# Patient Record
Sex: Female | Born: 1965 | Race: White | Hispanic: No | Smoking: Former smoker
Health system: Southern US, Community
[De-identification: ages and names within clinical notes are randomized; demographics above are authoritative.]

## PROBLEM LIST (undated history)

## (undated) DIAGNOSIS — C189 Malignant neoplasm of colon, unspecified: Secondary | ICD-10-CM

## (undated) DIAGNOSIS — Z8049 Family history of malignant neoplasm of other genital organs: Secondary | ICD-10-CM

## (undated) DIAGNOSIS — Z800 Family history of malignant neoplasm of digestive organs: Secondary | ICD-10-CM

## (undated) DIAGNOSIS — G893 Neoplasm related pain (acute) (chronic): Secondary | ICD-10-CM

## (undated) HISTORY — DX: Malignant neoplasm of colon, unspecified: C18.9

## (undated) HISTORY — DX: Family history of malignant neoplasm of other genital organs: Z80.49

## (undated) HISTORY — DX: Neoplasm related pain (acute) (chronic): G89.3

## (undated) HISTORY — DX: Family history of malignant neoplasm of digestive organs: Z80.0

---

## 2018-09-04 ENCOUNTER — Encounter: Payer: Self-pay | Admitting: Family Medicine

## 2018-09-04 ENCOUNTER — Other Ambulatory Visit: Payer: Self-pay

## 2018-09-04 ENCOUNTER — Ambulatory Visit: Payer: Self-pay | Admitting: Family Medicine

## 2018-09-04 VITALS — BP 112/78 | HR 80 | Temp 97.9°F | Ht 62.2 in | Wt 149.1 lb

## 2018-09-04 DIAGNOSIS — Z7689 Persons encountering health services in other specified circumstances: Secondary | ICD-10-CM

## 2018-09-04 DIAGNOSIS — K529 Noninfective gastroenteritis and colitis, unspecified: Secondary | ICD-10-CM

## 2018-09-04 NOTE — Progress Notes (Signed)
BP 112/78 (BP Location: Right Arm, Patient Position: Sitting, Cuff Size: Normal)   Pulse 80   Temp 97.9 F (36.6 C)   Ht 5' 2.2" (1.58 m)   Wt 149 lb 2 oz (67.6 kg)   SpO2 99%   BMI 27.10 kg/m    Subjective:    Patient ID: Anita Sullivan, female    DOB: 1966/02/15, 53 y.o.   MRN: [id number]  HPI: Anita Sullivan is a 53 y.o. female  Chief Complaint  Patient presents with  . Diarrhea    Extreme gas, painful, blood with mucus.   . Dizziness    Patient states that she get lightheaded at times, not often, but it does happen   Here today to establish care. States she's not had a physical in about 25 years. No known medical problems, not taking any medicines.   Has had issues with diarrhea, gas, bloating, blood and mucus in stools intermittently. Has tried diet changes, dairy seems to not agree with her but when she's cut that out things didn't get better. Has not tried any medications because she's afraid of the counter reaction. Has never had any testing done. Does not have insurance.   Relevant past medical, surgical, family and social history reviewed and updated as indicated. Interim medical history since our last visit reviewed. Allergies and medications reviewed and updated.  Review of Systems  Per HPI unless specifically indicated above     Objective:    BP 112/78 (BP Location: Right Arm, Patient Position: Sitting, Cuff Size: Normal)   Pulse 80   Temp 97.9 F (36.6 C)   Ht 5' 2.2" (1.58 m)   Wt 149 lb 2 oz (67.6 kg)   SpO2 99%   BMI 27.10 kg/m   Wt Readings from Last 3 Encounters:  09/04/18 149 lb 2 oz (67.6 kg)    Physical Exam Vitals signs and nursing note reviewed.  Constitutional:      Appearance: Normal appearance. She is not ill-appearing.  HENT:     Head: Atraumatic.  Eyes:     Extraocular Movements: Extraocular movements intact.     Conjunctiva/sclera: Conjunctivae normal.  Neck:     Musculoskeletal: Normal range of motion and neck supple.    Cardiovascular:     Rate and Rhythm: Normal rate and regular rhythm.     Heart sounds: Normal heart sounds.  Pulmonary:     Effort: Pulmonary effort is normal.     Breath sounds: Normal breath sounds.  Abdominal:     General: Bowel sounds are normal.     Palpations: Abdomen is soft.     Tenderness: There is no abdominal tenderness. There is no right CVA tenderness, left CVA tenderness or guarding.  Musculoskeletal: Normal range of motion.  Skin:    General: Skin is warm and dry.  Neurological:     Mental Status: She is alert and oriented to person, place, and time.  Psychiatric:        Mood and Affect: Mood normal.        Thought Content: Thought content normal.        Judgment: Judgment normal.    No results found for this or any previous visit.    Assessment & Plan:   Problem List Items Addressed This Visit    None    Visit Diagnoses    Chronic diarrhea    -  Primary   Pt concerned about costs. CBC today, referral placed to GI for as conservative a workup  as possible. Due for colonoscopy as well   Relevant Orders   Ambulatory referral to Gastroenterology   CBC with Differential/Platelet   Encounter to establish care           Follow up plan: Return for CPE.

## 2018-09-05 LAB — CBC WITH DIFFERENTIAL/PLATELET
Basophils Absolute: 0 10*3/uL (ref 0.0–0.2)
Basos: 1 %
EOS (ABSOLUTE): 0.2 10*3/uL (ref 0.0–0.4)
Eos: 2 %
Hematocrit: 37.4 % (ref 34.0–46.6)
Hemoglobin: 13 g/dL (ref 11.1–15.9)
Immature Grans (Abs): 0 10*3/uL (ref 0.0–0.1)
Immature Granulocytes: 0 %
Lymphocytes Absolute: 1.9 10*3/uL (ref 0.7–3.1)
Lymphs: 24 %
MCH: 32 pg (ref 26.6–33.0)
MCHC: 34.8 g/dL (ref 31.5–35.7)
MCV: 92 fL (ref 79–97)
Monocytes Absolute: 0.5 10*3/uL (ref 0.1–0.9)
Monocytes: 6 %
NEUTROS PCT: 67 %
Neutrophils Absolute: 5.2 10*3/uL (ref 1.4–7.0)
Platelets: 331 10*3/uL (ref 150–450)
RBC: 4.06 x10E6/uL (ref 3.77–5.28)
RDW: 11.9 % (ref 11.7–15.4)
WBC: 7.8 10*3/uL (ref 3.4–10.8)

## 2018-09-11 ENCOUNTER — Encounter: Payer: Self-pay | Admitting: Family Medicine

## 2018-09-11 ENCOUNTER — Ambulatory Visit (INDEPENDENT_AMBULATORY_CARE_PROVIDER_SITE_OTHER): Payer: Self-pay | Admitting: Family Medicine

## 2018-09-11 NOTE — Progress Notes (Signed)
Used shared decision making today to forgo a physical or OV given self pay status - she is actively pursuing coverage currently and will call back if having any new issues or once she obtains coverage in order to get caught up on health maintenance items.

## 2018-09-15 ENCOUNTER — Encounter: Payer: Self-pay | Admitting: *Deleted

## 2020-05-22 ENCOUNTER — Emergency Department: Payer: Medicaid Other

## 2020-05-22 ENCOUNTER — Encounter: Payer: Self-pay | Admitting: Intensive Care

## 2020-05-22 ENCOUNTER — Inpatient Hospital Stay
Admission: EM | Admit: 2020-05-22 | Discharge: 2020-06-01 | DRG: 357 | Disposition: A | Discharge status: Home / Self Care | Payer: Medicaid Other | Attending: Internal Medicine | Admitting: Internal Medicine

## 2020-05-22 ENCOUNTER — Other Ambulatory Visit: Payer: Self-pay

## 2020-05-22 DIAGNOSIS — Y838 Other surgical procedures as the cause of abnormal reaction of the patient, or of later complication, without mention of misadventure at the time of the procedure: Secondary | ICD-10-CM | POA: Diagnosis not present

## 2020-05-22 DIAGNOSIS — Y92230 Patient room in hospital as the place of occurrence of the external cause: Secondary | ICD-10-CM | POA: Diagnosis not present

## 2020-05-22 DIAGNOSIS — N179 Acute kidney failure, unspecified: Secondary | ICD-10-CM | POA: Diagnosis not present

## 2020-05-22 DIAGNOSIS — C229 Malignant neoplasm of liver, not specified as primary or secondary: Secondary | ICD-10-CM | POA: Diagnosis not present

## 2020-05-22 DIAGNOSIS — D509 Iron deficiency anemia, unspecified: Secondary | ICD-10-CM | POA: Diagnosis present

## 2020-05-22 DIAGNOSIS — D649 Anemia, unspecified: Secondary | ICD-10-CM

## 2020-05-22 DIAGNOSIS — Z515 Encounter for palliative care: Secondary | ICD-10-CM

## 2020-05-22 DIAGNOSIS — K625 Hemorrhage of anus and rectum: Secondary | ICD-10-CM | POA: Diagnosis not present

## 2020-05-22 DIAGNOSIS — E872 Acidosis, unspecified: Secondary | ICD-10-CM

## 2020-05-22 DIAGNOSIS — K922 Gastrointestinal hemorrhage, unspecified: Secondary | ICD-10-CM | POA: Diagnosis not present

## 2020-05-22 DIAGNOSIS — N19 Unspecified kidney failure: Secondary | ICD-10-CM

## 2020-05-22 DIAGNOSIS — N1330 Unspecified hydronephrosis: Secondary | ICD-10-CM

## 2020-05-22 DIAGNOSIS — E8809 Other disorders of plasma-protein metabolism, not elsewhere classified: Secondary | ICD-10-CM

## 2020-05-22 DIAGNOSIS — N139 Obstructive and reflux uropathy, unspecified: Secondary | ICD-10-CM | POA: Diagnosis not present

## 2020-05-22 DIAGNOSIS — C787 Secondary malignant neoplasm of liver and intrahepatic bile duct: Secondary | ICD-10-CM | POA: Diagnosis present

## 2020-05-22 DIAGNOSIS — K529 Noninfective gastroenteritis and colitis, unspecified: Secondary | ICD-10-CM | POA: Diagnosis present

## 2020-05-22 DIAGNOSIS — Z20822 Contact with and (suspected) exposure to covid-19: Secondary | ICD-10-CM | POA: Diagnosis present

## 2020-05-22 DIAGNOSIS — Z452 Encounter for adjustment and management of vascular access device: Secondary | ICD-10-CM | POA: Diagnosis not present

## 2020-05-22 DIAGNOSIS — R160 Hepatomegaly, not elsewhere classified: Secondary | ICD-10-CM

## 2020-05-22 DIAGNOSIS — C19 Malignant neoplasm of rectosigmoid junction: Principal | ICD-10-CM | POA: Diagnosis present

## 2020-05-22 DIAGNOSIS — C189 Malignant neoplasm of colon, unspecified: Secondary | ICD-10-CM

## 2020-05-22 DIAGNOSIS — J95811 Postprocedural pneumothorax: Secondary | ICD-10-CM | POA: Diagnosis not present

## 2020-05-22 DIAGNOSIS — Z95828 Presence of other vascular implants and grafts: Secondary | ICD-10-CM

## 2020-05-22 DIAGNOSIS — Z419 Encounter for procedure for purposes other than remedying health state, unspecified: Secondary | ICD-10-CM

## 2020-05-22 DIAGNOSIS — J939 Pneumothorax, unspecified: Secondary | ICD-10-CM

## 2020-05-22 DIAGNOSIS — C20 Malignant neoplasm of rectum: Secondary | ICD-10-CM | POA: Diagnosis not present

## 2020-05-22 DIAGNOSIS — D62 Acute posthemorrhagic anemia: Secondary | ICD-10-CM | POA: Diagnosis present

## 2020-05-22 DIAGNOSIS — D631 Anemia in chronic kidney disease: Secondary | ICD-10-CM | POA: Diagnosis not present

## 2020-05-22 DIAGNOSIS — N2581 Secondary hyperparathyroidism of renal origin: Secondary | ICD-10-CM | POA: Diagnosis not present

## 2020-05-22 DIAGNOSIS — K6289 Other specified diseases of anus and rectum: Secondary | ICD-10-CM | POA: Diagnosis not present

## 2020-05-22 LAB — COMPREHENSIVE METABOLIC PANEL
ALT: 9 U/L (ref 0–44)
AST: 14 U/L — ABNORMAL LOW (ref 15–41)
Albumin: 3 g/dL — ABNORMAL LOW (ref 3.5–5.0)
Alkaline Phosphatase: 62 U/L (ref 38–126)
Anion gap: 16 — ABNORMAL HIGH (ref 5–15)
BUN: 83 mg/dL — ABNORMAL HIGH (ref 6–20)
CO2: 16 mmol/L — ABNORMAL LOW (ref 22–32)
Calcium: 8.1 mg/dL — ABNORMAL LOW (ref 8.9–10.3)
Chloride: 103 mmol/L (ref 98–111)
Creatinine, Ser: 9.82 mg/dL — ABNORMAL HIGH (ref 0.44–1.00)
GFR, Estimated: 4 mL/min — ABNORMAL LOW (ref >60)
Glucose, Bld: 108 mg/dL — ABNORMAL HIGH (ref 70–99)
Potassium: 4.1 mmol/L (ref 3.5–5.1)
Sodium: 135 mmol/L (ref 135–145)
Total Bilirubin: 0.5 mg/dL (ref 0.3–1.2)
Total Protein: 7.7 g/dL (ref 6.5–8.1)

## 2020-05-22 LAB — CBC
HCT: 20 % — ABNORMAL LOW (ref 36.0–46.0)
Hemoglobin: 6.4 g/dL — ABNORMAL LOW (ref 12.0–15.0)
MCH: 24.2 pg — ABNORMAL LOW (ref 26.0–34.0)
MCHC: 32 g/dL (ref 30.0–36.0)
MCV: 75.8 fL — ABNORMAL LOW (ref 80.0–100.0)
Platelets: 456 10*3/uL — ABNORMAL HIGH (ref 150–400)
RBC: 2.64 MIL/uL — ABNORMAL LOW (ref 3.87–5.11)
RDW: 15.9 % — ABNORMAL HIGH (ref 11.5–15.5)
WBC: 11.4 10*3/uL — ABNORMAL HIGH (ref 4.0–10.5)
nRBC: 0 % (ref 0.0–0.2)

## 2020-05-22 LAB — PROTIME-INR
INR: 1.2 (ref 0.8–1.2)
Prothrombin Time: 14.3 seconds (ref 11.4–15.2)

## 2020-05-22 LAB — RESPIRATORY PANEL BY RT PCR (FLU A&B, COVID)
Influenza A by PCR: NEGATIVE
Influenza B by PCR: NEGATIVE
SARS Coronavirus 2 by RT PCR: NEGATIVE

## 2020-05-22 LAB — LACTIC ACID, PLASMA: Lactic Acid, Venous: 1 mmol/L (ref 0.5–1.9)

## 2020-05-22 LAB — LIPASE, BLOOD: Lipase: 27 U/L (ref 11–51)

## 2020-05-22 LAB — PREPARE RBC (CROSSMATCH)

## 2020-05-22 LAB — ABO/RH: ABO/RH(D): A NEG

## 2020-05-22 MED ORDER — SODIUM CHLORIDE 0.9 % IV BOLUS: Freq: Once | INTRAVENOUS | Status: AC

## 2020-05-22 MED ORDER — PANTOPRAZOLE SODIUM 40 MG IV SOLR
40.0000 mg | Freq: Once | INTRAVENOUS | Status: AC
  Administered 2020-05-22: 40 mg via INTRAVENOUS
  Filled 2020-05-22: qty 40
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
## 2020-05-22 NOTE — ED Notes (Signed)
Assumed care of pt. With MD at bedside for rectal exam.

## 2020-05-22 NOTE — ED Provider Notes (Signed)
[facility] Emergency Department Provider Note  ____________________________________________   First MD Initiated Contact with Patient 05/22/20 1913     (approximate)  I have reviewed the triage vital signs and the nursing notes.   HISTORY  Chief Complaint Rectal Bleeding   HPI Anita Sullivan is a 54 y.o. female without significant past medical history who presents for assessment of bright red bleeding per rectum that has been intermittent over the past 2 months associate with some suprapubic abdominal pain rating to her bilateral flanks.  Patient states she has intermittently been passing bright red blood in her poop over the last couple weeks but that it happened once earlier this week and then again today where she was "gushing bright red blood into the toilet bowl".  She states she suspected she may have IBS that she sometimes has some epigastric discomfort after meals.  She denies significant NSAID use, EtOH use, tobacco abuse, illegal drug use.  Denies being anticoagulated.  No prior similar episodes.  No clear alleviating or aggravating factors.  She denies any headache, earache, sore throat, headedness, dizziness, chest pain, cough, shortness of breath, upper back pain, burning with urination, blood in her urine, rash, extremity pain, other acute complaints.  She denies any renal or GI history.         History reviewed. No pertinent past medical history.  Patient Active Problem List   Diagnosis Date Noted  . GI bleed 05/22/2020    History reviewed. No pertinent surgical history.  Prior to Admission medications   Medication Sig Start Date End Date Taking? Authorizing Provider  acetaminophen (TYLENOL) 500 MG tablet Take 1,000 mg by mouth every 6 (six) hours as needed.    [provider]    Allergies Patient has no known allergies.  Family History  Problem Relation Age of Onset  . Other Mother        Corticobasal degeneration  .  Cancer Mother        Uterine  . Diabetes Maternal Grandmother   . Stroke Maternal Grandfather   . Stroke Paternal Grandfather   . Heart disease Father     Social History Social History   Tobacco Use  . Smoking status: Never Smoker  . Smokeless tobacco: Never Used  Substance Use Topics  . Alcohol use: Never  . Drug use: Never    Review of Systems  Review of Systems  Constitutional: Negative for chills and fever.  HENT: Negative for sore throat.   Eyes: Negative for pain.  Respiratory: Negative for cough and stridor.   Cardiovascular: Negative for chest pain.  Gastrointestinal: Positive for abdominal pain and blood in stool. Negative for vomiting.  Genitourinary: Positive for flank pain. Negative for dysuria.  Musculoskeletal: Positive for back pain.  Skin: Negative for rash.  Neurological: Negative for seizures, loss of consciousness and headaches.  Psychiatric/Behavioral: Negative for suicidal ideas.  All other systems reviewed and are negative.     ____________________________________________   PHYSICAL EXAM:  VITAL SIGNS: ED Triage Vitals  Enc Vitals Group     BP 05/22/20 1536 132/72     Pulse Rate 05/22/20 1536 93     Resp 05/22/20 1536 20     Temp 05/22/20 1536 98.8 F (37.1 C)     Temp Source 05/22/20 1536 Oral     SpO2 05/22/20 1536 100 %     Weight 05/22/20 1537 130 lb (59 kg)     Height 05/22/20 1537 5' (1.524 m)  Head Circumference --      Peak Flow --      Pain Score 05/22/20 1539 7     Pain Loc --      Pain Edu? --      Excl. in [place]? --    Vitals:   05/22/20 2037 05/22/20 2100  BP: 124/69 124/79  Pulse: 87 86  Resp: 16 16  Temp: 98.4 F (36.9 C) 98.5 F (36.9 C)  SpO2: 100% 100%   Physical Exam Vitals and nursing note reviewed. Exam conducted with a chaperone present.  Constitutional:      General: She is not in acute distress.    Appearance: She is well-developed.  HENT:     Head: Normocephalic and atraumatic.     Right Ear:  External ear normal.     Left Ear: External ear normal.     Nose: Nose normal.  Eyes:     Conjunctiva/sclera: Conjunctivae normal.  Cardiovascular:     Rate and Rhythm: Normal rate and regular rhythm.     Heart sounds: No murmur heard.   Pulmonary:     Effort: Pulmonary effort is normal. No respiratory distress.     Breath sounds: Normal breath sounds.  Abdominal:     Palpations: Abdomen is soft.     Tenderness: There is abdominal tenderness in the suprapubic area. There is no right CVA tenderness or left CVA tenderness.  Genitourinary:    Rectum: No anal fissure, external hemorrhoid or internal hemorrhoid.     Comments: There is some bright red blood in the rectal vault.  No clear source on rectal exam. Musculoskeletal:     Cervical back: Neck supple.  Skin:    General: Skin is warm and dry.     Capillary Refill: Capillary refill takes less than 2 seconds.  Neurological:     Mental Status: She is alert and oriented to person, place, and time.  Psychiatric:        Mood and Affect: Mood normal.      ____________________________________________   LABS (all labs ordered are listed, but only abnormal results are displayed)  Labs Reviewed  COMPREHENSIVE METABOLIC PANEL - Abnormal; Notable for the following components:      Result Value   CO2 16 (*)    Glucose, Bld 108 (*)    BUN 83 (*)    Creatinine, Ser 9.82 (*)    Calcium 8.1 (*)    Albumin 3.0 (*)    AST 14 (*)    GFR, Estimated 4 (*)    Anion gap 16 (*)    All other components within normal limits  CBC - Abnormal; Notable for the following components:   WBC 11.4 (*)    RBC 2.64 (*)    Hemoglobin 6.4 (*)    HCT 20.0 (*)    MCV 75.8 (*)    MCH 24.2 (*)    RDW 15.9 (*)    Platelets 456 (*)    All other components within normal limits  RESPIRATORY PANEL BY RT PCR (FLU A&B, COVID)  GASTROINTESTINAL PANEL BY PCR, STOOL (REPLACES STOOL CULTURE)  C DIFFICILE QUICK SCREEN W PCR REFLEX  LACTIC ACID, PLASMA    LIPASE, BLOOD  LACTIC ACID, PLASMA  URINALYSIS, COMPLETE (UACMP) WITH MICROSCOPIC  PROTIME-INR  POC OCCULT BLOOD, ED  TYPE AND SCREEN  PREPARE RBC (CROSSMATCH)  ABO/RH   ____________________________________________  ____________________________________  RADIOLOGY   Official radiology report(s): CT ABDOMEN PELVIS WO CONTRAST  Result Date: 05/22/2020 CLINICAL DATA:  Intermittent rectal bleeding. EXAM: CT ABDOMEN AND PELVIS WITHOUT CONTRAST TECHNIQUE: Multidetector CT imaging of the abdomen and pelvis was performed following the standard protocol without IV contrast. COMPARISON:  None. FINDINGS: Lower chest: No acute abnormality. Hepatobiliary: A 2.9 cm x 3.1 cm ill-defined area of heterogeneous low attenuation is seen within the lateral aspect of the liver dome (axial CT image 17, CT series number 2). An ill-defined, slightly exophytic 6.9 cm x 5.0 cm area of heterogeneous mildly decreased attenuation is seen within the posterior aspect of the right lobe of the liver. An ill-defined central area of low attenuation is noted within this region. No gallstones, gallbladder wall thickening, or biliary dilatation. Pancreas: Unremarkable. No pancreatic ductal dilatation or surrounding inflammatory changes. Spleen: Normal in size without focal abnormality. Adrenals/Urinary Tract: Adrenal glands are unremarkable. Kidneys are normal in size, without focal lesions. Marked severity bilateral hydronephrosis and hydroureter is seen. The urinary bladder is partially contracted and subsequently limited in evaluation. Stomach/Bowel: Stomach is within normal limits. Appendix appears normal. No evidence of bowel dilatation. There is marked severity thickening of the mid and distal sigmoid colon associated pericolonic inflammatory fat stranding is noted. Vascular/Lymphatic: No significant vascular findings are present. There is moderate severity para-aortic and aortocaval lymphadenopathy. The largest lymph node  measures approximately 1.7 cm x 1.7 cm along the para-aortic region at the level of the kidney. It should be noted that this area is limited in evaluation in the absence of intravenous contrast. A 2.8 cm x 2.4 cm heterogeneous soft tissue mass is seen along the posterior aspect of the distal left iliac vessels (axial CT images 68 through 74, CT series number 2). Numerous subcentimeter soft tissue nodules are seen within the mesentery of the pelvis, along the midline (axial CT images 58 through 66, CT series number 2). Numerous additional small soft tissue nodules are seen surrounding the distal sigmoid colon and rectosigmoid junction. Reproductive: The uterus is mildly enlarged. The uterine fundus is positioned to the left of midline within the anterior aspect of the pelvis. Other: No abdominal wall hernia or abnormality. A small amount of abdominal and pelvic fluid is noted. Musculoskeletal: No acute or significant osseous findings. IMPRESSION: 1. Marked severity sigmoid colitis. Due to the numerous areas of soft tissue nodularity within the mesentery in this region, an underlying neoplastic process cannot be excluded. Correlation with colonoscopy is recommended. 2. Heterogeneous liver masses, as described above, suspicious for an underlying neoplasm. MRI correlation is recommended. 3. Moderate severity para-aortic and aortocaval lymphadenopathy with an additional enlarged, irregular appearing lymph node seen along the posterior aspect of the distal left iliac vessels. This is also worrisome for sequelae associated with an underlying neoplasm. 4. Marked severity bilateral hydronephrosis and hydroureter which likely represents partial renal obstruction secondary to the inflammatory and suspected neoplastic process seen within the pelvis. Electronically Signed   By: Virgina Norfolk M.D.   On: 05/22/2020 20:26    ____________________________________________   PROCEDURES  Procedure(s) performed (including  Critical Care):  .Critical Care Performed by: Lucrezia Starch, MD Authorized by: Lucrezia Starch, MD   Critical care provider statement:    Critical care time (minutes):  45   Critical care time was exclusive of:  Separately billable procedures and treating other patients   Critical care was necessary to treat or prevent imminent or life-threatening deterioration of the following conditions:  Metabolic crisis (Gi bleed requiring blood transfusion )   Critical care was time spent personally by me on the following activities:  Discussions with consultants, evaluation of patient's response to treatment, examination of patient, ordering and performing treatments and interventions, ordering and review of laboratory studies, ordering and review of radiographic studies, pulse oximetry, re-evaluation of patient's condition, obtaining history from patient or surrogate and review of old charts     ____________________________________________   [place] / [place] / ED COURSE        Patient presents with past history exam for evaluation of bright red blood per rectum as well as some suprapubic discomfort rating to her back.  Patient is afebrile hemodynamically stable on arrival.  Exam as above remarkable for some suprapubic tenderness as well as bright red blood in the rectum without active bleeding.  Differential includes but is not limited to bleeding hemorrhoid, fissure, AVM or formation, bleeding diverticuli, polyp, malignancy.  Patient's back pain could also be related to pyelonephritis, kidney stone, and renal failure.  CBC obtained in triage remarkable for evidence of acute renal failure with a creatinine of 9.82 without any recent to compare to.  Patient also is noted to have an anion gap metabolic acidosis with bicarb of 16 and anion gap of 16.  This is likely related to her uremia with BUN of 83.  Patient has noted hypoalbumin anemic but otherwise has no significant  electrolyte or metabolic derangements.  CBC remarkable for hemoglobin of 6.4 compared to 13 obtained 1 year ago.  Patient has also noted to have an elevated white blood cell count 11.4 with unremarkable platelets.  Lactic acid is 1.  Lipase is 27.  CT findings concerning for likely malignant process causing severe colitis likely etiology of patient's lower GI bleeding.  In addition she is noted to have severe bilateral hydronephrosis likely causing significant postobstructive uropathy which is likely the etiology for her acute renal failure.  Patient potassium is within normal limits I suspect all of the above noted findings and CT have been progressing over several months.  Do not believe she requires emergent dialysis at this time.  Also have lower suspicion for acute infectious process at this time although we will also order urine and stool studies.  Given patient's hemoglobin of less than 7 she was transfused 1 unit of packed red blood cells while in the ED.  I will plan to admit to medicine service for further evaluation management.  ____________________________________________   FINAL CLINICAL IMPRESSION(S) / ED DIAGNOSES  Final diagnoses:  Gastrointestinal hemorrhage, unspecified gastrointestinal hemorrhage type  Malignant neoplasm of colon, unspecified part of colon ([place])  Acute renal failure, unspecified acute renal failure type ([place])  Obstructive uropathy  Uremia  Low hemoglobin  Metabolic acidosis  Hypoalbuminemia    Medications  sodium chloride 0.9 % bolus ( Intravenous New Bag/Given 05/22/20 2043)  pantoprazole (PROTONIX) injection 40 mg (40 mg Intravenous Given 05/22/20 1948)     ED Discharge Orders    None       Note:  This document was prepared using Dragon voice recognition software and may include unintentional dictation errors.   Lucrezia Starch, MD 05/22/20 2112

## 2020-05-22 NOTE — ED Triage Notes (Signed)
Patient reports intermittent rectal bleeding X2 months. Also c/o abdominal pain and radiates around to back

## 2020-05-23 ENCOUNTER — Ambulatory Visit
Admit: 2020-05-23 | Discharge: 2020-05-23 | Disposition: A | Discharge status: Home / Self Care | Payer: Medicaid Other | Attending: Internal Medicine | Admitting: Internal Medicine

## 2020-05-23 ENCOUNTER — Inpatient Hospital Stay: Payer: Medicaid Other

## 2020-05-23 DIAGNOSIS — E872 Acidosis, unspecified: Secondary | ICD-10-CM | POA: Insufficient documentation

## 2020-05-23 DIAGNOSIS — K922 Gastrointestinal hemorrhage, unspecified: Secondary | ICD-10-CM

## 2020-05-23 DIAGNOSIS — N1330 Unspecified hydronephrosis: Secondary | ICD-10-CM

## 2020-05-23 HISTORY — PX: IR NEPHROSTOMY PLACEMENT LEFT: IMG6063

## 2020-05-23 HISTORY — PX: IR NEPHROSTOMY PLACEMENT RIGHT: IMG6064

## 2020-05-23 LAB — BASIC METABOLIC PANEL
Anion gap: 15 (ref 5–15)
BUN: 87 mg/dL — ABNORMAL HIGH (ref 6–20)
CO2: 16 mmol/L — ABNORMAL LOW (ref 22–32)
Calcium: 8.3 mg/dL — ABNORMAL LOW (ref 8.9–10.3)
Chloride: 107 mmol/L (ref 98–111)
Creatinine, Ser: 10.74 mg/dL — ABNORMAL HIGH (ref 0.44–1.00)
GFR, Estimated: 4 mL/min — ABNORMAL LOW (ref >60)
Glucose, Bld: 100 mg/dL — ABNORMAL HIGH (ref 70–99)
Potassium: 4 mmol/L (ref 3.5–5.1)
Sodium: 138 mmol/L (ref 135–145)

## 2020-05-23 LAB — IRON AND TIBC
Iron: 45 ug/dL (ref 28–170)
Saturation Ratios: 20 % (ref 10.4–31.8)
TIBC: 223 ug/dL — ABNORMAL LOW (ref 250–450)
UIBC: 178 ug/dL

## 2020-05-23 LAB — URINALYSIS, COMPLETE (UACMP) WITH MICROSCOPIC
Bilirubin Urine: NEGATIVE
Glucose, UA: NEGATIVE mg/dL
Ketones, ur: NEGATIVE mg/dL
Nitrite: NEGATIVE
Protein, ur: 30 mg/dL — AB
Specific Gravity, Urine: 1.008 (ref 1.005–1.030)
pH: 5 (ref 5.0–8.0)

## 2020-05-23 LAB — CBC
HCT: 21.4 % — ABNORMAL LOW (ref 36.0–46.0)
Hemoglobin: 6.9 g/dL — ABNORMAL LOW (ref 12.0–15.0)
MCH: 24.8 pg — ABNORMAL LOW (ref 26.0–34.0)
MCHC: 32.2 g/dL (ref 30.0–36.0)
MCV: 77 fL — ABNORMAL LOW (ref 80.0–100.0)
Platelets: 425 10*3/uL — ABNORMAL HIGH (ref 150–400)
RBC: 2.78 MIL/uL — ABNORMAL LOW (ref 3.87–5.11)
RDW: 16.5 % — ABNORMAL HIGH (ref 11.5–15.5)
WBC: 9.8 10*3/uL (ref 4.0–10.5)
nRBC: 0 % (ref 0.0–0.2)

## 2020-05-23 LAB — RETICULOCYTES
Immature Retic Fract: 21.6 % — ABNORMAL HIGH (ref 2.3–15.9)
RBC.: 2.77 MIL/uL — ABNORMAL LOW (ref 3.87–5.11)
Retic Count, Absolute: 23.5 10*3/uL (ref 19.0–186.0)
Retic Ct Pct: 0.9 % (ref 0.4–3.1)

## 2020-05-23 LAB — LACTIC ACID, PLASMA: Lactic Acid, Venous: 0.6 mmol/L (ref 0.5–1.9)

## 2020-05-23 LAB — FERRITIN: Ferritin: 58 ng/mL (ref 11–307)

## 2020-05-23 LAB — FOLATE: Folate: 7.9 ng/mL (ref >5.9)

## 2020-05-23 LAB — VITAMIN B12: Vitamin B-12: 529 pg/mL (ref 180–914)

## 2020-05-23 LAB — HIV ANTIBODY (ROUTINE TESTING W REFLEX): HIV Screen 4th Generation wRfx: NONREACTIVE

## 2020-05-23 MED ORDER — PANTOPRAZOLE SODIUM 40 MG IV SOLR
40.0000 mg | Freq: Two times a day (BID) | INTRAVENOUS | Status: DC
  Administered 2020-05-23 – 2020-05-31 (×16): 40 mg via INTRAVENOUS
  Filled 2020-05-23 (×17): qty 40

## 2020-05-23 MED ORDER — CEFAZOLIN SODIUM-DEXTROSE 2-4 GM/100ML-% IV SOLN
2.0000 g | INTRAVENOUS | Status: AC
  Filled 2020-05-23: qty 100

## 2020-05-23 MED ORDER — SODIUM CHLORIDE 0.9% FLUSH
5.0000 mL | Freq: Three times a day (TID) | INTRAVENOUS | Status: DC
  Administered 2020-05-23: 5 mL

## 2020-05-23 MED ORDER — FENTANYL CITRATE (PF) 100 MCG/2ML IJ SOLN
INTRAMUSCULAR | Status: AC
  Filled 2020-05-23: qty 2

## 2020-05-23 MED ORDER — MIDAZOLAM HCL 5 MG/5ML IJ SOLN
INTRAMUSCULAR | Status: AC
  Filled 2020-05-23: qty 5

## 2020-05-23 MED ORDER — ONDANSETRON HCL 4 MG PO TABS: 4.0000 mg | ORAL_TABLET | Freq: Four times a day (QID) | ORAL | Status: DC | PRN

## 2020-05-23 MED ORDER — ONDANSETRON HCL 4 MG/2ML IJ SOLN
4.0000 mg | Freq: Four times a day (QID) | INTRAMUSCULAR | Status: DC | PRN
  Administered 2020-05-24 – 2020-05-29 (×3): 4 mg via INTRAVENOUS
  Filled 2020-05-23 (×3): qty 2

## 2020-05-23 MED ORDER — LACTATED RINGERS IV SOLN
INTRAVENOUS | Status: DC
  Administered 2020-05-26: 1000 mL via INTRAVENOUS

## 2020-05-23 MED ORDER — MIDAZOLAM HCL 2 MG/2ML IJ SOLN
INTRAMUSCULAR | Status: AC | PRN
  Administered 2020-05-23 (×4): 1 mg via INTRAVENOUS

## 2020-05-23 MED ORDER — FENTANYL CITRATE (PF) 100 MCG/2ML IJ SOLN
INTRAMUSCULAR | Status: AC | PRN
  Administered 2020-05-23 (×2): 50 ug via INTRAVENOUS

## 2020-05-23 MED ORDER — CEFAZOLIN SODIUM-DEXTROSE 2-4 GM/100ML-% IV SOLN
INTRAVENOUS | Status: AC
  Filled 2020-05-23: qty 100

## 2020-05-23 MED ORDER — CEFAZOLIN SODIUM-DEXTROSE 1-4 GM/50ML-% IV SOLN
INTRAVENOUS | Status: AC | PRN
  Administered 2020-05-23: 2 g via INTRAVENOUS

## 2020-05-23 MED ORDER — IODIXANOL 320 MG/ML IV SOLN
50.0000 mL | Freq: Once | INTRAVENOUS | Status: AC | PRN
  Administered 2020-05-23: 12:00:00 20 mL

## 2020-05-23 NOTE — ED Notes (Signed)
Pt resting comfortably at this time. Pt ate dinner with no difficulty. Pt denies any further needs at this time. No distress noted. Pt states pain is tolerable and describes it as sore. Daughter at bedside.

## 2020-05-23 NOTE — Progress Notes (Signed)
Patient clinically stable post Bilateral Nephrostomy tube placement per DR Pascal Lux, tolerated well. Vitals stable pre and post procedure.awake/alert and oriented post procedure. 10 FR bilateral tubes placed. Denies complaints at this time. Received Versed 4 mg along with Fentanyl 100 mcg IV for procedure. Report given to [facility] post procedure in specials. To return to ER to await bed placement for admission post recovery.

## 2020-05-23 NOTE — ED Notes (Signed)
Report to [facility]

## 2020-05-23 NOTE — ED Notes (Signed)
Pt resting comfortably at this time. NAD noted at this time. Call bell in reach.

## 2020-05-23 NOTE — ED Notes (Signed)
Pt taken to vascular for placement of bilateral neprh tubes.

## 2020-05-23 NOTE — ED Notes (Signed)
[place] at bedside for renal US. Daughter also at bedside.
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
## 2020-05-23 NOTE — ED Notes (Signed)
Pt changed pad and there was some clots noted. Pt denies BD pain or the feeling that she is having a rush from the rectal area.

## 2020-05-23 NOTE — ED Notes (Signed)
Pt assisted to restroom with minimal assist at this time. 824ml of blood tinged urine emptied from neph tubes.

## 2020-05-23 NOTE — ED Notes (Signed)
Pt is back at baseline. Pt states she would like to eat and/or drink something, MD states she can now eat. VSS. Daughter at bedside.

## 2020-05-23 NOTE — Consult Note (Signed)
Jonathon Bellows , MD [address] [place], [place], [place], [address] [address], [place], [place], [place], [address] Phone: [phone]  Fax: [phone]  Consultation  Referring Provider:   Dr Reesa Chew Primary Care Physician:  Volney American, [place] Primary Gastroenterologist:  None          Reason for Consultation:     GI bleed  Date of Admission:  05/22/2020 Date of Consultation:  05/23/2020         HPI:   Anita Sullivan is a 54 y.o. female admitted last night with rectal bleeding.  Ongoing for 3 months on and off.The emergency room her hemoglobin was noted to be 6.4 g with an MCV of 75.8 and a CMP demonstrated normal liver function tests but a creatinine of 9.82 and a CO2 of 16.Large amount of blood and leukocytes in the urine.This morning hemoglobin is yet to be checked  She Underwent a CT scan of the abdomen and pelvis that demonstrated marked severe sigmoid colitis numerous, numerous areas of soft tissue nodularity within the mesentery suggestive of an underlying neoplastic process that cannot be excluded heterogeneous liver masses noted suspicious for a neoplasm.  Moderately severe Faera aortic and aortocaval lymphadenopathy noted marked severe bilateral hydronephrosis and hydroureter representing partial renal obstruction.  She says that she has had rectal bleeding and bright red blood in her urine for a few months but never sought medical attention, has lost a lot of weight and has no family  history of cancer.  History reviewed. No pertinent past medical history.  History reviewed. No pertinent surgical history.  Prior to Admission medications   Medication Sig Start Date End Date Taking? Authorizing Provider  acetaminophen (TYLENOL) 500 MG tablet Take 1,000 mg by mouth every 6 (six) hours as needed. Patient not taking: Reported on 05/22/2020    [provider]    Family History  Problem Relation Age of Onset   Other Mother        Corticobasal  degeneration   Cancer Mother        Uterine   Diabetes Maternal Grandmother    Stroke Maternal Grandfather    Stroke Paternal Grandfather    Heart disease Father      Social History   Tobacco Use   Smoking status: Never Smoker   Smokeless tobacco: Never Used  Substance Use Topics   Alcohol use: Never   Drug use: Never    Allergies as of 05/22/2020   (No Known Allergies)    Review of Systems:    All systems reviewed and negative except where noted in HPI.   Physical Exam:  Vital signs in last 24 hours: Temp:  [98.4 F (36.9 C)-98.9 F (37.2 C)] 98.9 F (37.2 C) (10/21 2330) Pulse Rate:  [74-93] 78 (10/22 0800) Resp:  [16-20] 16 (10/21 2330) BP: (95-140)/(41-79) 95/58 (10/22 0800) SpO2:  [98 %-100 %] 98 % (10/22 0800) Weight:  [59 kg] 59 kg (10/21 1537)   General:   Pleasant, cooperative in NAD Head:  Normocephalic and atraumatic. Eyes:   No icterus.   Conjunctiva pink. PERRLA. Ears:  Normal auditory acuity. Neck:  Supple; no masses or thyroidomegaly Lungs: Respirations even and unlabored. Lungs clear to auscultation bilaterally.   No wheezes, crackles, or rhonchi.  Heart:  Regular rate and rhythm;  Without murmur, clicks, rubs or gallops Abdomen:  Soft, nondistended, mild generarlized tenderness, Normal bowel sounds. No appreciable masses or hepatomegaly.  No rebound or guarding.  Neurologic:  Alert and oriented x3;  grossly normal neurologically. Skin:  Intact without significant lesions or rashes. Cervical Nodes:  No significant cervical adenopathy. Psych:  Alert and cooperative. Normal affect.  LAB RESULTS: Recent Labs    05/22/20 1542  WBC 11.4*  HGB 6.4*  HCT 20.0*  PLT 456*   BMET Recent Labs    05/22/20 1542  NA 135  K 4.1  CL 103  CO2 16*  GLUCOSE 108*  BUN 83*  CREATININE 9.82*  CALCIUM 8.1*   LFT Recent Labs    05/22/20 1542  PROT 7.7  ALBUMIN 3.0*  AST 14*  ALT 9  ALKPHOS 62  BILITOT 0.5   PT/INR Recent Labs     05/22/20 2313  LABPROT 14.3  INR 1.2    STUDIES: CT ABDOMEN PELVIS WO CONTRAST  Result Date: 05/22/2020 CLINICAL DATA:  Intermittent rectal bleeding. EXAM: CT ABDOMEN AND PELVIS WITHOUT CONTRAST TECHNIQUE: Multidetector CT imaging of the abdomen and pelvis was performed following the standard protocol without IV contrast. COMPARISON:  None. FINDINGS: Lower chest: No acute abnormality. Hepatobiliary: A 2.9 cm x 3.1 cm ill-defined area of heterogeneous low attenuation is seen within the lateral aspect of the liver dome (axial CT image 17, CT series number 2). An ill-defined, slightly exophytic 6.9 cm x 5.0 cm area of heterogeneous mildly decreased attenuation is seen within the posterior aspect of the right lobe of the liver. An ill-defined central area of low attenuation is noted within this region. No gallstones, gallbladder wall thickening, or biliary dilatation. Pancreas: Unremarkable. No pancreatic ductal dilatation or surrounding inflammatory changes. Spleen: Normal in size without focal abnormality. Adrenals/Urinary Tract: Adrenal glands are unremarkable. Kidneys are normal in size, without focal lesions. Marked severity bilateral hydronephrosis and hydroureter is seen. The urinary bladder is partially contracted and subsequently limited in evaluation. Stomach/Bowel: Stomach is within normal limits. Appendix appears normal. No evidence of bowel dilatation. There is marked severity thickening of the mid and distal sigmoid colon associated pericolonic inflammatory fat stranding is noted. Vascular/Lymphatic: No significant vascular findings are present. There is moderate severity para-aortic and aortocaval lymphadenopathy. The largest lymph node measures approximately 1.7 cm x 1.7 cm along the para-aortic region at the level of the kidney. It should be noted that this area is limited in evaluation in the absence of intravenous contrast. A 2.8 cm x 2.4 cm heterogeneous soft tissue mass is seen along the  posterior aspect of the distal left iliac vessels (axial CT images 68 through 74, CT series number 2). Numerous subcentimeter soft tissue nodules are seen within the mesentery of the pelvis, along the midline (axial CT images 58 through 66, CT series number 2). Numerous additional small soft tissue nodules are seen surrounding the distal sigmoid colon and rectosigmoid junction. Reproductive: The uterus is mildly enlarged. The uterine fundus is positioned to the left of midline within the anterior aspect of the pelvis. Other: No abdominal wall hernia or abnormality. A small amount of abdominal and pelvic fluid is noted. Musculoskeletal: No acute or significant osseous findings. IMPRESSION: 1. Marked severity sigmoid colitis. Due to the numerous areas of soft tissue nodularity within the mesentery in this region, an underlying neoplastic process cannot be excluded. Correlation with colonoscopy is recommended. 2. Heterogeneous liver masses, as described above, suspicious for an underlying neoplasm. MRI correlation is recommended. 3. Moderate severity para-aortic and aortocaval lymphadenopathy with an additional enlarged, irregular appearing lymph node seen along the posterior aspect of the distal left iliac vessels. This is also worrisome  for sequelae associated with an underlying neoplasm. 4. Marked severity bilateral hydronephrosis and hydroureter which likely represents partial renal obstruction secondary to the inflammatory and suspected neoplastic process seen within the pelvis. Electronically Signed   By: Virgina Norfolk M.D.   On: 05/22/2020 20:26      Impression / Plan:   Anita Sullivan is a 54 y.o. y/o female I have asked to be seen for rectal bleeding.  Ongoing for over a few weeks.  Hemoglobin noted to be low with severe microcytosis indicating probably iron deficiency that suggest that this issue has been going on for a while probably weeks if not any longer.  Large amount of blood seen in the  urine as well.  CT scan suggestive of some form of metastatic neoplasm,   Unknown primary.  Also has bilateral hydronephrosis likely secondary to obstruction from metastasis, she had a metabolic acidosis and acute kidney injury.  Plan 1.  Check iron studies B12 folate and replace if low I suspect she will have severe iron deficiency replace iron  2.  Suggest to consult oncology.  We can consider a sigmoidoscopy once she is more stable with improvement in her AKI and metabolic acidosis.  If she is having acute severe bleeding in the interim would suggest intervention by vascular surgery if severe as it is most likely due to underlying possible malignant disease that is causing the bleed. 3.  Monitor CBC and transfuse as needed.  Thank you for involving me in the care of this patient.      LOS: 1 day   Jonathon Bellows, MD  05/23/2020, 9:46 AM

## 2020-05-23 NOTE — Procedures (Signed)
Pre Procedure Dx: Hydronephrosis Post Procedural Dx: Same  Successful [place] and fluoroscopic guided placement of a bilateral 10 Fr PCNs with end coiled and locked within the renal pelvis. Both PCNs connected to gravity bags.  EBL: Trace Complications: None immediate.  Ronny Bacon, MD Pager #: [phone]

## 2020-05-23 NOTE — ED Notes (Signed)
Patient assessed. Noted to be resting in bed and declined desire for pain medication at this time though pt is reporting soreness. Pt is lying on L side. Cardiac monitor is in place. Pt breathing easy and unlabored with symmetric chest rise and fall and pt is speaking in full sentences. Patient is alert and oriented x4 and follows commands appropriately. IVF are infusing. Patient's bed is low and locked with side rails raised x1 and call bell in reach. Patient is deemed a low fall risk and verbalizes she will ask for assistance when needed for the restroom. Patient's meal tray was at bedside upon RN arrival in room and tray was placed at R side of bed at level that patient can reach. RN ensured patients belongings are in reach as well. Pt noted to have bilateral nephrostomy tubes in place both of which are draining with pink tinged output. No clots noted at this time. Patient denies further needs at this time and states she will alert RN when pain medication is desired.

## 2020-05-23 NOTE — Progress Notes (Signed)
Pt returned to holding w bilat nephrostomy tubes. Blood tinged urine draining through patent  collection bags R and L.

## 2020-05-23 NOTE — ED Notes (Signed)
Report received and care assumed. Per report overridden medications- Versed, Fentanyl, and Ancef were administered while patient off the floor for nephrostomy tube placement. Medications remain undocumented in [facility].

## 2020-05-23 NOTE — Progress Notes (Addendum)
PROGRESS NOTE    Anita Sullivan  HYW:[id number] DOB: 1966-04-21 DOA: 05/22/2020 PCP: Volney American, PA-C   Brief Narrative:  Anita Sullivan is a 54 y.o. female admitted last night with rectal bleeding.  Ongoing for 3 months on and off.The emergency room her hemoglobin was noted to be 6.4 g with an MCV of 75.8. Creatinine of 9.82 and CO2 of 16. Large amount of blood and leukocytes in the urine. CT scan of the abdomen and pelvis that demonstrated marked severe sigmoid colitis numerous, numerous areas of soft tissue nodularity within the mesentery suggestive of an underlying neoplastic process that cannot be excluded heterogeneous liver masses noted suspicious for a neoplasm.  Moderately severe [place] aortic and aortocaval lymphadenopathy noted marked severe bilateral hydronephrosis and hydroureter representing partial renal obstruction. High concern for extensive malignancy. Patient has quite a bit of unintentional weight loss recently. No family history of malignancy. Multiple specialities were consulted this morning which include GI, urology, nephrology and oncology. She underwent emergent bilateral nephrostomy tube placement by IR and draining well now.   Subjective: Patient continued to feel mild diffuse abdominal pain and bloatedness. Also having some heaviness around CVA bilaterally. She was very concerned about the cost of this visit as she does not have insurance, recently enrolled with some insurance but it will not start till the next year.  Assessment & Plan:   Principal Problem:   GI bleed Active Problems:   AKI (acute kidney injury) ([place])   Obstructive uropathy   Colitis, acute  Lower GI bleed. Symptoms and imaging is concerning for extensive malignancy. Patient had unintentional weight loss. She was transfused with 2 units of PRBCs. No posttransfusion CBC yet. -Consult was placed for GI and oncology. -Monitor CBC. -Check anemia panel. -Transfuse if below  8.  Acute kidney injury. Most likely secondary to obstructive uropathy as evident on CT abdomen. Urology was also consulted and according to their advice IR was consulted for bilateral emergent nephrostomy tube placement which was done today. Patient draining well from nephrostomy tubes. Urology will see her later today. Nephrology consult was also placed. Monitor renal function. Avoid nephrotoxins.  Anion gap metabolic acidosis. Lactic acid within normal limit. Most likely secondary to severe acute AKI and GI bleed. Mentation okay. -Continue to monitor.  Objective: Vitals:   05/23/20 0330 05/23/20 0400 05/23/20 0800 05/23/20 1006  BP: 133/67 130/65 (!) 95/58 122/68  Pulse: 74 76 78 88  Resp:      Temp:      TempSrc:      SpO2: 99% 99% 98% 100%  Weight:      Height:        Intake/Output Summary (Last 24 hours) at 05/23/2020 1511 Last data filed at 05/22/2020 2040 Gross per 24 hour  Intake 290 ml  Output --  Net 290 ml   Filed Weights   05/22/20 1537  Weight: 59 kg    Examination:  General exam: Appears calm and comfortable  Respiratory system: Clear to auscultation. Respiratory effort normal. Cardiovascular system: S1 & S2 heard, RRR.  Gastrointestinal system: Soft, mild diffuse tenderness, nondistended, bowel sounds positive. Central nervous system: Alert and oriented. No focal neurological deficits.Symmetric 5 x 5 power. Extremities: No edema, no cyanosis, pulses intact and symmetrical. Psychiatry: Judgement and insight appear normal. Mood & affect appropriate.    DVT prophylaxis: SCDs Code Status: Full Family Communication: Discussed with patient. Who agrees to talk with daughter. Call daughter with no response. Disposition Plan:  Status is: Inpatient  Remains inpatient appropriate because:Inpatient level of care appropriate due to severity of illness   Dispo: The patient is from: Home              Anticipated d/c is to: Home              Anticipated d/c  date is: 3 days              Patient currently is not medically stable to d/c.   Consultants:   GI  IR  Oncology  Urology  Nephrology  Procedures:  Antimicrobials:   Data Reviewed: I have personally reviewed following labs and imaging studies  CBC: Recent Labs  Lab 05/22/20 1542  WBC 11.4*  HGB 6.4*  HCT 20.0*  MCV 75.8*  PLT 725*   Basic Metabolic Panel: Recent Labs  Lab 05/22/20 1542  NA 135  K 4.1  CL 103  CO2 16*  GLUCOSE 108*  BUN 83*  CREATININE 9.82*  CALCIUM 8.1*   GFR: Estimated Creatinine Clearance: 5.4 mL/min (A) (by C-G formula based on SCr of 9.82 mg/dL (H)). Liver Function Tests: Recent Labs  Lab 05/22/20 1542  AST 14*  ALT 9  ALKPHOS 62  BILITOT 0.5  PROT 7.7  ALBUMIN 3.0*   Recent Labs  Lab 05/22/20 1934  LIPASE 27   No results for input(s): AMMONIA in the last 168 hours. Coagulation Profile: Recent Labs  Lab 05/22/20 2313  INR 1.2   Cardiac Enzymes: No results for input(s): CKTOTAL, CKMB, CKMBINDEX, TROPONINI in the last 168 hours. BNP (last 3 results) No results for input(s): PROBNP in the last 8760 hours. HbA1C: No results for input(s): HGBA1C in the last 72 hours. CBG: No results for input(s): GLUCAP in the last 168 hours. Lipid Profile: No results for input(s): CHOL, HDL, LDLCALC, TRIG, CHOLHDL, LDLDIRECT in the last 72 hours. Thyroid Function Tests: No results for input(s): TSH, T4TOTAL, FREET4, T3FREE, THYROIDAB in the last 72 hours. Anemia Panel: No results for input(s): VITAMINB12, FOLATE, FERRITIN, TIBC, IRON, RETICCTPCT in the last 72 hours. Sepsis Labs: Recent Labs  Lab 05/22/20 1934 05/22/20 2312  LATICACIDVEN 1.0 0.6    Recent Results (from the past 240 hour(s))  Respiratory Panel by RT PCR (Flu A&B, Covid) - Nasopharyngeal Swab     Status: None   Collection Time: 05/22/20  7:34 PM   Specimen: Nasopharyngeal Swab  Result Value Ref Range Status   SARS Coronavirus 2 by RT PCR NEGATIVE NEGATIVE  Final    Comment: (NOTE) SARS-CoV-2 target nucleic acids are NOT DETECTED.  The SARS-CoV-2 RNA is generally detectable in upper respiratoy specimens during the acute phase of infection. The lowest concentration of SARS-CoV-2 viral copies this assay can detect is 131 copies/mL. A negative result does not preclude SARS-Cov-2 infection and should not be used as the sole basis for treatment or other patient management decisions. A negative result may occur with  improper specimen collection/handling, submission of specimen other than nasopharyngeal swab, presence of viral mutation(s) within the areas targeted by this assay, and inadequate number of viral copies (<131 copies/mL). A negative result must be combined with clinical observations, patient history, and epidemiological information. The expected result is Negative.  Fact Sheet for Patients:  PinkCheek.be  Fact Sheet for Healthcare Providers:  GravelBags.it  This test is no t yet approved or cleared by the [place] FDA and  has been authorized for detection and/or diagnosis of SARS-CoV-2 by FDA under an Emergency Use Authorization (EUA). This EUA will  remain  in effect (meaning this test can be used) for the duration of the COVID-19 declaration under Section 564(b)(1) of the Act, 21 U.S.C. section 360bbb-3(b)(1), unless the authorization is terminated or revoked sooner.     Influenza A by PCR NEGATIVE NEGATIVE Final   Influenza B by PCR NEGATIVE NEGATIVE Final    Comment: (NOTE) The Xpert Xpress SARS-CoV-2/FLU/RSV assay is intended as an aid in  the diagnosis of influenza from Nasopharyngeal swab specimens and  should not be used as a sole basis for treatment. Nasal washings and  aspirates are unacceptable for Xpert Xpress SARS-CoV-2/FLU/RSV  testing.  Fact Sheet for Patients: PinkCheek.be  Fact Sheet for Healthcare  Providers: GravelBags.it  This test is not yet approved or cleared by the [place] FDA and  has been authorized for detection and/or diagnosis of SARS-CoV-2 by  FDA under an Emergency Use Authorization (EUA). This EUA will remain  in effect (meaning this test can be used) for the duration of the  Covid-19 declaration under Section 564(b)(1) of the Act, 21  U.S.C. section 360bbb-3(b)(1), unless the authorization is  terminated or revoked. Performed at [facility], [address], [place], [place] [address]      Radiology Studies: CT ABDOMEN PELVIS WO CONTRAST  Result Date: 05/22/2020 CLINICAL DATA:  Intermittent rectal bleeding. EXAM: CT ABDOMEN AND PELVIS WITHOUT CONTRAST TECHNIQUE: Multidetector CT imaging of the abdomen and pelvis was performed following the standard protocol without IV contrast. COMPARISON:  None. FINDINGS: Lower chest: No acute abnormality. Hepatobiliary: A 2.9 cm x 3.1 cm ill-defined area of heterogeneous low attenuation is seen within the lateral aspect of the liver dome (axial CT image 17, CT series number 2). An ill-defined, slightly exophytic 6.9 cm x 5.0 cm area of heterogeneous mildly decreased attenuation is seen within the posterior aspect of the right lobe of the liver. An ill-defined central area of low attenuation is noted within this region. No gallstones, gallbladder wall thickening, or biliary dilatation. Pancreas: Unremarkable. No pancreatic ductal dilatation or surrounding inflammatory changes. Spleen: Normal in size without focal abnormality. Adrenals/Urinary Tract: Adrenal glands are unremarkable. Kidneys are normal in size, without focal lesions. Marked severity bilateral hydronephrosis and hydroureter is seen. The urinary bladder is partially contracted and subsequently limited in evaluation. Stomach/Bowel: Stomach is within normal limits. Appendix appears normal. No evidence of bowel dilatation. There is marked  severity thickening of the mid and distal sigmoid colon associated pericolonic inflammatory fat stranding is noted. Vascular/Lymphatic: No significant vascular findings are present. There is moderate severity para-aortic and aortocaval lymphadenopathy. The largest lymph node measures approximately 1.7 cm x 1.7 cm along the para-aortic region at the level of the kidney. It should be noted that this area is limited in evaluation in the absence of intravenous contrast. A 2.8 cm x 2.4 cm heterogeneous soft tissue mass is seen along the posterior aspect of the distal left iliac vessels (axial CT images 68 through 74, CT series number 2). Numerous subcentimeter soft tissue nodules are seen within the mesentery of the pelvis, along the midline (axial CT images 58 through 66, CT series number 2). Numerous additional small soft tissue nodules are seen surrounding the distal sigmoid colon and rectosigmoid junction. Reproductive: The uterus is mildly enlarged. The uterine fundus is positioned to the left of midline within the anterior aspect of the pelvis. Other: No abdominal wall hernia or abnormality. A small amount of abdominal and pelvic fluid is noted. Musculoskeletal: No acute or significant osseous findings. IMPRESSION: 1.  Marked severity sigmoid colitis. Due to the numerous areas of soft tissue nodularity within the mesentery in this region, an underlying neoplastic process cannot be excluded. Correlation with colonoscopy is recommended. 2. Heterogeneous liver masses, as described above, suspicious for an underlying neoplasm. MRI correlation is recommended. 3. Moderate severity para-aortic and aortocaval lymphadenopathy with an additional enlarged, irregular appearing lymph node seen along the posterior aspect of the distal left iliac vessels. This is also worrisome for sequelae associated with an underlying neoplasm. 4. Marked severity bilateral hydronephrosis and hydroureter which likely represents partial renal  obstruction secondary to the inflammatory and suspected neoplastic process seen within the pelvis. Electronically Signed   By: Virgina Norfolk M.D.   On: 05/22/2020 20:26   [place] Intraoperative  Result Date: 05/23/2020 CLINICAL DATA:  Ultrasound was provided for use by the ordering physician.  No provider Interpretation or professional fees incurred.    US RENAL  Result Date: 05/23/2020 CLINICAL DATA:  Acute kidney injury EXAM: RENAL / URINARY TRACT ULTRASOUND COMPLETE COMPARISON:  CT 05/22/2020 FINDINGS: Right Kidney: Renal measurements: 11.5 x 5.2 x 5.5 cm = volume: 170 mL. Mild renal cortical thinning with increased cortical echogenicity. Severe right hydronephrosis. The visualized right ureter is moderately dilated. No discrete shadowing stone or mass lesion identified. Left Kidney: Renal measurements: 12.5 x 5.2 x 4.7 cm = volume: 162 mL. Cortical thickness within normal limits. Increased renal cortical echogenicity. Moderate to severe hydronephrosis. Visualized left ureter is mildly dilated. No discrete shadowing stone or mass lesion identified. Bladder: Partially distended.  No definite abnormality. Other: Inferior right lower lobe solid intrahepatic mass measuring up to 6.0 cm. Irregular and heterogeneous appearance of the uterus is partially visualized on images within the pelvis, incompletely characterized. IMPRESSION: 1. Severe right hydroureteronephrosis. 2. Moderate to severe left hydronephrosis. 3. Increased renal cortical echogenicity bilaterally suggesting medical renal disease. 4. Very irregular appearance of visualized portion of the uterus noted on transabdominal imaging through the pelvis. Recommend dedicated pelvic ultrasound for further evaluation. 5. Solid 6.0 cm inferior right hepatic lobe mass most suggestive of metastatic disease based on the previous CT. Electronically Signed   By: Davina Poke D.O.   On: 05/23/2020 10:04   IR NEPHROSTOMY PLACEMENT LEFT  Result Date:  05/23/2020 INDICATION: Patient admitted with new diagnosis of malignancy of unknown etiology now with bilateral obstructive hydronephrosis. Request made for placement of bilateral percutaneous nephrostomy catheters for urinary diversion purposes. EXAM: 1. ULTRASOUND GUIDANCE FOR PUNCTURE OF THE BILATERAL RENAL COLLECTING SYSTEMS 2. BILATERAL PERCUTANEOUS NEPHROSTOMY TUBE PLACEMENT. COMPARISON:  CT abdomen and pelvis-05/22/2020 MEDICATIONS: Ancef 2 gm IV; The antibiotic was administered in an appropriate time frame prior to skin puncture. ANESTHESIA/SEDATION: Moderate (conscious) sedation was employed during this procedure. A total of Versed 4 mg and Fentanyl 100 mcg was administered intravenously. Moderate Sedation Time: 22 minutes. The patient's level of consciousness and vital signs were monitored continuously by radiology nursing throughout the procedure under my direct supervision. CONTRAST:  A total of 20 cc Visipaque 320 was administered into both renal collecting systems. FLUOROSCOPY TIME:  1 minute, 42 seconds (32.4 mGy) COMPLICATIONS: None immediate. PROCEDURE: The procedure, risks, benefits, and alternatives were explained to the patient. Questions regarding the procedure were encouraged and answered. The patient understands and consents to the procedure. A timeout was performed prior to the initiation of the procedure. The bilateral flanks were prepped and draped in the usual sterile fashion and a sterile drape was applied covering the operative field. A sterile gown and sterile gloves  were used for the procedure. Local anesthesia was provided with 1% Lidocaine with epinephrine. Attention was first paid towards placement of the left-sided percutaneous nephrostomy catheter. Ultrasound was used to localize the left kidney. Under direct ultrasound guidance, a 20 gauge needle was advanced into the renal collecting system. An ultrasound image documentation was performed. Access within the collecting system  was confirmed with the efflux of urine followed by limited contrast injection. Over a Nitrex wire, the tract was dilated with an Accustick stent. Next, under intermittent fluoroscopic guidance and over a short Amplatz wire, the track was dilated ultimately allowing placement of a 10-French percutaneous nephrostomy catheter which was advanced to the level of the renal pelvis where the coil was formed and locked. Contrast was injected and several spot fluoroscopic images were obtained in various obliquities. The catheter was secured at the skin with a Prolene retention suture and stat lock device and connected to a gravity bag was placed. The identical procedure was repeated for the contralateral right kidney allowing placement of a 10 French percutaneous nephrostomy catheter with end ultimately coiled and locked within the right renal pelvis. Dressings were applied. The patient tolerated procedure well without immediate postprocedural complication. 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 Under a combination of ultrasound and fluoroscopic guidance, a posterior inferior calix was targeted bilaterally allowing placement of bilateral 10 French percutaneous nephrostomy catheters with ends coiled and locked within the bilateral renal pelvises. Contrast injection confirmed appropriate positioning. IMPRESSION: Successful ultrasound and fluoroscopic guided placement of bilateral 10 French PCNs. Electronically Signed   By: Sandi Mariscal M.D.   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 Request made for placement of bilateral percutaneous nephrostomy catheters for urinary diversion purposes. EXAM: 1. ULTRASOUND  GUIDANCE FOR PUNCTURE OF THE BILATERAL RENAL COLLECTING SYSTEMS 2. BILATERAL PERCUTANEOUS NEPHROSTOMY TUBE PLACEMENT. COMPARISON:  CT abdomen and pelvis-05/22/2020 MEDICATIONS: Ancef 2 gm IV; The antibiotic was administered in an appropriate time frame prior to skin puncture. ANESTHESIA/SEDATION: Moderate (conscious) sedation was employed during this procedure. A total of Versed 4 mg and Fentanyl 100 mcg was administered intravenously. Moderate Sedation Time: 22 minutes. The patient's level of consciousness and vital signs were monitored continuously by radiology nursing throughout the procedure under my direct supervision. CONTRAST:  A total of 20 cc Visipaque 320 was administered into both renal collecting systems. FLUOROSCOPY TIME:  1 minute, 42 seconds (98.9 mGy) COMPLICATIONS: None immediate. PROCEDURE: The procedure, risks, benefits, and alternatives were explained to the patient. Questions regarding the procedure were encouraged and answered. The patient understands and consents to the procedure. A timeout was performed prior to the initiation of the procedure. The bilateral flanks were prepped and draped in the usual sterile fashion and a sterile drape was applied covering the operative field. A sterile gown and sterile gloves were used for the procedure. Local anesthesia was provided with 1% Lidocaine with epinephrine. Attention was first paid towards placement of the left-sided percutaneous nephrostomy catheter. Ultrasound was used to localize the left kidney. Under direct ultrasound guidance, a 20 gauge needle was advanced into the renal collecting system. An ultrasound image documentation was performed. Access within the collecting system was confirmed with the efflux of urine followed by limited contrast injection. Over a Nitrex wire, the tract was dilated  with an Accustick stent. Next, under intermittent fluoroscopic guidance and over a short Amplatz wire, the track was dilated ultimately allowing  placement of a 10-French percutaneous nephrostomy catheter which was advanced to the level of the renal pelvis where the coil was formed and locked. Contrast was injected and several spot fluoroscopic images were obtained in various obliquities. The catheter was secured at the skin with a Prolene retention suture and stat lock device and connected to a gravity bag was placed. The identical procedure was repeated for the contralateral right kidney allowing placement of a 10 French percutaneous nephrostomy catheter with end ultimately coiled and locked within the right renal pelvis. Dressings were applied. The patient tolerated procedure well without immediate postprocedural complication. 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 Under a combination of ultrasound and fluoroscopic guidance, a posterior inferior calix was targeted bilaterally allowing placement of bilateral 10 French percutaneous nephrostomy catheters with ends coiled and locked within the bilateral renal pelvises. Contrast injection confirmed appropriate positioning. IMPRESSION: Successful ultrasound and fluoroscopic guided placement of bilateral 10 French PCNs. Electronically Signed   By: Sandi Mariscal M.D.   On: 05/23/2020 12:55    Scheduled Meds:  fentaNYL       midazolam       pantoprazole (PROTONIX) IV  40 mg Intravenous Q12H   Continuous Infusions:  ceFAZolin      ceFAZolin (ANCEF) IV     lactated ringers 100 mL/hr at 05/23/20 0905     LOS: 1 day   Time spent: 50 minutes. More than 50% of the time was spent with direct patient care and coordinating with other specialities.  Lorella Nimrod, MD [facility]  If 7PM-7AM, please contact night-coverage [email or web address]  05/23/2020, 3:11 PM   This record has been created using Systems analyst. Errors have been  sought and corrected,but may not always be located. Such creation errors do not reflect on the standard of care.

## 2020-05-24 DIAGNOSIS — K625 Hemorrhage of anus and rectum: Secondary | ICD-10-CM

## 2020-05-24 DIAGNOSIS — N139 Obstructive and reflux uropathy, unspecified: Secondary | ICD-10-CM

## 2020-05-24 LAB — GASTROINTESTINAL PANEL BY PCR, STOOL (REPLACES STOOL CULTURE)

## 2020-05-24 LAB — C DIFFICILE QUICK SCREEN W PCR REFLEX
C Diff antigen: NEGATIVE
C Diff interpretation: NOT DETECTED
C Diff toxin: NEGATIVE

## 2020-05-24 LAB — CANCER ANTIGEN 27.29: CA 27.29: 10.1 U/mL (ref 0.0–38.6)

## 2020-05-24 LAB — RENAL FUNCTION PANEL
Albumin: 2.5 g/dL — ABNORMAL LOW (ref 3.5–5.0)
Anion gap: 11 (ref 5–15)
BUN: 84 mg/dL — ABNORMAL HIGH (ref 6–20)
CO2: 18 mmol/L — ABNORMAL LOW (ref 22–32)
Calcium: 7.8 mg/dL — ABNORMAL LOW (ref 8.9–10.3)
Chloride: 108 mmol/L (ref 98–111)
Creatinine, Ser: 9.89 mg/dL — ABNORMAL HIGH (ref 0.44–1.00)
GFR, Estimated: 4 mL/min — ABNORMAL LOW (ref >60)
Glucose, Bld: 115 mg/dL — ABNORMAL HIGH (ref 70–99)
Phosphorus: 6.8 mg/dL — ABNORMAL HIGH (ref 2.5–4.6)
Potassium: 4.1 mmol/L (ref 3.5–5.1)
Sodium: 137 mmol/L (ref 135–145)

## 2020-05-24 LAB — CBC
HCT: 19.4 % — ABNORMAL LOW (ref 36.0–46.0)
Hemoglobin: 6.4 g/dL — ABNORMAL LOW (ref 12.0–15.0)
MCH: 25.2 pg — ABNORMAL LOW (ref 26.0–34.0)
MCHC: 33 g/dL (ref 30.0–36.0)
MCV: 76.4 fL — ABNORMAL LOW (ref 80.0–100.0)
Platelets: 392 10*3/uL (ref 150–400)
RBC: 2.54 MIL/uL — ABNORMAL LOW (ref 3.87–5.11)
RDW: 16.8 % — ABNORMAL HIGH (ref 11.5–15.5)
WBC: 9.3 10*3/uL (ref 4.0–10.5)
nRBC: 0 % (ref 0.0–0.2)

## 2020-05-24 LAB — CANCER ANTIGEN 19-9: CA 19-9: 11 U/mL (ref 0–35)

## 2020-05-24 LAB — PREPARE RBC (CROSSMATCH)

## 2020-05-24 LAB — CEA: CEA: 9.9 ng/mL — ABNORMAL HIGH (ref 0.0–4.7)

## 2020-05-24 LAB — CA 125: Cancer Antigen (CA) 125: 25.1 U/mL (ref 0.0–38.1)

## 2020-05-24 MED ORDER — CALCIUM ACETATE (PHOS BINDER) 667 MG PO CAPS
667.0000 mg | ORAL_CAPSULE | Freq: Three times a day (TID) | ORAL | Status: DC
  Administered 2020-05-25 – 2020-06-01 (×14): 667 mg via ORAL
  Filled 2020-05-24 (×17): qty 1

## 2020-05-24 MED ORDER — ACETAMINOPHEN 325 MG PO TABS
650.0000 mg | ORAL_TABLET | Freq: Once | ORAL | Status: AC
  Administered 2020-05-24: 650 mg via ORAL
  Filled 2020-05-24: qty 2

## 2020-05-24 MED ORDER — SODIUM CHLORIDE 0.9 % IV SOLN
300.0000 mg | Freq: Once | INTRAVENOUS | Status: AC
  Administered 2020-05-24: 300 mg via INTRAVENOUS
  Filled 2020-05-24: qty 15

## 2020-05-24 MED ORDER — SODIUM BICARBONATE 650 MG PO TABS
650.0000 mg | ORAL_TABLET | Freq: Three times a day (TID) | ORAL | Status: DC
  Administered 2020-05-24 – 2020-06-01 (×18): 650 mg via ORAL
  Filled 2020-05-24 (×22): qty 1

## 2020-05-24 MED ORDER — DIPHENHYDRAMINE HCL 50 MG/ML IJ SOLN
25.0000 mg | Freq: Once | INTRAMUSCULAR | Status: AC
  Administered 2020-05-24: 25 mg via INTRAVENOUS
  Filled 2020-05-24: qty 1

## 2020-05-24 MED ORDER — SODIUM CHLORIDE 0.9 % IV SOLN: Freq: Once | INTRAVENOUS | Status: AC

## 2020-05-24 NOTE — ED Notes (Signed)
Patient up to bathroom independently

## 2020-05-24 NOTE — ED Notes (Signed)
Daughter at bedside.

## 2020-05-24 NOTE — ED Notes (Signed)
Pt ambulated to bathroom. States small amt urine, no BM, "only gas".

## 2020-05-24 NOTE — Progress Notes (Signed)
Cephas Darby, MD [address]  [place]  [place], [place] [address]  Main: [phone]  Fax: [phone] Pager: [phone]   Subjective: Patient had nephrostomy tubes placed by IR yesterday.  She reports abdominal pain is significantly improved.  She continues to notice rectal bleeding.  Her hemoglobin did not respond appropriately to blood transfusion.  She is also on maintenance IV fluids   Objective: Vital signs in last 24 hours: Vitals:   05/24/20 1230 05/24/20 1245 05/24/20 1350 05/24/20 1419  BP: 111/71 115/71 125/72 115/76  Pulse: 86 88 88 84  Resp: 20  18 16   Temp:   98 F (36.7 C) 98.3 F (36.8 C)  TempSrc:   Oral Oral  SpO2: 100% 100% 98% 100%  Weight:      Height:       Weight change:   Intake/Output Summary (Last 24 hours) at 05/24/2020 1429 Last data filed at 05/24/2020 1353 Gross per 24 hour  Intake 970 ml  Output 2800 ml  Net -1830 ml     Exam: Heart:: Regular rate and rhythm, S1S2 present or without murmur or extra heart sounds Lungs: normal and clear to auscultation Abdomen: soft, nontender, normal bowel sounds   Lab Results: CBC Latest Ref Rng & Units 05/24/2020 05/23/2020 05/22/2020  WBC 4.0 - 10.5 K/uL 9.3 9.8 11.4(H)  Hemoglobin 12.0 - 15.0 g/dL 6.4(L) 6.9(L) 6.4(L)  Hematocrit 36 - 46 % 19.4(L) 21.4(L) 20.0(L)  Platelets 150 - 400 K/uL 392 425(H) 456(H)   CMP Latest Ref Rng & Units 05/24/2020 05/23/2020 05/22/2020  Glucose 70 - 99 mg/dL 115(H) 100(H) 108(H)  BUN 6 - 20 mg/dL 84(H) 87(H) 83(H)  Creatinine 0.44 - 1.00 mg/dL 9.89(H) 10.74(H) 9.82(H)  Sodium 135 - 145 mmol/L 137 138 135  Potassium 3.5 - 5.1 mmol/L 4.1 4.0 4.1  Chloride 98 - 111 mmol/L 108 107 103  CO2 22 - 32 mmol/L 18(L) 16(L) 16(L)  Calcium 8.9 - 10.3 mg/dL 7.8(L) 8.3(L) 8.1(L)  Total Protein 6.5 - 8.1 g/dL - - 7.7  Total Bilirubin 0.3 - 1.2 mg/dL - - 0.5  Alkaline Phos 38 - 126 U/L - - 62  AST 15 - 41 U/L - - 14(L)  ALT 0 - 44 U/L - - 9    Micro  Results: Recent Results (from the past 240 hour(s))  Respiratory Panel by RT PCR (Flu A&B, Covid) - Nasopharyngeal Swab     Status: None   Collection Time: 05/22/20  7:34 PM   Specimen: Nasopharyngeal Swab  Result Value Ref Range Status   SARS Coronavirus 2 by RT PCR NEGATIVE NEGATIVE Final    Comment: (NOTE) SARS-CoV-2 target nucleic acids are NOT DETECTED.  The SARS-CoV-2 RNA is generally detectable in upper respiratoy specimens during the acute phase of infection. The lowest concentration of SARS-CoV-2 viral copies this assay can detect is 131 copies/mL. A negative result does not preclude SARS-Cov-2 infection and should not be used as the sole basis for treatment or other patient management decisions. A negative result may occur with  improper specimen collection/handling, submission of specimen other than nasopharyngeal swab, presence of viral mutation(s) within the areas targeted by this assay, and inadequate number of viral copies (<131 copies/mL). A negative result must be combined with clinical observations, patient history, and epidemiological information. The expected result is Negative.  Fact Sheet for Patients:  PinkCheek.be  Fact Sheet for Healthcare Providers:  GravelBags.it  This test is no t yet approved or cleared by the  [place] [employer]ural [employer] and  has been authorized for detection and/or diagnosis of SARS-CoV-2 by FDA under an Print production planner (EUA). This EUA will remain  in effect (meaning this test can be used) for the duration of the COVID-19 declaration under Section 564(b)(1) of the Act, 21 U.S.C. section 360bbb-3(b)(1), unless the authorization is terminated or revoked sooner.     Influenza A by PCR NEGATIVE NEGATIVE Final   Influenza B by PCR NEGATIVE NEGATIVE Final    Comment: (NOTE) The Xpert Xpress SARS-CoV-2/FLU/RSV assay is intended as an aid in  the diagnosis of influenza from  Nasopharyngeal swab specimens and  should not be used as a sole basis for treatment. Nasal washings and  aspirates are unacceptable for Xpert Xpress SARS-CoV-2/FLU/RSV  testing.  Fact Sheet for Patients: PinkCheek.be  Fact Sheet for Healthcare Providers: GravelBags.it  This test is not yet approved or cleared by the [place] FDA and  has been authorized for detection and/or diagnosis of SARS-CoV-2 by  FDA under an Emergency Use Authorization (EUA). This EUA will remain  in effect (meaning this test can be used) for the duration of the  Covid-19 declaration under Section 564(b)(1) of the Act, 21  U.S.C. section 360bbb-3(b)(1), unless the authorization is  terminated or revoked. Performed at [facility] [address] [place], [place] [address]   Gastrointestinal Panel by PCR , Stool     Status: None   Collection Time: 05/24/20  1:00 AM   Specimen: Stool  Result Value Ref Range Status   Campylobacter species NOT DETECTED NOT DETECTED Final   Plesimonas shigelloides NOT DETECTED NOT DETECTED Final   Salmonella species NOT DETECTED NOT DETECTED Final   Yersinia enterocolitica NOT DETECTED NOT DETECTED Final   Vibrio species NOT DETECTED NOT DETECTED Final   Vibrio cholerae NOT DETECTED NOT DETECTED Final   Enteroaggregative E coli (EAEC) NOT DETECTED NOT DETECTED Final   Enteropathogenic E coli (EPEC) NOT DETECTED NOT DETECTED Final   Enterotoxigenic E coli (ETEC) NOT DETECTED NOT DETECTED Final   Shiga like toxin producing E coli (STEC) NOT DETECTED NOT DETECTED Final   Shigella/Enteroinvasive E coli (EIEC) NOT DETECTED NOT DETECTED Final   Cryptosporidium NOT DETECTED NOT DETECTED Final   Cyclospora cayetanensis NOT DETECTED NOT DETECTED Final   Entamoeba histolytica NOT DETECTED NOT DETECTED Final   Giardia lamblia NOT DETECTED NOT DETECTED Final   Adenovirus F40/41 NOT DETECTED NOT DETECTED Final    Astrovirus NOT DETECTED NOT DETECTED Final   Norovirus GI/GII NOT DETECTED NOT DETECTED Final   Rotavirus A NOT DETECTED NOT DETECTED Final   Sapovirus (I, II, IV, and V) NOT DETECTED NOT DETECTED Final    Comment: Performed at [facility], [address] [place], [place] [address]  C Difficile Quick Screen w PCR reflex     Status: None   Collection Time: 05/24/20  1:00 AM   Specimen: Stool  Result Value Ref Range Status   C Diff antigen NEGATIVE NEGATIVE Final   C Diff toxin NEGATIVE NEGATIVE Final   C Diff interpretation No C. difficile detected.  Final    Comment: Performed at [facility], [address] [place], [place] [address]   Studies/Results: CT ABDOMEN PELVIS WO CONTRAST  Result Date: 05/22/2020 CLINICAL DATA:  Intermittent rectal bleeding. EXAM: CT ABDOMEN AND PELVIS WITHOUT CONTRAST TECHNIQUE: Multidetector CT imaging of the abdomen and pelvis was performed following the standard protocol without IV contrast. COMPARISON:  None. FINDINGS: Lower chest: No acute  abnormality. Hepatobiliary: A 2.9 cm x 3.1 cm ill-defined area of heterogeneous low attenuation is seen within the lateral aspect of the liver dome (axial CT image 17, CT series number 2). An ill-defined, slightly exophytic 6.9 cm x 5.0 cm area of heterogeneous mildly decreased attenuation is seen within the posterior aspect of the right lobe of the liver. An ill-defined central area of low attenuation is noted within this region. No gallstones, gallbladder wall thickening, or biliary dilatation. Pancreas: Unremarkable. No pancreatic ductal dilatation or surrounding inflammatory changes. Spleen: Normal in size without focal abnormality. Adrenals/Urinary Tract: Adrenal glands are unremarkable. Kidneys are normal in size, without focal lesions. Marked severity bilateral hydronephrosis and hydroureter is seen. The urinary bladder is partially contracted and subsequently limited in evaluation. Stomach/Bowel:  Stomach is within normal limits. Appendix appears normal. No evidence of bowel dilatation. There is marked severity thickening of the mid and distal sigmoid colon associated pericolonic inflammatory fat stranding is noted. Vascular/Lymphatic: No significant vascular findings are present. There is moderate severity para-aortic and aortocaval lymphadenopathy. The largest lymph node measures approximately 1.7 cm x 1.7 cm along the para-aortic region at the level of the kidney. It should be noted that this area is limited in evaluation in the absence of intravenous contrast. A 2.8 cm x 2.4 cm heterogeneous soft tissue mass is seen along the posterior aspect of the distal left iliac vessels (axial CT images 68 through 74, CT series number 2). Numerous subcentimeter soft tissue nodules are seen within the mesentery of the pelvis, along the midline (axial CT images 58 through 66, CT series number 2). Numerous additional small soft tissue nodules are seen surrounding the distal sigmoid colon and rectosigmoid junction. Reproductive: The uterus is mildly enlarged. The uterine fundus is positioned to the left of midline within the anterior aspect of the pelvis. Other: No abdominal wall hernia or abnormality. A small amount of abdominal and pelvic fluid is noted. Musculoskeletal: No acute or significant osseous findings. IMPRESSION: 1. Marked severity sigmoid colitis. Due to the numerous areas of soft tissue nodularity within the mesentery in this region, an underlying neoplastic process cannot be excluded. Correlation with colonoscopy is recommended. 2. Heterogeneous liver masses, as described above, suspicious for an underlying neoplasm. MRI correlation is recommended. 3. Moderate severity para-aortic and aortocaval lymphadenopathy with an additional enlarged, irregular appearing lymph node seen along the posterior aspect of the distal left iliac vessels. This is also worrisome for sequelae associated with an underlying  neoplasm. 4. Marked severity bilateral hydronephrosis and hydroureter which likely represents partial renal obstruction secondary to the inflammatory and suspected neoplastic process seen within the pelvis. Electronically Signed   By: Virgina Norfolk M.D.   On: 05/22/2020 20:26   [place] Intraoperative  Result Date: 05/23/2020 INDICATION: Patient admitted with new diagnosis of malignancy of unknown etiology now with bilateral obstructive hydronephrosis. Request made for placement of bilateral percutaneous nephrostomy catheters for urinary diversion purposes. EXAM: 1. ULTRASOUND GUIDANCE FOR PUNCTURE OF THE BILATERAL RENAL COLLECTING SYSTEMS 2. BILATERAL PERCUTANEOUS NEPHROSTOMY TUBE PLACEMENT. COMPARISON:  CT abdomen and pelvis-05/22/2020 MEDICATIONS: Ancef 2 gm IV; The antibiotic was administered in an appropriate time frame prior to skin puncture. ANESTHESIA/SEDATION: Moderate (conscious) sedation was employed during this procedure. A total of Versed 4 mg and Fentanyl 100 mcg was administered intravenously. Moderate Sedation Time: 22 minutes. The patient's level of consciousness and vital signs were monitored continuously by radiology nursing throughout the procedure under my direct supervision. CONTRAST:  A total of 20 cc Visipaque 320  was administered into both renal collecting systems. FLUOROSCOPY TIME:  1 minute, 42 seconds (17.5 mGy) COMPLICATIONS: None immediate. PROCEDURE: The procedure, risks, benefits, and alternatives were explained to the patient. Questions regarding the procedure were encouraged and answered. The patient understands and consents to the procedure. A timeout was performed prior to the initiation of the procedure. The bilateral flanks were prepped and draped in the usual sterile fashion and a sterile drape was applied covering the operative field. A sterile gown and sterile gloves were used for the procedure. Local anesthesia was provided with 1% Lidocaine with epinephrine. Attention  was first paid towards placement of the left-sided percutaneous nephrostomy catheter. Ultrasound was used to localize the left kidney. Under direct ultrasound guidance, a 20 gauge needle was advanced into the renal collecting system. An ultrasound image documentation was performed. Access within the collecting system was confirmed with the efflux of urine followed by limited contrast injection. Over a Nitrex wire, the tract was dilated with an Accustick stent. Next, under intermittent fluoroscopic guidance and over a short Amplatz wire, the track was dilated ultimately allowing placement of a 10-French percutaneous nephrostomy catheter which was advanced to the level of the renal pelvis where the coil was formed and locked. Contrast was injected and several spot fluoroscopic images were obtained in various obliquities. The catheter was secured at the skin with a Prolene retention suture and stat lock device and connected to a gravity bag was placed. The identical procedure was repeated for the contralateral right kidney allowing placement of a 10 French percutaneous nephrostomy catheter with end ultimately coiled and locked within the right renal pelvis. Dressings were applied. The patient tolerated procedure well without immediate postprocedural complication. 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 Under a combination of ultrasound and fluoroscopic guidance, a posterior inferior calix was targeted bilaterally allowing placement of bilateral 10 French percutaneous nephrostomy catheters with ends coiled and locked within the bilateral renal pelvises. Contrast injection confirmed appropriate positioning. IMPRESSION: Successful ultrasound and fluoroscopic guided placement of bilateral 10 French PCNs. Electronically Signed   By: Sandi Mariscal M.D.   On: 05/23/2020 12:55   US  RENAL  Result Date: 05/23/2020 CLINICAL DATA:  Acute kidney injury EXAM: RENAL / URINARY TRACT ULTRASOUND COMPLETE COMPARISON:  CT 05/22/2020 FINDINGS: Right Kidney: Renal measurements: 11.5 x 5.2 x 5.5 cm = volume: 170 mL. Mild renal cortical thinning with increased cortical echogenicity. Severe right hydronephrosis. The visualized right ureter is moderately dilated. No discrete shadowing stone or mass lesion identified. Left Kidney: Renal measurements: 12.5 x 5.2 x 4.7 cm = volume: 162 mL. Cortical thickness within normal limits. Increased renal cortical echogenicity. Moderate to severe hydronephrosis. Visualized left ureter is mildly dilated. No discrete shadowing stone or mass lesion identified. Bladder: Partially distended.  No definite abnormality. Other: Inferior right lower lobe solid intrahepatic mass measuring up to 6.0 cm. Irregular and heterogeneous appearance of the uterus is partially visualized on images within the pelvis, incompletely characterized. IMPRESSION: 1. Severe right hydroureteronephrosis. 2. Moderate to severe left hydronephrosis. 3. Increased renal cortical echogenicity bilaterally suggesting medical renal disease. 4. Very irregular appearance of visualized portion of the uterus noted on transabdominal imaging through the pelvis. Recommend dedicated pelvic ultrasound for further evaluation. 5. Solid 6.0 cm inferior right hepatic lobe mass most suggestive of metastatic disease based on the previous CT. Electronically Signed   By: Davina Poke D.O.  On: 05/23/2020 10:04   IR NEPHROSTOMY PLACEMENT LEFT  Result Date: 05/23/2020 INDICATION: Patient admitted with new diagnosis of malignancy of unknown etiology now with bilateral obstructive hydronephrosis. Request made for placement of bilateral percutaneous nephrostomy catheters for urinary diversion purposes. EXAM: 1. ULTRASOUND GUIDANCE FOR PUNCTURE OF THE BILATERAL RENAL COLLECTING SYSTEMS 2. BILATERAL PERCUTANEOUS NEPHROSTOMY  TUBE PLACEMENT. COMPARISON:  CT abdomen and pelvis-05/22/2020 MEDICATIONS: Ancef 2 gm IV; The antibiotic was administered in an appropriate time frame prior to skin puncture. ANESTHESIA/SEDATION: Moderate (conscious) sedation was employed during this procedure. A total of Versed 4 mg and Fentanyl 100 mcg was administered intravenously. Moderate Sedation Time: 22 minutes. The patient's level of consciousness and vital signs were monitored continuously by radiology nursing throughout the procedure under my direct supervision. CONTRAST:  A total of 20 cc Visipaque 320 was administered into both renal collecting systems. FLUOROSCOPY TIME:  1 minute, 42 seconds (81.1 mGy) COMPLICATIONS: None immediate. PROCEDURE: The procedure, risks, benefits, and alternatives were explained to the patient. Questions regarding the procedure were encouraged and answered. The patient understands and consents to the procedure. A timeout was performed prior to the initiation of the procedure. The bilateral flanks were prepped and draped in the usual sterile fashion and a sterile drape was applied covering the operative field. A sterile gown and sterile gloves were used for the procedure. Local anesthesia was provided with 1% Lidocaine with epinephrine. Attention was first paid towards placement of the left-sided percutaneous nephrostomy catheter. Ultrasound was used to localize the left kidney. Under direct ultrasound guidance, a 20 gauge needle was advanced into the renal collecting system. An ultrasound image documentation was performed. Access within the collecting system was confirmed with the efflux of urine followed by limited contrast injection. Over a Nitrex wire, the tract was dilated with an Accustick stent. Next, under intermittent fluoroscopic guidance and over a short Amplatz wire, the track was dilated ultimately allowing placement of a 10-French percutaneous nephrostomy catheter which was advanced to the level of the renal  pelvis where the coil was formed and locked. Contrast was injected and several spot fluoroscopic images were obtained in various obliquities. The catheter was secured at the skin with a Prolene retention suture and stat lock device and connected to a gravity bag was placed. The identical procedure was repeated for the contralateral right kidney allowing placement of a 10 French percutaneous nephrostomy catheter with end ultimately coiled and locked within the right renal pelvis. Dressings were applied. The patient tolerated procedure well without immediate postprocedural complication. 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 Under a combination of ultrasound and fluoroscopic guidance, a posterior inferior calix was targeted bilaterally allowing placement of bilateral 10 French percutaneous nephrostomy catheters with ends coiled and locked within the bilateral renal pelvises. Contrast injection confirmed appropriate positioning. IMPRESSION: Successful ultrasound and fluoroscopic guided placement of bilateral 10 French PCNs. Electronically Signed   By: Sandi Mariscal M.D.   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 Request made for placement of bilateral percutaneous nephrostomy catheters for urinary diversion purposes. EXAM: 1. ULTRASOUND GUIDANCE FOR PUNCTURE OF THE BILATERAL RENAL COLLECTING SYSTEMS 2. BILATERAL PERCUTANEOUS NEPHROSTOMY TUBE PLACEMENT. COMPARISON:  CT abdomen and pelvis-05/22/2020 MEDICATIONS: Ancef 2 gm IV; The antibiotic was administered in an appropriate time frame  prior to skin puncture. ANESTHESIA/SEDATION: Moderate (conscious) sedation was employed during this procedure. A total of  Versed 4 mg and Fentanyl 100 mcg was administered intravenously. Moderate Sedation Time: 22 minutes. The patient's level of consciousness and vital signs were monitored continuously by radiology nursing throughout the procedure under my direct supervision. CONTRAST:  A total of 20 cc Visipaque 320 was administered into both renal collecting systems. FLUOROSCOPY TIME:  1 minute, 42 seconds (47.8 mGy) COMPLICATIONS: None immediate. PROCEDURE: The procedure, risks, benefits, and alternatives were explained to the patient. Questions regarding the procedure were encouraged and answered. The patient understands and consents to the procedure. A timeout was performed prior to the initiation of the procedure. The bilateral flanks were prepped and draped in the usual sterile fashion and a sterile drape was applied covering the operative field. A sterile gown and sterile gloves were used for the procedure. Local anesthesia was provided with 1% Lidocaine with epinephrine. Attention was first paid towards placement of the left-sided percutaneous nephrostomy catheter. Ultrasound was used to localize the left kidney. Under direct ultrasound guidance, a 20 gauge needle was advanced into the renal collecting system. An ultrasound image documentation was performed. Access within the collecting system was confirmed with the efflux of urine followed by limited contrast injection. Over a Nitrex wire, the tract was dilated with an Accustick stent. Next, under intermittent fluoroscopic guidance and over a short Amplatz wire, the track was dilated ultimately allowing placement of a 10-French percutaneous nephrostomy catheter which was advanced to the level of the renal pelvis where the coil was formed and locked. Contrast was injected and several spot fluoroscopic images were obtained in various obliquities. The catheter was secured at the skin with a Prolene retention suture and stat lock device and connected to a gravity bag was placed.  The identical procedure was repeated for the contralateral right kidney allowing placement of a 10 French percutaneous nephrostomy catheter with end ultimately coiled and locked within the right renal pelvis. Dressings were applied. The patient tolerated procedure well without immediate postprocedural complication. 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 Under a combination of ultrasound and fluoroscopic guidance, a posterior inferior calix was targeted bilaterally allowing placement of bilateral 10 French percutaneous nephrostomy catheters with ends coiled and locked within the bilateral renal pelvises. Contrast injection confirmed appropriate positioning. IMPRESSION: Successful ultrasound and fluoroscopic guided placement of bilateral 10 French PCNs. Electronically Signed   By: Sandi Mariscal M.D.   On: 05/23/2020 12:55   Medications:  I have reviewed the patient's current medications. Prior to Admission: (Not in a hospital admission)  Scheduled:  pantoprazole (PROTONIX) IV  40 mg Intravenous Q12H   Continuous:  iron sucrose     lactated ringers Stopped (05/24/20 1353)   [id number] **OR** ondansetron (ZOFRAN) IV Anti-infectives (From admission, onward)   Start     Dose/Rate Route Frequency Ordered Stop   05/23/20 1100  ceFAZolin (ANCEF) IVPB 2g/100 mL premix        2 g 200 mL/hr over 30 Minutes Intravenous To Radiology 05/23/20 1046 05/24/20 1100   05/23/20 1049  ceFAZolin (ANCEF) 2-4 GM/100ML-% IVPB       Note to Pharmacy: Genelle Bal   : cabinet override      05/23/20 1049 05/23/20 2259     Scheduled Meds:  pantoprazole (PROTONIX) IV  40 mg Intravenous Q12H   Continuous Infusions:  iron sucrose  lactated ringers Stopped (05/24/20 1353)   PRN Meds:.ondansetron **OR** ondansetron (ZOFRAN) IV   Assessment: Principal  Problem:   GI bleed Active Problems:   Acute renal failure (HCC)   Obstructive uropathy   Colitis, acute   Bleeding per rectum  54 year old female with no significant past medical history is admitted with abdominal pain, acute renal failure secondary to obstructive hydronephrosis, rectal bleeding, liver mets s/p nephrostomy tubes placement  Plan: AKI: Secondary to obstructive hydronephrosis S/p bilateral nephrostomy tubes placement Creatinine is quite high but in the right direction Continue IV maintenance fluids, electrolytes are stable Nephrology is on board  Rectal bleeding, colitis Transfuse PRBCs as needed to maintain hemoglobin greater than 7 Patient is currently hemodynamically stable Would like to see her bit more stable/improving in terms of her renal function as well as hemoglobin before proceeding with colonoscopy.  Tentative plan for early next week  GI will follow along with you   LOS: 2 days   Desire Fulp 05/24/2020, 2:29 PM

## 2020-05-24 NOTE — Consult Note (Signed)
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 PCN tube placement recommended, and I spoke w/ Dr Pascal Lux who placed them yesterday. At this point no further GU intervention necessary. Please call GU service if further assistance necessary. PCN tubes should be changed q 4-6 wks per IR.

## 2020-05-24 NOTE — ED Notes (Addendum)
Pt ambulatory to toilet. C/o of feeling nauseous.

## 2020-05-24 NOTE — ED Notes (Signed)
Pt offered hospital bed but stated ER stretcher was comfortable and denied wanting to change beds at this time.

## 2020-05-24 NOTE — ED Notes (Signed)
453mL L urostomy output, clear yellow. 129mL R urostomy output, red tinge

## 2020-05-24 NOTE — ED Notes (Signed)
Provided 2 cups water to pt per pt request. Pt states nephrologist wants her to drink water to "flush out kidneys".

## 2020-05-24 NOTE — ED Notes (Signed)
IV site changed. Gown changed. Daughter still at bedside.

## 2020-05-24 NOTE — ED Notes (Signed)
Breakfast tray provided to pt.

## 2020-05-24 NOTE — ED Notes (Signed)
Urine is from L nephrostomy tube.

## 2020-05-24 NOTE — ED Notes (Signed)
Urine output 552mL yellow and clear L urostomy, 76mL yellow w red tinge R urostomy

## 2020-05-24 NOTE — Progress Notes (Signed)
PROGRESS NOTE    Anita Sullivan  WVP:[id number] DOB: 1965/11/21 DOA: 05/22/2020 PCP: Volney American, PA-C   Brief Narrative:  Anita Sullivan is a 54 y.o. female admitted last night with rectal bleeding.  Ongoing for 3 months on and off.The emergency room her hemoglobin was noted to be 6.4 g with an MCV of 75.8. Creatinine of 9.82 and CO2 of 16. Large amount of blood and leukocytes in the urine. CT scan of the abdomen and pelvis that demonstrated marked severe sigmoid colitis numerous, numerous areas of soft tissue nodularity within the mesentery suggestive of an underlying neoplastic process that cannot be excluded heterogeneous liver masses noted suspicious for a neoplasm.  Moderately severe Faera aortic and aortocaval lymphadenopathy noted marked severe bilateral hydronephrosis and hydroureter representing partial renal obstruction. High concern for extensive malignancy. Patient has quite a bit of unintentional weight loss recently. No family history of malignancy. Multiple specialities were consulted this morning which include GI, urology, nephrology and oncology. She underwent emergent bilateral nephrostomy tube placement by IR and draining well now.   Subjective: Patient appears comfortable and did not had any new complaint when seen today.  She continued to have some bleeding per rectum.  Did not had appropriate elevation of hemoglobin with transfusion yesterday. Had her bilateral nephrostomy tubes placed successfully yesterday by IR, draining well.  Assessment & Plan:   Principal Problem:   GI bleed Active Problems:   Acute renal failure (HCC)   Obstructive uropathy   Colitis, acute   Bleeding per rectum  Lower GI bleed. Symptoms and imaging is concerning for extensive malignancy. Patient had unintentional weight loss.  Patient did not had appropriate response of hemoglobin with transfusion yesterday.  Might be a concentrated blood sample with dehydration. 2 more  units were ordered by oncology this morning.  Hemoglobin remained at 6.4 this morning.  Continue to have bleeding per rectum. Oncology is following-appreciate their recommendations.  Mildly elevated CEA with normal other tumor markers.  Anemia panel with anemia of chronic disease, no significant iron deficiency. Going for colonoscopy on Monday. Going for liver biopsy on Monday. -Monitor CBC. -Check anemia panel. -Transfuse if below 8.  Acute kidney injury. Most likely secondary to obstructive uropathy as evident on CT abdomen. Urology was also consulted and according to their advice IR was consulted for bilateral emergent nephrostomy tube placement which was done yesterday. Patient draining well from nephrostomy tubes, 1 with clear urine and second with hematuria. Urology signed off today-will need replacement of nephrostomy tubes in 4 to 6 weeks by IR. Nephrology consult was also placed-will appreciate their recommendations. Mild decrease in creatinine to 9.89 today.  Bicarb at 18.  Phosphorus at 6.8. -Continue with gentle IV hydration. -Monitor renal function. -Avoid nephrotoxins.  Anion gap metabolic acidosis.  Anion gap resolved.  Lactic acid within normal limit. Most likely secondary to severe acute AKI and GI bleed.  Mild improvement in bicarb.  Patient getting gentle IV fluid.  Also encouraging p.o. hydration. -Continue to monitor.  Objective: Vitals:   05/24/20 0930 05/24/20 0945 05/24/20 1000 05/24/20 1100  BP: 105/67 113/66 106/71 104/64  Pulse: 76 84 86 83  Resp:      Temp:      TempSrc:      SpO2: 99% 100% 99% 100%  Weight:      Height:        Intake/Output Summary (Last 24 hours) at 05/24/2020 1144 Last data filed at 05/24/2020 0656 Gross per 24 hour  Intake  50 ml  Output 2400 ml  Net -2350 ml   Filed Weights   05/22/20 1537  Weight: 59 kg    Examination:  General.  Well developed pleasant lady, in no acute distress. Pulmonary.  Lungs clear bilaterally,  normal respiratory effort. CV.  Regular rate and rhythm, no JVD, rub or murmur. Abdomen.  Soft, nontender, nondistended, BS positive, bilateral nephrostomy tubes in place. CNS.  Alert and oriented x3.  No focal neurologic deficit. Extremities.  No edema, no cyanosis, pulses intact and symmetrical. Psychiatry.  Judgment and insight appears normal.    DVT prophylaxis: SCDs Code Status: Full Family Communication: Discussed with patient.  She is communicating with her daughter herself, I called the daughter with no response again today. Disposition Plan:  Status is: Inpatient  Remains inpatient appropriate because:Inpatient level of care appropriate due to severity of illness   Dispo: The patient is from: Home              Anticipated d/c is to: Home              Anticipated d/c date is: 3 days              Patient currently is not medically stable to d/c.   Consultants:   GI  IR  Oncology  Nephrology  Procedures:  Antimicrobials:   Data Reviewed: I have personally reviewed following labs and imaging studies  CBC: Recent Labs  Lab 05/22/20 1542 05/23/20 1542 05/24/20 0233  WBC 11.4* 9.8 9.3  HGB 6.4* 6.9* 6.4*  HCT 20.0* 21.4* 19.4*  MCV 75.8* 77.0* 76.4*  PLT 456* 425* 161   Basic Metabolic Panel: Recent Labs  Lab 05/22/20 1542 05/23/20 1542 05/24/20 0343  NA 135 138 137  K 4.1 4.0 4.1  CL 103 107 108  CO2 16* 16* 18*  GLUCOSE 108* 100* 115*  BUN 83* 87* 84*  CREATININE 9.82* 10.74* 9.89*  CALCIUM 8.1* 8.3* 7.8*  PHOS  --   --  6.8*   GFR: Estimated Creatinine Clearance: 5.3 mL/min (A) (by C-G formula based on SCr of 9.89 mg/dL (H)). Liver Function Tests: Recent Labs  Lab 05/22/20 1542 05/24/20 0343  AST 14*  --   ALT 9  --   ALKPHOS 62  --   BILITOT 0.5  --   PROT 7.7  --   ALBUMIN 3.0* 2.5*   Recent Labs  Lab 05/22/20 1934  LIPASE 27   No results for input(s): AMMONIA in the last 168 hours. Coagulation Profile: Recent Labs  Lab  05/22/20 2313  INR 1.2   Cardiac Enzymes: No results for input(s): CKTOTAL, CKMB, CKMBINDEX, TROPONINI in the last 168 hours. BNP (last 3 results) No results for input(s): PROBNP in the last 8760 hours. HbA1C: No results for input(s): HGBA1C in the last 72 hours. CBG: No results for input(s): GLUCAP in the last 168 hours. Lipid Profile: No results for input(s): CHOL, HDL, LDLCALC, TRIG, CHOLHDL, LDLDIRECT in the last 72 hours. Thyroid Function Tests: No results for input(s): TSH, T4TOTAL, FREET4, T3FREE, THYROIDAB in the last 72 hours. Anemia Panel: Recent Labs    05/23/20 1542  VITAMINB12 529  FOLATE 7.9  FERRITIN 58  TIBC 223*  IRON 45  RETICCTPCT 0.9   Sepsis Labs: Recent Labs  Lab 05/22/20 1934 05/22/20 2312  LATICACIDVEN 1.0 0.6    Recent Results (from the past 240 hour(s))  Respiratory Panel by RT PCR (Flu A&B, Covid) - Nasopharyngeal Swab     Status:  None   Collection Time: 05/22/20  7:34 PM   Specimen: Nasopharyngeal Swab  Result Value Ref Range Status   SARS Coronavirus 2 by RT PCR NEGATIVE NEGATIVE Final    Comment: (NOTE) SARS-CoV-2 target nucleic acids are NOT DETECTED.  The SARS-CoV-2 RNA is generally detectable in upper respiratoy specimens during the acute phase of infection. The lowest concentration of SARS-CoV-2 viral copies this assay can detect is 131 copies/mL. A negative result does not preclude SARS-Cov-2 infection and should not be used as the sole basis for treatment or other patient management decisions. A negative result may occur with  improper specimen collection/handling, submission of specimen other than nasopharyngeal swab, presence of viral mutation(s) within the areas targeted by this assay, and inadequate number of viral copies (<131 copies/mL). A negative result must be combined with clinical observations, patient history, and epidemiological information. The expected result is Negative.  Fact Sheet for Patients:    PinkCheek.be  Fact Sheet for Healthcare Providers:  GravelBags.it  This test is no t yet approved or cleared by the [place] FDA and  has been authorized for detection and/or diagnosis of SARS-CoV-2 by FDA under an Emergency Use Authorization (EUA). This EUA will remain  in effect (meaning this test can be used) for the duration of the COVID-19 declaration under Section 564(b)(1) of the Act, 21 U.S.C. section 360bbb-3(b)(1), unless the authorization is terminated or revoked sooner.     Influenza A by PCR NEGATIVE NEGATIVE Final   Influenza B by PCR NEGATIVE NEGATIVE Final    Comment: (NOTE) The Xpert Xpress SARS-CoV-2/FLU/RSV assay is intended as an aid in  the diagnosis of influenza from Nasopharyngeal swab specimens and  should not be used as a sole basis for treatment. Nasal washings and  aspirates are unacceptable for Xpert Xpress SARS-CoV-2/FLU/RSV  testing.  Fact Sheet for Patients: PinkCheek.be  Fact Sheet for Healthcare Providers: GravelBags.it  This test is not yet approved or cleared by the [place] FDA and  has been authorized for detection and/or diagnosis of SARS-CoV-2 by  FDA under an Emergency Use Authorization (EUA). This EUA will remain  in effect (meaning this test can be used) for the duration of the  Covid-19 declaration under Section 564(b)(1) of the Act, 21  U.S.C. section 360bbb-3(b)(1), unless the authorization is  terminated or revoked. Performed at [facility], [address] [place], [place] [address]   Gastrointestinal Panel by PCR , Stool     Status: None   Collection Time: 05/24/20  1:00 AM   Specimen: Stool  Result Value Ref Range Status   Campylobacter species NOT DETECTED NOT DETECTED Final   Plesimonas shigelloides NOT DETECTED NOT DETECTED Final   Salmonella species NOT DETECTED NOT DETECTED Final    Yersinia enterocolitica NOT DETECTED NOT DETECTED Final   Vibrio species NOT DETECTED NOT DETECTED Final   Vibrio cholerae NOT DETECTED NOT DETECTED Final   Enteroaggregative E coli (EAEC) NOT DETECTED NOT DETECTED Final   Enteropathogenic E coli (EPEC) NOT DETECTED NOT DETECTED Final   Enterotoxigenic E coli (ETEC) NOT DETECTED NOT DETECTED Final   Shiga like toxin producing E coli (STEC) NOT DETECTED NOT DETECTED Final   Shigella/Enteroinvasive E coli (EIEC) NOT DETECTED NOT DETECTED Final   Cryptosporidium NOT DETECTED NOT DETECTED Final   Cyclospora cayetanensis NOT DETECTED NOT DETECTED Final   Entamoeba histolytica NOT DETECTED NOT DETECTED Final   Giardia lamblia NOT DETECTED NOT DETECTED Final   Adenovirus F40/41 NOT DETECTED NOT DETECTED Final  Astrovirus NOT DETECTED NOT DETECTED Final   Norovirus GI/GII NOT DETECTED NOT DETECTED Final   Rotavirus A NOT DETECTED NOT DETECTED Final   Sapovirus (I, II, IV, and V) NOT DETECTED NOT DETECTED Final    Comment: Performed at [facility], [place], [place] [address]  C Difficile Quick Screen w PCR reflex     Status: None   Collection Time: 05/24/20  1:00 AM   Specimen: Stool  Result Value Ref Range Status   C Diff antigen NEGATIVE NEGATIVE Final   C Diff toxin NEGATIVE NEGATIVE Final   C Diff interpretation No C. difficile detected.  Final    Comment: Performed at [facility], [address] [place], [place] [address]     Radiology Studies: CT ABDOMEN PELVIS WO CONTRAST  Result Date: 05/22/2020 CLINICAL DATA:  Intermittent rectal bleeding. EXAM: CT ABDOMEN AND PELVIS WITHOUT CONTRAST TECHNIQUE: Multidetector CT imaging of the abdomen and pelvis was performed following the standard protocol without IV contrast. COMPARISON:  None. FINDINGS: Lower chest: No acute abnormality. Hepatobiliary: A 2.9 cm x 3.1 cm ill-defined area of heterogeneous low attenuation is seen within the lateral aspect of the  liver dome (axial CT image 17, CT series number 2). An ill-defined, slightly exophytic 6.9 cm x 5.0 cm area of heterogeneous mildly decreased attenuation is seen within the posterior aspect of the right lobe of the liver. An ill-defined central area of low attenuation is noted within this region. No gallstones, gallbladder wall thickening, or biliary dilatation. Pancreas: Unremarkable. No pancreatic ductal dilatation or surrounding inflammatory changes. Spleen: Normal in size without focal abnormality. Adrenals/Urinary Tract: Adrenal glands are unremarkable. Kidneys are normal in size, without focal lesions. Marked severity bilateral hydronephrosis and hydroureter is seen. The urinary bladder is partially contracted and subsequently limited in evaluation. Stomach/Bowel: Stomach is within normal limits. Appendix appears normal. No evidence of bowel dilatation. There is marked severity thickening of the mid and distal sigmoid colon associated pericolonic inflammatory fat stranding is noted. Vascular/Lymphatic: No significant vascular findings are present. There is moderate severity para-aortic and aortocaval lymphadenopathy. The largest lymph node measures approximately 1.7 cm x 1.7 cm along the para-aortic region at the level of the kidney. It should be noted that this area is limited in evaluation in the absence of intravenous contrast. A 2.8 cm x 2.4 cm heterogeneous soft tissue mass is seen along the posterior aspect of the distal left iliac vessels (axial CT images 68 through 74, CT series number 2). Numerous subcentimeter soft tissue nodules are seen within the mesentery of the pelvis, along the midline (axial CT images 58 through 66, CT series number 2). Numerous additional small soft tissue nodules are seen surrounding the distal sigmoid colon and rectosigmoid junction. Reproductive: The uterus is mildly enlarged. The uterine fundus is positioned to the left of midline within the anterior aspect of the pelvis.  Other: No abdominal wall hernia or abnormality. A small amount of abdominal and pelvic fluid is noted. Musculoskeletal: No acute or significant osseous findings. IMPRESSION: 1. Marked severity sigmoid colitis. Due to the numerous areas of soft tissue nodularity within the mesentery in this region, an underlying neoplastic process cannot be excluded. Correlation with colonoscopy is recommended. 2. Heterogeneous liver masses, as described above, suspicious for an underlying neoplasm. MRI correlation is recommended. 3. Moderate severity para-aortic and aortocaval lymphadenopathy with an additional enlarged, irregular appearing lymph node seen along the posterior aspect of the distal left iliac vessels. This is also worrisome  for sequelae associated with an underlying neoplasm. 4. Marked severity bilateral hydronephrosis and hydroureter which likely represents partial renal obstruction secondary to the inflammatory and suspected neoplastic process seen within the pelvis. Electronically Signed   By: Virgina Norfolk M.D.   On: 05/22/2020 20:26   [place] Intraoperative  Result Date: 05/23/2020 INDICATION: Patient admitted with new diagnosis of malignancy of unknown etiology now with bilateral obstructive hydronephrosis. Request made for placement of bilateral percutaneous nephrostomy catheters for urinary diversion purposes. EXAM: 1. ULTRASOUND GUIDANCE FOR PUNCTURE OF THE BILATERAL RENAL COLLECTING SYSTEMS 2. BILATERAL PERCUTANEOUS NEPHROSTOMY TUBE PLACEMENT. COMPARISON:  CT abdomen and pelvis-05/22/2020 MEDICATIONS: Ancef 2 gm IV; The antibiotic was administered in an appropriate time frame prior to skin puncture. ANESTHESIA/SEDATION: Moderate (conscious) sedation was employed during this procedure. A total of Versed 4 mg and Fentanyl 100 mcg was administered intravenously. Moderate Sedation Time: 22 minutes. The patient's level of consciousness and vital signs were monitored continuously by radiology nursing  throughout the procedure under my direct supervision. CONTRAST:  A total of 20 cc Visipaque 320 was administered into both renal collecting systems. FLUOROSCOPY TIME:  1 minute, 42 seconds (81.0 mGy) COMPLICATIONS: None immediate. PROCEDURE: The procedure, risks, benefits, and alternatives were explained to the patient. Questions regarding the procedure were encouraged and answered. The patient understands and consents to the procedure. A timeout was performed prior to the initiation of the procedure. The bilateral flanks were prepped and draped in the usual sterile fashion and a sterile drape was applied covering the operative field. A sterile gown and sterile gloves were used for the procedure. Local anesthesia was provided with 1% Lidocaine with epinephrine. Attention was first paid towards placement of the left-sided percutaneous nephrostomy catheter. Ultrasound was used to localize the left kidney. Under direct ultrasound guidance, a 20 gauge needle was advanced into the renal collecting system. An ultrasound image documentation was performed. Access within the collecting system was confirmed with the efflux of urine followed by limited contrast injection. Over a Nitrex wire, the tract was dilated with an Accustick stent. Next, under intermittent fluoroscopic guidance and over a short Amplatz wire, the track was dilated ultimately allowing placement of a 10-French percutaneous nephrostomy catheter which was advanced to the level of the renal pelvis where the coil was formed and locked. Contrast was injected and several spot fluoroscopic images were obtained in various obliquities. The catheter was secured at the skin with a Prolene retention suture and stat lock device and connected to a gravity bag was placed. The identical procedure was repeated for the contralateral right kidney allowing placement of a 10 French percutaneous nephrostomy catheter with end ultimately coiled and locked within the right renal  pelvis. Dressings were applied. The patient tolerated procedure well without immediate postprocedural complication. 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 Under a combination of ultrasound and fluoroscopic guidance, a posterior inferior calix was targeted bilaterally allowing placement of bilateral 10 French percutaneous nephrostomy catheters with ends coiled and locked within the bilateral renal pelvises. Contrast injection confirmed appropriate positioning. IMPRESSION: Successful ultrasound and fluoroscopic guided placement of bilateral 10 French PCNs. Electronically Signed   By: Sandi Mariscal M.D.   On: 05/23/2020 12:55   US RENAL  Result Date: 05/23/2020 CLINICAL DATA:  Acute kidney injury EXAM: RENAL / URINARY TRACT ULTRASOUND COMPLETE COMPARISON:  CT 05/22/2020 FINDINGS: Right Kidney: Renal measurements: 11.5 x 5.2 x 5.5 cm =  volume: 170 mL. Mild renal cortical thinning with increased cortical echogenicity. Severe right hydronephrosis. The visualized right ureter is moderately dilated. No discrete shadowing stone or mass lesion identified. Left Kidney: Renal measurements: 12.5 x 5.2 x 4.7 cm = volume: 162 mL. Cortical thickness within normal limits. Increased renal cortical echogenicity. Moderate to severe hydronephrosis. Visualized left ureter is mildly dilated. No discrete shadowing stone or mass lesion identified. Bladder: Partially distended.  No definite abnormality. Other: Inferior right lower lobe solid intrahepatic mass measuring up to 6.0 cm. Irregular and heterogeneous appearance of the uterus is partially visualized on images within the pelvis, incompletely characterized. IMPRESSION: 1. Severe right hydroureteronephrosis. 2. Moderate to severe left hydronephrosis. 3. Increased renal cortical echogenicity bilaterally suggesting medical renal disease.  4. Very irregular appearance of visualized portion of the uterus noted on transabdominal imaging through the pelvis. Recommend dedicated pelvic ultrasound for further evaluation. 5. Solid 6.0 cm inferior right hepatic lobe mass most suggestive of metastatic disease based on the previous CT. Electronically Signed   By: Davina Poke D.O.   On: 05/23/2020 10:04   IR NEPHROSTOMY PLACEMENT LEFT  Result Date: 05/23/2020 INDICATION: Patient admitted with new diagnosis of malignancy of unknown etiology now with bilateral obstructive hydronephrosis. Request made for placement of bilateral percutaneous nephrostomy catheters for urinary diversion purposes. EXAM: 1. ULTRASOUND GUIDANCE FOR PUNCTURE OF THE BILATERAL RENAL COLLECTING SYSTEMS 2. BILATERAL PERCUTANEOUS NEPHROSTOMY TUBE PLACEMENT. COMPARISON:  CT abdomen and pelvis-05/22/2020 MEDICATIONS: Ancef 2 gm IV; The antibiotic was administered in an appropriate time frame prior to skin puncture. ANESTHESIA/SEDATION: Moderate (conscious) sedation was employed during this procedure. A total of Versed 4 mg and Fentanyl 100 mcg was administered intravenously. Moderate Sedation Time: 22 minutes. The patient's level of consciousness and vital signs were monitored continuously by radiology nursing throughout the procedure under my direct supervision. CONTRAST:  A total of 20 cc Visipaque 320 was administered into both renal collecting systems. FLUOROSCOPY TIME:  1 minute, 42 seconds (50.3 mGy) COMPLICATIONS: None immediate. PROCEDURE: The procedure, risks, benefits, and alternatives were explained to the patient. Questions regarding the procedure were encouraged and answered. The patient understands and consents to the procedure. A timeout was performed prior to the initiation of the procedure. The bilateral flanks were prepped and draped in the usual sterile fashion and a sterile drape was applied covering the operative field. A sterile gown and sterile gloves were used  for the procedure. Local anesthesia was provided with 1% Lidocaine with epinephrine. Attention was first paid towards placement of the left-sided percutaneous nephrostomy catheter. Ultrasound was used to localize the left kidney. Under direct ultrasound guidance, a 20 gauge needle was advanced into the renal collecting system. An ultrasound image documentation was performed. Access within the collecting system was confirmed with the efflux of urine followed by limited contrast injection. Over a Nitrex wire, the tract was dilated with an Accustick stent. Next, under intermittent fluoroscopic guidance and over a short Amplatz wire, the track was dilated ultimately allowing placement of a 10-French percutaneous nephrostomy catheter which was advanced to the level of the renal pelvis where the coil was formed and locked. Contrast was injected and several spot fluoroscopic images were obtained in various obliquities. The catheter was secured at the skin with a Prolene retention suture and stat lock device and connected to a gravity bag was placed. The identical procedure was repeated for the contralateral right kidney allowing placement of a 10 French percutaneous nephrostomy catheter with end ultimately coiled and locked within the  right renal pelvis. Dressings were applied. The patient tolerated procedure well without immediate postprocedural complication. 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 Under a combination of ultrasound and fluoroscopic guidance, a posterior inferior calix was targeted bilaterally allowing placement of bilateral 10 French percutaneous nephrostomy catheters with ends coiled and locked within the bilateral renal pelvises. Contrast injection confirmed appropriate positioning. IMPRESSION: Successful ultrasound and fluoroscopic guided placement of  bilateral 10 French PCNs. Electronically Signed   By: Sandi Mariscal M.D.   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 Request made for placement of bilateral percutaneous nephrostomy catheters for urinary diversion purposes. EXAM: 1. ULTRASOUND GUIDANCE FOR PUNCTURE OF THE BILATERAL RENAL COLLECTING SYSTEMS 2. BILATERAL PERCUTANEOUS NEPHROSTOMY TUBE PLACEMENT. COMPARISON:  CT abdomen and pelvis-05/22/2020 MEDICATIONS: Ancef 2 gm IV; The antibiotic was administered in an appropriate time frame prior to skin puncture. ANESTHESIA/SEDATION: Moderate (conscious) sedation was employed during this procedure. A total of Versed 4 mg and Fentanyl 100 mcg was administered intravenously. Moderate Sedation Time: 22 minutes. The patient's level of consciousness and vital signs were monitored continuously by radiology nursing throughout the procedure under my direct supervision. CONTRAST:  A total of 20 cc Visipaque 320 was administered into both renal collecting systems. FLUOROSCOPY TIME:  1 minute, 42 seconds (36.1 mGy) COMPLICATIONS: None immediate. PROCEDURE: The procedure, risks, benefits, and alternatives were explained to the patient. Questions regarding the procedure were encouraged and answered. The patient understands and consents to the procedure. A timeout was performed prior to the initiation of the procedure. The bilateral flanks were prepped and draped in the usual sterile fashion and a sterile drape was applied covering the operative field. A sterile gown and sterile gloves were used for the procedure. Local anesthesia was provided with 1% Lidocaine with epinephrine. Attention was first paid towards placement of the left-sided percutaneous nephrostomy catheter. Ultrasound was used to localize the left kidney. Under direct ultrasound guidance, a 20  gauge needle was advanced into the renal collecting system. An ultrasound image documentation was performed. Access within the collecting system was confirmed with the efflux of urine followed by limited contrast injection. Over a Nitrex wire, the tract was dilated with an Accustick stent. Next, under intermittent fluoroscopic guidance and over a short Amplatz wire, the track was dilated ultimately allowing placement of a 10-French percutaneous nephrostomy catheter which was advanced to the level of the renal pelvis where the coil was formed and locked. Contrast was injected and several spot fluoroscopic images were obtained in various obliquities. The catheter was secured at the skin with a Prolene retention suture and stat lock device and connected to a gravity bag was placed. The identical procedure was repeated for the contralateral right kidney allowing placement of a 10 French percutaneous nephrostomy catheter with end ultimately coiled and locked within the right renal pelvis. Dressings were applied. The patient tolerated procedure well without immediate postprocedural complication. 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 Under a combination of ultrasound and fluoroscopic guidance, a posterior inferior calix was targeted bilaterally allowing placement of bilateral 10 French percutaneous nephrostomy catheters with ends coiled and locked within the bilateral renal pelvises. Contrast injection confirmed appropriate positioning. IMPRESSION:  Successful ultrasound and fluoroscopic guided placement of bilateral 10 French PCNs. Electronically Signed   By: Sandi Mariscal M.D.   On: 05/23/2020 12:55    Scheduled Meds: . pantoprazole (PROTONIX) IV  40 mg Intravenous Q12H   Continuous Infusions: . lactated ringers 100 mL/hr at 05/24/20 0342     LOS: 2 days   Time  spent: 30 minutes. More than 50% of the time was spent with direct patient care and coordinating with other specialities.  Lorella Nimrod, MD [facility]  If 7PM-7AM, please contact night-coverage [email or web address]  05/24/2020, 11:44 AM   This record has been created using Systems analyst. Errors have been sought and corrected,but may not always be located. Such creation errors do not reflect on the standard of care.

## 2020-05-24 NOTE — Consult Note (Signed)
[place]  [phone]) [phone] [phone]) [phone]  ID: [name] OB: 10-May-1966  MR#: [id number]  JJK#:[id number]  Patient Care Team: Nyra Capes as PCP - General (Family Medicine)  CHIEF COMPLAINT: Rectal bleed, suspicion for malignancy acute renal failure.  INTERVAL HISTORY: Patient is a 54 year old female who presented to the emergency room complaining of 3 months of rectal bleeding that has become worse in the past several days.  She was noted to have a hemoglobin of 6.4 which has not appreciably improved with transfusion.  She was also noted to be in acute renal failure secondary to obstruction.  CT scan revealed severe sigmoid colitis, but could not rule out malignancy.  Patient was also noted to have a large liver lesion highly suspicious for metastatic disease.  She currently feels well.  She has no neurologic complaints.  She denies any recent fevers.  She has a good appetite and denies weight loss.  She has no chest pain, shortness of breath, cough, or hemoptysis.  She denies any nausea, vomiting, constipation, or diarrhea.  She has no urinary complaints.  Patient offers no further specific complaints today.  REVIEW OF SYSTEMS:   Review of Systems  Constitutional: Negative.  Negative for chills, fever and malaise/fatigue.  Respiratory: Negative.  Negative for cough and shortness of breath.   Cardiovascular: Negative.  Negative for chest pain and leg swelling.  Gastrointestinal: Positive for blood in stool. Negative for abdominal pain.  Genitourinary: Positive for hematuria.  Musculoskeletal: Negative.  Negative for back pain.  Skin: Negative.  Negative for rash.  Neurological: Negative.  Negative for dizziness, focal weakness, weakness and headaches.  Psychiatric/Behavioral: Negative.  The patient is not nervous/anxious.     As per HPI. Otherwise, a complete review of systems is negative.  PAST MEDICAL HISTORY: History  reviewed. No pertinent past medical history.  PAST SURGICAL HISTORY: Past Surgical History:  Procedure Laterality Date   IR NEPHROSTOMY PLACEMENT LEFT  05/23/2020   IR NEPHROSTOMY PLACEMENT RIGHT  05/23/2020    FAMILY HISTORY: Family History  Problem Relation Age of Onset   Other Mother        Corticobasal degeneration   Cancer Mother        Uterine   Diabetes Maternal Grandmother    Stroke Maternal Grandfather    Stroke Paternal Grandfather    Heart disease Father     ADVANCED DIRECTIVES (Y/N):  @ADVDIR @  HEALTH MAINTENANCE: Social History   Tobacco Use   Smoking status: Never Smoker   Smokeless tobacco: Never Used  Substance Use Topics   Alcohol use: Never   Drug use: Never     Colonoscopy:  PAP:  Bone density:  Lipid panel:  No Known Allergies  Current Facility-Administered Medications  Medication Dose Route Frequency Provider Last Rate Last Admin   0.9 %  sodium chloride infusion (Manually program via Guardrails IV Fluids)   Intravenous Once [name], MD       acetaminophen (TYLENOL) tablet 650 mg  650 mg Oral Once [name], MD       ceFAZolin (ANCEF) IVPB 2g/100 mL premix  2 g Intravenous to XRAY [place], Roselyn Reef R, NP       diphenhydrAMINE (BENADRYL) injection 25 mg  25 mg Intravenous Once [name], MD       lactated ringers infusion   Intravenous Continuous Elwyn Reach, MD 100 mL/hr at 05/24/20 0342 New Bag at 05/24/20 0342   ondansetron (ZOFRAN) tablet 4 mg  4 mg Oral Q6H PRN Elwyn Reach, MD       Or   ondansetron (ZOFRAN) injection 4 mg  4 mg Intravenous Q6H PRN Elwyn Reach, MD       pantoprazole (PROTONIX) injection 40 mg  40 mg Intravenous Q12H Elwyn Reach, MD   40 mg at 05/24/20 0018   Current Outpatient Medications  Medication Sig Dispense Refill   acetaminophen (TYLENOL) 500 MG tablet Take 1,000 mg by mouth every 6 (six) hours as needed. (Patient not taking: Reported on  05/22/2020)      OBJECTIVE: Vitals:   05/24/20 0300 05/24/20 0545  BP: 106/71 110/74  Pulse: 83 79  Resp:  17  Temp:    SpO2: 98% 99%     Body mass index is 25.39 kg/m.    ECOG FS:1 - Symptomatic but completely ambulatory  General: Well-developed, well-nourished, no acute distress. Eyes: Pink conjunctiva, anicteric sclera. HEENT: Normocephalic, moist mucous membranes. Lungs: No audible wheezing or coughing. Heart: Regular rate and rhythm. Abdomen: Soft, nontender, no obvious distention. Musculoskeletal: No edema, cyanosis, or clubbing. Neuro: Alert, answering all questions appropriately. Cranial nerves grossly intact. Skin: No rashes or petechiae noted. Psych: Normal affect. Lymphatics: No cervical, calvicular, axillary or inguinal LAD.   LAB RESULTS:  Lab Results  Component Value Date   NA 137 05/24/2020   K 4.1 05/24/2020   CL 108 05/24/2020   CO2 18 (L) 05/24/2020   GLUCOSE 115 (H) 05/24/2020   BUN 84 (H) 05/24/2020   CREATININE 9.89 (H) 05/24/2020   CALCIUM 7.8 (L) 05/24/2020   PROT 7.7 05/22/2020   ALBUMIN 2.5 (L) 05/24/2020   AST 14 (L) 05/22/2020   ALT 9 05/22/2020   ALKPHOS 62 05/22/2020   BILITOT 0.5 05/22/2020   GFRNONAA 4 (L) 05/24/2020    Lab Results  Component Value Date   WBC 9.3 05/24/2020   NEUTROABS 5.2 09/04/2018   HGB 6.4 (L) 05/24/2020   HCT 19.4 (L) 05/24/2020   MCV 76.4 (L) 05/24/2020   PLT 392 05/24/2020     STUDIES: CT ABDOMEN PELVIS WO CONTRAST  Result Date: 05/22/2020 CLINICAL DATA:  Intermittent rectal bleeding. EXAM: CT ABDOMEN AND PELVIS WITHOUT CONTRAST TECHNIQUE: Multidetector CT imaging of the abdomen and pelvis was performed following the standard protocol without IV contrast. COMPARISON:  None. FINDINGS: Lower chest: No acute abnormality. Hepatobiliary: A 2.9 cm x 3.1 cm ill-defined area of heterogeneous low attenuation is seen within the lateral aspect of the liver dome (axial CT image 17, CT series number 2). An  ill-defined, slightly exophytic 6.9 cm x 5.0 cm area of heterogeneous mildly decreased attenuation is seen within the posterior aspect of the right lobe of the liver. An ill-defined central area of low attenuation is noted within this region. No gallstones, gallbladder wall thickening, or biliary dilatation. Pancreas: Unremarkable. No pancreatic ductal dilatation or surrounding inflammatory changes. Spleen: Normal in size without focal abnormality. Adrenals/Urinary Tract: Adrenal glands are unremarkable. Kidneys are normal in size, without focal lesions. Marked severity bilateral hydronephrosis and hydroureter is seen. The urinary bladder is partially contracted and subsequently limited in evaluation. Stomach/Bowel: Stomach is within normal limits. Appendix appears normal. No evidence of bowel dilatation. There is marked severity thickening of the mid and distal sigmoid colon associated pericolonic inflammatory fat stranding is noted. Vascular/Lymphatic: No significant vascular findings are present. There is moderate severity para-aortic and aortocaval lymphadenopathy. The largest lymph node measures approximately 1.7 cm x 1.7 cm along the para-aortic region at the  level of the kidney. It should be noted that this area is limited in evaluation in the absence of intravenous contrast. A 2.8 cm x 2.4 cm heterogeneous soft tissue mass is seen along the posterior aspect of the distal left iliac vessels (axial CT images 68 through 74, CT series number 2). Numerous subcentimeter soft tissue nodules are seen within the mesentery of the pelvis, along the midline (axial CT images 58 through 66, CT series number 2). Numerous additional small soft tissue nodules are seen surrounding the distal sigmoid colon and rectosigmoid junction. Reproductive: The uterus is mildly enlarged. The uterine fundus is positioned to the left of midline within the anterior aspect of the pelvis. Other: No abdominal wall hernia or abnormality. A  small amount of abdominal and pelvic fluid is noted. Musculoskeletal: No acute or significant osseous findings. IMPRESSION: 1. Marked severity sigmoid colitis. Due to the numerous areas of soft tissue nodularity within the mesentery in this region, an underlying neoplastic process cannot be excluded. Correlation with colonoscopy is recommended. 2. Heterogeneous liver masses, as described above, suspicious for an underlying neoplasm. MRI correlation is recommended. 3. Moderate severity para-aortic and aortocaval lymphadenopathy with an additional enlarged, irregular appearing lymph node seen along the posterior aspect of the distal left iliac vessels. This is also worrisome for sequelae associated with an underlying neoplasm. 4. Marked severity bilateral hydronephrosis and hydroureter which likely represents partial renal obstruction secondary to the inflammatory and suspected neoplastic process seen within the pelvis. Electronically Signed   By: Virgina Norfolk M.D.   On: 05/22/2020 20:26   [place] Intraoperative  Result Date: 05/23/2020 INDICATION: Patient admitted with new diagnosis of malignancy of unknown etiology now with bilateral obstructive hydronephrosis. Request made for placement of bilateral percutaneous nephrostomy catheters for urinary diversion purposes. EXAM: 1. ULTRASOUND GUIDANCE FOR PUNCTURE OF THE BILATERAL RENAL COLLECTING SYSTEMS 2. BILATERAL PERCUTANEOUS NEPHROSTOMY TUBE PLACEMENT. COMPARISON:  CT abdomen and pelvis-05/22/2020 MEDICATIONS: Ancef 2 gm IV; The antibiotic was administered in an appropriate time frame prior to skin puncture. ANESTHESIA/SEDATION: Moderate (conscious) sedation was employed during this procedure. A total of Versed 4 mg and Fentanyl 100 mcg was administered intravenously. Moderate Sedation Time: 22 minutes. The patient's level of consciousness and vital signs were monitored continuously by radiology nursing throughout the procedure under my direct supervision.  CONTRAST:  A total of 20 cc Visipaque 320 was administered into both renal collecting systems. FLUOROSCOPY TIME:  1 minute, 42 seconds (02.5 mGy) COMPLICATIONS: None immediate. PROCEDURE: The procedure, risks, benefits, and alternatives were explained to the patient. Questions regarding the procedure were encouraged and answered. The patient understands and consents to the procedure. A timeout was performed prior to the initiation of the procedure. The bilateral flanks were prepped and draped in the usual sterile fashion and a sterile drape was applied covering the operative field. A sterile gown and sterile gloves were used for the procedure. Local anesthesia was provided with 1% Lidocaine with epinephrine. Attention was first paid towards placement of the left-sided percutaneous nephrostomy catheter. Ultrasound was used to localize the left kidney. Under direct ultrasound guidance, a 20 gauge needle was advanced into the renal collecting system. An ultrasound image documentation was performed. Access within the collecting system was confirmed with the efflux of urine followed by limited contrast injection. Over a Nitrex wire, the tract was dilated with an Accustick stent. Next, under intermittent fluoroscopic guidance and over a short Amplatz wire, the track was dilated ultimately allowing placement of a 10-French percutaneous nephrostomy catheter which  was advanced to the level of the renal pelvis where the coil was formed and locked. Contrast was injected and several spot fluoroscopic images were obtained in various obliquities. The catheter was secured at the skin with a Prolene retention suture and stat lock device and connected to a gravity bag was placed. The identical procedure was repeated for the contralateral right kidney allowing placement of a 10 French percutaneous nephrostomy catheter with end ultimately coiled and locked within the right renal pelvis. Dressings were applied. The patient tolerated  procedure well without immediate postprocedural complication. 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 Under a combination of ultrasound and fluoroscopic guidance, a posterior inferior calix was targeted bilaterally allowing placement of bilateral 10 French percutaneous nephrostomy catheters with ends coiled and locked within the bilateral renal pelvises. Contrast injection confirmed appropriate positioning. IMPRESSION: Successful ultrasound and fluoroscopic guided placement of bilateral 10 French PCNs. Electronically Signed   By: Sandi Mariscal M.D.   On: 05/23/2020 12:55   US RENAL  Result Date: 05/23/2020 CLINICAL DATA:  Acute kidney injury EXAM: RENAL / URINARY TRACT ULTRASOUND COMPLETE COMPARISON:  CT 05/22/2020 FINDINGS: Right Kidney: Renal measurements: 11.5 x 5.2 x 5.5 cm = volume: 170 mL. Mild renal cortical thinning with increased cortical echogenicity. Severe right hydronephrosis. The visualized right ureter is moderately dilated. No discrete shadowing stone or mass lesion identified. Left Kidney: Renal measurements: 12.5 x 5.2 x 4.7 cm = volume: 162 mL. Cortical thickness within normal limits. Increased renal cortical echogenicity. Moderate to severe hydronephrosis. Visualized left ureter is mildly dilated. No discrete shadowing stone or mass lesion identified. Bladder: Partially distended.  No definite abnormality. Other: Inferior right lower lobe solid intrahepatic mass measuring up to 6.0 cm. Irregular and heterogeneous appearance of the uterus is partially visualized on images within the pelvis, incompletely characterized. IMPRESSION: 1. Severe right hydroureteronephrosis. 2. Moderate to severe left hydronephrosis. 3. Increased renal cortical echogenicity bilaterally suggesting medical renal disease. 4. Very irregular appearance of visualized portion of  the uterus noted on transabdominal imaging through the pelvis. Recommend dedicated pelvic ultrasound for further evaluation. 5. Solid 6.0 cm inferior right hepatic lobe mass most suggestive of metastatic disease based on the previous CT. Electronically Signed   By: Davina Poke D.O.   On: 05/23/2020 10:04   IR NEPHROSTOMY PLACEMENT LEFT  Result Date: 05/23/2020 INDICATION: Patient admitted with new diagnosis of malignancy of unknown etiology now with bilateral obstructive hydronephrosis. Request made for placement of bilateral percutaneous nephrostomy catheters for urinary diversion purposes. EXAM: 1. ULTRASOUND GUIDANCE FOR PUNCTURE OF THE BILATERAL RENAL COLLECTING SYSTEMS 2. BILATERAL PERCUTANEOUS NEPHROSTOMY TUBE PLACEMENT. COMPARISON:  CT abdomen and pelvis-05/22/2020 MEDICATIONS: Ancef 2 gm IV; The antibiotic was administered in an appropriate time frame prior to skin puncture. ANESTHESIA/SEDATION: Moderate (conscious) sedation was employed during this procedure. A total of Versed 4 mg and Fentanyl 100 mcg was administered intravenously. Moderate Sedation Time: 22 minutes. The patient's level of consciousness and vital signs were monitored continuously by radiology nursing throughout the procedure under my direct supervision. CONTRAST:  A total of 20 cc Visipaque 320 was administered into both renal collecting systems. FLUOROSCOPY TIME:  1 minute, 42 seconds (16.1 mGy) COMPLICATIONS: None immediate. PROCEDURE: The procedure, risks, benefits, and alternatives were explained to the patient. Questions regarding the procedure were encouraged and answered. The patient understands and consents to the procedure. A timeout was performed prior to  the initiation of the procedure. The bilateral flanks were prepped and draped in the usual sterile fashion and a sterile drape was applied covering the operative field. A sterile gown and sterile gloves were used for the procedure. Local anesthesia was provided with  1% Lidocaine with epinephrine. Attention was first paid towards placement of the left-sided percutaneous nephrostomy catheter. Ultrasound was used to localize the left kidney. Under direct ultrasound guidance, a 20 gauge needle was advanced into the renal collecting system. An ultrasound image documentation was performed. Access within the collecting system was confirmed with the efflux of urine followed by limited contrast injection. Over a Nitrex wire, the tract was dilated with an Accustick stent. Next, under intermittent fluoroscopic guidance and over a short Amplatz wire, the track was dilated ultimately allowing placement of a 10-French percutaneous nephrostomy catheter which was advanced to the level of the renal pelvis where the coil was formed and locked. Contrast was injected and several spot fluoroscopic images were obtained in various obliquities. The catheter was secured at the skin with a Prolene retention suture and stat lock device and connected to a gravity bag was placed. The identical procedure was repeated for the contralateral right kidney allowing placement of a 10 French percutaneous nephrostomy catheter with end ultimately coiled and locked within the right renal pelvis. Dressings were applied. The patient tolerated procedure well without immediate postprocedural complication. 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 Under a combination of ultrasound and fluoroscopic guidance, a posterior inferior calix was targeted bilaterally allowing placement of bilateral 10 French percutaneous nephrostomy catheters with ends coiled and locked within the bilateral renal pelvises. Contrast injection confirmed appropriate positioning. IMPRESSION: Successful ultrasound and fluoroscopic guided placement of bilateral 10 French PCNs. Electronically Signed   By: Sandi Mariscal M.D.   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 Request made for placement of bilateral percutaneous nephrostomy catheters for urinary diversion purposes. EXAM: 1. ULTRASOUND GUIDANCE FOR PUNCTURE OF THE BILATERAL RENAL COLLECTING SYSTEMS 2. BILATERAL PERCUTANEOUS NEPHROSTOMY TUBE PLACEMENT. COMPARISON:  CT abdomen and pelvis-05/22/2020 MEDICATIONS: Ancef 2 gm IV; The antibiotic was administered in an appropriate time frame prior to skin puncture. ANESTHESIA/SEDATION: Moderate (conscious) sedation was employed during this procedure. A total of Versed 4 mg and Fentanyl 100 mcg was administered intravenously. Moderate Sedation Time: 22 minutes. The patient's level of consciousness and vital signs were monitored continuously by radiology nursing throughout the procedure under my direct supervision. CONTRAST:  A total of 20 cc Visipaque 320 was administered into both renal collecting systems. FLUOROSCOPY TIME:  1 minute, 42 seconds (51.0 mGy) COMPLICATIONS: None immediate. PROCEDURE: The procedure, risks, benefits, and alternatives were explained to the patient. Questions regarding the procedure were encouraged and answered. The patient understands and consents to the procedure. A timeout was performed prior to the initiation of the procedure. The bilateral flanks were prepped and draped in the usual sterile fashion and a sterile drape was applied covering the operative field. A sterile gown and sterile gloves were used for the procedure. Local anesthesia was provided with 1% Lidocaine with epinephrine. Attention was first paid towards placement of the left-sided percutaneous nephrostomy catheter. Ultrasound was used to localize the left kidney. Under direct ultrasound guidance, a 20 gauge needle was advanced into the renal  collecting system. An  ultrasound image documentation was performed. Access within the collecting system was confirmed with the efflux of urine followed by limited contrast injection. Over a Nitrex wire, the tract was dilated with an Accustick stent. Next, under intermittent fluoroscopic guidance and over a short Amplatz wire, the track was dilated ultimately allowing placement of a 10-French percutaneous nephrostomy catheter which was advanced to the level of the renal pelvis where the coil was formed and locked. Contrast was injected and several spot fluoroscopic images were obtained in various obliquities. The catheter was secured at the skin with a Prolene retention suture and stat lock device and connected to a gravity bag was placed. The identical procedure was repeated for the contralateral right kidney allowing placement of a 10 French percutaneous nephrostomy catheter with end ultimately coiled and locked within the right renal pelvis. Dressings were applied. The patient tolerated procedure well without immediate postprocedural complication. 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 Under a combination of ultrasound and fluoroscopic guidance, a posterior inferior calix was targeted bilaterally allowing placement of bilateral 10 French percutaneous nephrostomy catheters with ends coiled and locked within the bilateral renal pelvises. Contrast injection confirmed appropriate positioning. IMPRESSION: Successful ultrasound and fluoroscopic guided placement of bilateral 10 French PCNs. Electronically Signed   By: Sandi Mariscal M.D.   On: 05/23/2020 12:55    ASSESSMENT: Rectal bleed, suspicion for malignancy acute renal failure.  PLAN:    1.  Rectal bleed/possible malignancy: CT scan results reviewed independently and report as above highly suspicious for underlying malignancy with  possible metastatic lesion in liver.  Patient also has significant bilateral renal obstruction requiring nephrostomy tubes.  Appreciate GI input and patient will require colonoscopy in the near future.  Ultrasound-guided liver biopsy is scheduled for Monday for possible diagnosis.  Patient only has a mildly elevated CEA of 9.9.  The remainder of her tumor markers are within normal limits. 2.  Anemia: Despite transfusion, patient's hemoglobin has remained at 6.4 possibly indicating she was hemoconcentrated upon arrival.  She has mild hematuria in her nephrostomy bags.  Nursing does not report any significant rectal bleeding.  Have ordered 2 additional units for transfusion today.  Maintain hemoglobin greater than 7.0. 3.  Acute renal failure: Secondary to obstruction.  Patient has bilateral nephrostomy tubes, but continues to make urine.  Nephrology consult is pending.  Appreciate consult, will follow.   [name], MD   05/24/2020 7:06 AM

## 2020-05-24 NOTE — Consult Note (Signed)
Anita Sullivan MRN: [id number] DOB/AGE: [birth year] 54 y.o. Primary Care Physician:Lane, Lilia Argue, PA-C Admit date: 05/22/2020 Chief Complaint:  Chief Complaint  Patient presents with  . Rectal Bleeding   HPI:   Patient is a 54 year old Caucasian female with no significant past medical history who presented to the ER with chief complaint of rectal bleeding.  History of present illness date back to past 3 months when patient started having rectal bleeding.  It was intermittent in nature but yesterday patient said she had a large amount of bleeding that is the reason she came to the ER. Upon evaluation in the ER her hemoglobin was noted to be 6.4 g with an MCV of 75.8 and a CMP demonstrated normal liver function tests but a creatinine of 9.82 and a CO2 of 16.Large amount of blood and leukocytes in the urine.patient also underwent a CT scan of the abdomen and pelvis that demonstrated marked severe sigmoid colitis numerous, numerous areas of soft tissue nodularity within the mesentery suggestive of an underlying neoplastic process that cannot be excluded heterogeneous liver masses noted suspicious for a neoplasm.  Moderately severe Faera aortic and aortocaval lymphadenopathy noted marked severe bilateral hydronephrosis and hydroureter representing partial renal obstruction.  Patient was seen today in the ER. Patient offers no specific complaint. No complaint of fever cough or chills No complaint of chest pain/shortness of breath No prior nausea or vomiting Patient main complaint in today visit was " look I had these tubes placed in my back."    History reviewed. No pertinent past medical history.    Family History  Problem Relation Age of Onset  . Other Mother        Corticobasal degeneration  . Cancer Mother        Uterine  . Diabetes Maternal Grandmother   . Stroke Maternal Grandfather   . Stroke Paternal Grandfather   . Heart disease Father   No family history of  ESRD   Social History:  reports that she has never smoked. She has never used smokeless tobacco. She reports that she does not drink alcohol and does not use drugs.   Allergies: No Known Allergies  (Not in a hospital admission)      KDX:IPJAS from the symptoms mentioned above,there are no other symptoms referable to all systems reviewed.  . pantoprazole (PROTONIX) IV  40 mg Intravenous Q12H         NKN:LZJQB from the symptoms mentioned above,there are no other symptoms referable to all systems reviewed.  Physical Exam: Vital signs in last 24 hours: Temp:  [97.8 F (36.6 C)-98.7 F (37.1 C)] 98.4 F (36.9 C) (10/23 1750) Pulse Rate:  [76-95] 88 (10/23 1750) Resp:  [16-20] 16 (10/23 1750) BP: (95-130)/(55-82) 111/55 (10/23 1750) SpO2:  [97 %-100 %] 100 % (10/23 1750) Weight change:     Intake/Output from previous day: 10/22 0701 - 10/23 0700 In: 50 [IV Piggyback:50] Out: 2400 [Urine:2400] No intake/output data recorded.   Physical Exam: General- pt is awake,alert, oriented to time place and person Resp- No acute REsp distress, CTA B/L NO Rhonchi Nephrostomy tubes in situ bilaterally with clear urine CVS- S1S2 regular ij rate and rhythm GIT- BS+, soft, NT, ND EXT- NO LE Edema, Cyanosis CNS- CN 2-12 grossly intact. Moving all 4 extremities Psych- normal mod and affect    Lab Results: CBC Recent Labs    05/23/20 1542 05/24/20 0233  WBC 9.8 9.3  HGB 6.9* 6.4*  HCT 21.4* 19.4*  PLT 425* 392  BMET Recent Labs    05/23/20 1542 05/24/20 0343  NA 138 137  K 4.0 4.1  CL 107 108  CO2 16* 18*  GLUCOSE 100* 115*  BUN 87* 84*  CREATININE 10.74* 9.89*  CALCIUM 8.3* 7.8*    Creat trend 2021 10.7=>9.8 2018 0.6   MICRO Recent Results (from the past 240 hour(s))  Respiratory Panel by RT PCR (Flu A&B, Covid) - Nasopharyngeal Swab     Status: None   Collection Time: 05/22/20  7:34 PM   Specimen: Nasopharyngeal Swab  Result Value Ref Range  Status   SARS Coronavirus 2 by RT PCR NEGATIVE NEGATIVE Final    Comment: (NOTE) SARS-CoV-2 target nucleic acids are NOT DETECTED.  The SARS-CoV-2 RNA is generally detectable in upper respiratoy specimens during the acute phase of infection. The lowest concentration of SARS-CoV-2 viral copies this assay can detect is 131 copies/mL. A negative result does not preclude SARS-Cov-2 infection and should not be used as the sole basis for treatment or other patient management decisions. A negative result may occur with  improper specimen collection/handling, submission of specimen other than nasopharyngeal swab, presence of viral mutation(s) within the areas targeted by this assay, and inadequate number of viral copies (<131 copies/mL). A negative result must be combined with clinical observations, patient history, and epidemiological information. The expected result is Negative.  Fact Sheet for Patients:  PinkCheek.be  Fact Sheet for Healthcare Providers:  GravelBags.it  This test is no t yet approved or cleared by the [place] FDA and  has been authorized for detection and/or diagnosis of SARS-CoV-2 by FDA under an Emergency Use Authorization (EUA). This EUA will remain  in effect (meaning this test can be used) for the duration of the COVID-19 declaration under Section 564(b)(1) of the Act, 21 U.S.C. section 360bbb-3(b)(1), unless the authorization is terminated or revoked sooner.     Influenza A by PCR NEGATIVE NEGATIVE Final   Influenza B by PCR NEGATIVE NEGATIVE Final    Comment: (NOTE) The Xpert Xpress SARS-CoV-2/FLU/RSV assay is intended as an aid in  the diagnosis of influenza from Nasopharyngeal swab specimens and  should not be used as a sole basis for treatment. Nasal washings and  aspirates are unacceptable for Xpert Xpress SARS-CoV-2/FLU/RSV  testing.  Fact Sheet for  Patients: PinkCheek.be  Fact Sheet for Healthcare Providers: GravelBags.it  This test is not yet approved or cleared by the [place] FDA and  has been authorized for detection and/or diagnosis of SARS-CoV-2 by  FDA under an Emergency Use Authorization (EUA). This EUA will remain  in effect (meaning this test can be used) for the duration of the  Covid-19 declaration under Section 564(b)(1) of the Act, 21  U.S.C. section 360bbb-3(b)(1), unless the authorization is  terminated or revoked. Performed at [facility], [address] [place], [place] [address]   Gastrointestinal Panel by PCR , Stool     Status: None   Collection Time: 05/24/20  1:00 AM   Specimen: Stool  Result Value Ref Range Status   Campylobacter species NOT DETECTED NOT DETECTED Final   Plesimonas shigelloides NOT DETECTED NOT DETECTED Final   Salmonella species NOT DETECTED NOT DETECTED Final   Yersinia enterocolitica NOT DETECTED NOT DETECTED Final   Vibrio species NOT DETECTED NOT DETECTED Final   Vibrio cholerae NOT DETECTED NOT DETECTED Final   Enteroaggregative E coli (EAEC) NOT DETECTED NOT DETECTED Final   Enteropathogenic E coli (EPEC) NOT DETECTED NOT DETECTED Final   Enterotoxigenic  E coli (ETEC) NOT DETECTED NOT DETECTED Final   Shiga like toxin producing E coli (STEC) NOT DETECTED NOT DETECTED Final   Shigella/Enteroinvasive E coli (EIEC) NOT DETECTED NOT DETECTED Final   Cryptosporidium NOT DETECTED NOT DETECTED Final   Cyclospora cayetanensis NOT DETECTED NOT DETECTED Final   Entamoeba histolytica NOT DETECTED NOT DETECTED Final   Giardia lamblia NOT DETECTED NOT DETECTED Final   Adenovirus F40/41 NOT DETECTED NOT DETECTED Final   Astrovirus NOT DETECTED NOT DETECTED Final   Norovirus GI/GII NOT DETECTED NOT DETECTED Final   Rotavirus A NOT DETECTED NOT DETECTED Final   Sapovirus (I, II, IV, and V) NOT DETECTED NOT DETECTED  Final    Comment: Performed at [facility], [address] [place], [place] [address]  C Difficile Quick Screen w PCR reflex     Status: None   Collection Time: 05/24/20  1:00 AM   Specimen: Stool  Result Value Ref Range Status   C Diff antigen NEGATIVE NEGATIVE Final   C Diff toxin NEGATIVE NEGATIVE Final   C Diff interpretation No C. difficile detected.  Final    Comment: Performed at [facility], [address] [place], [place] [address]      Lab Results  Component Value Date   CALCIUM 7.8 (L) 05/24/2020   PHOS 6.8 (H) 05/24/2020   CT abdomen showed Kidneys are normal in size, without focal lesions. Marked severity bilateral hydronephrosis and hydroureter is seen. The urinary bladder is partially contracted and subsequently limited in evaluation.  Impression:   1)Renal  AKI secondary to obstruction Pt is now s/p Bilateral nephrostomy tube placement  . Patient creatinine is trending down No uremic signs or symptoms at this time No need for renal replacement therapy today  2)Anemia secondary to chronic disease Possible role of GI bleed Possibly for iron deficiency anemia as patient iron saturation is low at 20%  HGb is not at goal (9--11) Patient is receiving PRBC  3) secondary hyprtparathyroidism CKD Mineral-Bone Disorder.  Secondary hyperparathyroidism-present Patient phosphorus on the higher side We will start patient on calcium-based binders that should help with low calcium and high phosphorus  4) lower GI bleed GI team and primary team are following  5) electrolytes Normokalemic NOrmonatremic    6)Acid base Co2 is not at goal  Patient has non anion gap acidosis We will start patient on p.o. bicarb   Plan:  No need for renal replacement therapy today We will start patient on p.o. bicarb We will start patient on calcium-based binders that will help with low calcium and high phosphorus I did educate patient about her kidney  related issues Patient voiced understanding     Nasrin Lanzo s The[facility] Hawthorne 05/24/2020, 7:10 PM

## 2020-05-24 NOTE — ED Notes (Signed)
Pt ambulatory with steady gait noted to bathroom to change gowns and underwear. Chuck in bed changed.

## 2020-05-24 NOTE — ED Notes (Signed)
Nephrologist at bedside

## 2020-05-25 DIAGNOSIS — K625 Hemorrhage of anus and rectum: Secondary | ICD-10-CM | POA: Diagnosis present

## 2020-05-25 LAB — TYPE AND SCREEN
ABO/RH(D): A NEG
Antibody Screen: NEGATIVE
Unit division: 0
Unit division: 0
Unit division: 0

## 2020-05-25 LAB — BPAM RBC
Blood Product Expiration Date: 202110262359
Blood Product Expiration Date: 202111012359
Blood Product Expiration Date: 202111012359
ISSUE DATE / TIME: 202110212033
ISSUE DATE / TIME: 202110230827
ISSUE DATE / TIME: 202110231358
Unit Type and Rh: 600
Unit Type and Rh: 600
Unit Type and Rh: 600

## 2020-05-25 LAB — RENAL FUNCTION PANEL
Albumin: 2.4 g/dL — ABNORMAL LOW (ref 3.5–5.0)
Anion gap: 12 (ref 5–15)
BUN: 68 mg/dL — ABNORMAL HIGH (ref 6–20)
CO2: 18 mmol/L — ABNORMAL LOW (ref 22–32)
Calcium: 7.8 mg/dL — ABNORMAL LOW (ref 8.9–10.3)
Chloride: 109 mmol/L (ref 98–111)
Creatinine, Ser: 7.27 mg/dL — ABNORMAL HIGH (ref 0.44–1.00)
GFR, Estimated: 6 mL/min — ABNORMAL LOW (ref >60)
Glucose, Bld: 93 mg/dL (ref 70–99)
Phosphorus: 5.5 mg/dL — ABNORMAL HIGH (ref 2.5–4.6)
Potassium: 3.6 mmol/L (ref 3.5–5.1)
Sodium: 139 mmol/L (ref 135–145)

## 2020-05-25 LAB — CBC
HCT: 27.5 % — ABNORMAL LOW (ref 36.0–46.0)
Hemoglobin: 9.1 g/dL — ABNORMAL LOW (ref 12.0–15.0)
MCH: 26.5 pg (ref 26.0–34.0)
MCHC: 33.1 g/dL (ref 30.0–36.0)
MCV: 79.9 fL — ABNORMAL LOW (ref 80.0–100.0)
Platelets: 354 10*3/uL (ref 150–400)
RBC: 3.44 MIL/uL — ABNORMAL LOW (ref 3.87–5.11)
RDW: 16.5 % — ABNORMAL HIGH (ref 11.5–15.5)
WBC: 9.7 10*3/uL (ref 4.0–10.5)
nRBC: 0 % (ref 0.0–0.2)

## 2020-05-25 NOTE — Progress Notes (Signed)
Anita Sullivan  MRN: [id number]  DOB/AGE: [birth year] 54 y.o.  Primary Care Physician:Lane, Lilia Argue, PA-C  Admit date: 05/22/2020  Chief Complaint:  Chief Complaint  Patient presents with  . Rectal Bleeding    S-Pt presented on  05/22/2020 with  Chief Complaint  Patient presents with  . Rectal Bleeding  . Patient offers no new complaint.   Medications . calcium acetate  667 mg Oral TID WC  . pantoprazole (PROTONIX) IV  40 mg Intravenous Q12H  . sodium bicarbonate  650 mg Oral TID         CZY:SAYTK from the symptoms mentioned above,there are no other symptoms referable to all systems reviewed.  Physical Exam: Vital signs in last 24 hours: Temp:  [97.8 F (36.6 C)-98.7 F (37.1 C)] 98.4 F (36.9 C) (10/24 0844) Pulse Rate:  [73-88] 81 (10/24 0828) Resp:  [16-23] 21 (10/24 0828) BP: (95-125)/(54-76) 106/54 (10/24 0828) SpO2:  [95 %-100 %] 96 % (10/24 0828) Weight change:     Intake/Output from previous day: 10/23 0701 - 10/24 0700 In: 1520 [I.V.:550; Blood:720; IV Piggyback:250] Out: 2300 [Urine:2300] No intake/output data recorded.   Physical Exam:  General- pt is awake,alert, oriented to time place and person  Resp- No acute REsp distress, CTA B/L NO Rhonchi Nephrostomy tube in situ  CVS- S1S2 regular in rate and rhythm  GIT- BS+, soft, Non tender , Non distended  EXT- NO LE Edema, Cyanosis   Lab Results:  CBC  Recent Labs    05/24/20 0233 05/25/20 0422  WBC 9.3 9.7  HGB 6.4* 9.1*  HCT 19.4* 27.5*  PLT 392 354    BMET  Recent Labs    05/24/20 0343 05/25/20 0500  NA 137 139  K 4.1 3.6  CL 108 109  CO2 18* 18*  GLUCOSE 115* 93  BUN 84* 68*  CREATININE 9.89* 7.27*  CALCIUM 7.8* 7.8*        MICRO   Recent Results (from the past 240 hour(s))  Respiratory Panel by RT PCR (Flu A&B, Covid) - Nasopharyngeal Swab     Status: None   Collection Time: 05/22/20  7:34 PM   Specimen: Nasopharyngeal Swab  Result  Value Ref Range Status   SARS Coronavirus 2 by RT PCR NEGATIVE NEGATIVE Final    Comment: (NOTE) SARS-CoV-2 target nucleic acids are NOT DETECTED.  The SARS-CoV-2 RNA is generally detectable in upper respiratoy specimens during the acute phase of infection. The lowest concentration of SARS-CoV-2 viral copies this assay can detect is 131 copies/mL. A negative result does not preclude SARS-Cov-2 infection and should not be used as the sole basis for treatment or other patient management decisions. A negative result may occur with  improper specimen collection/handling, submission of specimen other than nasopharyngeal swab, presence of viral mutation(s) within the areas targeted by this assay, and inadequate number of viral copies (<131 copies/mL). A negative result must be combined with clinical observations, patient history, and epidemiological information. The expected result is Negative.  Fact Sheet for Patients:  PinkCheek.be  Fact Sheet for Healthcare Providers:  GravelBags.it  This test is no t yet approved or cleared by the [place] FDA and  has been authorized for detection and/or diagnosis of SARS-CoV-2 by FDA under an Emergency Use Authorization (EUA). This EUA will remain  in effect (meaning this test can be used) for the duration of the COVID-19 declaration under Section 564(b)(1) of the Act, 21 U.S.C. section 360bbb-3(b)(1), unless the authorization is terminated or revoked  sooner.     Influenza A by PCR NEGATIVE NEGATIVE Final   Influenza B by PCR NEGATIVE NEGATIVE Final    Comment: (NOTE) The Xpert Xpress SARS-CoV-2/FLU/RSV assay is intended as an aid in  the diagnosis of influenza from Nasopharyngeal swab specimens and  should not be used as a sole basis for treatment. Nasal washings and  aspirates are unacceptable for Xpert Xpress SARS-CoV-2/FLU/RSV  testing.  Fact Sheet for  Patients: PinkCheek.be  Fact Sheet for Healthcare Providers: GravelBags.it  This test is not yet approved or cleared by the [place] FDA and  has been authorized for detection and/or diagnosis of SARS-CoV-2 by  FDA under an Emergency Use Authorization (EUA). This EUA will remain  in effect (meaning this test can be used) for the duration of the  Covid-19 declaration under Section 564(b)(1) of the Act, 21  U.S.C. section 360bbb-3(b)(1), unless the authorization is  terminated or revoked. Performed at [facility], [address] [place], [place] [address]   Gastrointestinal Panel by PCR , Stool     Status: None   Collection Time: 05/24/20  1:00 AM   Specimen: Stool  Result Value Ref Range Status   Campylobacter species NOT DETECTED NOT DETECTED Final   Plesimonas shigelloides NOT DETECTED NOT DETECTED Final   Salmonella species NOT DETECTED NOT DETECTED Final   Yersinia enterocolitica NOT DETECTED NOT DETECTED Final   Vibrio species NOT DETECTED NOT DETECTED Final   Vibrio cholerae NOT DETECTED NOT DETECTED Final   Enteroaggregative E coli (EAEC) NOT DETECTED NOT DETECTED Final   Enteropathogenic E coli (EPEC) NOT DETECTED NOT DETECTED Final   Enterotoxigenic E coli (ETEC) NOT DETECTED NOT DETECTED Final   Shiga like toxin producing E coli (STEC) NOT DETECTED NOT DETECTED Final   Shigella/Enteroinvasive E coli (EIEC) NOT DETECTED NOT DETECTED Final   Cryptosporidium NOT DETECTED NOT DETECTED Final   Cyclospora cayetanensis NOT DETECTED NOT DETECTED Final   Entamoeba histolytica NOT DETECTED NOT DETECTED Final   Giardia lamblia NOT DETECTED NOT DETECTED Final   Adenovirus F40/41 NOT DETECTED NOT DETECTED Final   Astrovirus NOT DETECTED NOT DETECTED Final   Norovirus GI/GII NOT DETECTED NOT DETECTED Final   Rotavirus A NOT DETECTED NOT DETECTED Final   Sapovirus (I, II, IV, and V) NOT DETECTED NOT DETECTED  Final    Comment: Performed at [facility], [address] [place], [place] [address]  C Difficile Quick Screen w PCR reflex     Status: None   Collection Time: 05/24/20  1:00 AM   Specimen: Stool  Result Value Ref Range Status   C Diff antigen NEGATIVE NEGATIVE Final   C Diff toxin NEGATIVE NEGATIVE Final   C Diff interpretation No C. difficile detected.  Final    Comment: Performed at [facility], [address] [place], [place] [address]         Impression:   1)Renal     AKI secondary to obstruction Pt is now s/p Bilateral nephrostomy tube placement  . Patient creatinine is trending down  Most recent Creatinine trend  Lab Results  Component Value Date   CREATININE 7.27 (H) 05/25/2020   CREATININE 9.89 (H) 05/24/2020   CREATININE 10.74 (H) 05/23/2020    No uremic signs or symptoms at this time No need for renal replacement therapy today   2) rectal bleeding GI following  3)Anemia of chronic disease  CBC Latest Ref Rng & Units 05/25/2020 05/24/2020 05/23/2020  WBC 4.0 - 10.5  K/uL 9.7 9.3 9.8  Hemoglobin 12.0 - 15.0 g/dL 9.1(L) 6.4(L) 6.9(L)  Hematocrit 36 - 46 % 27.5(L) 19.4(L) 21.4(L)  Platelets 150 - 400 K/uL 354 392 425(H)     Patient did receive PRBC yesterday  HGb is now at goal (9--11)   4) Secondary hyperparathyroidism -CKD Mineral-Bone Disorder    Lab Results  Component Value Date   CALCIUM 7.8 (L) 05/25/2020   PHOS 5.5 (H) 05/25/2020    Secondary Hyperparathyroidism present Phosphorus is not at goal. On binders-phosphorus    5) Electrolytes   BMP Latest Ref Rng & Units 05/25/2020 05/24/2020 05/23/2020  Glucose 70 - 99 mg/dL 93 115(H) 100(H)  BUN 6 - 20 mg/dL 68(H) 84(H) 87(H)  Creatinine 0.44 - 1.00 mg/dL 7.27(H) 9.89(H) 10.74(H)  Sodium 135 - 145 mmol/L 139 137 138  Potassium 3.5 - 5.1 mmol/L 3.6 4.1 4.0  Chloride 98 - 111 mmol/L 109 108 107  CO2 22 - 32 mmol/L 18(L) 18(L) 16(L)  Calcium 8.9 - 10.3 mg/dL  7.8(L) 7.8(L) 8.3(L)     Sodium Normonatremic   Potassium Normokalemic    6)Acid base Non-aniongap acidosis Co2 is not at goal  Patient is on bicarb   Plan:   We will continue current treatment plan     Autumn Pruitt s [facility] 05/25/2020, 10:24 AM

## 2020-05-25 NOTE — ED Notes (Signed)
Urine output: L urostomy 81mL yellow clear, R urostomy 69mL yellow with red tinge

## 2020-05-25 NOTE — ED Notes (Signed)
572ml of urine drained from left urostomy

## 2020-05-25 NOTE — Progress Notes (Signed)
PROGRESS NOTE    Anita Sullivan  SWN:[id number] DOB: April 13, 1966 DOA: 05/22/2020 PCP: Volney American, PA-C   Brief Narrative:  Anita Sullivan is a 54 y.o. female admitted last night with rectal bleeding.  Ongoing for 3 months on and off.The emergency room her hemoglobin was noted to be 6.4 g with an MCV of 75.8. Creatinine of 9.82 and CO2 of 16. Large amount of blood and leukocytes in the urine. CT scan of the abdomen and pelvis that demonstrated marked severe sigmoid colitis numerous, numerous areas of soft tissue nodularity within the mesentery suggestive of an underlying neoplastic process that cannot be excluded heterogeneous liver masses noted suspicious for a neoplasm.  Moderately severe Faera aortic and aortocaval lymphadenopathy noted marked severe bilateral hydronephrosis and hydroureter representing partial renal obstruction. High concern for extensive malignancy. Patient has quite a bit of unintentional weight loss recently. No family history of malignancy. Multiple specialities were consulted this morning which include GI, urology, nephrology and oncology. She underwent emergent bilateral nephrostomy tube placement by IR and draining well now.   Subjective: Patient was feeling better when seen today.  Has no new complaint.  Eating her breakfast.  Waiting for liver biopsy and colonoscopy.  Did not had any bowel movement since this morning.  Assessment & Plan:   Principal Problem:   GI bleed Active Problems:   Acute renal failure (HCC)   Obstructive uropathy   Colitis, acute   Bleeding per rectum   Rectal bleed  Lower GI bleed. Symptoms and imaging is concerning for extensive malignancy. Patient had unintentional weight loss.  Patient received total of 3 units of PRBC.  Also received IV iron.  Hemoglobin improved to 9.1 today. Oncology is following-appreciate their recommendations.  Mildly elevated CEA with normal other tumor markers.  Anemia panel with anemia  of chronic disease, no significant iron deficiency. Going for colonoscopy on Tuesday. Going for liver biopsy on Monday. -Monitor CBC. -Check anemia panel. -Transfuse if below 8.  Acute kidney injury. Most likely secondary to obstructive uropathy as evident on CT abdomen. Urology was also consulted and according to their advice IR was consulted for bilateral emergent nephrostomy tube placement which was done yesterday. Patient draining well from nephrostomy tubes, both bags with hematuria today. Urology signed off today-will need replacement of nephrostomy tubes in 4 to 6 weeks by IR. Nephrology consult was also placed-will appreciate their recommendations. Creatinine started trending down, it was 7.27 with BUN of 68 today.  Bicarb at 18.  Phosphorus at 5.5. -Encourage p.o. hydration. -Start her on oral bicarb. -She was started on calcium binders by nephrology yesterday. -Monitor renal function. -Avoid nephrotoxins.  Anion gap metabolic acidosis.  Anion gap resolved.  Lactic acid within normal limit. Most likely secondary to severe acute AKI and GI bleed.  Bicarb remained stable at 18. -Adding p.o. bicarb.  -Continue to monitor.  Objective: Vitals:   05/25/20 0628 05/25/20 0643 05/25/20 0828 05/25/20 0844  BP: (!) 100/55  (!) 106/54   Pulse: 74 75 81   Resp: 18 (!) 22 (!) 21   Temp:    98.4 F (36.9 C)  TempSrc:    Oral  SpO2: 97% 97% 96%   Weight:      Height:        Intake/Output Summary (Last 24 hours) at 05/25/2020 1247 Last data filed at 05/25/2020 0525 Gross per 24 hour  Intake 1150 ml  Output 2300 ml  Net -1150 ml   Filed Weights   05/22/20  1537  Weight: 59 kg    Examination:  General.  Well-developed lady, in no acute distress.  Bilateral nephrostomy tube in place. Pulmonary.  Lungs clear bilaterally, normal respiratory effort. CV.  Regular rate and rhythm, no JVD, rub or murmur. Abdomen.  Soft, nontender, nondistended, BS positive. CNS.  Alert and oriented  x3.  No focal neurologic deficit. Extremities.  No edema, no cyanosis, pulses intact and symmetrical. Psychiatry.  Judgment and insight appears normal.  DVT prophylaxis: SCDs Code Status: Full Family Communication: Daughter was updated on phone. Disposition Plan:  Status is: Inpatient  Remains inpatient appropriate because:Inpatient level of care appropriate due to severity of illness   Dispo: The patient is from: Home              Anticipated d/c is to: Home              Anticipated d/c date is: 3 days              Patient currently is not medically stable to d/c.   Consultants:   GI  IR  Oncology  Nephrology  Procedures:  Antimicrobials:   Data Reviewed: I have personally reviewed following labs and imaging studies  CBC: Recent Labs  Lab 05/22/20 1542 05/23/20 1542 05/24/20 0233 05/25/20 0422  WBC 11.4* 9.8 9.3 9.7  HGB 6.4* 6.9* 6.4* 9.1*  HCT 20.0* 21.4* 19.4* 27.5*  MCV 75.8* 77.0* 76.4* 79.9*  PLT 456* 425* 392 270   Basic Metabolic Panel: Recent Labs  Lab 05/22/20 1542 05/23/20 1542 05/24/20 0343 05/25/20 0500  NA 135 138 137 139  K 4.1 4.0 4.1 3.6  CL 103 107 108 109  CO2 16* 16* 18* 18*  GLUCOSE 108* 100* 115* 93  BUN 83* 87* 84* 68*  CREATININE 9.82* 10.74* 9.89* 7.27*  CALCIUM 8.1* 8.3* 7.8* 7.8*  PHOS  --   --  6.8* 5.5*   GFR: Estimated Creatinine Clearance: 7.3 mL/min (A) (by C-G formula based on SCr of 7.27 mg/dL (H)). Liver Function Tests: Recent Labs  Lab 05/22/20 1542 05/24/20 0343 05/25/20 0500  AST 14*  --   --   ALT 9  --   --   ALKPHOS 62  --   --   BILITOT 0.5  --   --   PROT 7.7  --   --   ALBUMIN 3.0* 2.5* 2.4*   Recent Labs  Lab 05/22/20 1934  LIPASE 27   No results for input(s): AMMONIA in the last 168 hours. Coagulation Profile: Recent Labs  Lab 05/22/20 2313  INR 1.2   Cardiac Enzymes: No results for input(s): CKTOTAL, CKMB, CKMBINDEX, TROPONINI in the last 168 hours. BNP (last 3 results) No  results for input(s): PROBNP in the last 8760 hours. HbA1C: No results for input(s): HGBA1C in the last 72 hours. CBG: No results for input(s): GLUCAP in the last 168 hours. Lipid Profile: No results for input(s): CHOL, HDL, LDLCALC, TRIG, CHOLHDL, LDLDIRECT in the last 72 hours. Thyroid Function Tests: No results for input(s): TSH, T4TOTAL, FREET4, T3FREE, THYROIDAB in the last 72 hours. Anemia Panel: Recent Labs    05/23/20 1542  VITAMINB12 529  FOLATE 7.9  FERRITIN 58  TIBC 223*  IRON 45  RETICCTPCT 0.9   Sepsis Labs: Recent Labs  Lab 05/22/20 1934 05/22/20 2312  LATICACIDVEN 1.0 0.6    Recent Results (from the past 240 hour(s))  Respiratory Panel by RT PCR (Flu A&B, Covid) - Nasopharyngeal Swab  Status: None   Collection Time: 05/22/20  7:34 PM   Specimen: Nasopharyngeal Swab  Result Value Ref Range Status   SARS Coronavirus 2 by RT PCR NEGATIVE NEGATIVE Final    Comment: (NOTE) SARS-CoV-2 target nucleic acids are NOT DETECTED.  The SARS-CoV-2 RNA is generally detectable in upper respiratoy specimens during the acute phase of infection. The lowest concentration of SARS-CoV-2 viral copies this assay can detect is 131 copies/mL. A negative result does not preclude SARS-Cov-2 infection and should not be used as the sole basis for treatment or other patient management decisions. A negative result may occur with  improper specimen collection/handling, submission of specimen other than nasopharyngeal swab, presence of viral mutation(s) within the areas targeted by this assay, and inadequate number of viral copies (<131 copies/mL). A negative result must be combined with clinical observations, patient history, and epidemiological information. The expected result is Negative.  Fact Sheet for Patients:  PinkCheek.be  Fact Sheet for Healthcare Providers:  GravelBags.it  This test is no t yet approved or  cleared by the [place] FDA and  has been authorized for detection and/or diagnosis of SARS-CoV-2 by FDA under an Emergency Use Authorization (EUA). This EUA will remain  in effect (meaning this test can be used) for the duration of the COVID-19 declaration under Section 564(b)(1) of the Act, 21 U.S.C. section 360bbb-3(b)(1), unless the authorization is terminated or revoked sooner.     Influenza A by PCR NEGATIVE NEGATIVE Final   Influenza B by PCR NEGATIVE NEGATIVE Final    Comment: (NOTE) The Xpert Xpress SARS-CoV-2/FLU/RSV assay is intended as an aid in  the diagnosis of influenza from Nasopharyngeal swab specimens and  should not be used as a sole basis for treatment. Nasal washings and  aspirates are unacceptable for Xpert Xpress SARS-CoV-2/FLU/RSV  testing.  Fact Sheet for Patients: PinkCheek.be  Fact Sheet for Healthcare Providers: GravelBags.it  This test is not yet approved or cleared by the [place] FDA and  has been authorized for detection and/or diagnosis of SARS-CoV-2 by  FDA under an Emergency Use Authorization (EUA). This EUA will remain  in effect (meaning this test can be used) for the duration of the  Covid-19 declaration under Section 564(b)(1) of the Act, 21  U.S.C. section 360bbb-3(b)(1), unless the authorization is  terminated or revoked. Performed at [facility], [address] [place], [place] [address]   Gastrointestinal Panel by PCR , Stool     Status: None   Collection Time: 05/24/20  1:00 AM   Specimen: Stool  Result Value Ref Range Status   Campylobacter species NOT DETECTED NOT DETECTED Final   Plesimonas shigelloides NOT DETECTED NOT DETECTED Final   Salmonella species NOT DETECTED NOT DETECTED Final   Yersinia enterocolitica NOT DETECTED NOT DETECTED Final   Vibrio species NOT DETECTED NOT DETECTED Final   Vibrio cholerae NOT DETECTED NOT DETECTED Final    Enteroaggregative E coli (EAEC) NOT DETECTED NOT DETECTED Final   Enteropathogenic E coli (EPEC) NOT DETECTED NOT DETECTED Final   Enterotoxigenic E coli (ETEC) NOT DETECTED NOT DETECTED Final   Shiga like toxin producing E coli (STEC) NOT DETECTED NOT DETECTED Final   Shigella/Enteroinvasive E coli (EIEC) NOT DETECTED NOT DETECTED Final   Cryptosporidium NOT DETECTED NOT DETECTED Final   Cyclospora cayetanensis NOT DETECTED NOT DETECTED Final   Entamoeba histolytica NOT DETECTED NOT DETECTED Final   Giardia lamblia NOT DETECTED NOT DETECTED Final   Adenovirus F40/41 NOT DETECTED NOT DETECTED Final  Astrovirus NOT DETECTED NOT DETECTED Final   Norovirus GI/GII NOT DETECTED NOT DETECTED Final   Rotavirus A NOT DETECTED NOT DETECTED Final   Sapovirus (I, II, IV, and V) NOT DETECTED NOT DETECTED Final    Comment: Performed at [facility], [place], [place] [address]  C Difficile Quick Screen w PCR reflex     Status: None   Collection Time: 05/24/20  1:00 AM   Specimen: Stool  Result Value Ref Range Status   C Diff antigen NEGATIVE NEGATIVE Final   C Diff toxin NEGATIVE NEGATIVE Final   C Diff interpretation No C. difficile detected.  Final    Comment: Performed at [facility], [address], [place], [place] [address]     Radiology Studies: No results found.  Scheduled Meds: . calcium acetate  667 mg Oral TID WC  . pantoprazole (PROTONIX) IV  40 mg Intravenous Q12H  . sodium bicarbonate  650 mg Oral TID   Continuous Infusions: . lactated ringers Stopped (05/25/20 0952)     LOS: 3 days   Time spent: 30 minutes. More than 50% of the time was spent with direct patient care and coordinating with other specialities.  Lorella Nimrod, MD [facility]  If 7PM-7AM, please contact night-coverage [email or web address]  05/25/2020, 12:47 PM   This record has been created using Systems analyst. Errors have been sought and  corrected,but may not always be located. Such creation errors do not reflect on the standard of care.

## 2020-05-25 NOTE — ED Notes (Signed)
Pt noted to be sleeping. Pt does not appear to be in any apparent distress or discomfort. Pt is on cardiac, O2, and BP monitors.

## 2020-05-25 NOTE — ED Notes (Signed)
Provider made aware of last documented hemaglobin and hematocrite. Provider changed orders. Please see for more information.

## 2020-05-25 NOTE — ED Notes (Signed)
Pt states coming in for rectal bleeding for a while, but increased over the last several days.

## 2020-05-25 NOTE — ED Notes (Signed)
Called report to [facility]. Pt will move up shortly.

## 2020-05-26 ENCOUNTER — Inpatient Hospital Stay: Payer: Medicaid Other

## 2020-05-26 LAB — PROTIME-INR
INR: 1.1 (ref 0.8–1.2)
Prothrombin Time: 13.5 seconds (ref 11.4–15.2)

## 2020-05-26 LAB — MAGNESIUM: Magnesium: 1.9 mg/dL (ref 1.7–2.4)

## 2020-05-26 LAB — RENAL FUNCTION PANEL
Albumin: 2.5 g/dL — ABNORMAL LOW (ref 3.5–5.0)
Anion gap: 11 (ref 5–15)
BUN: 46 mg/dL — ABNORMAL HIGH (ref 6–20)
CO2: 25 mmol/L (ref 22–32)
Calcium: 8.5 mg/dL — ABNORMAL LOW (ref 8.9–10.3)
Chloride: 108 mmol/L (ref 98–111)
Creatinine, Ser: 4.83 mg/dL — ABNORMAL HIGH (ref 0.44–1.00)
GFR, Estimated: 10 mL/min — ABNORMAL LOW (ref >60)
Glucose, Bld: 100 mg/dL — ABNORMAL HIGH (ref 70–99)
Phosphorus: 4.1 mg/dL (ref 2.5–4.6)
Potassium: 3.3 mmol/L — ABNORMAL LOW (ref 3.5–5.1)
Sodium: 144 mmol/L (ref 135–145)

## 2020-05-26 LAB — CBC
HCT: 27.9 % — ABNORMAL LOW (ref 36.0–46.0)
Hemoglobin: 9.2 g/dL — ABNORMAL LOW (ref 12.0–15.0)
MCH: 26.1 pg (ref 26.0–34.0)
MCHC: 33 g/dL (ref 30.0–36.0)
MCV: 79 fL — ABNORMAL LOW (ref 80.0–100.0)
Platelets: 393 10*3/uL (ref 150–400)
RBC: 3.53 MIL/uL — ABNORMAL LOW (ref 3.87–5.11)
RDW: 16.9 % — ABNORMAL HIGH (ref 11.5–15.5)
WBC: 9.3 10*3/uL (ref 4.0–10.5)
nRBC: 0 % (ref 0.0–0.2)

## 2020-05-26 LAB — URINE CULTURE: Culture: NO GROWTH

## 2020-05-26 MED ORDER — MAGNESIUM CITRATE PO SOLN
1.0000 | Freq: Once | ORAL | Status: AC
  Administered 2020-05-26: 1 via ORAL
  Filled 2020-05-26: qty 296

## 2020-05-26 MED ORDER — FENTANYL CITRATE (PF) 100 MCG/2ML IJ SOLN
INTRAMUSCULAR | Status: AC | PRN
Start: 2020-05-26 — End: 2020-05-26
  Administered 2020-05-26: 50 ug via INTRAVENOUS

## 2020-05-26 MED ORDER — MIDAZOLAM HCL 5 MG/5ML IJ SOLN
INTRAMUSCULAR | Status: AC | PRN
  Administered 2020-05-26: 1 mg via INTRAVENOUS

## 2020-05-26 MED ORDER — POTASSIUM CHLORIDE CRYS ER 20 MEQ PO TBCR: 20.0000 meq | EXTENDED_RELEASE_TABLET | Freq: Once | ORAL | Status: DC

## 2020-05-26 MED ORDER — SODIUM CHLORIDE 0.9 % IV SOLN: INTRAVENOUS | Status: DC

## 2020-05-26 MED ORDER — MIDAZOLAM HCL 5 MG/5ML IJ SOLN
INTRAMUSCULAR | Status: AC
  Filled 2020-05-26: qty 5

## 2020-05-26 MED ORDER — FENTANYL CITRATE (PF) 100 MCG/2ML IJ SOLN
INTRAMUSCULAR | Status: AC
  Filled 2020-05-26: qty 2

## 2020-05-26 MED ORDER — POLYETHYLENE GLYCOL 3350 17 GM/SCOOP PO POWD
1.0000 | Freq: Once | ORAL | Status: AC
  Administered 2020-05-26: 255 g via ORAL
  Filled 2020-05-26: qty 255

## 2020-05-26 NOTE — Consult Note (Signed)
Chief Complaint: Patient was seen in consultation today for a liver lesion biopsy.  Referring Physician(s): Lorella Nimrod  Supervising Physician: Aletta Edouard  Patient Status: [place] - In-pt  History of Present Illness: Anita Sullivan is a 54 y.o. female with no significant past medical history prior to admission who presented to the ED on 05/22/20 with complaints of intermittent rectal bleeding x 3 months. On presentation she reported several large bloody bowel movements that day which prompted her to come in. Initial evaluation showed hgb 6.0 with markedly elevated BUN/creatinine as well as imaging showing possible colonic mass, liver lesions, lymphadenopathy and bilateral hydronephrosis. She was admitted and underwent bilateral PCN placement on 10/22 in IR. We have been asked to perform a liver lesion biopsy today to further direct care. Of note, patient is also planned for a colonoscopy tomorrow.  Anita Sullivan denies any complaints except that she's hungry and thirsty, she's looking forward to having something to drink after the procedure today. No concerns with her PCNs. She understands the procedure today and is agreeable to proceed as planned.  History reviewed. No pertinent past medical history.  Past Surgical History:  Procedure Laterality Date  . IR NEPHROSTOMY PLACEMENT LEFT  05/23/2020  . IR NEPHROSTOMY PLACEMENT RIGHT  05/23/2020    Allergies: Patient has no known allergies.  Medications: Prior to Admission medications   Medication Sig Start Date End Date Taking? Authorizing Provider  acetaminophen (TYLENOL) 500 MG tablet Take 1,000 mg by mouth every 6 (six) hours as needed. Patient not taking: Reported on 05/22/2020    [provider]     Family History  Problem Relation Age of Onset  . Other Mother        Corticobasal degeneration  . Cancer Mother        Uterine  . Diabetes Maternal Grandmother   . Stroke Maternal Grandfather   . Stroke  Paternal Grandfather   . Heart disease Father     Social History   Socioeconomic History  . Marital status: Divorced    Spouse name: Not on file  . Number of children: Not on file  . Years of education: Not on file  . Highest education level: Not on file  Occupational History  . Not on file  Tobacco Use  . Smoking status: Never Smoker  . Smokeless tobacco: Never Used  Substance and Sexual Activity  . Alcohol use: Never  . Drug use: Never  . Sexual activity: Not Currently  Other Topics Concern  . Not on file  Social History Narrative  . Not on file   Social Determinants of Health   Financial Resource Strain:   . Difficulty of Paying Living Expenses: Not on file  Food Insecurity:   . Worried About Charity fundraiser in the Last Year: Not on file  . Ran Out of Food in the Last Year: Not on file  Transportation Needs:   . Lack of Transportation (Medical): Not on file  . Lack of Transportation (Non-Medical): Not on file  Physical Activity:   . Days of Exercise per Week: Not on file  . Minutes of Exercise per Session: Not on file  Stress:   . Feeling of Stress : Not on file  Social Connections:   . Frequency of Communication with Friends and Family: Not on file  . Frequency of Social Gatherings with Friends and Family: Not on file  . Attends Religious Services: Not on file  . Active Member of Clubs or Organizations:  Not on file  . Attends Archivist Meetings: Not on file  . Marital Status: Not on file     Review of Systems: A 12 point ROS discussed and pertinent positives are indicated in the HPI above.  All other systems are negative.  Review of Systems  Constitutional: Negative for chills and fever.  Respiratory: Negative for cough and shortness of breath.   Cardiovascular: Negative for chest pain.  Gastrointestinal: Negative for abdominal pain, nausea and vomiting.  Musculoskeletal: Negative for back pain.  Neurological: Negative for dizziness and  headaches.    Vital Signs: BP 120/76 (BP Location: Right Arm)   Pulse 89   Temp 97.7 F (36.5 C) (Oral)   Resp 19   Ht 5' (1.524 m)   Wt 130 lb (59 kg)   SpO2 99%   BMI 25.39 kg/m   Physical Exam Vitals and nursing note reviewed.  Constitutional:      General: She is not in acute distress. HENT:     Head: Normocephalic.     Mouth/Throat:     Mouth: Mucous membranes are moist.     Pharynx: Oropharynx is clear. No oropharyngeal exudate or posterior oropharyngeal erythema.  Cardiovascular:     Rate and Rhythm: Normal rate and regular rhythm.  Pulmonary:     Effort: Pulmonary effort is normal.     Breath sounds: Normal breath sounds.  Abdominal:     General: There is no distension.     Palpations: Abdomen is soft.     Tenderness: There is no abdominal tenderness.  Genitourinary:    Comments: (+) Bilateral PCNs draining hazy yellow urine. Skin:    General: Skin is warm and dry.  Neurological:     Mental Status: She is alert and oriented to person, place, and time.  Psychiatric:        Mood and Affect: Mood normal.        Behavior: Behavior normal.        Thought Content: Thought content normal.        Judgment: Judgment normal.      MD Evaluation Airway: WNL Heart: WNL Abdomen: WNL Chest/ Lungs: WNL ASA  Classification: 3 Mallampati/Airway Score: Two   Imaging: CT ABDOMEN PELVIS WO CONTRAST  Result Date: 05/22/2020 CLINICAL DATA:  Intermittent rectal bleeding. EXAM: CT ABDOMEN AND PELVIS WITHOUT CONTRAST TECHNIQUE: Multidetector CT imaging of the abdomen and pelvis was performed following the standard protocol without IV contrast. COMPARISON:  None. FINDINGS: Lower chest: No acute abnormality. Hepatobiliary: A 2.9 cm x 3.1 cm ill-defined area of heterogeneous low attenuation is seen within the lateral aspect of the liver dome (axial CT image 17, CT series number 2). An ill-defined, slightly exophytic 6.9 cm x 5.0 cm area of heterogeneous mildly decreased  attenuation is seen within the posterior aspect of the right lobe of the liver. An ill-defined central area of low attenuation is noted within this region. No gallstones, gallbladder wall thickening, or biliary dilatation. Pancreas: Unremarkable. No pancreatic ductal dilatation or surrounding inflammatory changes. Spleen: Normal in size without focal abnormality. Adrenals/Urinary Tract: Adrenal glands are unremarkable. Kidneys are normal in size, without focal lesions. Marked severity bilateral hydronephrosis and hydroureter is seen. The urinary bladder is partially contracted and subsequently limited in evaluation. Stomach/Bowel: Stomach is within normal limits. Appendix appears normal. No evidence of bowel dilatation. There is marked severity thickening of the mid and distal sigmoid colon associated pericolonic inflammatory fat stranding is noted. Vascular/Lymphatic: No significant vascular findings are present.  There is moderate severity para-aortic and aortocaval lymphadenopathy. The largest lymph node measures approximately 1.7 cm x 1.7 cm along the para-aortic region at the level of the kidney. It should be noted that this area is limited in evaluation in the absence of intravenous contrast. A 2.8 cm x 2.4 cm heterogeneous soft tissue mass is seen along the posterior aspect of the distal left iliac vessels (axial CT images 68 through 74, CT series number 2). Numerous subcentimeter soft tissue nodules are seen within the mesentery of the pelvis, along the midline (axial CT images 58 through 66, CT series number 2). Numerous additional small soft tissue nodules are seen surrounding the distal sigmoid colon and rectosigmoid junction. Reproductive: The uterus is mildly enlarged. The uterine fundus is positioned to the left of midline within the anterior aspect of the pelvis. Other: No abdominal wall hernia or abnormality. A small amount of abdominal and pelvic fluid is noted. Musculoskeletal: No acute or  significant osseous findings. IMPRESSION: 1. Marked severity sigmoid colitis. Due to the numerous areas of soft tissue nodularity within the mesentery in this region, an underlying neoplastic process cannot be excluded. Correlation with colonoscopy is recommended. 2. Heterogeneous liver masses, as described above, suspicious for an underlying neoplasm. MRI correlation is recommended. 3. Moderate severity para-aortic and aortocaval lymphadenopathy with an additional enlarged, irregular appearing lymph node seen along the posterior aspect of the distal left iliac vessels. This is also worrisome for sequelae associated with an underlying neoplasm. 4. Marked severity bilateral hydronephrosis and hydroureter which likely represents partial renal obstruction secondary to the inflammatory and suspected neoplastic process seen within the pelvis. Electronically Signed   By: Virgina Norfolk M.D.   On: 05/22/2020 20:26   [place] Intraoperative  Result Date: 05/23/2020 INDICATION: Patient admitted with new diagnosis of malignancy of unknown etiology now with bilateral obstructive hydronephrosis. Request made for placement of bilateral percutaneous nephrostomy catheters for urinary diversion purposes. EXAM: 1. ULTRASOUND GUIDANCE FOR PUNCTURE OF THE BILATERAL RENAL COLLECTING SYSTEMS 2. BILATERAL PERCUTANEOUS NEPHROSTOMY TUBE PLACEMENT. COMPARISON:  CT abdomen and pelvis-05/22/2020 MEDICATIONS: Ancef 2 gm IV; The antibiotic was administered in an appropriate time frame prior to skin puncture. ANESTHESIA/SEDATION: Moderate (conscious) sedation was employed during this procedure. A total of Versed 4 mg and Fentanyl 100 mcg was administered intravenously. Moderate Sedation Time: 22 minutes. The patient's level of consciousness and vital signs were monitored continuously by radiology nursing throughout the procedure under my direct supervision. CONTRAST:  A total of 20 cc Visipaque 320 was administered into both renal collecting  systems. FLUOROSCOPY TIME:  1 minute, 42 seconds (19.1 mGy) COMPLICATIONS: None immediate. PROCEDURE: The procedure, risks, benefits, and alternatives were explained to the patient. Questions regarding the procedure were encouraged and answered. The patient understands and consents to the procedure. A timeout was performed prior to the initiation of the procedure. The bilateral flanks were prepped and draped in the usual sterile fashion and a sterile drape was applied covering the operative field. A sterile gown and sterile gloves were used for the procedure. Local anesthesia was provided with 1% Lidocaine with epinephrine. Attention was first paid towards placement of the left-sided percutaneous nephrostomy catheter. Ultrasound was used to localize the left kidney. Under direct ultrasound guidance, a 20 gauge needle was advanced into the renal collecting system. An ultrasound image documentation was performed. Access within the collecting system was confirmed with the efflux of urine followed by limited contrast injection. Over a Nitrex wire, the tract was dilated with an Accustick stent.  Next, under intermittent fluoroscopic guidance and over a short Amplatz wire, the track was dilated ultimately allowing placement of a 10-French percutaneous nephrostomy catheter which was advanced to the level of the renal pelvis where the coil was formed and locked. Contrast was injected and several spot fluoroscopic images were obtained in various obliquities. The catheter was secured at the skin with a Prolene retention suture and stat lock device and connected to a gravity bag was placed. The identical procedure was repeated for the contralateral right kidney allowing placement of a 10 French percutaneous nephrostomy catheter with end ultimately coiled and locked within the right renal pelvis. Dressings were applied. The patient tolerated procedure well without immediate postprocedural complication. FINDINGS: Ultrasound  scanning demonstrates a moderate to severely dilatation of the bilateral renal collecting systems, right greater than left, as demonstrated on preceding abdominal CT. Under a combination of ultrasound and fluoroscopic guidance, a posterior inferior calix was targeted bilaterally allowing placement of bilateral 10 French percutaneous nephrostomy catheters with ends coiled and locked within the bilateral renal pelvises. Contrast injection confirmed appropriate positioning. IMPRESSION: Successful ultrasound and fluoroscopic guided placement of bilateral 10 French PCNs. Electronically Signed   By: Sandi Mariscal M.D.   On: 05/23/2020 12:55   US RENAL  Result Date: 05/23/2020 CLINICAL DATA:  Acute kidney injury EXAM: RENAL / URINARY TRACT ULTRASOUND COMPLETE COMPARISON:  CT 05/22/2020 FINDINGS: Right Kidney: Renal measurements: 11.5 x 5.2 x 5.5 cm = volume: 170 mL. Mild renal cortical thinning with increased cortical echogenicity. Severe right hydronephrosis. The visualized right ureter is moderately dilated. No discrete shadowing stone or mass lesion identified. Left Kidney: Renal measurements: 12.5 x 5.2 x 4.7 cm = volume: 162 mL. Cortical thickness within normal limits. Increased renal cortical echogenicity. Moderate to severe hydronephrosis. Visualized left ureter is mildly dilated. No discrete shadowing stone or mass lesion identified. Bladder: Partially distended.  No definite abnormality. Other: Inferior right lower lobe solid intrahepatic mass measuring up to 6.0 cm. Irregular and heterogeneous appearance of the uterus is partially visualized on images within the pelvis, incompletely characterized. IMPRESSION: 1. Severe right hydroureteronephrosis. 2. Moderate to severe left hydronephrosis. 3. Increased renal cortical echogenicity bilaterally suggesting medical renal disease. 4. Very irregular appearance of visualized portion of the uterus noted on transabdominal imaging through the pelvis. Recommend dedicated  pelvic ultrasound for further evaluation. 5. Solid 6.0 cm inferior right hepatic lobe mass most suggestive of metastatic disease based on the previous CT. Electronically Signed   By: Davina Poke D.O.   On: 05/23/2020 10:04   IR NEPHROSTOMY PLACEMENT LEFT  Result Date: 05/23/2020 INDICATION: Patient admitted with new diagnosis of malignancy of unknown etiology now with bilateral obstructive hydronephrosis. Request made for placement of bilateral percutaneous nephrostomy catheters for urinary diversion purposes. EXAM: 1. ULTRASOUND GUIDANCE FOR PUNCTURE OF THE BILATERAL RENAL COLLECTING SYSTEMS 2. BILATERAL PERCUTANEOUS NEPHROSTOMY TUBE PLACEMENT. COMPARISON:  CT abdomen and pelvis-05/22/2020 MEDICATIONS: Ancef 2 gm IV; The antibiotic was administered in an appropriate time frame prior to skin puncture. ANESTHESIA/SEDATION: Moderate (conscious) sedation was employed during this procedure. A total of Versed 4 mg and Fentanyl 100 mcg was administered intravenously. Moderate Sedation Time: 22 minutes. The patient's level of consciousness and vital signs were monitored continuously by radiology nursing throughout the procedure under my direct supervision. CONTRAST:  A total of 20 cc Visipaque 320 was administered into both renal collecting systems. FLUOROSCOPY TIME:  1 minute, 42 seconds (35.4 mGy) COMPLICATIONS: None immediate. PROCEDURE: The procedure, risks, benefits, and alternatives were explained  to the patient. Questions regarding the procedure were encouraged and answered. The patient understands and consents to the procedure. A timeout was performed prior to the initiation of the procedure. The bilateral flanks were prepped and draped in the usual sterile fashion and a sterile drape was applied covering the operative field. A sterile gown and sterile gloves were used for the procedure. Local anesthesia was provided with 1% Lidocaine with epinephrine. Attention was first paid towards placement of the  left-sided percutaneous nephrostomy catheter. Ultrasound was used to localize the left kidney. Under direct ultrasound guidance, a 20 gauge needle was advanced into the renal collecting system. An ultrasound image documentation was performed. Access within the collecting system was confirmed with the efflux of urine followed by limited contrast injection. Over a Nitrex wire, the tract was dilated with an Accustick stent. Next, under intermittent fluoroscopic guidance and over a short Amplatz wire, the track was dilated ultimately allowing placement of a 10-French percutaneous nephrostomy catheter which was advanced to the level of the renal pelvis where the coil was formed and locked. Contrast was injected and several spot fluoroscopic images were obtained in various obliquities. The catheter was secured at the skin with a Prolene retention suture and stat lock device and connected to a gravity bag was placed. The identical procedure was repeated for the contralateral right kidney allowing placement of a 10 French percutaneous nephrostomy catheter with end ultimately coiled and locked within the right renal pelvis. Dressings were applied. The patient tolerated procedure well without immediate postprocedural complication. 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 Under a combination of ultrasound and fluoroscopic guidance, a posterior inferior calix was targeted bilaterally allowing placement of bilateral 10 French percutaneous nephrostomy catheters with ends coiled and locked within the bilateral renal pelvises. Contrast injection confirmed appropriate positioning. IMPRESSION: Successful ultrasound and fluoroscopic guided placement of bilateral 10 French PCNs. Electronically Signed   By: Sandi Mariscal M.D.   On: 05/23/2020 12:55   IR NEPHROSTOMY PLACEMENT RIGHT  Result  Date: 05/23/2020 INDICATION: Patient admitted with new diagnosis of malignancy of unknown etiology now with bilateral obstructive hydronephrosis. Request made for placement of bilateral percutaneous nephrostomy catheters for urinary diversion purposes. EXAM: 1. ULTRASOUND GUIDANCE FOR PUNCTURE OF THE BILATERAL RENAL COLLECTING SYSTEMS 2. BILATERAL PERCUTANEOUS NEPHROSTOMY TUBE PLACEMENT. COMPARISON:  CT abdomen and pelvis-05/22/2020 MEDICATIONS: Ancef 2 gm IV; The antibiotic was administered in an appropriate time frame prior to skin puncture. ANESTHESIA/SEDATION: Moderate (conscious) sedation was employed during this procedure. A total of Versed 4 mg and Fentanyl 100 mcg was administered intravenously. Moderate Sedation Time: 22 minutes. The patient's level of consciousness and vital signs were monitored continuously by radiology nursing throughout the procedure under my direct supervision. CONTRAST:  A total of 20 cc Visipaque 320 was administered into both renal collecting systems. FLUOROSCOPY TIME:  1 minute, 42 seconds (16.1 mGy) COMPLICATIONS: None immediate. PROCEDURE: The procedure, risks, benefits, and alternatives were explained to the patient. Questions regarding the procedure were encouraged and answered. The patient understands and consents to the procedure. A timeout was performed prior to the initiation of the procedure. The bilateral flanks were prepped and draped in the usual sterile fashion and a sterile drape was applied covering the operative field. A sterile gown and sterile gloves were used for the procedure. Local anesthesia was provided with 1% Lidocaine with epinephrine. Attention was first paid towards placement of the  left-sided percutaneous nephrostomy catheter. Ultrasound was used to localize the left kidney. Under direct ultrasound guidance, a 20 gauge needle was advanced into the renal collecting system. An ultrasound image documentation was performed. Access within the collecting  system was confirmed with the efflux of urine followed by limited contrast injection. Over a Nitrex wire, the tract was dilated with an Accustick stent. Next, under intermittent fluoroscopic guidance and over a short Amplatz wire, the track was dilated ultimately allowing placement of a 10-French percutaneous nephrostomy catheter which was advanced to the level of the renal pelvis where the coil was formed and locked. Contrast was injected and several spot fluoroscopic images were obtained in various obliquities. The catheter was secured at the skin with a Prolene retention suture and stat lock device and connected to a gravity bag was placed. The identical procedure was repeated for the contralateral right kidney allowing placement of a 10 French percutaneous nephrostomy catheter with end ultimately coiled and locked within the right renal pelvis. Dressings were applied. The patient tolerated procedure well without immediate postprocedural complication. 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 Under a combination of ultrasound and fluoroscopic guidance, a posterior inferior calix was targeted bilaterally allowing placement of bilateral 10 French percutaneous nephrostomy catheters with ends coiled and locked within the bilateral renal pelvises. Contrast injection confirmed appropriate positioning. IMPRESSION: Successful ultrasound and fluoroscopic guided placement of bilateral 10 French PCNs. Electronically Signed   By: Sandi Mariscal M.D.   On: 05/23/2020 12:55    Labs:  CBC: Recent Labs    05/23/20 1542 05/24/20 0233 05/25/20 0422 05/26/20 0450  WBC 9.8 9.3 9.7 9.3  HGB 6.9* 6.4* 9.1* 9.2*  HCT 21.4* 19.4* 27.5* 27.9*  PLT 425* 392 354 393    COAGS: Recent Labs    05/22/20 2313  INR 1.2    BMP: Recent Labs    05/23/20 1542 05/24/20 0343  05/25/20 0500 05/26/20 0450  NA 138 137 139 144  K 4.0 4.1 3.6 3.3*  CL 107 108 109 108  CO2 16* 18* 18* 25  GLUCOSE 100* 115* 93 100*  BUN 87* 84* 68* 46*  CALCIUM 8.3* 7.8* 7.8* 8.5*  CREATININE 10.74* 9.89* 7.27* 4.83*  GFRNONAA 4* 4* 6* 10*    LIVER FUNCTION TESTS: Recent Labs    05/22/20 1542 05/24/20 0343 05/25/20 0500 05/26/20 0450  BILITOT 0.5  --   --   --   AST 14*  --   --   --   ALT 9  --   --   --   ALKPHOS 62  --   --   --   PROT 7.7  --   --   --   ALBUMIN 3.0* 2.5* 2.4* 2.5*    TUMOR MARKERS: No results for input(s): AFPTM, CEA, CA199, CHROMGRNA in the last 8760 hours.  Assessment and Plan:  54 y/o F who presented to the ED on 10/21 with rectal bleeding, further workup concerning for metastatic malignancy of likely GI primary. Patient planned for colonoscopy with GI tomorrow and IR has been asked to perform a liver lesion biopsy today to further direct care.  Patient has been NPO since midnight, no current anticoagulation/antiplatelet medications. Afebrile, WBC 9.3, hgb 9.2, plt 393, INR 1.1.  Risks and benefits of liver lesion biopsy was discussed with the patient and/or patient's family including, but not limited to bleeding, infection, damage  to adjacent structures or low yield requiring additional tests.  All of the questions were answered and there is agreement to proceed.  Consent signed and in chart.  Thank you for this interesting consult.  I greatly enjoyed meeting Linsy Ehresman and look forward to participating in their care.  A copy of this report was sent to the requesting provider on this date.  Electronically Signed: Joaquim Nam, PA-C 05/26/2020, 9:17 AM   I spent a total of 20 Minutes  in face to face in clinical consultation, greater than 50% of which was counseling/coordinating care for liver lesion biopsy.

## 2020-05-26 NOTE — Procedures (Signed)
Interventional Radiology Procedure Note ? ?Procedure: US Guided Biopsy of liver mass ? ?Complications: None ? ?Estimated Blood Loss: < 10 mL ? ?Findings: ?18 G core biopsy of right lobe liver mass performed under US guidance.  Three core samples obtained and sent to Pathology. ? ?Baeleigh Devincent T. Amoni Scallan, M.D ?Pager:  [phone] ? ?  ?

## 2020-05-26 NOTE — Progress Notes (Signed)
[facility]  ROUNDING NOTE   Subjective:   Patient resting in bed, in no acute distress. She reports bright red rectal bleeding and mild abdominal discomfort,waiting for liver biopsy. She is NPO for the procedure.  Objective:  Vital signs in last 24 hours:  Temp:  [97.7 F (36.5 C)-99 F (37.2 C)] 97.7 F (36.5 C) (10/25 0834) Pulse Rate:  [81-90] 89 (10/25 0834) Resp:  [13-19] 19 (10/25 0834) BP: (96-120)/(53-76) 120/76 (10/25 0834) SpO2:  [96 %-100 %] 99 % (10/25 0834)  Weight change:  Filed Weights   05/22/20 1537  Weight: 59 kg    Intake/Output: I/O last 3 completed shifts: In: 2343.1 [I.V.:2093.1; IV Piggyback:250] Out: 2850 [Urine:2850]   Intake/Output this shift:  No intake/output data recorded.  Physical Exam: General: No acute distress  Head: Normocephalic, atraumatic. Moist oral mucosal membranes  Eyes: Anicteric  Lungs:  Clear to auscultation bilaterally  Heart: Regular rate and rhythm  Abdomen:  Soft,mild tenderness + on epigastric area  Extremities:  No peripheral edema.  Neurologic: Oriented ,moving all four extremities  Skin: No lesions or rashes  Bil.Nephrostomy tubes Blood tinged, amber colored output    Basic Metabolic Panel: Recent Labs  Lab 05/22/20 1542 05/22/20 1542 05/23/20 1542 05/23/20 1542 05/24/20 0343 05/25/20 0500 05/26/20 0450  NA 135  --  138  --  137 139 144  K 4.1  --  4.0  --  4.1 3.6 3.3*  CL 103  --  107  --  108 109 108  CO2 16*  --  16*  --  18* 18* 25  GLUCOSE 108*  --  100*  --  115* 93 100*  BUN 83*  --  87*  --  84* 68* 46*  CREATININE 9.82*  --  10.74*  --  9.89* 7.27* 4.83*  CALCIUM 8.1*   < > 8.3*   < > 7.8* 7.8* 8.5*  MG  --   --   --   --   --   --  1.9  PHOS  --   --   --   --  6.8* 5.5* 4.1   < > = values in this interval not displayed.    Liver Function Tests: Recent Labs  Lab 05/22/20 1542 05/24/20 0343 05/25/20 0500 05/26/20 0450  AST 14*  --   --   --   ALT 9  --   --   --    ALKPHOS 62  --   --   --   BILITOT 0.5  --   --   --   PROT 7.7  --   --   --   ALBUMIN 3.0* 2.5* 2.4* 2.5*   Recent Labs  Lab 05/22/20 1934  LIPASE 27   No results for input(s): AMMONIA in the last 168 hours.  CBC: Recent Labs  Lab 05/22/20 1542 05/23/20 1542 05/24/20 0233 05/25/20 0422 05/26/20 0450  WBC 11.4* 9.8 9.3 9.7 9.3  HGB 6.4* 6.9* 6.4* 9.1* 9.2*  HCT 20.0* 21.4* 19.4* 27.5* 27.9*  MCV 75.8* 77.0* 76.4* 79.9* 79.0*  PLT 456* 425* 392 354 393    Cardiac Enzymes: No results for input(s): CKTOTAL, CKMB, CKMBINDEX, TROPONINI in the last 168 hours.  BNP: Invalid input(s): POCBNP  CBG: No results for input(s): GLUCAP in the last 168 hours.  Microbiology: Results for orders placed or performed during the hospital encounter of 05/22/20  Respiratory Panel by RT PCR (Flu A&B, Covid) - Nasopharyngeal Swab  Status: None   Collection Time: 05/22/20  7:34 PM   Specimen: Nasopharyngeal Swab  Result Value Ref Range Status   SARS Coronavirus 2 by RT PCR NEGATIVE NEGATIVE Final    Comment: (NOTE) SARS-CoV-2 target nucleic acids are NOT DETECTED.  The SARS-CoV-2 RNA is generally detectable in upper respiratoy specimens during the acute phase of infection. The lowest concentration of SARS-CoV-2 viral copies this assay can detect is 131 copies/mL. A negative result does not preclude SARS-Cov-2 infection and should not be used as the sole basis for treatment or other patient management decisions. A negative result may occur with  improper specimen collection/handling, submission of specimen other than nasopharyngeal swab, presence of viral mutation(s) within the areas targeted by this assay, and inadequate number of viral copies (<131 copies/mL). A negative result must be combined with clinical observations, patient history, and epidemiological information. The expected result is Negative.  Fact Sheet for Patients:   PinkCheek.be  Fact Sheet for Healthcare Providers:  GravelBags.it  This test is no t yet approved or cleared by the [place] FDA and  has been authorized for detection and/or diagnosis of SARS-CoV-2 by FDA under an Emergency Use Authorization (EUA). This EUA will remain  in effect (meaning this test can be used) for the duration of the COVID-19 declaration under Section 564(b)(1) of the Act, 21 U.S.C. section 360bbb-3(b)(1), unless the authorization is terminated or revoked sooner.     Influenza A by PCR NEGATIVE NEGATIVE Final   Influenza B by PCR NEGATIVE NEGATIVE Final    Comment: (NOTE) The Xpert Xpress SARS-CoV-2/FLU/RSV assay is intended as an aid in  the diagnosis of influenza from Nasopharyngeal swab specimens and  should not be used as a sole basis for treatment. Nasal washings and  aspirates are unacceptable for Xpert Xpress SARS-CoV-2/FLU/RSV  testing.  Fact Sheet for Patients: PinkCheek.be  Fact Sheet for Healthcare Providers: GravelBags.it  This test is not yet approved or cleared by the [place] FDA and  has been authorized for detection and/or diagnosis of SARS-CoV-2 by  FDA under an Emergency Use Authorization (EUA). This EUA will remain  in effect (meaning this test can be used) for the duration of the  Covid-19 declaration under Section 564(b)(1) of the Act, 21  U.S.C. section 360bbb-3(b)(1), unless the authorization is  terminated or revoked. Performed at [facility], [address] [place], [place] [address]   Gastrointestinal Panel by PCR , Stool     Status: None   Collection Time: 05/24/20  1:00 AM   Specimen: Stool  Result Value Ref Range Status   Campylobacter species NOT DETECTED NOT DETECTED Final   Plesimonas shigelloides NOT DETECTED NOT DETECTED Final   Salmonella species NOT DETECTED NOT DETECTED Final    Yersinia enterocolitica NOT DETECTED NOT DETECTED Final   Vibrio species NOT DETECTED NOT DETECTED Final   Vibrio cholerae NOT DETECTED NOT DETECTED Final   Enteroaggregative E coli (EAEC) NOT DETECTED NOT DETECTED Final   Enteropathogenic E coli (EPEC) NOT DETECTED NOT DETECTED Final   Enterotoxigenic E coli (ETEC) NOT DETECTED NOT DETECTED Final   Shiga like toxin producing E coli (STEC) NOT DETECTED NOT DETECTED Final   Shigella/Enteroinvasive E coli (EIEC) NOT DETECTED NOT DETECTED Final   Cryptosporidium NOT DETECTED NOT DETECTED Final   Cyclospora cayetanensis NOT DETECTED NOT DETECTED Final   Entamoeba histolytica NOT DETECTED NOT DETECTED Final   Giardia lamblia NOT DETECTED NOT DETECTED Final   Adenovirus F40/41 NOT DETECTED NOT DETECTED Final  Astrovirus NOT DETECTED NOT DETECTED Final   Norovirus GI/GII NOT DETECTED NOT DETECTED Final   Rotavirus A NOT DETECTED NOT DETECTED Final   Sapovirus (I, II, IV, and V) NOT DETECTED NOT DETECTED Final    Comment: Performed at [facility], [place], [place] [address]  C Difficile Quick Screen w PCR reflex     Status: None   Collection Time: 05/24/20  1:00 AM   Specimen: Stool  Result Value Ref Range Status   C Diff antigen NEGATIVE NEGATIVE Final   C Diff toxin NEGATIVE NEGATIVE Final   C Diff interpretation No C. difficile detected.  Final    Comment: Performed at [facility], [address] [place], [place] [address]  Urine Culture     Status: None   Collection Time: 05/25/20  4:31 AM   Specimen: Urine, Catheterized  Result Value Ref Range Status   Specimen Description   Final    URINE, CATHETERIZED Performed at [facility], [address] [place], [place] [address]    Special Requests   Final    NONE Performed at [facility], [address], [place], [place] [address]    Culture   Final    NO GROWTH Performed at [facility], [place] [address]  [place], [place] [address]    Report Status 05/26/2020 FINAL  Final    Coagulation Studies: No results for input(s): LABPROT, INR in the last 72 hours.  Urinalysis: No results for input(s): COLORURINE, LABSPEC, PHURINE, GLUCOSEU, HGBUR, BILIRUBINUR, KETONESUR, PROTEINUR, UROBILINOGEN, NITRITE, LEUKOCYTESUR in the last 72 hours.  Invalid input(s): APPERANCEUR    Imaging: No results found.   Medications:   . lactated ringers 100 mL/hr at 05/26/20 0429   . calcium acetate  667 mg Oral TID WC  . pantoprazole (PROTONIX) IV  40 mg Intravenous Q12H  . potassium chloride  20 mEq Oral Once  . sodium bicarbonate  650 mg Oral TID   ondansetron **OR** ondansetron (ZOFRAN) IV  Assessment/ Plan:  [name] is a 54 y.o.  female presented to the emergency room with the chief complaint of rectal bleeding for last 3 months.CT scan of the abdomen and pelvis that demonstrated marked severe sigmoid colitis numerous, numerous areas of soft tissue nodularity within the mesentery suggestive of an underlying neoplastic process that cannot be excluded heterogeneous liver masses noted suspicious for a neoplasm. Moderately severe Faera aortic and aortocaval lymphadenopathy noted marked severe bilateral hydronephrosis and hydroureter representing partial renal obstruction.  Patient was consulted for nephrology for acute kidney injury.  She has bilateral nephrostomy tubes, placed during this admission.  # Acute kidney injury secondary to obstruction Patient's creatinine 4.83 today,improving BUN 46  Total urine output for the preceding 24 hours is 1,350 ml We will keep monitoring No acute indication for dialysis Continue IV Fluids  #Anemia secondary to chronic disease Patient is with  GI bleed Received PRBC transfusion during this admission Hemoglobin today is 9.2  #Metabolic acidosis On sodium bicarbonate p.o. CO2 25 today   LOS: 4 Anita Sullivan 10/25/20218:48 AM

## 2020-05-26 NOTE — Progress Notes (Signed)
PROGRESS NOTE    Anita Sullivan  ZOX:[id number] DOB: 01/26/1966 DOA: 05/22/2020 PCP: Volney American, PA-C   Brief Narrative:  Anita Sullivan is a 54 y.o. female admitted last night with rectal bleeding.  Ongoing for 3 months on and off.The emergency room her hemoglobin was noted to be 6.4 g with an MCV of 75.8. Creatinine of 9.82 and CO2 of 16. Large amount of blood and leukocytes in the urine. CT scan of the abdomen and pelvis that demonstrated marked severe sigmoid colitis numerous, numerous areas of soft tissue nodularity within the mesentery suggestive of an underlying neoplastic process that cannot be excluded heterogeneous liver masses noted suspicious for a neoplasm.  Moderately severe Faera aortic and aortocaval lymphadenopathy noted marked severe bilateral hydronephrosis and hydroureter representing partial renal obstruction. High concern for extensive malignancy. Patient has quite a bit of unintentional weight loss recently. No family history of malignancy. Multiple specialities were consulted this morning which include GI, urology, nephrology and oncology. She underwent emergent bilateral nephrostomy tube placement by IR and draining well now.   Subjective: Patient has no new complaint today.  Waiting for liver biopsy.  Had some questions regarding whether we are 100% sure that she has a cancer, explained to her that apparently it looks like the case but I cannot be 100% sure until I have some pathology results.  Assessment & Plan:   Principal Problem:   GI bleed Active Problems:   Acute renal failure (HCC)   Obstructive uropathy   Colitis, acute   Bleeding per rectum   Rectal bleed  Lower GI bleed. Symptoms and imaging is concerning for extensive malignancy. Patient had unintentional weight loss and intermittent rectal bleeding going on for about a year now. Patient received total of 3 units of PRBC.  Also received IV iron.  Hemoglobin improved to 9.2 today,  seems stable. Oncology is following-appreciate their recommendations.  Mildly elevated CEA with normal other tumor markers.  Anemia panel with anemia of chronic disease, no significant iron deficiency. Going for colonoscopy on Tuesday. Going for liver biopsy today. -Monitor CBC. -Transfuse if below 8.  Acute kidney injury. Most likely secondary to obstructive uropathy as evident on CT abdomen. Urology was also consulted and according to their advice IR was consulted for bilateral emergent nephrostomy tube placement which was done yesterday. Patient draining well from nephrostomy tubes, both bags with some blood tinted urine one more than the other. Urology signed off -will need replacement of nephrostomy tubes in 4 to 6 weeks by IR. Nephrology consult was also placed-will appreciate their recommendations. Creatinine started trending down, it was 4.83 with BUN of 46 today.  Bicarb at 25.   -Encourage p.o. hydration. -Continue with oral bicarb. -She was started on calcium binders by nephrology yesterday. -Monitor renal function. -Avoid nephrotoxins.  Anion gap metabolic acidosis.  Anion gap resolved.  Lactic acid within normal limit. Most likely secondary to severe acute AKI and GI bleed.  Bicarb started improving and normalized today. -Continue with p.o. bicarb.  -Continue to monitor.  Objective: Vitals:   05/25/20 2314 05/26/20 0419 05/26/20 0834 05/26/20 1216  BP: 107/75 109/75 120/76 115/72  Pulse: 81 81 89 82  Resp:  16 19 20   Temp: 98.1 F (36.7 C) 98.6 F (37 C) 97.7 F (36.5 C) 98.4 F (36.9 C)  TempSrc: Oral Oral Oral   SpO2: 98% 98% 99% 100%  Weight:      Height:        Intake/Output Summary (Last  24 hours) at 05/26/2020 1323 Last data filed at 05/26/2020 0429 Gross per 24 hour  Intake 2093.14 ml  Output 1350 ml  Net 743.14 ml   Filed Weights   05/22/20 1537  Weight: 59 kg    Examination:  General.  Pleasant lady, in no acute distress. Pulmonary.  Lungs  clear bilaterally, normal respiratory effort. CV.  Regular rate and rhythm, no JVD, rub or murmur. Abdomen.  Soft, nontender, nondistended, BS positive.  Bilateral nephrostomy tubes in place and draining CNS.  Alert and oriented x3.  No focal neurologic deficit. Extremities.  No edema, no cyanosis, pulses intact and symmetrical. Psychiatry.  Judgment and insight appears normal.  DVT prophylaxis: SCDs Code Status: Full Family Communication: Discussed with patient. Disposition Plan:  Status is: Inpatient  Remains inpatient appropriate because:Inpatient level of care appropriate due to severity of illness   Dispo: The patient is from: Home              Anticipated d/c is to: Home              Anticipated d/c date is: 3 days              Patient currently is not medically stable to d/c.   Consultants:   GI  IR  Oncology  Nephrology  Procedures:  Antimicrobials:   Data Reviewed: I have personally reviewed following labs and imaging studies  CBC: Recent Labs  Lab 05/22/20 1542 05/23/20 1542 05/24/20 0233 05/25/20 0422 05/26/20 0450  WBC 11.4* 9.8 9.3 9.7 9.3  HGB 6.4* 6.9* 6.4* 9.1* 9.2*  HCT 20.0* 21.4* 19.4* 27.5* 27.9*  MCV 75.8* 77.0* 76.4* 79.9* 79.0*  PLT 456* 425* 392 354 025   Basic Metabolic Panel: Recent Labs  Lab 05/22/20 1542 05/23/20 1542 05/24/20 0343 05/25/20 0500 05/26/20 0450  NA 135 138 137 139 144  K 4.1 4.0 4.1 3.6 3.3*  CL 103 107 108 109 108  CO2 16* 16* 18* 18* 25  GLUCOSE 108* 100* 115* 93 100*  BUN 83* 87* 84* 68* 46*  CREATININE 9.82* 10.74* 9.89* 7.27* 4.83*  CALCIUM 8.1* 8.3* 7.8* 7.8* 8.5*  MG  --   --   --   --  1.9  PHOS  --   --  6.8* 5.5* 4.1   GFR: Estimated Creatinine Clearance: 10.9 mL/min (A) (by C-G formula based on SCr of 4.83 mg/dL (H)). Liver Function Tests: Recent Labs  Lab 05/22/20 1542 05/24/20 0343 05/25/20 0500 05/26/20 0450  AST 14*  --   --   --   ALT 9  --   --   --   ALKPHOS 62  --   --   --     BILITOT 0.5  --   --   --   PROT 7.7  --   --   --   ALBUMIN 3.0* 2.5* 2.4* 2.5*   Recent Labs  Lab 05/22/20 1934  LIPASE 27   No results for input(s): AMMONIA in the last 168 hours. Coagulation Profile: Recent Labs  Lab 05/22/20 2313 05/26/20 0946  INR 1.2 1.1   Cardiac Enzymes: No results for input(s): CKTOTAL, CKMB, CKMBINDEX, TROPONINI in the last 168 hours. BNP (last 3 results) No results for input(s): PROBNP in the last 8760 hours. HbA1C: No results for input(s): HGBA1C in the last 72 hours. CBG: No results for input(s): GLUCAP in the last 168 hours. Lipid Profile: No results for input(s): CHOL, HDL, LDLCALC, TRIG, CHOLHDL, LDLDIRECT in the  last 72 hours. Thyroid Function Tests: No results for input(s): TSH, T4TOTAL, FREET4, T3FREE, THYROIDAB in the last 72 hours. Anemia Panel: Recent Labs    05/23/20 1542  VITAMINB12 529  FOLATE 7.9  FERRITIN 58  TIBC 223*  IRON 45  RETICCTPCT 0.9   Sepsis Labs: Recent Labs  Lab 05/22/20 1934 05/22/20 2312  LATICACIDVEN 1.0 0.6    Recent Results (from the past 240 hour(s))  Respiratory Panel by RT PCR (Flu A&B, Covid) - Nasopharyngeal Swab     Status: None   Collection Time: 05/22/20  7:34 PM   Specimen: Nasopharyngeal Swab  Result Value Ref Range Status   SARS Coronavirus 2 by RT PCR NEGATIVE NEGATIVE Final    Comment: (NOTE) SARS-CoV-2 target nucleic acids are NOT DETECTED.  The SARS-CoV-2 RNA is generally detectable in upper respiratoy specimens during the acute phase of infection. The lowest concentration of SARS-CoV-2 viral copies this assay can detect is 131 copies/mL. A negative result does not preclude SARS-Cov-2 infection and should not be used as the sole basis for treatment or other patient management decisions. A negative result may occur with  improper specimen collection/handling, submission of specimen other than nasopharyngeal swab, presence of viral mutation(s) within the areas targeted by  this assay, and inadequate number of viral copies (<131 copies/mL). A negative result must be combined with clinical observations, patient history, and epidemiological information. The expected result is Negative.  Fact Sheet for Patients:  PinkCheek.be  Fact Sheet for Healthcare Providers:  GravelBags.it  This test is no t yet approved or cleared by the [place] FDA and  has been authorized for detection and/or diagnosis of SARS-CoV-2 by FDA under an Emergency Use Authorization (EUA). This EUA will remain  in effect (meaning this test can be used) for the duration of the COVID-19 declaration under Section 564(b)(1) of the Act, 21 U.S.C. section 360bbb-3(b)(1), unless the authorization is terminated or revoked sooner.     Influenza A by PCR NEGATIVE NEGATIVE Final   Influenza B by PCR NEGATIVE NEGATIVE Final    Comment: (NOTE) The Xpert Xpress SARS-CoV-2/FLU/RSV assay is intended as an aid in  the diagnosis of influenza from Nasopharyngeal swab specimens and  should not be used as a sole basis for treatment. Nasal washings and  aspirates are unacceptable for Xpert Xpress SARS-CoV-2/FLU/RSV  testing.  Fact Sheet for Patients: PinkCheek.be  Fact Sheet for Healthcare Providers: GravelBags.it  This test is not yet approved or cleared by the [place] FDA and  has been authorized for detection and/or diagnosis of SARS-CoV-2 by  FDA under an Emergency Use Authorization (EUA). This EUA will remain  in effect (meaning this test can be used) for the duration of the  Covid-19 declaration under Section 564(b)(1) of the Act, 21  U.S.C. section 360bbb-3(b)(1), unless the authorization is  terminated or revoked. Performed at [facility], [address] [place], [place] [address]   Gastrointestinal Panel by PCR , Stool     Status: None   Collection  Time: 05/24/20  1:00 AM   Specimen: Stool  Result Value Ref Range Status   Campylobacter species NOT DETECTED NOT DETECTED Final   Plesimonas shigelloides NOT DETECTED NOT DETECTED Final   Salmonella species NOT DETECTED NOT DETECTED Final   Yersinia enterocolitica NOT DETECTED NOT DETECTED Final   Vibrio species NOT DETECTED NOT DETECTED Final   Vibrio cholerae NOT DETECTED NOT DETECTED Final   Enteroaggregative E coli (EAEC) NOT DETECTED NOT DETECTED Final  Enteropathogenic E coli (EPEC) NOT DETECTED NOT DETECTED Final   Enterotoxigenic E coli (ETEC) NOT DETECTED NOT DETECTED Final   Shiga like toxin producing E coli (STEC) NOT DETECTED NOT DETECTED Final   Shigella/Enteroinvasive E coli (EIEC) NOT DETECTED NOT DETECTED Final   Cryptosporidium NOT DETECTED NOT DETECTED Final   Cyclospora cayetanensis NOT DETECTED NOT DETECTED Final   Entamoeba histolytica NOT DETECTED NOT DETECTED Final   Giardia lamblia NOT DETECTED NOT DETECTED Final   Adenovirus F40/41 NOT DETECTED NOT DETECTED Final   Astrovirus NOT DETECTED NOT DETECTED Final   Norovirus GI/GII NOT DETECTED NOT DETECTED Final   Rotavirus A NOT DETECTED NOT DETECTED Final   Sapovirus (I, II, IV, and V) NOT DETECTED NOT DETECTED Final    Comment: Performed at [facility], [address] [place], [place] [address]  C Difficile Quick Screen w PCR reflex     Status: None   Collection Time: 05/24/20  1:00 AM   Specimen: Stool  Result Value Ref Range Status   C Diff antigen NEGATIVE NEGATIVE Final   C Diff toxin NEGATIVE NEGATIVE Final   C Diff interpretation No C. difficile detected.  Final    Comment: Performed at [facility], [address] [place], [place] [address]  Urine Culture     Status: None   Collection Time: 05/25/20  4:31 AM   Specimen: Urine, Catheterized  Result Value Ref Range Status   Specimen Description   Final    URINE, CATHETERIZED Performed at [facility], [address] [place], [place] [address]    Special Requests   Final    NONE Performed at [facility], [address], [place], [place] [address]    Culture   Final    NO GROWTH Performed at [facility], [place] [place], [place] [address]    Report Status 05/26/2020 FINAL  Final     Radiology Studies: No results found.  Scheduled Meds: . fentaNYL      . midazolam      . calcium acetate  667 mg Oral TID WC  . pantoprazole (PROTONIX) IV  40 mg Intravenous Q12H  . potassium chloride  20 mEq Oral Once  . sodium bicarbonate  650 mg Oral TID   Continuous Infusions: . lactated ringers 100 mL/hr at 05/26/20 0429     LOS: 4 days   Time spent: 30 minutes. More than 50% of the time was spent with direct patient care and coordinating with other specialities.  Lorella Nimrod, MD [facility]  If 7PM-7AM, please contact night-coverage [email or web address]  05/26/2020, 1:23 PM   This record has been created using Systems analyst. Errors have been sought and corrected,but may not always be located. Such creation errors do not reflect on the standard of care.

## 2020-05-26 NOTE — Progress Notes (Signed)
Mobility Specialist - Progress Note   05/26/20 1600  Mobility  Activity Ambulated in room  Range of Motion/Exercises Right leg;Left leg (heel slides, quad sets, slr, hip add/abd)  Level of Assistance Independent  Assistive Device None (Pt pushed IV pole)  Distance Ambulated (ft) 10 ft  Mobility Response Tolerated well  Mobility performed by Mobility specialist  $Mobility charge 1 Mobility    Pre-mobility: 83 HR, 99% SpO2 Post-mobility: 87 HR, 99% SpO2   Pt was ambulating to/from bathroom upon arrival with daughter present in room. Pt agreed to session. Pt denied any pain, nausea, or fatigue. Noted pt is independent in ambulation, pushing IV pole. Pt is also independent in bed mobility and transfers EOB-supine with no assistance. Pt was able to perform supine exercises: heel slides, quad sets, straight leg raises, and hip add/abd independently. Overall, pt tolerated session well. Pt remains in bed with all needs in reach.   Kathee Delton Mobility Specialist 05/26/20, 4:46 PM

## 2020-05-26 NOTE — Progress Notes (Signed)
Cephas Darby, MD [address]  [place]  [place], [place] [address]  Main: [phone]  Fax: [phone] Pager: [phone]   Subjective: No acute events overnight.  Patient denies abdominal pain.  She is waiting for liver biopsy that is scheduled for today.   Objective: Vital signs in last 24 hours: Vitals:   05/26/20 1420 05/26/20 1430 05/26/20 1445 05/26/20 1500  BP: 114/76 110/64 113/64 103/88  Pulse: 76 77 75 81  Resp: 19 19 (!) 22 (!) 22  Temp:      TempSrc:      SpO2: 100% 97% 98% 100%  Weight:      Height:       Weight change:   Intake/Output Summary (Last 24 hours) at 05/26/2020 1624 Last data filed at 05/26/2020 0429 Gross per 24 hour  Intake 2093.14 ml  Output 1100 ml  Net 993.14 ml     Exam: Heart:: Regular rate and rhythm, S1S2 present or without murmur or extra heart sounds Lungs: normal and clear to auscultation Abdomen: soft, nontender, normal bowel sounds   Lab Results: CBC Latest Ref Rng & Units 05/26/2020 05/25/2020 05/24/2020  WBC 4.0 - 10.5 K/uL 9.3 9.7 9.3  Hemoglobin 12.0 - 15.0 g/dL 9.2(L) 9.1(L) 6.4(L)  Hematocrit 36 - 46 % 27.9(L) 27.5(L) 19.4(L)  Platelets 150 - 400 K/uL 393 354 392   CMP Latest Ref Rng & Units 05/26/2020 05/25/2020 05/24/2020  Glucose 70 - 99 mg/dL 100(H) 93 115(H)  BUN 6 - 20 mg/dL 46(H) 68(H) 84(H)  Creatinine 0.44 - 1.00 mg/dL 4.83(H) 7.27(H) 9.89(H)  Sodium 135 - 145 mmol/L 144 139 137  Potassium 3.5 - 5.1 mmol/L 3.3(L) 3.6 4.1  Chloride 98 - 111 mmol/L 108 109 108  CO2 22 - 32 mmol/L 25 18(L) 18(L)  Calcium 8.9 - 10.3 mg/dL 8.5(L) 7.8(L) 7.8(L)  Total Protein 6.5 - 8.1 g/dL - - -  Total Bilirubin 0.3 - 1.2 mg/dL - - -  Alkaline Phos 38 - 126 U/L - - -  AST 15 - 41 U/L - - -  ALT 0 - 44 U/L - - -    Micro Results: Recent Results (from the past 240 hour(s))  Respiratory Panel by RT PCR (Flu A&B, Covid) - Nasopharyngeal Swab     Status: None   Collection Time: 05/22/20  7:34 PM    Specimen: Nasopharyngeal Swab  Result Value Ref Range Status   SARS Coronavirus 2 by RT PCR NEGATIVE NEGATIVE Final    Comment: (NOTE) SARS-CoV-2 target nucleic acids are NOT DETECTED.  The SARS-CoV-2 RNA is generally detectable in upper respiratoy specimens during the acute phase of infection. The lowest concentration of SARS-CoV-2 viral copies this assay can detect is 131 copies/mL. A negative result does not preclude SARS-Cov-2 infection and should not be used as the sole basis for treatment or other patient management decisions. A negative result may occur with  improper specimen collection/handling, submission of specimen other than nasopharyngeal swab, presence of viral mutation(s) within the areas targeted by this assay, and inadequate number of viral copies (<131 copies/mL). A negative result must be combined with clinical observations, patient history, and epidemiological information. The expected result is Negative.  Fact Sheet for Patients:  PinkCheek.be  Fact Sheet for Healthcare Providers:  GravelBags.it  This test is no t yet approved or cleared by the [place] FDA and  has been authorized for detection and/or diagnosis of SARS-CoV-2 by FDA under an Emergency Use Authorization (EUA). This EUA  will remain  in effect (meaning this test can be used) for the duration of the COVID-19 declaration under Section 564(b)(1) of the Act, 21 U.S.C. section 360bbb-3(b)(1), unless the authorization is terminated or revoked sooner.     Influenza A by PCR NEGATIVE NEGATIVE Final   Influenza B by PCR NEGATIVE NEGATIVE Final    Comment: (NOTE) The Xpert Xpress SARS-CoV-2/FLU/RSV assay is intended as an aid in  the diagnosis of influenza from Nasopharyngeal swab specimens and  should not be used as a sole basis for treatment. Nasal washings and  aspirates are unacceptable for Xpert Xpress SARS-CoV-2/FLU/RSV   testing.  Fact Sheet for Patients: PinkCheek.be  Fact Sheet for Healthcare Providers: GravelBags.it  This test is not yet approved or cleared by the [place] FDA and  has been authorized for detection and/or diagnosis of SARS-CoV-2 by  FDA under an Emergency Use Authorization (EUA). This EUA will remain  in effect (meaning this test can be used) for the duration of the  Covid-19 declaration under Section 564(b)(1) of the Act, 21  U.S.C. section 360bbb-3(b)(1), unless the authorization is  terminated or revoked. Performed at [facility], [address] [place], [place] [address]   Gastrointestinal Panel by PCR , Stool     Status: None   Collection Time: 05/24/20  1:00 AM   Specimen: Stool  Result Value Ref Range Status   Campylobacter species NOT DETECTED NOT DETECTED Final   Plesimonas shigelloides NOT DETECTED NOT DETECTED Final   Salmonella species NOT DETECTED NOT DETECTED Final   Yersinia enterocolitica NOT DETECTED NOT DETECTED Final   Vibrio species NOT DETECTED NOT DETECTED Final   Vibrio cholerae NOT DETECTED NOT DETECTED Final   Enteroaggregative E coli (EAEC) NOT DETECTED NOT DETECTED Final   Enteropathogenic E coli (EPEC) NOT DETECTED NOT DETECTED Final   Enterotoxigenic E coli (ETEC) NOT DETECTED NOT DETECTED Final   Shiga like toxin producing E coli (STEC) NOT DETECTED NOT DETECTED Final   Shigella/Enteroinvasive E coli (EIEC) NOT DETECTED NOT DETECTED Final   Cryptosporidium NOT DETECTED NOT DETECTED Final   Cyclospora cayetanensis NOT DETECTED NOT DETECTED Final   Entamoeba histolytica NOT DETECTED NOT DETECTED Final   Giardia lamblia NOT DETECTED NOT DETECTED Final   Adenovirus F40/41 NOT DETECTED NOT DETECTED Final   Astrovirus NOT DETECTED NOT DETECTED Final   Norovirus GI/GII NOT DETECTED NOT DETECTED Final   Rotavirus A NOT DETECTED NOT DETECTED Final   Sapovirus (I, II, IV, and V)  NOT DETECTED NOT DETECTED Final    Comment: Performed at [facility], [address] [place], [place] [address]  C Difficile Quick Screen w PCR reflex     Status: None   Collection Time: 05/24/20  1:00 AM   Specimen: Stool  Result Value Ref Range Status   C Diff antigen NEGATIVE NEGATIVE Final   C Diff toxin NEGATIVE NEGATIVE Final   C Diff interpretation No C. difficile detected.  Final    Comment: Performed at [facility], [address] [place], [place] [address]  Urine Culture     Status: None   Collection Time: 05/25/20  4:31 AM   Specimen: Urine, Catheterized  Result Value Ref Range Status   Specimen Description   Final    URINE, CATHETERIZED Performed at [facility], [address] [place],  [address]    Special Requests   Final    NONE Performed at [facility], [place], [place]  [address]    Culture   Final    NO GROWTH Performed at [facility], [place] [address] [place], [place] [address]    Report Status 05/26/2020 FINAL  Final   Studies/Results: US BIOPSY (LIVER)  Result Date: 05/26/2020 INDICATION: Rectal mass with associated liver masses. EXAM: ULTRASOUND GUIDED CORE BIOPSY OF LIVER MEDICATIONS: None. ANESTHESIA/SEDATION: Fentanyl 1.0 mcg IV; Versed 50 mg IV Moderate Sedation Time:  15 minutes. The patient was continuously monitored during the procedure by the interventional radiology nurse under my direct supervision. PROCEDURE: The procedure, risks, benefits, and alternatives were explained to the patient. Questions regarding the procedure were encouraged and answered. The patient understands and consents to the procedure. A time-out was performed prior to initiating the procedure. Ultrasound was performed to localize liver lesions. The abdominal wall was prepped with chlorhexidine in a sterile fashion, and a sterile drape was applied covering the operative field. A sterile gown and sterile gloves  were used for the procedure. Local anesthesia was provided with 1% Lidocaine. Under ultrasound guidance, a 49 gauge trocar needle was advanced into the liver at the level of a lesion within the right lobe. Three separate coaxial 18 gauge core biopsy samples were obtained with an 18 gauge device. Material was submitted in formalin. Gel-Foam pledgets were advanced through the outer needle as it was retracted and removed. Additional ultrasound was performed. COMPLICATIONS: None immediate. FINDINGS: The largest mass in the liver as noted by CT is within the inferior aspect of the right lobe. By ultrasound this lesion measures approximately 6.5 x 5.1 x 6.1 cm. Solid tissue was obtained. IMPRESSION: Ultrasound-guided core biopsy performed at the level of a 6.5 cm mass within the inferior aspect of the right lobe of the liver. Electronically Signed   By: Aletta Edouard M.D.   On: 05/26/2020 15:27   Medications:  I have reviewed the patient's current medications. Prior to Admission:  Medications Prior to Admission  Medication Sig Dispense Refill Last Dose  . acetaminophen (TYLENOL) 500 MG tablet Take 1,000 mg by mouth every 6 (six) hours as needed. (Patient not taking: Reported on 05/22/2020)   Not Taking at Unknown time   Scheduled: . calcium acetate  667 mg Oral TID WC  . fentaNYL      . magnesium citrate  1 Bottle Oral Once  . midazolam      . pantoprazole (PROTONIX) IV  40 mg Intravenous Q12H  . polyethylene glycol powder  1 Container Oral Once  . potassium chloride  20 mEq Oral Once  . sodium bicarbonate  650 mg Oral TID   Continuous: . sodium chloride    . lactated ringers 1,000 mL (05/26/20 1528)   DGU:[id number] **OR** ondansetron (ZOFRAN) IV Anti-infectives (From admission, onward)   Start     Dose/Rate Route Frequency Ordered Stop   05/23/20 1100  ceFAZolin (ANCEF) IVPB 2g/100 mL premix        2 g 200 mL/hr over 30 Minutes Intravenous To Radiology 05/23/20 1046 05/24/20 1100    05/23/20 1049  ceFAZolin (ANCEF) 2-4 GM/100ML-% IVPB       Note to Pharmacy: Genelle Bal   : cabinet override      05/23/20 1049 05/23/20 2259     Scheduled Meds: . calcium acetate  667 mg Oral TID WC  . fentaNYL      . magnesium citrate  1 Bottle Oral Once  . midazolam      . pantoprazole (PROTONIX) IV  40 mg Intravenous Q12H  .  polyethylene glycol powder  1 Container Oral Once  . potassium chloride  20 mEq Oral Once  . sodium bicarbonate  650 mg Oral TID   Continuous Infusions: . sodium chloride    . lactated ringers 1,000 mL (05/26/20 1528)   PRN Meds:.ondansetron **OR** ondansetron (ZOFRAN) IV   Assessment: Principal Problem:   GI bleed Active Problems:   Acute renal failure (HCC)   Obstructive uropathy   Colitis, acute   Bleeding per rectum   Rectal bleed  Left-sided colitis, stool studies negative for infection AKI, significantly improving  Plan: Discussed about colonoscopy tomorrow and patient is agreeable Clear liquid diet today, n.p.o. effective a.m. tomorrow MiraLAX bowel prep ordered   LOS: 4 days   Areen Trautner 05/26/2020, 4:24 PM

## 2020-05-27 ENCOUNTER — Encounter
Admission: EM | Disposition: A | Discharge status: Home / Self Care | Payer: Self-pay | Source: Home / Self Care | Attending: Internal Medicine

## 2020-05-27 ENCOUNTER — Inpatient Hospital Stay: Payer: Medicaid Other | Admitting: Certified Registered"

## 2020-05-27 ENCOUNTER — Other Ambulatory Visit: Payer: Self-pay

## 2020-05-27 ENCOUNTER — Inpatient Hospital Stay: Payer: Medicaid Other

## 2020-05-27 ENCOUNTER — Encounter: Payer: Self-pay | Admitting: Internal Medicine

## 2020-05-27 DIAGNOSIS — K6289 Other specified diseases of anus and rectum: Secondary | ICD-10-CM

## 2020-05-27 DIAGNOSIS — C19 Malignant neoplasm of rectosigmoid junction: Secondary | ICD-10-CM

## 2020-05-27 HISTORY — PX: COLONOSCOPY WITH PROPOFOL: SHX5780

## 2020-05-27 LAB — CBC
HCT: 28.8 % — ABNORMAL LOW (ref 36.0–46.0)
Hemoglobin: 9.3 g/dL — ABNORMAL LOW (ref 12.0–15.0)
MCH: 26.3 pg (ref 26.0–34.0)
MCHC: 32.3 g/dL (ref 30.0–36.0)
MCV: 81.6 fL (ref 80.0–100.0)
Platelets: 423 10*3/uL — ABNORMAL HIGH (ref 150–400)
RBC: 3.53 MIL/uL — ABNORMAL LOW (ref 3.87–5.11)
RDW: 17 % — ABNORMAL HIGH (ref 11.5–15.5)
WBC: 9.8 10*3/uL (ref 4.0–10.5)
nRBC: 0 % (ref 0.0–0.2)

## 2020-05-27 LAB — RENAL FUNCTION PANEL
Albumin: 2.7 g/dL — ABNORMAL LOW (ref 3.5–5.0)
Anion gap: 10 (ref 5–15)
BUN: 26 mg/dL — ABNORMAL HIGH (ref 6–20)
CO2: 25 mmol/L (ref 22–32)
Calcium: 8.4 mg/dL — ABNORMAL LOW (ref 8.9–10.3)
Chloride: 103 mmol/L (ref 98–111)
Creatinine, Ser: 2.99 mg/dL — ABNORMAL HIGH (ref 0.44–1.00)
GFR, Estimated: 18 mL/min — ABNORMAL LOW (ref >60)
Glucose, Bld: 116 mg/dL — ABNORMAL HIGH (ref 70–99)
Phosphorus: 2.9 mg/dL (ref 2.5–4.6)
Potassium: 3.6 mmol/L (ref 3.5–5.1)
Sodium: 138 mmol/L (ref 135–145)

## 2020-05-27 SURGERY — COLONOSCOPY WITH PROPOFOL
Anesthesia: General

## 2020-05-27 MED ORDER — EPHEDRINE SULFATE 50 MG/ML IJ SOLN
INTRAMUSCULAR | Status: DC | PRN
  Administered 2020-05-27: 5 mg via INTRAVENOUS

## 2020-05-27 MED ORDER — SODIUM CHLORIDE 0.9 % IV SOLN: INTRAVENOUS | Status: DC

## 2020-05-27 MED ORDER — LIDOCAINE HCL (CARDIAC) PF 100 MG/5ML IV SOSY
PREFILLED_SYRINGE | INTRAVENOUS | Status: DC | PRN
  Administered 2020-05-27: 100 mg via INTRAVENOUS

## 2020-05-27 MED ORDER — GLYCOPYRROLATE 0.2 MG/ML IJ SOLN
INTRAMUSCULAR | Status: DC | PRN
  Administered 2020-05-27: .2 mg via INTRAVENOUS

## 2020-05-27 MED ORDER — PHENYLEPHRINE HCL (PRESSORS) 10 MG/ML IV SOLN
INTRAVENOUS | Status: DC | PRN
  Administered 2020-05-27: 100 ug via INTRAVENOUS
  Administered 2020-05-27: 200 ug via INTRAVENOUS

## 2020-05-27 MED ORDER — PROPOFOL 10 MG/ML IV BOLUS
INTRAVENOUS | Status: DC | PRN
  Administered 2020-05-27: 50 mg via INTRAVENOUS

## 2020-05-27 MED ORDER — PROPOFOL 500 MG/50ML IV EMUL
INTRAVENOUS | Status: DC | PRN
  Administered 2020-05-27: 165 ug/kg/min via INTRAVENOUS

## 2020-05-27 NOTE — Progress Notes (Addendum)
Patient had 2 episodes of moderate amount of BRBPR. The first had several clots in it. Dr Marius Ditch is aware of the potential for bleeding and may do hemospray in endoscopy tomorrow if needed. This happened shortly after the patient had a shower. Shower approved by Dr Reesa Chew

## 2020-05-27 NOTE — Consult Note (Signed)
[facility] SURGICAL CONSULTATION NOTE (initial) - cpt: [id number]   HISTORY OF PRESENT ILLNESS (HPI):  54 y.o. female who presented to [facility] ED initially on 10/21 for evaluation of bright red blood per rectum. At the time of presentation, the patient reported that she had been noticing intermittent rectal bleeding with stools for around 2 months. This was mild at first; however, in the week before presentation, she reported two episodes of "gushing bright red blood in the toilet bowl." She thought maybe this was attributed to undiagnosed IBS. She had associated mild suprapubic abdominal discomfort which radiated to her flanks over this time as well. She denied any fever, chills, nausea, emesis, urinary changes, diaphoresis. She is not on any anticoagulation. No colonoscopies prior. Work up in the ED was concerning for anemia with Hgb of 6.4, severe AKI with sCr - 9.82, and CT Abdomen/Pelvis was concerning for possible sigmoid colitis however there was nodularity of the mesentery with associated liver masses and lymphadenopathy resulting in severe bilateral hydronephrosis, which in combination was concerning for metastatic neoplasm of unknown origin. She was admitted to the hospitalist service and transfused 2 units. IR was consulted and she had bilateral percutaneous nephrostomy tubes placed on 10/22 with Dr Pascal Lux. She was also see by GI at that time as well. CEA was obtained and elevated at 9.9. all other tumor markers were normal. Dr Grayland Ormond (oncology) was consulted on 10/23. Patient underwent liver biopsy on 10/25 with Dr Kathlene Cote (IR) and the pathology is still pending. Her renal function ultimately improved and Hgb stabilized (after total 3 units pRBCs) without further bleeding. She underwent colonoscopy today (10/26) with Dr Marius Ditch which was concerning for ulcerated mass approximately 2 cm from the anal verge which was circumferential but only partially obstructing. Biopsies were taken of this  and pending.   Surgery is consulted by hospitalist physician Dr. Lorella Nimrod, MD in this context for evaluation and management of partial obstructing rectal mass, likely malignant, in setting of GI bleeding.   PAST MEDICAL HISTORY (PMH):  History reviewed. No pertinent past medical history.   PAST SURGICAL HISTORY ([place]):  Past Surgical History:  Procedure Laterality Date  . IR NEPHROSTOMY PLACEMENT LEFT  05/23/2020  . IR NEPHROSTOMY PLACEMENT RIGHT  05/23/2020     MEDICATIONS:  Prior to Admission medications   Medication Sig Start Date End Date Taking? Authorizing Provider  acetaminophen (TYLENOL) 500 MG tablet Take 1,000 mg by mouth every 6 (six) hours as needed. Patient not taking: Reported on 05/22/2020    [provider]     ALLERGIES:  No Known Allergies   SOCIAL HISTORY:  Social History   Socioeconomic History  . Marital status: Divorced    Spouse name: Not on file  . Number of children: Not on file  . Years of education: Not on file  . Highest education level: Not on file  Occupational History  . Not on file  Tobacco Use  . Smoking status: Never Smoker  . Smokeless tobacco: Never Used  Vaping Use  . Vaping Use: Never used  Substance and Sexual Activity  . Alcohol use: Never  . Drug use: Never  . Sexual activity: Not Currently  Other Topics Concern  . Not on file  Social History Narrative  . Not on file   Social Determinants of Health   Financial Resource Strain:   . Difficulty of Paying Living Expenses: Not on file  Food Insecurity:   . Worried About Charity fundraiser in the  Last Year: Not on file  . Ran Out of Food in the Last Year: Not on file  Transportation Needs:   . Lack of Transportation (Medical): Not on file  . Lack of Transportation (Non-Medical): Not on file  Physical Activity:   . Days of Exercise per Week: Not on file  . Minutes of Exercise per Session: Not on file  Stress:   . Feeling of Stress : Not on file  Social  Connections:   . Frequency of Communication with Friends and Family: Not on file  . Frequency of Social Gatherings with Friends and Family: Not on file  . Attends Religious Services: Not on file  . Active Member of Clubs or Organizations: Not on file  . Attends Archivist Meetings: Not on file  . Marital Status: Not on file  Intimate Partner Violence:   . Fear of Current or Ex-Partner: Not on file  . Emotionally Abused: Not on file  . Physically Abused: Not on file  . Sexually Abused: Not on file     FAMILY HISTORY:  Family History  Problem Relation Age of Onset  . Other Mother        Corticobasal degeneration  . Cancer Mother        Uterine  . Diabetes Maternal Grandmother   . Stroke Maternal Grandfather   . Stroke Paternal Grandfather   . Heart disease Father       REVIEW OF SYSTEMS:  Review of Systems  Constitutional: Positive for weight loss. Negative for chills and fever.  HENT: Negative for congestion and sore throat.   Respiratory: Negative for cough and shortness of breath.   Cardiovascular: Negative for chest pain and palpitations.  Gastrointestinal: Positive for abdominal pain and blood in stool. Negative for constipation, nausea and vomiting.  Genitourinary: Positive for flank pain. Negative for dysuria, hematuria and urgency.  All other systems reviewed and are negative.   VITAL SIGNS:  Temp:  [97.7 F (36.5 C)-98.1 F (36.7 C)] 98.1 F (36.7 C) (10/26 1108) Pulse Rate:  [70-94] 70 (10/26 1239) Resp:  [8-22] 19 (10/26 1239) BP: (102-132)/(54-95) 132/65 (10/26 1239) SpO2:  [97 %-100 %] 99 % (10/26 1239) Weight:  [59 kg] 59 kg (10/26 1108)     Height: 5' (152.4 cm) Weight: 59 kg BMI (Calculated): 25.4   INTAKE/OUTPUT:  10/25 0701 - 10/26 0700 In: 2362.1 [I.V.:2362.1] Out: 324 [Urine:475]  PHYSICAL EXAM:  Physical Exam Constitutional:      General: She is not in acute distress.    Appearance: Normal appearance. She is normal weight. She is  not ill-appearing.  HENT:     Head: Normocephalic and atraumatic.  Eyes:     General: No scleral icterus.    Conjunctiva/sclera: Conjunctivae normal.  Cardiovascular:     Rate and Rhythm: Normal rate.     Pulses: Normal pulses.     Heart sounds: No murmur heard.   Pulmonary:     Effort: Pulmonary effort is normal. No respiratory distress.     Breath sounds: Normal breath sounds.  Abdominal:     General: Abdomen is flat.     Tenderness: There is no abdominal tenderness. There is no guarding.     Hernia: No hernia is present.    Genitourinary:    Comments: Deferred Musculoskeletal:     Right lower leg: No edema.     Left lower leg: No edema.  Skin:    General: Skin is warm and dry.     Coloration:  Skin is not pale.     Findings: No erythema.  Neurological:     General: No focal deficit present.     Mental Status: She is alert and oriented to person, place, and time.  Psychiatric:        Mood and Affect: Mood normal.        Behavior: Behavior normal.      Labs:  CBC Latest Ref Rng & Units 05/27/2020 05/26/2020 05/25/2020  WBC 4.0 - 10.5 K/uL 9.8 9.3 9.7  Hemoglobin 12.0 - 15.0 g/dL 9.3(L) 9.2(L) 9.1(L)  Hematocrit 36 - 46 % 28.8(L) 27.9(L) 27.5(L)  Platelets 150 - 400 K/uL 423(H) 393 354   CMP Latest Ref Rng & Units 05/27/2020 05/26/2020 05/25/2020  Glucose 70 - 99 mg/dL 116(H) 100(H) 93  BUN 6 - 20 mg/dL 26(H) 46(H) 68(H)  Creatinine 0.44 - 1.00 mg/dL 2.99(H) 4.83(H) 7.27(H)  Sodium 135 - 145 mmol/L 138 144 139  Potassium 3.5 - 5.1 mmol/L 3.6 3.3(L) 3.6  Chloride 98 - 111 mmol/L 103 108 109  CO2 22 - 32 mmol/L 25 25 18(L)  Calcium 8.9 - 10.3 mg/dL 8.4(L) 8.5(L) 7.8(L)  Total Protein 6.5 - 8.1 g/dL - - -  Total Bilirubin 0.3 - 1.2 mg/dL - - -  Alkaline Phos 38 - 126 U/L - - -  AST 15 - 41 U/L - - -  ALT 0 - 44 U/L - - -     Imaging studies:   CT Abdomen/Pelvis (05/22/2020) personally reviewed with inflammation fo the rectosigmoid colon with surrounding  nodularity, liver masses present, lymphadenopathy in mid-abdomen with associated severe hydronephrosis, and radiologist report reviewed below:  IMPRESSION: 1. Marked severity sigmoid colitis. Due to the numerous areas of soft tissue nodularity within the mesentery in this region, an underlying neoplastic process cannot be excluded. Correlation with colonoscopy is recommended. 2. Heterogeneous liver masses, as described above, suspicious for an underlying neoplasm. MRI correlation is recommended. 3. Moderate severity para-aortic and aortocaval lymphadenopathy with an additional enlarged, irregular appearing lymph node seen along the posterior aspect of the distal left iliac vessels. This is also worrisome for sequelae associated with an underlying neoplasm. 4. Marked severity bilateral hydronephrosis and hydroureter which likely represents partial renal obstruction secondary to the inflammatory and suspected neoplastic process seen within the pelvis.   Assessment/Plan: (ICD-10's: K73.89) 54 y.o. female presenting with bright red blood per rectum, found to have patrtially obstructing ulcerated rectal mass approximately 2 cm from the anal verge, concerning for metastatic neoplasm given concomitant liver masses (biopsied, pathology pending), ara-aortic and aortocaval lymphadenopathy, and hydronephrosis s/p bilateral percutaneous nephrostomy tube placement on 10/22.    - As this is rectal mass is not completely obstructing and GI bleeding appears controlled at this time, I do not think she warrants any emergent surgical interventions nor diversions. I do believe her case should be referred to colorectal surgeon given the proximity to the anal verge.   - Should she require neoadjuvant chemotherapy, we would be happy to place port-a-catheter this admission, defer decision and timing to oncology & Dr Kirke Corin  - Awaiting final pathology results; will follow  - Monitor rectal bleeding & H&H; transfuse  as needed  - Continue full liquids for now; follow GI recommendations   - Monitor abdominal examination; on-going bowel function   - Nephrology following; no need for renal replacement at this time; continue PCNs; monitor and record output  - Mobilization encouraged  - Further manage per primary service; we will follow  All of the above findings and recommendations were discussed with the patient, and all of patient's questions were answered to her expressed satisfaction.  Thank you for the opportunity to participate in this patient's care.   -- Edison Simon, PA-C  [facility] 05/27/2020, 12:56 PM [phone] M-F: 7am - 4pm

## 2020-05-27 NOTE — Op Note (Signed)
[facility] Gastroenterology Patient Name: Anita Sullivan Procedure Date: 05/27/2020 12:15 PM MRN: [id number] Account #: [id number] Date of Birth: [birth year] Admit Type: Outpatient Age: 54 Room: [facility] ENDO ROOM 4 Gender: Female Note Status: Finalized Procedure:             Colonoscopy Indications:           Rectal bleeding Providers:             [name] MD, MD Medicines:             General Anesthesia Complications:         No immediate complications. Estimated blood loss:                         Minimal. Procedure:             Pre-Anesthesia Assessment:                        - Prior to the procedure, a History and Physical was                         performed, and patient medications and allergies were                         reviewed. The patient is competent. The risks and                         benefits of the procedure and the sedation options and                         risks were discussed with the patient. All questions                         were answered and informed consent was obtained.                         Patient identification and proposed procedure were                         verified by the physician, the nurse, the                         anesthesiologist, the anesthetist and the technician                         in the pre-procedure area in the procedure room in the                         endoscopy suite. Mental Status Examination: alert and                         oriented. Airway Examination: normal oropharyngeal                         airway and neck mobility. Respiratory Examination:                         clear to auscultation. CV Examination: normal.  Prophylactic Antibiotics: The patient does not require                         prophylactic antibiotics. Prior Anticoagulants: The                         patient has taken no previous anticoagulant or                         antiplatelet agents.  ASA Grade Assessment: III - A                         patient with severe systemic disease. After reviewing                         the risks and benefits, the patient was deemed in                         satisfactory condition to undergo the procedure. The                         anesthesia plan was to use general anesthesia.                         Immediately prior to administration of medications,                         the patient was re-assessed for adequacy to receive                         sedatives. The heart rate, respiratory rate, oxygen                         saturations, blood pressure, adequacy of pulmonary                         ventilation, and response to care were monitored                         throughout the procedure. The physical status of the                         patient was re-assessed after the procedure.                        After obtaining informed consent, the colonoscope was                         passed under direct vision. Throughout the procedure,                         the patient's blood pressure, pulse, and oxygen                         saturations were monitored continuously. The                         Colonoscope was introduced through the anus and  advanced to the the rectum to examine a mass. This was                         the intended extent. The colonoscopy was extremely                         difficult due to a partially obstructing mass. The                         patient tolerated the procedure well. The quality of                         the bowel preparation was adequate. Findings:      The digital rectal exam revealed a firm, hard and fixed rectal mass       palpated 2.0 cm from the anal verge. The mass was circumferential.      An infiltrative, submucosal and ulcerated partially obstructing large       mass was found in the rectum. The mass was circumferential. The mass       measured five cm in  length. Oozing was present. Biopsies were taken with       a cold forceps for histology. Cells for cytology were obtained from       aspirate. Estimated blood loss was minimal. Impression:            - Rectal mass 2.0 cm from the anal verge.                        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                         Biopsied. Cells for cytology obtained. Recommendation:        - Return patient to hospital ward for ongoing care.                        - Clear liquid diet today.                        - Continue present medications.                        - Await pathology results. Procedure Code(s):     --- Professional ---                        [phone], 15, Colonoscopy, flexible; with biopsy, single                         or multiple Diagnosis Code(s):     --- Professional ---                        K62.89, Other specified diseases of anus and rectum                        C20, Malignant neoplasm of rectum                        K56.690, Other partial intestinal obstruction  K62.5, Hemorrhage of anus and rectum CPT copyright 2019 American Medical Association. All rights reserved. The codes documented in this report are preliminary and upon coder review may  be revised to meet current compliance requirements. Dr. Ulyess Mort [name] MD, MD 05/27/2020 12:33:00 PM This report has been signed electronically. Number of Addenda: 0 Note Initiated On: 05/27/2020 12:15 PM Total Procedure Duration: 0 hours 6 minutes 29 seconds  Estimated Blood Loss:  Estimated blood loss was minimal.      [facility]

## 2020-05-27 NOTE — Progress Notes (Signed)
Mobility Specialist - Progress Note   05/27/20 1154  Mobility  Activity Contraindicated/medical hold  Mobility performed by Mobility specialist    Pt currently OOF for colonoscopy. Will attempt session at another time as pt becomes available.    Kathee Delton Mobility Specialist 05/27/20, 11:55 AM

## 2020-05-27 NOTE — Anesthesia Procedure Notes (Signed)
Procedure Name: General with mask airway Performed by: [place], Essa Malachi, CRNA Pre-anesthesia Checklist: Patient identified, Emergency Drugs available, Suction available and Patient being monitored Patient Re-evaluated:Patient Re-evaluated prior to induction Oxygen Delivery Method: Simple face mask Induction Type: IV induction Placement Confirmation: positive ETCO2 and CO2 detector Dental Injury: Teeth and Oropharynx as per pre-operative assessment        

## 2020-05-27 NOTE — TOC Initial Note (Signed)
Transition of Care [facility]) - Initial/Assessment Note    Patient Details  Name: Anita Sullivan MRN: [id number] Date of Birth: 1965/12/20  [facility] CM/SW Contact:    Beverly Sessions, RN Phone Number: 05/27/2020, 9:58 AM  Clinical Narrative:                    Patient listed as self pay.  TOC reached out to financial counseling by email to determine if patient has been assigned to a case worker.  Awaiting response      Patient Goals and CMS Choice        Expected Discharge Plan and Services                                                Prior Living Arrangements/Services                       Activities of Daily Living Home Assistive Devices/Equipment: None ADL Screening (condition at time of admission) Patient's cognitive ability adequate to safely complete daily activities?: Yes Is the patient deaf or have difficulty hearing?: No Does the patient have difficulty seeing, even when wearing glasses/contacts?: No Does the patient have difficulty concentrating, remembering, or making decisions?: No Patient able to express need for assistance with ADLs?: Yes Does the patient have difficulty dressing or bathing?: No Independently performs ADLs?: Yes (appropriate for developmental age) Does the patient have difficulty walking or climbing stairs?: No Weakness of Legs: None Weakness of Arms/Hands: None  Permission Sought/Granted                  Emotional Assessment              Admission diagnosis:  Hydronephrosis [N13.30] Hypoalbuminemia [Y58.59] Metabolic acidosis [Y92.4] Uremia [N19] GI bleed [K92.2] Bleeding per rectum [K62.5] Obstructive uropathy [N13.9] Liver mass [R16.0] Rectal bleed [K62.5] AKI (acute kidney injury) ([place]) [N17.9] Low hemoglobin [D64.9] Gastrointestinal hemorrhage associated with anorectal source [K62.5] Acute renal failure, unspecified acute renal failure type ([place])  [N17.9] Gastrointestinal hemorrhage, unspecified gastrointestinal hemorrhage type [K92.2] Malignant neoplasm of colon, unspecified part of colon ([place]) [C18.9] Patient Active Problem List   Diagnosis Date Noted  . Rectal bleed 05/25/2020  . Bleeding per rectum 05/24/2020  . Hydronephrosis   . Metabolic acidosis   . GI bleed 05/22/2020  . Acute renal failure ([place]) 05/22/2020  . Obstructive uropathy 05/22/2020  . Colitis, acute 05/22/2020   PCP:  Volney American, PA-C Pharmacy:   [facility] [place]) [place] [address] Phone: [phone] Fax: [phone]     Social Determinants of Health (SDOH) Interventions    Readmission Risk Interventions No flowsheet data found.

## 2020-05-27 NOTE — Transfer of Care (Signed)
Immediate Anesthesia Transfer of Care Note  Patient: Anita Sullivan  Procedure(s) Performed: COLONOSCOPY WITH PROPOFOL (N/A )  Patient Location: Endoscopy Unit  Anesthesia Type:General  Level of Consciousness: awake, alert , oriented and patient cooperative  Airway & Oxygen Therapy: Patient Spontanous Breathing and Patient connected to face mask oxygen  Post-op Assessment: Report given to RN and Post -op Vital signs reviewed and stable  Post vital signs: Reviewed and stable  Last Vitals:  Vitals Value Taken Time  BP    Temp    Pulse    Resp    SpO2      Last Pain:  Vitals:   05/27/20 1108  TempSrc: Temporal  PainSc: 0-No pain         Complications: No complications documented.

## 2020-05-27 NOTE — Progress Notes (Signed)
[facility]  ROUNDING NOTE   Subjective:   Renal function continues to improve. Creatinine down to 2.9. Liver biopsy performed.  Objective:  Vital signs in last 24 hours:  Temp:  [97.7 F (36.5 C)-98.8 F (37.1 C)] 98.8 F (37.1 C) (10/26 1355) Pulse Rate:  [70-94] 93 (10/26 1355) Resp:  [14-33] 16 (10/26 1355) BP: (102-132)/(54-96) 112/67 (10/26 1355) SpO2:  [97 %-100 %] 99 % (10/26 1355) Weight:  [59 kg] 59 kg (10/26 1108)  Weight change:  Filed Weights   05/22/20 1537 05/27/20 1108  Weight: 59 kg 59 kg    Intake/Output: I/O last 3 completed shifts: In: 4455.2 [I.V.:4455.2] Out: 1575 [Urine:1575]   Intake/Output this shift:  Total I/O In: 400 [I.V.:400] Out: 175 [Urine:175]  Physical Exam: General: No acute distress  Head: Normocephalic, atraumatic. Moist oral mucosal membranes  Eyes: Anicteric  Lungs:  Clear to auscultation bilaterally  Heart: Regular rate and rhythm  Abdomen:  Soft,mild tenderness + in epigastric area  Extremities: No peripheral edema.  Neurologic: Oriented ,moving all four extremities  Skin: No lesions or rashes  Bil.Nephrostomy tubes Blood tinged, amber colored output    Basic Metabolic Panel: Recent Labs  Lab 05/23/20 1542 05/23/20 1542 05/24/20 0343 05/24/20 0343 05/25/20 0500 05/26/20 0450 05/27/20 0547  NA 138  --  137  --  139 144 138  K 4.0  --  4.1  --  3.6 3.3* 3.6  CL 107  --  108  --  109 108 103  CO2 16*  --  18*  --  18* 25 25  GLUCOSE 100*  --  115*  --  93 100* 116*  BUN 87*  --  84*  --  68* 46* 26*  CREATININE 10.74*  --  9.89*  --  7.27* 4.83* 2.99*  CALCIUM 8.3*   < > 7.8*   < > 7.8* 8.5* 8.4*  MG  --   --   --   --   --  1.9  --   PHOS  --   --  6.8*  --  5.5* 4.1 2.9   < > = values in this interval not displayed.    Liver Function Tests: Recent Labs  Lab 05/22/20 1542 05/24/20 0343 05/25/20 0500 05/26/20 0450 05/27/20 0547  AST 14*  --   --   --   --   ALT 9  --   --   --   --    ALKPHOS 62  --   --   --   --   BILITOT 0.5  --   --   --   --   PROT 7.7  --   --   --   --   ALBUMIN 3.0* 2.5* 2.4* 2.5* 2.7*   Recent Labs  Lab 05/22/20 1934  LIPASE 27   No results for input(s): AMMONIA in the last 168 hours.  CBC: Recent Labs  Lab 05/23/20 1542 05/24/20 0233 05/25/20 0422 05/26/20 0450 05/27/20 0547  WBC 9.8 9.3 9.7 9.3 9.8  HGB 6.9* 6.4* 9.1* 9.2* 9.3*  HCT 21.4* 19.4* 27.5* 27.9* 28.8*  MCV 77.0* 76.4* 79.9* 79.0* 81.6  PLT 425* 392 354 393 423*    Cardiac Enzymes: No results for input(s): CKTOTAL, CKMB, CKMBINDEX, TROPONINI in the last 168 hours.  BNP: Invalid input(s): POCBNP  CBG: No results for input(s): GLUCAP in the last 168 hours.  Microbiology: Results for orders placed or performed during the hospital encounter of 05/22/20  Respiratory Panel by RT PCR (Flu A&B, Covid) - Nasopharyngeal Swab     Status: None   Collection Time: 05/22/20  7:34 PM   Specimen: Nasopharyngeal Swab  Result Value Ref Range Status   SARS Coronavirus 2 by RT PCR NEGATIVE NEGATIVE Final    Comment: (NOTE) SARS-CoV-2 target nucleic acids are NOT DETECTED.  The SARS-CoV-2 RNA is generally detectable in upper respiratoy specimens during the acute phase of infection. The lowest concentration of SARS-CoV-2 viral copies this assay can detect is 131 copies/mL. A negative result does not preclude SARS-Cov-2 infection and should not be used as the sole basis for treatment or other patient management decisions. A negative result may occur with  improper specimen collection/handling, submission of specimen other than nasopharyngeal swab, presence of viral mutation(s) within the areas targeted by this assay, and inadequate number of viral copies (<131 copies/mL). A negative result must be combined with clinical observations, patient history, and epidemiological information. The expected result is Negative.  Fact Sheet for Patients:   PinkCheek.be  Fact Sheet for Healthcare Providers:  GravelBags.it  This test is no t yet approved or cleared by the [place] FDA and  has been authorized for detection and/or diagnosis of SARS-CoV-2 by FDA under an Emergency Use Authorization (EUA). This EUA will remain  in effect (meaning this test can be used) for the duration of the COVID-19 declaration under Section 564(b)(1) of the Act, 21 U.S.C. section 360bbb-3(b)(1), unless the authorization is terminated or revoked sooner.     Influenza A by PCR NEGATIVE NEGATIVE Final   Influenza B by PCR NEGATIVE NEGATIVE Final    Comment: (NOTE) The Xpert Xpress SARS-CoV-2/FLU/RSV assay is intended as an aid in  the diagnosis of influenza from Nasopharyngeal swab specimens and  should not be used as a sole basis for treatment. Nasal washings and  aspirates are unacceptable for Xpert Xpress SARS-CoV-2/FLU/RSV  testing.  Fact Sheet for Patients: PinkCheek.be  Fact Sheet for Healthcare Providers: GravelBags.it  This test is not yet approved or cleared by the [place] FDA and  has been authorized for detection and/or diagnosis of SARS-CoV-2 by  FDA under an Emergency Use Authorization (EUA). This EUA will remain  in effect (meaning this test can be used) for the duration of the  Covid-19 declaration under Section 564(b)(1) of the Act, 21  U.S.C. section 360bbb-3(b)(1), unless the authorization is  terminated or revoked. Performed at [facility], [address] [place],  [address]   Gastrointestinal Panel by PCR , Stool     Status: None   Collection Time: 05/24/20  1:00 AM   Specimen: Stool  Result Value Ref Range Status   Campylobacter species NOT DETECTED NOT DETECTED Final   Plesimonas shigelloides NOT DETECTED NOT DETECTED Final   Salmonella species NOT DETECTED NOT DETECTED Final    Yersinia enterocolitica NOT DETECTED NOT DETECTED Final   Vibrio species NOT DETECTED NOT DETECTED Final   Vibrio cholerae NOT DETECTED NOT DETECTED Final   Enteroaggregative E coli (EAEC) NOT DETECTED NOT DETECTED Final   Enteropathogenic E coli (EPEC) NOT DETECTED NOT DETECTED Final   Enterotoxigenic E coli (ETEC) NOT DETECTED NOT DETECTED Final   Shiga like toxin producing E coli (STEC) NOT DETECTED NOT DETECTED Final   Shigella/Enteroinvasive E coli (EIEC) NOT DETECTED NOT DETECTED Final   Cryptosporidium NOT DETECTED NOT DETECTED Final   Cyclospora cayetanensis NOT DETECTED NOT DETECTED Final   Entamoeba histolytica NOT DETECTED NOT DETECTED Final   Giardia  lamblia NOT DETECTED NOT DETECTED Final   Adenovirus F40/41 NOT DETECTED NOT DETECTED Final   Astrovirus NOT DETECTED NOT DETECTED Final   Norovirus GI/GII NOT DETECTED NOT DETECTED Final   Rotavirus A NOT DETECTED NOT DETECTED Final   Sapovirus (I, II, IV, and V) NOT DETECTED NOT DETECTED Final    Comment: Performed at [facility], [address], [place], [place] [address]  C Difficile Quick Screen w PCR reflex     Status: None   Collection Time: 05/24/20  1:00 AM   Specimen: Stool  Result Value Ref Range Status   C Diff antigen NEGATIVE NEGATIVE Final   C Diff toxin NEGATIVE NEGATIVE Final   C Diff interpretation No C. difficile detected.  Final    Comment: Performed at [facility], [address] [place], [place] [address]  Urine Culture     Status: None   Collection Time: 05/25/20  4:31 AM   Specimen: Urine, Catheterized  Result Value Ref Range Status   Specimen Description   Final    URINE, CATHETERIZED Performed at [facility], [address] [place], [place] [address]    Special Requests   Final    NONE Performed at [facility], [address] [place], [place] [address]    Culture   Final    NO GROWTH Performed at [facility], [facility] [address],  [place], [place] [address]    Report Status 05/26/2020 FINAL  Final    Coagulation Studies: Recent Labs    05/26/20 0946  LABPROT 13.5  INR 1.1    Urinalysis: No results for input(s): COLORURINE, LABSPEC, PHURINE, GLUCOSEU, HGBUR, BILIRUBINUR, KETONESUR, PROTEINUR, UROBILINOGEN, NITRITE, LEUKOCYTESUR in the last 72 hours.  Invalid input(s): APPERANCEUR    Imaging: US BIOPSY (LIVER)  Result Date: 05/26/2020 INDICATION: Rectal mass with associated liver masses. EXAM: ULTRASOUND GUIDED CORE BIOPSY OF LIVER MEDICATIONS: None. ANESTHESIA/SEDATION: Fentanyl 1.0 mcg IV; Versed 50 mg IV Moderate Sedation Time:  15 minutes. The patient was continuously monitored during the procedure by the interventional radiology nurse under my direct supervision. PROCEDURE: The procedure, risks, benefits, and alternatives were explained to the patient. Questions regarding the procedure were encouraged and answered. The patient understands and consents to the procedure. A time-out was performed prior to initiating the procedure. Ultrasound was performed to localize liver lesions. The abdominal wall was prepped with chlorhexidine in a sterile fashion, and a sterile drape was applied covering the operative field. A sterile gown and sterile gloves were used for the procedure. Local anesthesia was provided with 1% Lidocaine. Under ultrasound guidance, a 81 gauge trocar needle was advanced into the liver at the level of a lesion within the right lobe. Three separate coaxial 18 gauge core biopsy samples were obtained with an 18 gauge device. Material was submitted in formalin. Gel-Foam pledgets were advanced through the outer needle as it was retracted and removed. Additional ultrasound was performed. COMPLICATIONS: None immediate. FINDINGS: The largest mass in the liver as noted by CT is within the inferior aspect of the right lobe. By ultrasound this lesion measures approximately 6.5 x 5.1 x 6.1 cm. Solid tissue was obtained.  IMPRESSION: Ultrasound-guided core biopsy performed at the level of a 6.5 cm mass within the inferior aspect of the right lobe of the liver. Electronically Signed   By: Aletta Edouard M.D.   On: 05/26/2020 15:27     Medications:   . sodium chloride    . lactated ringers 100  mL/hr at 05/27/20 0421   . calcium acetate  667 mg Oral TID WC  . pantoprazole (PROTONIX) IV  40 mg Intravenous Q12H  . potassium chloride  20 mEq Oral Once  . sodium bicarbonate  650 mg Oral TID   ondansetron **OR** ondansetron (ZOFRAN) IV  Assessment/ Plan:  [name] is a 54 y.o.  female presented to the emergency room with the chief complaint of rectal bleeding for last 3 months.CT scan of the abdomen and pelvis that demonstrated marked severe sigmoid colitis numerous, numerous areas of soft tissue nodularity within the mesentery suggestive of an underlying neoplastic process that cannot be excluded heterogeneous liver masses noted suspicious for a neoplasm. Moderately severe Faera aortic and aortocaval lymphadenopathy noted marked severe bilateral hydronephrosis and hydroureter representing partial renal obstruction.  Patient was consulted for nephrology for acute kidney injury.  She has bilateral nephrostomy tubes, placed during this admission.  # Acute kidney injury secondary to obstruction Renal function continues to improve.  Creatinine down to 2.9.  Continue to monitor urine output from nephrostomies.  No indication for dialysis.  #Anemia secondary to chronic disease Hemoglobin appears to be stable at 9.3.  Continue to monitor CBC.  #Metabolic acidosis Significantly improved with relief of obstruction.  Serum bicarbonate 25.   LOS: 5 Anita Sullivan 10/26/20212:09 PM

## 2020-05-27 NOTE — Progress Notes (Signed)
Order received from Dr Reesa Chew to discontinue IVF

## 2020-05-27 NOTE — Progress Notes (Signed)
Patient returned from Endo.  

## 2020-05-27 NOTE — Progress Notes (Signed)
PROGRESS NOTE    Anita Sullivan  EPP:[id number] DOB: 17-Dec-1965 DOA: 05/22/2020 PCP: Volney American, PA-C   Brief Narrative:  Anita Sullivan is a 54 y.o. female admitted last night with rectal bleeding.  Ongoing for 3 months on and off.The emergency room her hemoglobin was noted to be 6.4 g with an MCV of 75.8. Creatinine of 9.82 and CO2 of 16. Large amount of blood and leukocytes in the urine. CT scan of the abdomen and pelvis that demonstrated marked severe sigmoid colitis numerous, numerous areas of soft tissue nodularity within the mesentery suggestive of an underlying neoplastic process that cannot be excluded heterogeneous liver masses noted suspicious for a neoplasm.  Moderately severe Faera aortic and aortocaval lymphadenopathy noted marked severe bilateral hydronephrosis and hydroureter representing partial renal obstruction. High concern for extensive malignancy. Patient has quite a bit of unintentional weight loss recently. No family history of malignancy. Multiple specialities were consulted this morning which include GI, urology, nephrology and oncology. She underwent emergent bilateral nephrostomy tube placement by IR and draining well now.   Subjective: Per patient she had cough worse at night since hospitalization as she was taking colon prep and was up the whole night. Waiting for colonoscopy later today. She was asking to take a shower-told nursing staff that she can and they might have to change her dressings around nephrostomy tubes after.  Assessment & Plan:   Principal Problem:   GI bleed Active Problems:   Acute renal failure (HCC)   Obstructive uropathy   Colitis, acute   Bleeding per rectum   Rectal bleed  Lower GI bleed. Symptoms and imaging is concerning for extensive malignancy. Patient had unintentional weight loss and intermittent rectal bleeding going on for about a year now. Patient received total of 3 units of PRBC.  Also received IV  iron.  Hemoglobin stable at 9.3 today. Oncology is following-appreciate their recommendations.  Mildly elevated CEA with normal other tumor markers.  Anemia panel with anemia of chronic disease, no significant iron deficiency. Had liver biopsy yesterday-pending pathology results Going for colonoscopy on Today-which shows a large rectal mass 2 cm from anal region, partially obstructing. -Surgery was also consulted-will appreciate their recommendations. -Monitor CBC. -Transfuse if below 8.  Acute kidney injury. Most likely secondary to obstructive uropathy as evident on CT abdomen. Urology was also consulted and according to their advice IR was consulted for bilateral emergent nephrostomy tube placement which was done yesterday. Patient draining well from nephrostomy tubes, both bags with some blood tinted urine one more than the other. Urology signed off -will need replacement of nephrostomy tubes in 4 to 6 weeks by IR. Nephrology consult was also placed-will appreciate their recommendations. Creatinine started trending down, it was 2.99 with BUN of 26 today.  Bicarb at 25.   -Encourage p.o. hydration. -Continue with oral bicarb. -She was started on calcium binders by nephrology yesterday. -Monitor renal function. -Avoid nephrotoxins.  Anion gap metabolic acidosis.  Anion gap resolved.  Lactic acid within normal limit. Most likely secondary to severe acute AKI and GI bleed.  Bicarb started improving and normalized. -Continue with p.o. bicarb.  -Continue to monitor.  Objective: Vitals:   05/27/20 1309 05/27/20 1319 05/27/20 1355 05/27/20 1547  BP: 117/68 118/60 112/67 111/66  Pulse: 88 88 93 85  Resp: 19 (!) 33 16 15  Temp:   98.8 F (37.1 C) 98 F (36.7 C)  TempSrc:    Oral  SpO2: 100% 100% 99% 99%  Weight:  Height:        Intake/Output Summary (Last 24 hours) at 05/27/2020 1616 Last data filed at 05/27/2020 1500 Gross per 24 hour  Intake 2762.08 ml  Output 350 ml  Net  2412.08 ml   Filed Weights   05/22/20 1537 05/27/20 1108  Weight: 59 kg 59 kg    Examination:  General.  Well-developed lady, in no acute distress. Pulmonary.  Lungs clear bilaterally, normal respiratory effort. CV.  Regular rate and rhythm, no JVD, rub or murmur. Abdomen.  Soft, nontender, nondistended, BS positive.  Bilateral nephrostomy tube in place with clear urine today. CNS.  Alert and oriented x3.  No focal neurologic deficit. Extremities.  No edema, no cyanosis, pulses intact and symmetrical. Psychiatry.  Judgment and insight appears normal.  DVT prophylaxis: SCDs Code Status: Full Family Communication: Discussed with patient and updated the daughter. Disposition Plan:  Status is: Inpatient  Remains inpatient appropriate because:Inpatient level of care appropriate due to severity of illness   Dispo: The patient is from: Home              Anticipated d/c is to: Home              Anticipated d/c date is: 1-2 days.              Patient currently is not medically stable to d/c.   Consultants:   GI  IR  Oncology  Nephrology  Surgery   Procedures:  Antimicrobials:   Data Reviewed: I have personally reviewed following labs and imaging studies  CBC: Recent Labs  Lab 05/23/20 1542 05/24/20 0233 05/25/20 0422 05/26/20 0450 05/27/20 0547  WBC 9.8 9.3 9.7 9.3 9.8  HGB 6.9* 6.4* 9.1* 9.2* 9.3*  HCT 21.4* 19.4* 27.5* 27.9* 28.8*  MCV 77.0* 76.4* 79.9* 79.0* 81.6  PLT 425* 392 354 393 161*   Basic Metabolic Panel: Recent Labs  Lab 05/23/20 1542 05/24/20 0343 05/25/20 0500 05/26/20 0450 05/27/20 0547  NA 138 137 139 144 138  K 4.0 4.1 3.6 3.3* 3.6  CL 107 108 109 108 103  CO2 16* 18* 18* 25 25  GLUCOSE 100* 115* 93 100* 116*  BUN 87* 84* 68* 46* 26*  CREATININE 10.74* 9.89* 7.27* 4.83* 2.99*  CALCIUM 8.3* 7.8* 7.8* 8.5* 8.4*  MG  --   --   --  1.9  --   PHOS  --  6.8* 5.5* 4.1 2.9   GFR: Estimated Creatinine Clearance: 17.7 mL/min (A) (by C-G  formula based on SCr of 2.99 mg/dL (H)). Liver Function Tests: Recent Labs  Lab 05/22/20 1542 05/24/20 0343 05/25/20 0500 05/26/20 0450 05/27/20 0547  AST 14*  --   --   --   --   ALT 9  --   --   --   --   ALKPHOS 62  --   --   --   --   BILITOT 0.5  --   --   --   --   PROT 7.7  --   --   --   --   ALBUMIN 3.0* 2.5* 2.4* 2.5* 2.7*   Recent Labs  Lab 05/22/20 1934  LIPASE 27   No results for input(s): AMMONIA in the last 168 hours. Coagulation Profile: Recent Labs  Lab 05/22/20 2313 05/26/20 0946  INR 1.2 1.1   Cardiac Enzymes: No results for input(s): CKTOTAL, CKMB, CKMBINDEX, TROPONINI in the last 168 hours. BNP (last 3 results) No results for input(s): PROBNP in the  last 8760 hours. HbA1C: No results for input(s): HGBA1C in the last 72 hours. CBG: No results for input(s): GLUCAP in the last 168 hours. Lipid Profile: No results for input(s): CHOL, HDL, LDLCALC, TRIG, CHOLHDL, LDLDIRECT in the last 72 hours. Thyroid Function Tests: No results for input(s): TSH, T4TOTAL, FREET4, T3FREE, THYROIDAB in the last 72 hours. Anemia Panel: No results for input(s): VITAMINB12, FOLATE, FERRITIN, TIBC, IRON, RETICCTPCT in the last 72 hours. Sepsis Labs: Recent Labs  Lab 05/22/20 1934 05/22/20 2312  LATICACIDVEN 1.0 0.6    Recent Results (from the past 240 hour(s))  Respiratory Panel by RT PCR (Flu A&B, Covid) - Nasopharyngeal Swab     Status: None   Collection Time: 05/22/20  7:34 PM   Specimen: Nasopharyngeal Swab  Result Value Ref Range Status   SARS Coronavirus 2 by RT PCR NEGATIVE NEGATIVE Final    Comment: (NOTE) SARS-CoV-2 target nucleic acids are NOT DETECTED.  The SARS-CoV-2 RNA is generally detectable in upper respiratoy specimens during the acute phase of infection. The lowest concentration of SARS-CoV-2 viral copies this assay can detect is 131 copies/mL. A negative result does not preclude SARS-Cov-2 infection and should not be used as the sole basis  for treatment or other patient management decisions. A negative result may occur with  improper specimen collection/handling, submission of specimen other than nasopharyngeal swab, presence of viral mutation(s) within the areas targeted by this assay, and inadequate number of viral copies (<131 copies/mL). A negative result must be combined with clinical observations, patient history, and epidemiological information. The expected result is Negative.  Fact Sheet for Patients:  PinkCheek.be  Fact Sheet for Healthcare Providers:  GravelBags.it  This test is no t yet approved or cleared by the [place] FDA and  has been authorized for detection and/or diagnosis of SARS-CoV-2 by FDA under an Emergency Use Authorization (EUA). This EUA will remain  in effect (meaning this test can be used) for the duration of the COVID-19 declaration under Section 564(b)(1) of the Act, 21 U.S.C. section 360bbb-3(b)(1), unless the authorization is terminated or revoked sooner.     Influenza A by PCR NEGATIVE NEGATIVE Final   Influenza B by PCR NEGATIVE NEGATIVE Final    Comment: (NOTE) The Xpert Xpress SARS-CoV-2/FLU/RSV assay is intended as an aid in  the diagnosis of influenza from Nasopharyngeal swab specimens and  should not be used as a sole basis for treatment. Nasal washings and  aspirates are unacceptable for Xpert Xpress SARS-CoV-2/FLU/RSV  testing.  Fact Sheet for Patients: PinkCheek.be  Fact Sheet for Healthcare Providers: GravelBags.it  This test is not yet approved or cleared by the [place] FDA and  has been authorized for detection and/or diagnosis of SARS-CoV-2 by  FDA under an Emergency Use Authorization (EUA). This EUA will remain  in effect (meaning this test can be used) for the duration of the  Covid-19 declaration under Section 564(b)(1) of the Act, 21    U.S.C. section 360bbb-3(b)(1), unless the authorization is  terminated or revoked. Performed at [facility], [address] [place], [place] [address]   Gastrointestinal Panel by PCR , Stool     Status: None   Collection Time: 05/24/20  1:00 AM   Specimen: Stool  Result Value Ref Range Status   Campylobacter species NOT DETECTED NOT DETECTED Final   Plesimonas shigelloides NOT DETECTED NOT DETECTED Final   Salmonella species NOT DETECTED NOT DETECTED Final   Yersinia enterocolitica NOT DETECTED NOT DETECTED Final   Vibrio  species NOT DETECTED NOT DETECTED Final   Vibrio cholerae NOT DETECTED NOT DETECTED Final   Enteroaggregative E coli (EAEC) NOT DETECTED NOT DETECTED Final   Enteropathogenic E coli (EPEC) NOT DETECTED NOT DETECTED Final   Enterotoxigenic E coli (ETEC) NOT DETECTED NOT DETECTED Final   Shiga like toxin producing E coli (STEC) NOT DETECTED NOT DETECTED Final   Shigella/Enteroinvasive E coli (EIEC) NOT DETECTED NOT DETECTED Final   Cryptosporidium NOT DETECTED NOT DETECTED Final   Cyclospora cayetanensis NOT DETECTED NOT DETECTED Final   Entamoeba histolytica NOT DETECTED NOT DETECTED Final   Giardia lamblia NOT DETECTED NOT DETECTED Final   Adenovirus F40/41 NOT DETECTED NOT DETECTED Final   Astrovirus NOT DETECTED NOT DETECTED Final   Norovirus GI/GII NOT DETECTED NOT DETECTED Final   Rotavirus A NOT DETECTED NOT DETECTED Final   Sapovirus (I, II, IV, and V) NOT DETECTED NOT DETECTED Final    Comment: Performed at [facility], [address] [place], [place] [address]  C Difficile Quick Screen w PCR reflex     Status: None   Collection Time: 05/24/20  1:00 AM   Specimen: Stool  Result Value Ref Range Status   C Diff antigen NEGATIVE NEGATIVE Final   C Diff toxin NEGATIVE NEGATIVE Final   C Diff interpretation No C. difficile detected.  Final    Comment: Performed at [facility], [address] [place], [place] [address]   Urine Culture     Status: None   Collection Time: 05/25/20  4:31 AM   Specimen: Urine, Catheterized  Result Value Ref Range Status   Specimen Description   Final    URINE, CATHETERIZED Performed at [facility], [address], [place], [place] [address]    Special Requests   Final    NONE Performed at [facility], [address], [place], [place] [address]    Culture   Final    NO GROWTH Performed at [facility], [place] [address] [place], [place] [address]    Report Status 05/26/2020 FINAL  Final     Radiology Studies: US BIOPSY (LIVER)  Result Date: 05/26/2020 INDICATION: Rectal mass with associated liver masses. EXAM: ULTRASOUND GUIDED CORE BIOPSY OF LIVER MEDICATIONS: None. ANESTHESIA/SEDATION: Fentanyl 1.0 mcg IV; Versed 50 mg IV Moderate Sedation Time:  15 minutes. The patient was continuously monitored during the procedure by the interventional radiology nurse under my direct supervision. PROCEDURE: The procedure, risks, benefits, and alternatives were explained to the patient. Questions regarding the procedure were encouraged and answered. The patient understands and consents to the procedure. A time-out was performed prior to initiating the procedure. Ultrasound was performed to localize liver lesions. The abdominal wall was prepped with chlorhexidine in a sterile fashion, and a sterile drape was applied covering the operative field. A sterile gown and sterile gloves were used for the procedure. Local anesthesia was provided with 1% Lidocaine. Under ultrasound guidance, a 12 gauge trocar needle was advanced into the liver at the level of a lesion within the right lobe. Three separate coaxial 18 gauge core biopsy samples were obtained with an 18 gauge device. Material was submitted in formalin. Gel-Foam pledgets were advanced through the outer needle as it was retracted and removed. Additional ultrasound was performed. COMPLICATIONS: None immediate.  FINDINGS: The largest mass in the liver as noted by CT is within the inferior aspect of the right lobe. By ultrasound this lesion measures approximately 6.5 x 5.1 x 6.1 cm. Solid  tissue was obtained. IMPRESSION: Ultrasound-guided core biopsy performed at the level of a 6.5 cm mass within the inferior aspect of the right lobe of the liver. Electronically Signed   By: Aletta Edouard M.D.   On: 05/26/2020 15:27    Scheduled Meds:  calcium acetate  667 mg Oral TID WC   pantoprazole (PROTONIX) IV  40 mg Intravenous Q12H   potassium chloride  20 mEq Oral Once   sodium bicarbonate  650 mg Oral TID   Continuous Infusions:  sodium chloride       LOS: 5 days   Time spent: 30 minutes. More than 50% of the time was spent with direct patient care and coordinating with other specialities.  Lorella Nimrod, MD [facility]  If 7PM-7AM, please contact night-coverage [email or web address]  05/27/2020, 4:16 PM   This record has been created using Systems analyst. Errors have been sought and corrected,but may not always be located. Such creation errors do not reflect on the standard of care.

## 2020-05-27 NOTE — Anesthesia Preprocedure Evaluation (Addendum)
Anesthesia Evaluation  Patient identified by MRN, date of birth, ID band Patient awake    Reviewed: Allergy & Precautions, NPO status , Patient's Chart, lab work & pertinent test results  Airway Mallampati: III       Dental   Pulmonary neg pulmonary ROS,           Cardiovascular negative cardio ROS       Neuro/Psych negative neurological ROS  negative psych ROS   GI/Hepatic Neg liver ROS,   Endo/Other  negative endocrine ROS  Renal/GU ARFRenal disease     Musculoskeletal negative musculoskeletal ROS (+)   Abdominal   Peds negative pediatric ROS (+)  Hematology  (+) anemia ,   Anesthesia Other Findings Rectal bleeding  Reproductive/Obstetrics                             Anesthesia Physical Anesthesia Plan  ASA: III  Anesthesia Plan: General   Post-op Pain Management:    Induction: Intravenous  PONV Risk Score and Plan: Propofol infusion  Airway Management Planned: Nasal Cannula  Additional Equipment:   Intra-op Plan:   Post-operative Plan:   Informed Consent: I have reviewed the patients History and Physical, chart, labs and discussed the procedure including the risks, benefits and alternatives for the proposed anesthesia with the patient or authorized representative who has indicated his/her understanding and acceptance.     Dental advisory given  Plan Discussed with: CRNA and Surgeon  Anesthesia Plan Comments:         Anesthesia Quick Evaluation

## 2020-05-28 ENCOUNTER — Encounter: Payer: Self-pay | Admitting: Certified Registered Nurse Anesthetist

## 2020-05-28 ENCOUNTER — Encounter: Payer: Self-pay | Admitting: Gastroenterology

## 2020-05-28 ENCOUNTER — Other Ambulatory Visit: Payer: Self-pay | Admitting: Anatomic Pathology & Clinical Pathology

## 2020-05-28 ENCOUNTER — Telehealth: Payer: Self-pay | Admitting: Oncology

## 2020-05-28 ENCOUNTER — Encounter
Admission: EM | Disposition: A | Discharge status: Home / Self Care | Payer: Self-pay | Source: Home / Self Care | Attending: Internal Medicine

## 2020-05-28 DIAGNOSIS — C19 Malignant neoplasm of rectosigmoid junction: Secondary | ICD-10-CM

## 2020-05-28 DIAGNOSIS — K6289 Other specified diseases of anus and rectum: Secondary | ICD-10-CM

## 2020-05-28 DIAGNOSIS — N179 Acute kidney failure, unspecified: Secondary | ICD-10-CM

## 2020-05-28 DIAGNOSIS — Z515 Encounter for palliative care: Secondary | ICD-10-CM

## 2020-05-28 HISTORY — PX: FLEXIBLE SIGMOIDOSCOPY: SHX5431

## 2020-05-28 LAB — CBC
HCT: 26.7 % — ABNORMAL LOW (ref 36.0–46.0)
Hemoglobin: 8.7 g/dL — ABNORMAL LOW (ref 12.0–15.0)
MCH: 27 pg (ref 26.0–34.0)
MCHC: 32.6 g/dL (ref 30.0–36.0)
MCV: 82.9 fL (ref 80.0–100.0)
Platelets: 341 10*3/uL (ref 150–400)
RBC: 3.22 MIL/uL — ABNORMAL LOW (ref 3.87–5.11)
RDW: 17.2 % — ABNORMAL HIGH (ref 11.5–15.5)
WBC: 8.1 10*3/uL (ref 4.0–10.5)
nRBC: 0 % (ref 0.0–0.2)

## 2020-05-28 LAB — RENAL FUNCTION PANEL
Albumin: 2.5 g/dL — ABNORMAL LOW (ref 3.5–5.0)
Anion gap: 9 (ref 5–15)
BUN: 18 mg/dL (ref 6–20)
CO2: 28 mmol/L (ref 22–32)
Calcium: 8.3 mg/dL — ABNORMAL LOW (ref 8.9–10.3)
Chloride: 102 mmol/L (ref 98–111)
Creatinine, Ser: 2.19 mg/dL — ABNORMAL HIGH (ref 0.44–1.00)
GFR, Estimated: 26 mL/min — ABNORMAL LOW (ref >60)
Glucose, Bld: 105 mg/dL — ABNORMAL HIGH (ref 70–99)
Phosphorus: 2.9 mg/dL (ref 2.5–4.6)
Potassium: 3.3 mmol/L — ABNORMAL LOW (ref 3.5–5.1)
Sodium: 139 mmol/L (ref 135–145)

## 2020-05-28 LAB — SURGICAL PATHOLOGY

## 2020-05-28 SURGERY — SIGMOIDOSCOPY, FLEXIBLE

## 2020-05-28 MED ORDER — CEFAZOLIN SODIUM-DEXTROSE 2-4 GM/100ML-% IV SOLN
2.0000 g | INTRAVENOUS | Status: DC
  Filled 2020-05-28: qty 100

## 2020-05-28 MED ORDER — POTASSIUM CHLORIDE CRYS ER 20 MEQ PO TBCR
40.0000 meq | EXTENDED_RELEASE_TABLET | Freq: Once | ORAL | Status: DC
  Filled 2020-05-28: qty 2

## 2020-05-28 MED ORDER — POTASSIUM CHLORIDE 10 MEQ/100ML IV SOLN
10.0000 meq | INTRAVENOUS | Status: AC
  Administered 2020-05-28 (×3): 10 meq via INTRAVENOUS
  Filled 2020-05-28 (×3): qty 100

## 2020-05-28 MED ORDER — SODIUM CHLORIDE 0.9 % IV SOLN: INTRAVENOUS | Status: DC

## 2020-05-28 MED ORDER — MIDAZOLAM HCL 5 MG/5ML IJ SOLN
INTRAMUSCULAR | Status: DC | PRN
  Administered 2020-05-28 (×2): 2 mg via INTRAVENOUS

## 2020-05-28 MED ORDER — PROPOFOL 10 MG/ML IV BOLUS
INTRAVENOUS | Status: AC
  Filled 2020-05-28: qty 20

## 2020-05-28 MED ORDER — CEFAZOLIN SODIUM-DEXTROSE 2-4 GM/100ML-% IV SOLN
2.0000 g | INTRAVENOUS | Status: AC
  Administered 2020-05-29: 2 g via INTRAVENOUS
  Filled 2020-05-28: qty 100

## 2020-05-28 MED ORDER — FENTANYL CITRATE (PF) 100 MCG/2ML IJ SOLN
INTRAMUSCULAR | Status: AC
  Filled 2020-05-28: qty 2

## 2020-05-28 MED ORDER — MIDAZOLAM HCL 5 MG/5ML IJ SOLN
INTRAMUSCULAR | Status: AC
  Filled 2020-05-28: qty 10

## 2020-05-28 MED ORDER — FENTANYL CITRATE (PF) 100 MCG/2ML IJ SOLN
INTRAMUSCULAR | Status: DC | PRN
  Administered 2020-05-28 (×2): 50 ug via INTRAVENOUS

## 2020-05-28 NOTE — Progress Notes (Signed)
Anita Sullivan  [phone] [phone]  ID: Anita Sullivan OB: [birth year]  MR#: [id number]  GEX#:[id number]  Patient Care Team: Volney American, PA-C as PCP - General (Family Medicine)  CHIEF COMPLAINT: Stage IV colorectal cancer  INTERVAL HISTORY: Patient feels improved since admission, but has noted some continued rectal bleeding and hemoglobin has trended down to 8.7. She otherwise feels well and offers no specific complaints.  REVIEW OF SYSTEMS:   Review of Systems  Constitutional: Negative.  Negative for fever, malaise/fatigue and weight loss.  Respiratory: Negative.  Negative for cough, hemoptysis and shortness of breath.   Cardiovascular: Negative.  Negative for chest pain and leg swelling.  Gastrointestinal: Positive for blood in stool. Negative for abdominal pain.  Genitourinary: Negative.  Negative for frequency and hematuria.  Musculoskeletal: Negative.  Negative for back pain.  Skin: Negative.  Negative for rash.  Neurological: Negative.  Negative for dizziness, focal weakness, weakness and headaches.  Psychiatric/Behavioral: Negative.  The patient is not nervous/anxious.     As per HPI. Otherwise, a complete review of systems is negative.  PAST MEDICAL HISTORY: History reviewed. No pertinent past medical history.  PAST SURGICAL HISTORY: Past Surgical History:  Procedure Laterality Date  . IR NEPHROSTOMY PLACEMENT LEFT  05/23/2020  . IR NEPHROSTOMY PLACEMENT RIGHT  05/23/2020    FAMILY HISTORY: Family History  Problem Relation Age of Onset  . Other Mother        Corticobasal degeneration  . Cancer Mother        Uterine  . Diabetes Maternal Grandmother   . Stroke Maternal Grandfather   . Stroke Paternal Grandfather   . Heart disease Father     ADVANCED DIRECTIVES (Y/N):  @ADVDIR @  HEALTH MAINTENANCE: Social History   Tobacco Use  . Smoking status: Never Smoker  . Smokeless tobacco: Never Used  Vaping  Use  . Vaping Use: Never used  Substance Use Topics  . Alcohol use: Never  . Drug use: Never     Colonoscopy:  PAP:  Bone density:  Lipid panel:  No Known Allergies  Current Facility-Administered Medications  Medication Dose Route Frequency Provider Last Rate Last Admin  . 0.9 %  sodium chloride infusion   Intravenous Continuous Vanga, Tally Due, MD      . calcium acetate (PHOSLO) capsule 667 mg  667 mg Oral TID WC Bhutani, Manpreet S, MD   667 mg at 05/27/20 1630  . ondansetron (ZOFRAN) tablet 4 mg  4 mg Oral Q6H PRN Elwyn Reach, MD       Or  . ondansetron (ZOFRAN) injection 4 mg  4 mg Intravenous Q6H PRN Elwyn Reach, MD   4 mg at 05/26/20 1221  . pantoprazole (PROTONIX) injection 40 mg  40 mg Intravenous Q12H Elwyn Reach, MD   40 mg at 05/27/20 2115  . potassium chloride 10 mEq in 100 mL IVPB  10 mEq Intravenous Q1 Hr x 4 Swayze, Ava, DO      . potassium chloride SA (KLOR-CON) CR tablet 20 mEq  20 mEq Oral Once Lorella Nimrod, MD      . sodium bicarbonate tablet 650 mg  650 mg Oral TID Ulice Bold S, MD   650 mg at 05/27/20 2115    OBJECTIVE: Vitals:   05/27/20 2310 05/28/20 0445  BP: (!) 88/47 124/68  Pulse: 95 80  Resp: 18 19  Temp:  98.2 F (36.8 C)  SpO2: 98% 98%     Body mass index  is 25.4 kg/m.    ECOG FS:0 - Asymptomatic  General: Well-developed, well-nourished, no acute distress. Eyes: Pink conjunctiva, anicteric sclera. HEENT: Normocephalic, moist mucous membranes. Lungs: No audible wheezing or coughing. Heart: Regular rate and rhythm. Abdomen: Soft, nontender, no obvious distention. Musculoskeletal: No edema, cyanosis, or clubbing. Neuro: Alert, answering all questions appropriately. Cranial nerves grossly intact. Skin: No rashes or petechiae noted. Psych: Normal affect.  LAB RESULTS:  Lab Results  Component Value Date   NA 139 05/28/2020   K 3.3 (L) 05/28/2020   CL 102 05/28/2020   CO2 28 05/28/2020   GLUCOSE 105 (H)  05/28/2020   BUN 18 05/28/2020   CREATININE 2.19 (H) 05/28/2020   CALCIUM 8.3 (L) 05/28/2020   PROT 7.7 05/22/2020   ALBUMIN 2.5 (L) 05/28/2020   AST 14 (L) 05/22/2020   ALT 9 05/22/2020   ALKPHOS 62 05/22/2020   BILITOT 0.5 05/22/2020   GFRNONAA 26 (L) 05/28/2020    Lab Results  Component Value Date   WBC 8.1 05/28/2020   NEUTROABS 5.2 09/04/2018   HGB 8.7 (L) 05/28/2020   HCT 26.7 (L) 05/28/2020   MCV 82.9 05/28/2020   PLT 341 05/28/2020     STUDIES: CT ABDOMEN PELVIS WO CONTRAST  Result Date: 05/22/2020 CLINICAL DATA:  Intermittent rectal bleeding. EXAM: CT ABDOMEN AND PELVIS WITHOUT CONTRAST TECHNIQUE: Multidetector CT imaging of the abdomen and pelvis was performed following the standard protocol without IV contrast. COMPARISON:  None. FINDINGS: Lower chest: No acute abnormality. Hepatobiliary: A 2.9 cm x 3.1 cm ill-defined area of heterogeneous low attenuation is seen within the lateral aspect of the liver dome (axial CT image 17, CT series number 2). An ill-defined, slightly exophytic 6.9 cm x 5.0 cm area of heterogeneous mildly decreased attenuation is seen within the posterior aspect of the right lobe of the liver. An ill-defined central area of low attenuation is noted within this region. No gallstones, gallbladder wall thickening, or biliary dilatation. Pancreas: Unremarkable. No pancreatic ductal dilatation or surrounding inflammatory changes. Spleen: Normal in size without focal abnormality. Adrenals/Urinary Tract: Adrenal glands are unremarkable. Kidneys are normal in size, without focal lesions. Marked severity bilateral hydronephrosis and hydroureter is seen. The urinary bladder is partially contracted and subsequently limited in evaluation. Stomach/Bowel: Stomach is within normal limits. Appendix appears normal. No evidence of bowel dilatation. There is marked severity thickening of the mid and distal sigmoid colon associated pericolonic inflammatory fat stranding is  noted. Vascular/Lymphatic: No significant vascular findings are present. There is moderate severity para-aortic and aortocaval lymphadenopathy. The largest lymph node measures approximately 1.7 cm x 1.7 cm along the para-aortic region at the level of the kidney. It should be noted that this area is limited in evaluation in the absence of intravenous contrast. A 2.8 cm x 2.4 cm heterogeneous soft tissue mass is seen along the posterior aspect of the distal left iliac vessels (axial CT images 68 through 74, CT series number 2). Numerous subcentimeter soft tissue nodules are seen within the mesentery of the pelvis, along the midline (axial CT images 58 through 66, CT series number 2). Numerous additional small soft tissue nodules are seen surrounding the distal sigmoid colon and rectosigmoid junction. Reproductive: The uterus is mildly enlarged. The uterine fundus is positioned to the left of midline within the anterior aspect of the pelvis. Other: No abdominal wall hernia or abnormality. A small amount of abdominal and pelvic fluid is noted. Musculoskeletal: No acute or significant osseous findings. IMPRESSION: 1. Marked severity sigmoid colitis.  Due to the numerous areas of soft tissue nodularity within the mesentery in this region, an underlying neoplastic process cannot be excluded. Correlation with colonoscopy is recommended. 2. Heterogeneous liver masses, as described above, suspicious for an underlying neoplasm. MRI correlation is recommended. 3. Moderate severity para-aortic and aortocaval lymphadenopathy with an additional enlarged, irregular appearing lymph node seen along the posterior aspect of the distal left iliac vessels. This is also worrisome for sequelae associated with an underlying neoplasm. 4. Marked severity bilateral hydronephrosis and hydroureter which likely represents partial renal obstruction secondary to the inflammatory and suspected neoplastic process seen within the pelvis. Electronically  Signed   By: Virgina Norfolk M.D.   On: 05/22/2020 20:26   CT CHEST WO CONTRAST  Result Date: 05/27/2020 CLINICAL DATA:  New diagnosis of rectal cancer. EXAM: CT CHEST WITHOUT CONTRAST TECHNIQUE: Multidetector CT imaging of the chest was performed following the standard protocol without IV contrast. COMPARISON:  Abdominal CT 05/22/2020 FINDINGS: Cardiovascular: Normal aortic caliber. Normal heart size, without pericardial effusion. Mediastinum/Nodes: No supraclavicular adenopathy. No mediastinal or definite hilar adenopathy, given limitations of unenhanced CT. Lungs/Pleura: No pleural fluid. Left base scarring or subsegmental atelectasis. Nonspecific 2 mm right upper lobe pulmonary nodule on 29/3. Calcified granuloma of 2 mm in the right lower lobe on 85/3. Upper Abdomen: Subtle hypoattenuating hepatic dome lesion again identified. The dominant right hepatic lobe mass is excluded. Normal imaged portions of the spleen, stomach, pancreas, adrenal glands, right kidney. Musculoskeletal: Presumed bone island within the sternal body. IMPRESSION: 1.  No acute process or evidence of metastatic disease in the chest. 2. Nonspecific tiny right upper lobe pulmonary nodule. No thoracic adenopathy. 3. Liver masses, as before. Electronically Signed   By: Abigail Miyamoto M.D.   On: 05/27/2020 19:34   [place] Intraoperative  Result Date: 05/23/2020 INDICATION: Patient admitted with new diagnosis of malignancy of unknown etiology now with bilateral obstructive hydronephrosis. Request made for placement of bilateral percutaneous nephrostomy catheters for urinary diversion purposes. EXAM: 1. ULTRASOUND GUIDANCE FOR PUNCTURE OF THE BILATERAL RENAL COLLECTING SYSTEMS 2. BILATERAL PERCUTANEOUS NEPHROSTOMY TUBE PLACEMENT. COMPARISON:  CT abdomen and pelvis-05/22/2020 MEDICATIONS: Ancef 2 gm IV; The antibiotic was administered in an appropriate time frame prior to skin puncture. ANESTHESIA/SEDATION: Moderate (conscious) sedation was  employed during this procedure. A total of Versed 4 mg and Fentanyl 100 mcg was administered intravenously. Moderate Sedation Time: 22 minutes. The patient's level of consciousness and vital signs were monitored continuously by radiology nursing throughout the procedure under my direct supervision. CONTRAST:  A total of 20 cc Visipaque 320 was administered into both renal collecting systems. FLUOROSCOPY TIME:  1 minute, 42 seconds (85.2 mGy) COMPLICATIONS: None immediate. PROCEDURE: The procedure, risks, benefits, and alternatives were explained to the patient. Questions regarding the procedure were encouraged and answered. The patient understands and consents to the procedure. A timeout was performed prior to the initiation of the procedure. The bilateral flanks were prepped and draped in the usual sterile fashion and a sterile drape was applied covering the operative field. A sterile gown and sterile gloves were used for the procedure. Local anesthesia was provided with 1% Lidocaine with epinephrine. Attention was first paid towards placement of the left-sided percutaneous nephrostomy catheter. Ultrasound was used to localize the left kidney. Under direct ultrasound guidance, a 20 gauge needle was advanced into the renal collecting system. An ultrasound image documentation was performed. Access within the collecting system was confirmed with the efflux of urine followed by limited contrast injection. Over a  Nitrex wire, the tract was dilated with an Accustick stent. Next, under intermittent fluoroscopic guidance and over a short Amplatz wire, the track was dilated ultimately allowing placement of a 10-French percutaneous nephrostomy catheter which was advanced to the level of the renal pelvis where the coil was formed and locked. Contrast was injected and several spot fluoroscopic images were obtained in various obliquities. The catheter was secured at the skin with a Prolene retention suture and stat lock device  and connected to a gravity bag was placed. The identical procedure was repeated for the contralateral right kidney allowing placement of a 10 French percutaneous nephrostomy catheter with end ultimately coiled and locked within the right renal pelvis. Dressings were applied. The patient tolerated procedure well without immediate postprocedural complication. 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 Under a combination of ultrasound and fluoroscopic guidance, a posterior inferior calix was targeted bilaterally allowing placement of bilateral 10 French percutaneous nephrostomy catheters with ends coiled and locked within the bilateral renal pelvises. Contrast injection confirmed appropriate positioning. IMPRESSION: Successful ultrasound and fluoroscopic guided placement of bilateral 10 French PCNs. Electronically Signed   By: Sandi Mariscal M.D.   On: 05/23/2020 12:55   US RENAL  Result Date: 05/23/2020 CLINICAL DATA:  Acute kidney injury EXAM: RENAL / URINARY TRACT ULTRASOUND COMPLETE COMPARISON:  CT 05/22/2020 FINDINGS: Right Kidney: Renal measurements: 11.5 x 5.2 x 5.5 cm = volume: 170 mL. Mild renal cortical thinning with increased cortical echogenicity. Severe right hydronephrosis. The visualized right ureter is moderately dilated. No discrete shadowing stone or mass lesion identified. Left Kidney: Renal measurements: 12.5 x 5.2 x 4.7 cm = volume: 162 mL. Cortical thickness within normal limits. Increased renal cortical echogenicity. Moderate to severe hydronephrosis. Visualized left ureter is mildly dilated. No discrete shadowing stone or mass lesion identified. Bladder: Partially distended.  No definite abnormality. Other: Inferior right lower lobe solid intrahepatic mass measuring up to 6.0 cm. Irregular and heterogeneous appearance of the uterus is partially  visualized on images within the pelvis, incompletely characterized. IMPRESSION: 1. Severe right hydroureteronephrosis. 2. Moderate to severe left hydronephrosis. 3. Increased renal cortical echogenicity bilaterally suggesting medical renal disease. 4. Very irregular appearance of visualized portion of the uterus noted on transabdominal imaging through the pelvis. Recommend dedicated pelvic ultrasound for further evaluation. 5. Solid 6.0 cm inferior right hepatic lobe mass most suggestive of metastatic disease based on the previous CT. Electronically Signed   By: Davina Poke D.O.   On: 05/23/2020 10:04   US BIOPSY (LIVER)  Result Date: 05/26/2020 INDICATION: Rectal mass with associated liver masses. EXAM: ULTRASOUND GUIDED CORE BIOPSY OF LIVER MEDICATIONS: None. ANESTHESIA/SEDATION: Fentanyl 1.0 mcg IV; Versed 50 mg IV Moderate Sedation Time:  15 minutes. The patient was continuously monitored during the procedure by the interventional radiology nurse under my direct supervision. PROCEDURE: The procedure, risks, benefits, and alternatives were explained to the patient. Questions regarding the procedure were encouraged and answered. The patient understands and consents to the procedure. A time-out was performed prior to initiating the procedure. Ultrasound was performed to localize liver lesions. The abdominal wall was prepped with chlorhexidine in a sterile fashion, and a sterile drape was applied covering the operative field. A sterile gown and sterile gloves were used for the procedure. Local anesthesia was provided with 1% Lidocaine. Under ultrasound guidance, a 17 gauge trocar needle was advanced into the liver at the level  of a lesion within the right lobe. Three separate coaxial 18 gauge core biopsy samples were obtained with an 18 gauge device. Material was submitted in formalin. Gel-Foam pledgets were advanced through the outer needle as it was retracted and removed. Additional ultrasound was  performed. COMPLICATIONS: None immediate. FINDINGS: The largest mass in the liver as noted by CT is within the inferior aspect of the right lobe. By ultrasound this lesion measures approximately 6.5 x 5.1 x 6.1 cm. Solid tissue was obtained. IMPRESSION: Ultrasound-guided core biopsy performed at the level of a 6.5 cm mass within the inferior aspect of the right lobe of the liver. Electronically Signed   By: Aletta Edouard M.D.   On: 05/26/2020 15:27   IR NEPHROSTOMY PLACEMENT LEFT  Result Date: 05/23/2020 INDICATION: Patient admitted with new diagnosis of malignancy of unknown etiology now with bilateral obstructive hydronephrosis. Request made for placement of bilateral percutaneous nephrostomy catheters for urinary diversion purposes. EXAM: 1. ULTRASOUND GUIDANCE FOR PUNCTURE OF THE BILATERAL RENAL COLLECTING SYSTEMS 2. BILATERAL PERCUTANEOUS NEPHROSTOMY TUBE PLACEMENT. COMPARISON:  CT abdomen and pelvis-05/22/2020 MEDICATIONS: Ancef 2 gm IV; The antibiotic was administered in an appropriate time frame prior to skin puncture. ANESTHESIA/SEDATION: Moderate (conscious) sedation was employed during this procedure. A total of Versed 4 mg and Fentanyl 100 mcg was administered intravenously. Moderate Sedation Time: 22 minutes. The patient's level of consciousness and vital signs were monitored continuously by radiology nursing throughout the procedure under my direct supervision. CONTRAST:  A total of 20 cc Visipaque 320 was administered into both renal collecting systems. FLUOROSCOPY TIME:  1 minute, 42 seconds (41.9 mGy) COMPLICATIONS: None immediate. PROCEDURE: The procedure, risks, benefits, and alternatives were explained to the patient. Questions regarding the procedure were encouraged and answered. The patient understands and consents to the procedure. A timeout was performed prior to the initiation of the procedure. The bilateral flanks were prepped and draped in the usual sterile fashion and a sterile  drape was applied covering the operative field. A sterile gown and sterile gloves were used for the procedure. Local anesthesia was provided with 1% Lidocaine with epinephrine. Attention was first paid towards placement of the left-sided percutaneous nephrostomy catheter. Ultrasound was used to localize the left kidney. Under direct ultrasound guidance, a 20 gauge needle was advanced into the renal collecting system. An ultrasound image documentation was performed. Access within the collecting system was confirmed with the efflux of urine followed by limited contrast injection. Over a Nitrex wire, the tract was dilated with an Accustick stent. Next, under intermittent fluoroscopic guidance and over a short Amplatz wire, the track was dilated ultimately allowing placement of a 10-French percutaneous nephrostomy catheter which was advanced to the level of the renal pelvis where the coil was formed and locked. Contrast was injected and several spot fluoroscopic images were obtained in various obliquities. The catheter was secured at the skin with a Prolene retention suture and stat lock device and connected to a gravity bag was placed. The identical procedure was repeated for the contralateral right kidney allowing placement of a 10 French percutaneous nephrostomy catheter with end ultimately coiled and locked within the right renal pelvis. Dressings were applied. The patient tolerated procedure well without immediate postprocedural complication. 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 Under a combination of ultrasound and fluoroscopic guidance, a posterior inferior calix was targeted bilaterally allowing placement of bilateral 10 French percutaneous nephrostomy  catheters with ends coiled and locked within the bilateral renal pelvises. Contrast injection confirmed  appropriate positioning. IMPRESSION: Successful ultrasound and fluoroscopic guided placement of bilateral 10 French PCNs. Electronically Signed   By: Sandi Mariscal M.D.   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 Request made for placement of bilateral percutaneous nephrostomy catheters for urinary diversion purposes. EXAM: 1. ULTRASOUND GUIDANCE FOR PUNCTURE OF THE BILATERAL RENAL COLLECTING SYSTEMS 2. BILATERAL PERCUTANEOUS NEPHROSTOMY TUBE PLACEMENT. COMPARISON:  CT abdomen and pelvis-05/22/2020 MEDICATIONS: Ancef 2 gm IV; The antibiotic was administered in an appropriate time frame prior to skin puncture. ANESTHESIA/SEDATION: Moderate (conscious) sedation was employed during this procedure. A total of Versed 4 mg and Fentanyl 100 mcg was administered intravenously. Moderate Sedation Time: 22 minutes. The patient's level of consciousness and vital signs were monitored continuously by radiology nursing throughout the procedure under my direct supervision. CONTRAST:  A total of 20 cc Visipaque 320 was administered into both renal collecting systems. FLUOROSCOPY TIME:  1 minute, 42 seconds (53.6 mGy) COMPLICATIONS: None immediate. PROCEDURE: The procedure, risks, benefits, and alternatives were explained to the patient. Questions regarding the procedure were encouraged and answered. The patient understands and consents to the procedure. A timeout was performed prior to the initiation of the procedure. The bilateral flanks were prepped and draped in the usual sterile fashion and a sterile drape was applied covering the operative field. A sterile gown and sterile gloves were used for the procedure. Local anesthesia was provided with 1% Lidocaine with epinephrine. Attention was first paid towards placement of the left-sided percutaneous nephrostomy  catheter. Ultrasound was used to localize the left kidney. Under direct ultrasound guidance, a 20 gauge needle was advanced into the renal collecting system. An ultrasound image documentation was performed. Access within the collecting system was confirmed with the efflux of urine followed by limited contrast injection. Over a Nitrex wire, the tract was dilated with an Accustick stent. Next, under intermittent fluoroscopic guidance and over a short Amplatz wire, the track was dilated ultimately allowing placement of a 10-French percutaneous nephrostomy catheter which was advanced to the level of the renal pelvis where the coil was formed and locked. Contrast was injected and several spot fluoroscopic images were obtained in various obliquities. The catheter was secured at the skin with a Prolene retention suture and stat lock device and connected to a gravity bag was placed. The identical procedure was repeated for the contralateral right kidney allowing placement of a 10 French percutaneous nephrostomy catheter with end ultimately coiled and locked within the right renal pelvis. Dressings were applied. The patient tolerated procedure well without immediate postprocedural complication. 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 Under a combination of ultrasound and fluoroscopic guidance, a posterior inferior calix was targeted bilaterally allowing placement of bilateral 10 French percutaneous nephrostomy catheters with ends coiled and locked within the bilateral renal pelvises. Contrast injection confirmed appropriate positioning. IMPRESSION: Successful ultrasound and fluoroscopic guided placement of bilateral 10 French PCNs. Electronically Signed   By: Sandi Mariscal M.D.   On: 05/23/2020 12:55    ASSESSMENT: Stage IV colorectal cancer  PLAN:    1.   Stage IV  colorectal cancer: Patient with metastatic lesions in liver, although pathology from biopsy is still pending. Pattern of spread suggests  colon primary over rectal primary. CEA is only mildly elevated at 9.9.  CT of the chest did not reveal any metastatic disease. Patient will benefit from palliative chemotherapy using FOLFOX. Could consider Avastin in the future if bleeding subsides. Patient has port placement scheduled for tomorrow. Appreciate surgical input. Patient will follow-up as an outpatient in the cancer center early next week to further discuss and initiate treatment. 2. Anemia: Secondary to GI bleed. Patient's hemoglobin is trending down is now 8.7. Plan for GI to attempt hemostasis. If unsuccessful, could possibly consult radiation oncology to do palliative XRT to stop bleeding. Continue to monitor hemoglobin and transfuse if hemoglobin falls below 7.0. 3.  Acute renal failure: Significantly improved with bilateral nephrostomy tubes. Creatinine 2.19 today.  Will follow.   [name], MD   05/28/2020 8:38 AM

## 2020-05-28 NOTE — Progress Notes (Signed)
PROGRESS NOTE  Anita Sullivan RFF:[id number] DOB: June 21, 1966 DOA: 05/22/2020 PCP: Volney American, PA-C  Brief History   Anita Boehlke Uribeis a 54 y.o.femaleadmitted last night with rectal bleeding. Ongoing for 3 months on and off.The emergency room her hemoglobin was noted to be 6.4 g with an MCV of 75.8. Creatinine of 9.82 and CO2 of 16. Large amount of blood and leukocytes in the urine.  CT scan of the abdomen and pelvis that demonstrated marked severe sigmoid colitis numerous, numerous areas of soft tissue nodularity within the mesentery suggestive of an underlying neoplastic process that cannot be excluded heterogeneous liver masses noted suspicious for a neoplasm. Moderately severe Faera aortic and aortocaval lymphadenopathy noted marked severe bilateral hydronephrosis and hydroureter representing partial renal obstruction. High concern for extensive malignancy. Patient has quite a bit of unintentional weight loss recently. No family history of malignancy.  Multiple specialities were consulted this morning which include GI, urology, nephrology and oncology. She is diagnosed with stage IV colorectal cancer by oncology. Chemotherapy is to start next week. She underwent emergent bilateral nephrostomy tube placement by IR and draining well now.  The patient underwent flexible sigmoidoscopy today which provided visual confirmation of the mass. Hemostasis was obtained and biopsies taken. The patient was returned to her room on a soft diet.   Plan is for the patient to go to surgery tomorrow. She will also have a port placed tomorrow.  Consultants  . Gastroenterology . General surgery . Oncology  Procedures  . Flexible Sigmoidoscopy . IR for placement of bilateral nephrostomy tubes. . Liver biopsy with ultrasound  Antibiotics   Anti-infectives (From admission, onward)   Start     Dose/Rate Route Frequency Ordered Stop   05/29/20 0600  ceFAZolin (ANCEF) IVPB 2g/100 mL  premix        2 g 200 mL/hr over 30 Minutes Intravenous On call to O.R. 05/28/20 1248 05/30/20 0559   05/28/20 1100  ceFAZolin (ANCEF) IVPB 2g/100 mL premix  Status:  Discontinued        2 g 200 mL/hr over 30 Minutes Intravenous On call to O.R. 05/28/20 1010 05/28/20 1247   05/23/20 1100  ceFAZolin (ANCEF) IVPB 2g/100 mL premix        2 g 200 mL/hr over 30 Minutes Intravenous To Radiology 05/23/20 1046 05/24/20 1100   05/23/20 1049  ceFAZolin (ANCEF) 2-4 GM/100ML-% IVPB       Note to Pharmacy: Genelle Bal   : cabinet override      05/23/20 1049 05/23/20 2259    .   Subjective  The patient is resting comfortably prior to her flexible sigmoidoscopy.  Objective   Vitals:  Vitals:   05/28/20 1205 05/28/20 1215  BP: 117/78 117/65  Pulse: 80 94  Resp: 18 18  Temp:    SpO2: 99% 100%    Exam:  Constitutional:  . The patient is awake, alert, and oriented x 3. No acute distress. Respiratory:  . No increased work of breathing. . No wheezes, rales, or rhonchi . No tactile fremitus Cardiovascular:  . Regular rate and rhythm . No murmurs, ectopy, or gallups. . No lateral PMI. No thrills. Abdomen:  . Abdomen is soft, non-tender, non-distended . No hernias, masses, or organomegaly . Normoactive bowel sounds.  Musculoskeletal:  . No cyanosis, clubbing, or edema Skin:  . No rashes, lesions, ulcers . palpation of skin: no induration or nodules Neurologic:  . CN 2-12 intact . Sensation all 4 extremities intact Psychiatric:  . Mental status  o Mood, affect appropriate o Orientation to person, place, time  . judgment and insight appear intact  I have personally reviewed the following:   Today's Data  . Vitals, BMP, CBC  Micro Data  . Urine culture: No growth  Imaging  . CT abdomen and pelvis . CT chest  Scheduled Meds: . calcium acetate  667 mg Oral TID WC  . fentaNYL      . midazolam      . pantoprazole (PROTONIX) IV  40 mg Intravenous Q12H  . potassium  chloride  20 mEq Oral Once  . sodium bicarbonate  650 mg Oral TID   Continuous Infusions: . sodium chloride    . [START ON 05/29/2020]  ceFAZolin (ANCEF) IV    . potassium chloride 10 mEq (05/28/20 1039)    Principal Problem:   GI bleed Active Problems:   Acute renal failure (HCC)   Obstructive uropathy   Colitis, acute   Bleeding per rectum   Rectal bleed   Colorectal cancer, stage IV ([place])   LOS: 6 days   A & P  Lower GI bleed: Due to Stage IV Colorectal Cancer. S/P Flexible sigmoidoscopy today with hemostasis achieved after ongoing bleeding overnight. Rectal mass 2 cm vrom anal verge.  Patient had unintentional weight loss and intermittent rectal bleeding going on for about a year now. She has received total of 3 units of PRBC.  Also received IV iron.  Hemoglobin is down from 9.3 to 8.7 today. Monitor and transfuse for hemoglobin less than 8.0. Oncology is following-appreciate their recommendations.  Anemia panel with anemia of chronic disease, no significant iron deficiency. 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 She will go for surgery tomorrow as well as port placement. Plan is for chemotherapy to start next week.  Acute kidney injury: Most likely secondary to obstructive uropathy as evident on CT abdomen. Urology was also consulted and according to their advice IR was consulted for bilateral emergent nephrostomy tube placement which was done yesterday. Patient draining well from nephrostomy tubes, both bags with some blood tinted urine one more than the other. Urology signed off -will need replacement of nephrostomy tubes in 4 to 6 weeks by IR. Nephrology recommendations are appreciate. She was started on calcium binders by nephrology yesterday. Creatinine started trending down, it was 2.19 with BUN of 18 today.  Bicarb at 28. Continue to encourage PO hydration. Continue oral sodium bicarbonate. Monitor creatinine, electrolytes, and volume status.  Avoid nephrotoxins and hypotension.  Anion gap metabolic acidosis: Resolved.  Anion gap resolved.  Lactic acid within normal limit. Most likely secondary to severe acute AKI and GI bleed.  Bicarb started improving and normalized.  I have seen and examined this patient myself. I have spent 35 minutes in her evaluation and care.  Derionna Salvador, DO [facility] Direct contact: see [email or web address]  7PM-7AM contact night coverage as above 05/28/2020, 1:05 PM  LOS: 6 days

## 2020-05-28 NOTE — Progress Notes (Signed)
Talked to Dr. Hampton Abbot about patients diet order.  He gave order for NPO after midnight and ice chips up until 6am.  Orders placed

## 2020-05-28 NOTE — Progress Notes (Signed)
[facility]  ROUNDING NOTE   Subjective:   Patient appears to be in good spirits today. Creatinine now down to 2.19. Urine output from nephrostomies appears to be clearing.  Objective:  Vital signs in last 24 hours:  Temp:  [97.3 F (36.3 C)-98.3 F (36.8 C)] 98.2 F (36.8 C) (10/27 1556) Pulse Rate:  [76-102] 88 (10/27 1556) Resp:  [16-23] 16 (10/27 1556) BP: (88-124)/(47-86) 112/65 (10/27 1556) SpO2:  [98 %-100 %] 99 % (10/27 1556) Weight:  [59 kg] 59 kg (10/27 1047)  Weight change:  Filed Weights   05/22/20 1537 05/27/20 1108 05/28/20 1047  Weight: 59 kg 59 kg 59 kg    Intake/Output: I/O last 3 completed shifts: In: 3512.1 [I.V.:2762.1; Other:750] Out: 244 [Urine:570; Other:4]   Intake/Output this shift:  Total I/O In: 100 [IV Piggyback:100] Out: -   Physical Exam: General: No acute distress  Head: Normocephalic, atraumatic. Moist oral mucosal membranes  Eyes: Anicteric  Lungs:  Clear to auscultation bilaterally  Heart: Regular rate and rhythm  Abdomen:  Soft, mild epigastric tenderness  Extremities: No peripheral edema.  Neurologic: Oriented ,moving all four extremities  Skin: No lesions or rashes  Bil.Nephrostomy tubes clear, yellow    Basic Metabolic Panel: Recent Labs  Lab 05/24/20 0343 05/24/20 0343 05/25/20 0500 05/25/20 0500 05/26/20 0450 05/27/20 0547 05/28/20 0451  NA 137  --  139  --  144 138 139  K 4.1  --  3.6  --  3.3* 3.6 3.3*  CL 108  --  109  --  108 103 102  CO2 18*  --  18*  --  25 25 28   GLUCOSE 115*  --  93  --  100* 116* 105*  BUN 84*  --  68*  --  46* 26* 18  CREATININE 9.89*  --  7.27*  --  4.83* 2.99* 2.19*  CALCIUM 7.8*   < > 7.8*   < > 8.5* 8.4* 8.3*  MG  --   --   --   --  1.9  --   --   PHOS 6.8*  --  5.5*  --  4.1 2.9 2.9   < > = values in this interval not displayed.    Liver Function Tests: Recent Labs  Lab 05/22/20 1542 05/22/20 1542 05/24/20 0343 05/25/20 0500 05/26/20 0450 05/27/20 0547  05/28/20 0451  AST 14*  --   --   --   --   --   --   ALT 9  --   --   --   --   --   --   ALKPHOS 62  --   --   --   --   --   --   BILITOT 0.5  --   --   --   --   --   --   PROT 7.7  --   --   --   --   --   --   ALBUMIN 3.0*   < > 2.5* 2.4* 2.5* 2.7* 2.5*   < > = values in this interval not displayed.   Recent Labs  Lab 05/22/20 1934  LIPASE 27   No results for input(s): AMMONIA in the last 168 hours.  CBC: Recent Labs  Lab 05/24/20 0233 05/25/20 0422 05/26/20 0450 05/27/20 0547 05/28/20 0451  WBC 9.3 9.7 9.3 9.8 8.1  HGB 6.4* 9.1* 9.2* 9.3* 8.7*  HCT 19.4* 27.5* 27.9* 28.8* 26.7*  MCV 76.4* 79.9* 79.0*  81.6 82.9  PLT 392 354 393 423* 341    Cardiac Enzymes: No results for input(s): CKTOTAL, CKMB, CKMBINDEX, TROPONINI in the last 168 hours.  BNP: Invalid input(s): POCBNP  CBG: No results for input(s): GLUCAP in the last 168 hours.  Microbiology: Results for orders placed or performed during the hospital encounter of 05/22/20  Respiratory Panel by RT PCR (Flu A&B, Covid) - Nasopharyngeal Swab     Status: None   Collection Time: 05/22/20  7:34 PM   Specimen: Nasopharyngeal Swab  Result Value Ref Range Status   SARS Coronavirus 2 by RT PCR NEGATIVE NEGATIVE Final    Comment: (NOTE) SARS-CoV-2 target nucleic acids are NOT DETECTED.  The SARS-CoV-2 RNA is generally detectable in upper respiratoy specimens during the acute phase of infection. The lowest concentration of SARS-CoV-2 viral copies this assay can detect is 131 copies/mL. A negative result does not preclude SARS-Cov-2 infection and should not be used as the sole basis for treatment or other patient management decisions. A negative result may occur with  improper specimen collection/handling, submission of specimen other than nasopharyngeal swab, presence of viral mutation(s) within the areas targeted by this assay, and inadequate number of viral copies (<131 copies/mL). A negative result must be  combined with clinical observations, patient history, and epidemiological information. The expected result is Negative.  Fact Sheet for Patients:  PinkCheek.be  Fact Sheet for Healthcare Providers:  GravelBags.it  This test is no t yet approved or cleared by the [place] FDA and  has been authorized for detection and/or diagnosis of SARS-CoV-2 by FDA under an Emergency Use Authorization (EUA). This EUA will remain  in effect (meaning this test can be used) for the duration of the COVID-19 declaration under Section 564(b)(1) of the Act, 21 U.S.C. section 360bbb-3(b)(1), unless the authorization is terminated or revoked sooner.     Influenza A by PCR NEGATIVE NEGATIVE Final   Influenza B by PCR NEGATIVE NEGATIVE Final    Comment: (NOTE) The Xpert Xpress SARS-CoV-2/FLU/RSV assay is intended as an aid in  the diagnosis of influenza from Nasopharyngeal swab specimens and  should not be used as a sole basis for treatment. Nasal washings and  aspirates are unacceptable for Xpert Xpress SARS-CoV-2/FLU/RSV  testing.  Fact Sheet for Patients: PinkCheek.be  Fact Sheet for Healthcare Providers: GravelBags.it  This test is not yet approved or cleared by the [place] FDA and  has been authorized for detection and/or diagnosis of SARS-CoV-2 by  FDA under an Emergency Use Authorization (EUA). This EUA will remain  in effect (meaning this test can be used) for the duration of the  Covid-19 declaration under Section 564(b)(1) of the Act, 21  U.S.C. section 360bbb-3(b)(1), unless the authorization is  terminated or revoked. Performed at [facility], [address] [place], [place] [address]   Gastrointestinal Panel by PCR , Stool     Status: None   Collection Time: 05/24/20  1:00 AM   Specimen: Stool  Result Value Ref Range Status   Campylobacter species  NOT DETECTED NOT DETECTED Final   Plesimonas shigelloides NOT DETECTED NOT DETECTED Final   Salmonella species NOT DETECTED NOT DETECTED Final   Yersinia enterocolitica NOT DETECTED NOT DETECTED Final   Vibrio species NOT DETECTED NOT DETECTED Final   Vibrio cholerae NOT DETECTED NOT DETECTED Final   Enteroaggregative E coli (EAEC) NOT DETECTED NOT DETECTED Final   Enteropathogenic E coli (EPEC) NOT DETECTED NOT DETECTED Final   Enterotoxigenic E coli (ETEC)  NOT DETECTED NOT DETECTED Final   Shiga like toxin producing E coli (STEC) NOT DETECTED NOT DETECTED Final   Shigella/Enteroinvasive E coli (EIEC) NOT DETECTED NOT DETECTED Final   Cryptosporidium NOT DETECTED NOT DETECTED Final   Cyclospora cayetanensis NOT DETECTED NOT DETECTED Final   Entamoeba histolytica NOT DETECTED NOT DETECTED Final   Giardia lamblia NOT DETECTED NOT DETECTED Final   Adenovirus F40/41 NOT DETECTED NOT DETECTED Final   Astrovirus NOT DETECTED NOT DETECTED Final   Norovirus GI/GII NOT DETECTED NOT DETECTED Final   Rotavirus A NOT DETECTED NOT DETECTED Final   Sapovirus (I, II, IV, and V) NOT DETECTED NOT DETECTED Final    Comment: Performed at [facility], [address] [place], [place] [address]  C Difficile Quick Screen w PCR reflex     Status: None   Collection Time: 05/24/20  1:00 AM   Specimen: Stool  Result Value Ref Range Status   C Diff antigen NEGATIVE NEGATIVE Final   C Diff toxin NEGATIVE NEGATIVE Final   C Diff interpretation No C. difficile detected.  Final    Comment: Performed at [facility], [address] [place], [place] [address]  Urine Culture     Status: None   Collection Time: 05/25/20  4:31 AM   Specimen: Urine, Catheterized  Result Value Ref Range Status   Specimen Description   Final    URINE, CATHETERIZED Performed at [facility], [address], [place], [place] [address]    Special Requests   Final    NONE Performed at [facility], [address] [place], [place] [address]    Culture   Final    NO GROWTH Performed at [facility], [address] [place],  [address]    Report Status 05/26/2020 FINAL  Final    Coagulation Studies: Recent Labs    05/26/20 0946  LABPROT 13.5  INR 1.1    Urinalysis: No results for input(s): COLORURINE, LABSPEC, PHURINE, GLUCOSEU, HGBUR, BILIRUBINUR, KETONESUR, PROTEINUR, UROBILINOGEN, NITRITE, LEUKOCYTESUR in the last 72 hours.  Invalid input(s): APPERANCEUR    Imaging: CT CHEST WO CONTRAST  Result Date: 05/27/2020 CLINICAL DATA:  New diagnosis of rectal cancer. EXAM: CT CHEST WITHOUT CONTRAST TECHNIQUE: Multidetector CT imaging of the chest was performed following the standard protocol without IV contrast. COMPARISON:  Abdominal CT 05/22/2020 FINDINGS: Cardiovascular: Normal aortic caliber. Normal heart size, without pericardial effusion. Mediastinum/Nodes: No supraclavicular adenopathy. No mediastinal or definite hilar adenopathy, given limitations of unenhanced CT. Lungs/Pleura: No pleural fluid. Left base scarring or subsegmental atelectasis. Nonspecific 2 mm right upper lobe pulmonary nodule on 29/3. Calcified granuloma of 2 mm in the right lower lobe on 85/3. Upper Abdomen: Subtle hypoattenuating hepatic dome lesion again identified. The dominant right hepatic lobe mass is excluded. Normal imaged portions of the spleen, stomach, pancreas, adrenal glands, right kidney. Musculoskeletal: Presumed bone island within the sternal body. IMPRESSION: 1.  No acute process or evidence of metastatic disease in the chest. 2. Nonspecific tiny right upper lobe pulmonary nodule. No thoracic adenopathy. 3. Liver masses, as before. Electronically Signed   By: Abigail Miyamoto M.D.   On: 05/27/2020 19:34     Medications:   . sodium chloride    . [START ON 05/29/2020]  ceFAZolin (ANCEF) IV     . calcium acetate  667 mg Oral TID WC  . fentaNYL      . midazolam      .  pantoprazole (PROTONIX) IV  40 mg Intravenous Q12H  . potassium chloride  20 mEq Oral Once  . sodium bicarbonate  650 mg Oral TID   ondansetron **OR** ondansetron (ZOFRAN) IV  Assessment/ Plan:  Ms. Lilienne Weins is a 54 y.o.  female presented to the emergency room with the chief complaint of rectal bleeding for last 3 months.CT scan of the abdomen and pelvis that demonstrated marked severe sigmoid colitis numerous, numerous areas of soft tissue nodularity within the mesentery suggestive of an underlying neoplastic process that cannot be excluded heterogeneous liver masses noted suspicious for a neoplasm. Moderately severe Faera aortic and aortocaval lymphadenopathy noted marked severe bilateral hydronephrosis and hydroureter representing partial renal obstruction.  Patient was consulted for nephrology for acute kidney injury.  She has bilateral nephrostomy tubes, placed during this admission.  # Acute kidney injury secondary to obstruction Creatinine now down to 2.19.  Good urine output noted from nephrostomies and urine does appear to be clearing.  Continue to monitor renal function.  #Anemia secondary to chronic disease Hemoglobin down a bit to 8.7.  Gastroenterology following closely.  #Metabolic acidosis Serum bicarbonate now up to 28.  May consider stopping sodium bicarbonate   LOS: 6 Anita Sullivan 10/27/20214:37 PM

## 2020-05-28 NOTE — Anesthesia Postprocedure Evaluation (Signed)
Anesthesia Post Note  Patient: Anita Sullivan  Procedure(s) Performed: COLONOSCOPY WITH PROPOFOL (N/A )  Patient location during evaluation: Endoscopy Anesthesia Type: General Level of consciousness: awake and alert and oriented Pain management: pain level controlled Vital Signs Assessment: post-procedure vital signs reviewed and stable Respiratory status: spontaneous breathing Cardiovascular status: blood pressure returned to baseline Anesthetic complications: no   No complications documented.   Last Vitals:  Vitals:   05/28/20 1205 05/28/20 1215  BP: 117/78 117/65  Pulse: 80 94  Resp: 18 18  Temp:    SpO2: 99% 100%    Last Pain:  Vitals:   05/28/20 1215  TempSrc:   PainSc: 0-No pain                 Shawny Borkowski

## 2020-05-28 NOTE — Plan of Care (Signed)
Progressing in their care.  Plan for port a cath placement tomorrow.  Only a small amount of blood this morning

## 2020-05-28 NOTE — Telephone Encounter (Signed)
Called and spoke with Inez Catalina on 2-C with follow-up appts for pt. Patient is expectd to arrive on 06/03/20 @ 8:45 for labs, Chemo Class and Follow-up with Dr. Grayland Ormond.

## 2020-05-28 NOTE — Progress Notes (Signed)
Patient soiled two inserts with moderate amount of bright red blood from rectum.

## 2020-05-28 NOTE — Progress Notes (Signed)
Anita Sullivan, Anita Sullivan, Anita Sullivan, Anita Sullivan, down to 8.7. Renal function continues to improve, sCr - 2.19. UO - 400 ccs. Mild hypokalemia to 3.3.  She is NPO this morning for planned GI procedures. Seen by oncology this morning, plan for chemotherapy next week. We will tentatively plan for port-a-catheter placement tomorrow (10/28).   Review of Systems:  Constitutional: denies fever, chills  HEENT: denies cough or congestion  Respiratory: denies any shortness of breath  Cardiovascular: denies chest Sullivan or palpitations  Gastrointestinal: denies abdominal Sullivan, N/V, or diarrhea/and bowel function as per interval history, + bright red stool  Genitourinary: denies burning with urination or urinary frequency  Vital signs in last 24 hours: [min-max] current  Temp:  [97.9 F (36.6 C)-98.8 F (37.1 C)] 98.2 F (36.8 C) (10/27 0445) Pulse Rate:  [70-102] 80 (10/27 0445) Resp:  [14-33] 19 (10/27 0445) BP: (88-132)/(47-96) 124/68 (10/27 0445) SpO2:  [98 %-100 %] 98 % (10/27 0445) Weight:  [59 kg] 59 kg (10/26 1108)     Height: 5' (152.4 cm) Weight: 59 kg BMI (Calculated): 25.4   Intake/Output last 2 shifts:  10/26 0701 - 10/27 0700 In: 1150 [I.V.:400] Out: 399 [Urine:395]   Physical Exam:  Constitutional: alert, cooperative and Anita distress  HENT: normocephalic without obvious abnormality  Eyes: PERRL, EOM's grossly intact and symmetric  Respiratory: breathing non-labored at rest  Cardiovascular: regular rate and sinus rhythm  Gastrointestinal: Soft, non-tender, and non-distended, Anita rebound/guarding. Nephrostomy tubes bilaterally Musculoskeletal: Anita edema  or wounds, motor and sensation grossly intact, NT    Labs:  CBC Latest Ref Rng & Units 05/28/2020 05/27/2020 05/26/2020  WBC 4.0 - 10.5 K/uL 8.1 9.8 9.3  Hemoglobin 12.0 - 15.0 g/dL 8.7(L) 9.3(L) 9.2(L)  Hematocrit 36 - 46 % 26.7(L) 28.8(L) 27.9(L)  Platelets 150 - 400 K/uL 341 423(H) 393   CMP Latest Ref Rng & Units 05/28/2020 05/27/2020 05/26/2020  Glucose 70 - 99 mg/dL 105(H) 116(H) 100(H)  BUN 6 - 20 mg/dL 18 26(H) 46(H)  Creatinine 0.44 - 1.00 mg/dL 2.19(H) 2.99(H) 4.83(H)  Sodium 135 - 145 mmol/L 139 138 144  Potassium 3.5 - 5.1 mmol/L 3.3(L) 3.6 3.3(L)  Chloride 98 - 111 mmol/L 102 103 108  CO2 22 - 32 mmol/L 28 25 25   Calcium 8.9 - 10.3 mg/dL 8.3(L) 8.4(L) 8.5(L)  Total Protein 6.5 - 8.1 g/dL - - -  Total Bilirubin 0.3 - 1.2 mg/dL - - -  Alkaline Phos 38 - 126 U/L - - -  AST 15 - 41 U/L - - -  ALT 0 - 44 U/L - - -     Imaging studies: Anita new pertinent imaging studies   Assessment/Plan: (ICD-10's: K59.89) 54 y.o. female presenting with bright red blood per rectum, found to have patrtially obstructing ulcerated rectal mass approximately 2 cm from the anal verge, concerning for metastatic neoplasm given concomitant liver masses (biopsied, pathology pending), ara-aortic and aortocaval lymphadenopathy, and hydronephrosis s/p bilateral percutaneous nephrostomy tube placement on 10/22.    - We will plan on port-a-catheter on 10/28 with Dr Hampton Abbot pending OR/Anesthesia availability  - All risks, benefits, and alternatives to above procedure(s) were discussed with the  patient, all of her questions were answered to her expressed satisfaction, patient expresses she wishes to proceed, and informed consent was obtained.   - NPO at midnight   - IV Abx (Ancef); on call to [facility] final pathology results; will follow             - Monitor rectal bleeding & H&H; transfuse as needed   - Monitor abdominal examination; on-going bowel function              - Nephrology following;  Anita need for renal replacement at this time; continue PCNs; monitor and record output  - Appreciate oncology assistance             - Mobilization encouraged             - Further manage per primary service; we will follow   All of the above findings and recommendations were discussed with the patient, and the medical team, and all of patient's questions were answered to her expressed satisfaction.  -- Edison Simon, PA-C [facility] 05/28/2020, 8:15 AM [phone] M-F: 7am - 4pm

## 2020-05-28 NOTE — Progress Notes (Signed)
Mobility Specialist - Progress Note   05/28/20 1543  Mobility  Activity Refused mobility  Mobility performed by Mobility specialist    Pt refused session at this time. No reason specified. Will re-attempt at a later date/time.    Simrat Kendrick Mobility Specialist  05/28/20, 3:44 PM

## 2020-05-28 NOTE — Op Note (Signed)
[facility] Gastroenterology Patient Name: Anita Sullivan Procedure Date: 05/28/2020 11:12 AM MRN: [id number] Account #: [id number] Date of Birth: 02/09/1966 Admit Type: Outpatient Age: 54 Room: [facility] ENDO ROOM 4 Gender: Female Note Status: Finalized Procedure:             Flexible Sigmoidoscopy Indications:           Rectal hemorrhage, Colorectal cancer Providers:             [name] MD, MD Medicines:             Fentanyl 100 micrograms IV, Midazolam 4 mg IV Complications:         No immediate complications. Estimated blood loss: None. Procedure:             Pre-Anesthesia Assessment:                        - Prior to the procedure, a History and Physical was                         performed, and patient medications and allergies were                         reviewed. The patient is competent. The risks and                         benefits of the procedure and the sedation options and                         risks were discussed with the patient. All questions                         were answered and informed consent was obtained.                         Patient identification and proposed procedure were                         verified by the physician, the nurse and the                         technician in the pre-procedure area in the procedure                         room in the endoscopy suite. Mental Status                         Examination: alert and oriented. Airway Examination:                         normal oropharyngeal airway and neck mobility.                         Respiratory Examination: clear to auscultation. CV                         Examination: normal. Prophylactic Antibiotics: The                         patient does  not require prophylactic antibiotics.                         Prior Anticoagulants: The patient has taken no                         previous anticoagulant or antiplatelet agents. ASA                         Grade  Assessment: II - A patient with mild systemic                         disease. After reviewing the risks and benefits, the                         patient was deemed in satisfactory condition to                         undergo the procedure. The anesthesia plan was to use                         moderate sedation / analgesia (conscious sedation).                         Immediately prior to administration of medications,                         the patient was re-assessed for adequacy to receive                         sedatives. The heart rate, respiratory rate, oxygen                         saturations, blood pressure, adequacy of pulmonary                         ventilation, and response to care were monitored                         throughout the procedure. The physical status of the                         patient was re-assessed after the procedure.                        After obtaining informed consent, the scope was passed                         under direct vision. The Endoscope was introduced                         through the anus and advanced to the the rectum. The                         flexible sigmoidoscopy was accomplished without                         difficulty. The patient tolerated the procedure fairly  well. The quality of the bowel preparation was                         adequate. Findings:      An infiltrative and ulcerated partially obstructing large mass was found       in the rectum. The mass was circumferential. Oozing was present. To stop       active bleeding, hemostatic spray was deployed. Multiple sprays were       applied. There was no bleeding at the end of the procedure. Estimated       blood loss: none. 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                         Hemostatic spray applied.                        - No specimens collected. Recommendation:        - Return patient to  hospital ward for ongoing care.                        - Mechanical soft diet. Procedure Code(s):     --- Professional ---                        [phone], 79, Sigmoidoscopy, flexible; with control of                         bleeding, any method Diagnosis Code(s):     --- Professional ---                        C20, Malignant neoplasm of rectum                        K56.690, Other partial intestinal obstruction                        K62.5, Hemorrhage of anus and rectum                        C19, Malignant neoplasm of rectosigmoid junction CPT copyright 2019 [employer] Association. All rights reserved. The codes documented in this report are preliminary and upon coder review may  be revised to meet current compliance requirements. Dr. Ulyess Mort [name] MD, MD 05/28/2020 11:50:03 AM This report has been signed electronically. Number of Addenda: 0 Note Initiated On: 05/28/2020 11:12 AM Total Procedure Duration: 0 hours 7 minutes 9 seconds  Estimated Blood Loss:  Estimated blood loss: none.      [facility]

## 2020-05-28 NOTE — TOC Progression Note (Signed)
Transition of Care [facility]) - Progression Note    Patient Details  Name: Kirin Brandenburger MRN: [id number] Date of Birth: 1966-02-14  Transition of [facility]) CM/SW Contact  Beverly Sessions, RN Phone Number: 05/28/2020, 1:58 PM  Clinical Narrative:    TOC to complete full assessment Printed packet including Summary of [place] and Discount Programs, Medication Management  And [facility]  Application.    I have reached out to [facility] at the Cancer center.  Once patient is set up to come to the outpatient cancer center they will assist with other resources.  He has provided me with his number to provide to the patient.          Expected Discharge Plan and Services                                                 Social Determinants of Health (SDOH) Interventions    Readmission Risk Interventions No flowsheet data found.

## 2020-05-29 ENCOUNTER — Inpatient Hospital Stay: Payer: Medicaid Other

## 2020-05-29 ENCOUNTER — Encounter: Payer: Self-pay | Admitting: Internal Medicine

## 2020-05-29 ENCOUNTER — Inpatient Hospital Stay: Payer: Medicaid Other | Admitting: Certified Registered"

## 2020-05-29 ENCOUNTER — Encounter
Admission: EM | Disposition: A | Discharge status: Home / Self Care | Payer: Self-pay | Source: Home / Self Care | Attending: Internal Medicine

## 2020-05-29 DIAGNOSIS — Z515 Encounter for palliative care: Secondary | ICD-10-CM

## 2020-05-29 DIAGNOSIS — C19 Malignant neoplasm of rectosigmoid junction: Secondary | ICD-10-CM

## 2020-05-29 HISTORY — PX: PORTACATH PLACEMENT: SHX2246

## 2020-05-29 LAB — COMPREHENSIVE METABOLIC PANEL
ALT: 7 U/L (ref 0–44)
AST: 19 U/L (ref 15–41)
Albumin: 2.6 g/dL — ABNORMAL LOW (ref 3.5–5.0)
Alkaline Phosphatase: 43 U/L (ref 38–126)
Anion gap: 8 (ref 5–15)
BUN: 16 mg/dL (ref 6–20)
CO2: 28 mmol/L (ref 22–32)
Calcium: 8.2 mg/dL — ABNORMAL LOW (ref 8.9–10.3)
Chloride: 105 mmol/L (ref 98–111)
Creatinine, Ser: 2.08 mg/dL — ABNORMAL HIGH (ref 0.44–1.00)
GFR, Estimated: 28 mL/min — ABNORMAL LOW (ref >60)
Glucose, Bld: 105 mg/dL — ABNORMAL HIGH (ref 70–99)
Potassium: 3.5 mmol/L (ref 3.5–5.1)
Sodium: 141 mmol/L (ref 135–145)
Total Bilirubin: 0.5 mg/dL (ref 0.3–1.2)
Total Protein: 6.8 g/dL (ref 6.5–8.1)

## 2020-05-29 LAB — CBC WITH DIFFERENTIAL/PLATELET
Abs Immature Granulocytes: 0.04 10*3/uL (ref 0.00–0.07)
Basophils Absolute: 0 10*3/uL (ref 0.0–0.1)
Basophils Relative: 0 %
Eosinophils Absolute: 0.2 10*3/uL (ref 0.0–0.5)
Eosinophils Relative: 2 %
HCT: 26.9 % — ABNORMAL LOW (ref 36.0–46.0)
Hemoglobin: 8.5 g/dL — ABNORMAL LOW (ref 12.0–15.0)
Immature Granulocytes: 0 %
Lymphocytes Relative: 16 %
Lymphs Abs: 1.4 10*3/uL (ref 0.7–4.0)
MCH: 26.3 pg (ref 26.0–34.0)
MCHC: 31.6 g/dL (ref 30.0–36.0)
MCV: 83.3 fL (ref 80.0–100.0)
Monocytes Absolute: 0.7 10*3/uL (ref 0.1–1.0)
Monocytes Relative: 7 %
Neutro Abs: 6.6 10*3/uL (ref 1.7–7.7)
Neutrophils Relative %: 75 %
Platelets: 320 10*3/uL (ref 150–400)
RBC: 3.23 MIL/uL — ABNORMAL LOW (ref 3.87–5.11)
RDW: 17.3 % — ABNORMAL HIGH (ref 11.5–15.5)
WBC: 8.9 10*3/uL (ref 4.0–10.5)
nRBC: 0 % (ref 0.0–0.2)

## 2020-05-29 LAB — MISC LABCORP TEST (SEND OUT): Labcorp test code: 9159

## 2020-05-29 LAB — RENAL FUNCTION PANEL
Albumin: 2.6 g/dL — ABNORMAL LOW (ref 3.5–5.0)
Anion gap: 9 (ref 5–15)
BUN: 16 mg/dL (ref 6–20)
CO2: 28 mmol/L (ref 22–32)
Calcium: 8.1 mg/dL — ABNORMAL LOW (ref 8.9–10.3)
Chloride: 103 mmol/L (ref 98–111)
Creatinine, Ser: 2.15 mg/dL — ABNORMAL HIGH (ref 0.44–1.00)
GFR, Estimated: 27 mL/min — ABNORMAL LOW (ref >60)
Glucose, Bld: 102 mg/dL — ABNORMAL HIGH (ref 70–99)
Phosphorus: 2.2 mg/dL — ABNORMAL LOW (ref 2.5–4.6)
Potassium: 3.4 mmol/L — ABNORMAL LOW (ref 3.5–5.1)
Sodium: 140 mmol/L (ref 135–145)

## 2020-05-29 LAB — SURGICAL PCR SCREEN
MRSA, PCR: NEGATIVE
Staphylococcus aureus: NEGATIVE

## 2020-05-29 SURGERY — INSERTION, TUNNELED CENTRAL VENOUS DEVICE, WITH PORT
Anesthesia: General | Site: Chest | Laterality: Left

## 2020-05-29 MED ORDER — GLYCOPYRROLATE 0.2 MG/ML IJ SOLN
INTRAMUSCULAR | Status: DC | PRN
  Administered 2020-05-29: .2 mg via INTRAVENOUS

## 2020-05-29 MED ORDER — FENTANYL CITRATE (PF) 100 MCG/2ML IJ SOLN: 25.0000 ug | INTRAMUSCULAR | Status: DC | PRN

## 2020-05-29 MED ORDER — PROPOFOL 10 MG/ML IV BOLUS
INTRAVENOUS | Status: AC
  Filled 2020-05-29: qty 20

## 2020-05-29 MED ORDER — ACETAMINOPHEN 500 MG PO TABS: 1000.0000 mg | ORAL_TABLET | Freq: Four times a day (QID) | ORAL | Status: DC | PRN

## 2020-05-29 MED ORDER — ONDANSETRON HCL 4 MG/2ML IJ SOLN
INTRAMUSCULAR | Status: AC
  Filled 2020-05-29: qty 2

## 2020-05-29 MED ORDER — ONDANSETRON HCL 4 MG/2ML IJ SOLN
INTRAMUSCULAR | Status: DC | PRN
  Administered 2020-05-29: 4 mg via INTRAVENOUS

## 2020-05-29 MED ORDER — ONDANSETRON HCL 4 MG/2ML IJ SOLN: 4.0000 mg | Freq: Once | INTRAMUSCULAR | Status: DC | PRN

## 2020-05-29 MED ORDER — DEXAMETHASONE SODIUM PHOSPHATE 10 MG/ML IJ SOLN
INTRAMUSCULAR | Status: AC
  Filled 2020-05-29: qty 1

## 2020-05-29 MED ORDER — MIDAZOLAM HCL 5 MG/5ML IJ SOLN
INTRAMUSCULAR | Status: DC | PRN
  Administered 2020-05-29: 2 mg via INTRAVENOUS

## 2020-05-29 MED ORDER — MIDAZOLAM HCL 2 MG/2ML IJ SOLN
INTRAMUSCULAR | Status: AC
  Filled 2020-05-29: qty 2

## 2020-05-29 MED ORDER — DEXAMETHASONE SODIUM PHOSPHATE 10 MG/ML IJ SOLN
INTRAMUSCULAR | Status: DC | PRN
  Administered 2020-05-29: 10 mg via INTRAVENOUS

## 2020-05-29 MED ORDER — HEPARIN SODIUM (PORCINE) 5000 UNIT/ML IJ SOLN
INTRAMUSCULAR | Status: AC
  Filled 2020-05-29: qty 1

## 2020-05-29 MED ORDER — PROPOFOL 10 MG/ML IV BOLUS
INTRAVENOUS | Status: DC | PRN
  Administered 2020-05-29: 150 mg via INTRAVENOUS
  Administered 2020-05-29: 50 mg via INTRAVENOUS

## 2020-05-29 MED ORDER — BUPIVACAINE HCL (PF) 0.5 % IJ SOLN
INTRAMUSCULAR | Status: AC
  Filled 2020-05-29: qty 30

## 2020-05-29 MED ORDER — SODIUM CHLORIDE (PF) 0.9 % IJ SOLN
INTRAMUSCULAR | Status: AC
  Filled 2020-05-29: qty 50

## 2020-05-29 MED ORDER — MIDAZOLAM HCL 2 MG/2ML IJ SOLN
INTRAMUSCULAR | Status: AC | PRN
  Administered 2020-05-29: 1 mg via INTRAVENOUS

## 2020-05-29 MED ORDER — BUPIVACAINE HCL (PF) 0.5 % IJ SOLN
INTRAMUSCULAR | Status: DC | PRN
  Administered 2020-05-29: 10 mL

## 2020-05-29 MED ORDER — FENTANYL CITRATE (PF) 100 MCG/2ML IJ SOLN
INTRAMUSCULAR | Status: AC
  Filled 2020-05-29: qty 2

## 2020-05-29 MED ORDER — LIDOCAINE HCL (PF) 1 % IJ SOLN
INTRAMUSCULAR | Status: DC | PRN
  Administered 2020-05-29: 10 mL

## 2020-05-29 MED ORDER — FENTANYL CITRATE (PF) 100 MCG/2ML IJ SOLN
INTRAMUSCULAR | Status: AC | PRN
Start: 2020-05-29 — End: 2020-05-29
  Administered 2020-05-29: 50 ug via INTRAVENOUS

## 2020-05-29 MED ORDER — OXYCODONE HCL 5 MG PO TABS
5.0000 mg | ORAL_TABLET | ORAL | Status: DC | PRN
  Administered 2020-05-30 – 2020-05-31 (×5): 5 mg via ORAL
  Filled 2020-05-29 (×5): qty 1

## 2020-05-29 MED ORDER — LIDOCAINE HCL (PF) 1 % IJ SOLN
INTRAMUSCULAR | Status: AC
  Filled 2020-05-29: qty 30

## 2020-05-29 MED ORDER — LIDOCAINE HCL (PF) 2 % IJ SOLN
INTRAMUSCULAR | Status: AC
  Filled 2020-05-29: qty 5

## 2020-05-29 MED ORDER — EPHEDRINE 5 MG/ML INJ
INTRAVENOUS | Status: AC
  Filled 2020-05-29: qty 10

## 2020-05-29 MED ORDER — LIDOCAINE HCL (PF) 2 % IJ SOLN
INTRAMUSCULAR | Status: DC | PRN
  Administered 2020-05-29: 50 mg

## 2020-05-29 MED ORDER — EPHEDRINE SULFATE 50 MG/ML IJ SOLN
INTRAMUSCULAR | Status: DC | PRN
  Administered 2020-05-29: 10 mg via INTRAVENOUS

## 2020-05-29 MED ORDER — FENTANYL CITRATE (PF) 100 MCG/2ML IJ SOLN
INTRAMUSCULAR | Status: DC | PRN
  Administered 2020-05-29: 50 ug via INTRAVENOUS
  Administered 2020-05-29 (×2): 25 ug via INTRAVENOUS

## 2020-05-29 MED ORDER — SODIUM CHLORIDE 0.9 % IV SOLN
INTRAVENOUS | Status: DC | PRN
  Administered 2020-05-29: 50 mL via INTRAMUSCULAR

## 2020-05-29 MED ORDER — 0.9 % SODIUM CHLORIDE (POUR BTL) OPTIME
TOPICAL | Status: DC | PRN
  Administered 2020-05-29: 500 mL

## 2020-05-29 MED ORDER — GLYCOPYRROLATE 0.2 MG/ML IJ SOLN
INTRAMUSCULAR | Status: AC
  Filled 2020-05-29: qty 1

## 2020-05-29 SURGICAL SUPPLY — 34 items
ADH SKN CLS APL DERMABOND .7 (GAUZE/BANDAGES/DRESSINGS) ×1
APL PRP STRL LF DISP 70% ISPRP (MISCELLANEOUS) ×1
BAG DECANTER FOR FLEXI CONT (MISCELLANEOUS) ×3 IMPLANT
BLADE SURG SZ11 CARB STEEL (BLADE) ×3 IMPLANT
CANISTER SUCT 1200ML W/VALVE (MISCELLANEOUS) ×3 IMPLANT
CHLORAPREP W/TINT 26 (MISCELLANEOUS) ×3 IMPLANT
COVER LIGHT HANDLE STERIS (MISCELLANEOUS) ×6 IMPLANT
COVER WAND RF STERILE (DRAPES) ×3 IMPLANT
DECANTER SPIKE VIAL GLASS SM (MISCELLANEOUS) ×9 IMPLANT
DERMABOND ADVANCED (GAUZE/BANDAGES/DRESSINGS) ×2
DERMABOND ADVANCED .7 DNX12 (GAUZE/BANDAGES/DRESSINGS) ×1 IMPLANT
DRAPE C-ARM XRAY 36X54 (DRAPES) ×3 IMPLANT
ELECT REM PT RETURN 9FT ADLT (ELECTROSURGICAL) ×3
ELECTRODE REM PT RTRN 9FT ADLT (ELECTROSURGICAL) ×1 IMPLANT
GLOVE SURG SYN 7.0 (GLOVE) ×3 IMPLANT
GLOVE SURG SYN 7.5  E (GLOVE) ×2
GLOVE SURG SYN 7.5 E (GLOVE) ×1 IMPLANT
GOWN STRL REUS W/ TWL LRG LVL3 (GOWN DISPOSABLE) ×2 IMPLANT
GOWN STRL REUS W/TWL LRG LVL3 (GOWN DISPOSABLE) ×6
IV NS 500ML (IV SOLUTION) ×3
IV NS 500ML BAXH (IV SOLUTION) ×1 IMPLANT
KIT PORT POWER 8FR ISP CVUE (Port) ×3 IMPLANT
KIT TURNOVER KIT A (KITS) ×3 IMPLANT
LABEL OR SOLS (LABEL) ×3 IMPLANT
NEEDLE FILTER BLUNT 18X 1/2SAF (NEEDLE) ×2
NEEDLE FILTER BLUNT 18X1 1/2 (NEEDLE) ×1 IMPLANT
NEEDLE HYPO 22GX1.5 SAFETY (NEEDLE) ×3 IMPLANT
PACK PORT-A-CATH (MISCELLANEOUS) ×3 IMPLANT
SUT MNCRL AB 4-0 PS2 18 (SUTURE) ×3 IMPLANT
SUT PROLENE 3 0 SH DA (SUTURE) ×3 IMPLANT
SUT VIC AB 3-0 SH 27 (SUTURE) ×3
SUT VIC AB 3-0 SH 27X BRD (SUTURE) ×1 IMPLANT
SYR 10ML LL (SYRINGE) ×3 IMPLANT
SYR 3ML LL SCALE MARK (SYRINGE) ×3 IMPLANT

## 2020-05-29 NOTE — Anesthesia Postprocedure Evaluation (Signed)
Anesthesia Post Note  Patient: Anita Sullivan  Procedure(s) Performed: INSERTION PORT-A-CATH (Left Chest)  Patient location during evaluation: PACU Anesthesia Type: General Level of consciousness: awake and alert Pain management: pain level controlled Vital Signs Assessment: post-procedure vital signs reviewed and stable Respiratory status: spontaneous breathing and respiratory function stable Cardiovascular status: stable Anesthetic complications: no   No complications documented.   Last Vitals:  Vitals:   05/29/20 1630 05/29/20 1645  BP: 118/70 115/67  Pulse: 77 83  Resp: 19 19  Temp:  (!) 36 C  SpO2: 100% 100%    Last Pain:  Vitals:   05/29/20 1645  TempSrc:   PainSc: 0-No pain                 Kendal Raffo K

## 2020-05-29 NOTE — Progress Notes (Signed)
PROGRESS NOTE  Anita Sullivan IEP:[id number] DOB: 27-Mar-1966 DOA: 05/22/2020 PCP: Volney American, PA-C  Brief History   Anita Iversen Uribeis a 54 y.o.femaleadmitted last night with rectal bleeding. Ongoing for 3 months on and off.The emergency room her hemoglobin was noted to be 6.4 g with an MCV of 75.8. Creatinine of 9.82 and CO2 of 16. Large amount of blood and leukocytes in the urine.  CT scan of the abdomen and pelvis that demonstrated marked severe sigmoid colitis numerous, numerous areas of soft tissue nodularity within the mesentery suggestive of an underlying neoplastic process that cannot be excluded heterogeneous liver masses noted suspicious for a neoplasm. Moderately severe Faera aortic and aortocaval lymphadenopathy noted marked severe bilateral hydronephrosis and hydroureter representing partial renal obstruction. High concern for extensive malignancy. Patient has quite a bit of unintentional weight loss recently. No family history of malignancy.  Multiple specialities were consulted this morning which include GI, urology, nephrology and oncology. She is diagnosed with stage IV colorectal cancer by oncology. Chemotherapy is to start next week. She underwent emergent bilateral nephrostomy tube placement by IR and draining well now.  The patient underwent flexible sigmoidoscopy today which provided visual confirmation of the mass. Hemostasis was obtained and biopsies taken. The patient was returned to her room on a soft diet.   Plan is for the patient to go to surgery later today for port placement.   Consultants   Gastroenterology  General surgery  Oncology  Procedures   Flexible Sigmoidoscopy  IR for placement of bilateral nephrostomy tubes.  Liver biopsy with ultrasound  Port placement  Antibiotics   Anti-infectives (From admission, onward)   Start     Dose/Rate Route Frequency Ordered Stop   05/29/20 0600  [MAR Hold]  ceFAZolin (ANCEF) IVPB  2g/100 mL premix        (MAR Hold since Thu 05/29/2020 at 1310.Hold Reason: Transfer to a Procedural area.)   2 g 200 mL/hr over 30 Minutes Intravenous On call to O.R. 05/28/20 1248 05/30/20 0559   05/28/20 1100  ceFAZolin (ANCEF) IVPB 2g/100 mL premix  Status:  Discontinued        2 g 200 mL/hr over 30 Minutes Intravenous On call to O.R. 05/28/20 1010 05/28/20 1247   05/23/20 1100  ceFAZolin (ANCEF) IVPB 2g/100 mL premix        2 g 200 mL/hr over 30 Minutes Intravenous To Radiology 05/23/20 1046 05/24/20 1100   05/23/20 1049  ceFAZolin (ANCEF) 2-4 GM/100ML-% IVPB       Note to Pharmacy: Genelle Bal   : cabinet override      05/23/20 1049 05/23/20 2259      Subjective  The patient is resting comfortably. Some bleeding overnight, but far less than before.   Objective   Vitals:  Vitals:   05/29/20 1126 05/29/20 1312  BP: 115/71 110/60  Pulse: 88 80  Resp: 20   Temp: 99 F (37.2 C) (!) 97 F (36.1 C)  SpO2: 100% 99%    Exam:  Constitutional:   The patient is awake, alert, and oriented x 3. No acute distress. Respiratory:   No increased work of breathing.  No wheezes, rales, or rhonchi  No tactile fremitus Cardiovascular:   Regular rate and rhythm  No murmurs, ectopy, or gallups.  No lateral PMI. No thrills. Abdomen:   Abdomen is soft, non-tender, non-distended  No hernias, masses, or organomegaly  Normoactive bowel sounds.   Nephrostomy tubes draining yellow fluid. Musculoskeletal:   No cyanosis, clubbing,  or edema Skin:   No rashes, lesions, ulcers  palpation of skin: no induration or nodules Neurologic:   CN 2-12 intact  Sensation all 4 extremities intact Psychiatric:   Mental status o Mood, affect appropriate o Orientation to person, place, time   judgment and insight appear intact  I have personally reviewed the following:   Today's Data   Vitals, BMP, CBC  Micro Data   Urine culture: No growth  Imaging   CT abdomen and  pelvis  CT chest  Scheduled Meds:  [MAR Hold] calcium acetate  667 mg Oral TID WC   [MAR Hold] pantoprazole (PROTONIX) IV  40 mg Intravenous Q12H   [MAR Hold] potassium chloride  20 mEq Oral Once   [MAR Hold] sodium bicarbonate  650 mg Oral TID   Continuous Infusions:  sodium chloride     [MAR Hold]  ceFAZolin (ANCEF) IV      Principal Problem:   GI bleed Active Problems:   Acute renal failure (HCC)   Obstructive uropathy   Colitis, acute   Bleeding per rectum   Rectal bleed   Colorectal cancer, stage IV (HCC)   Palliative care encounter   LOS: 7 days   A & P  Lower GI bleed: Due to Stage IV Colorectal Cancer. S/P Flexible sigmoidoscopy today with hemostasis achieved after ongoing bleeding overnight. Rectal mass 2 cm vrom anal verge.  Patient had unintentional weight loss and intermittent rectal bleeding going on for about a year now. She has received total of 3 units of PRBC.  Also received IV iron.  Hemoglobin is down from 9.3 to 8.7 today. Monitor and transfuse for hemoglobin less than 8.0. Oncology is following-appreciate their recommendations.  Anemia panel with anemia of chronic disease, no significant iron deficiency. 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 She will go for port placement later today. Plan is for chemotherapy to start next week.  Acute kidney injury: Most likely secondary to obstructive uropathy as evident on CT abdomen. Urology was also consulted and according to their advice IR was consulted for bilateral emergent nephrostomy tube placement which was done yesterday. Patient draining well from nephrostomy tubes, both bags with some blood tinted urine one more than the other. Urology signed off -will need replacement of nephrostomy tubes in 4 to 6 weeks by IR. Nephrology recommendations are appreciate. She was started on calcium binders by nephrology yesterday. Creatinine started trending down, it was 2.19 with BUN of  18 today.  Bicarb at 28. Continue to encourage PO hydration. Continue oral sodium bicarbonate. Monitor creatinine, electrolytes, and volume status. Avoid nephrotoxins and hypotension.  Anion gap metabolic acidosis: Resolved.  Anion gap resolved.  Lactic acid within normal limit. Most likely secondary to severe acute AKI and GI bleed.  Bicarb started improving and normalized.  I have seen and examined this patient myself. I have spent 32 minutes in her evaluation and care.  Grayland Daisey, DO [facility] Direct contact: see [email or web address]  7PM-7AM contact night coverage as above 05/29/2020, 2:37 PM  LOS: 6 days

## 2020-05-29 NOTE — Progress Notes (Signed)
[facility] Day(s): 7.   Post op day(s): 1 Day Post-Op.   Interval History:  Patient seen and examined No acute events or new complaints overnight.  Patient reports she is doing well, anxious to resume diet after procedure No fever, chills, nausea, emesis She did have a large BM with morning with some blood mixed in, no gross bleeding Hgb has remained stable, 8.5 this morning Renal function has leveled off some, sCr - 2.08 (overall improved from presentation); UO - 400 ccs + unmeasured No significant electrolyte derangements Pathology Reviewed:   - Colonoscopy: Invasive Adenocarcinoma  - Liver Biopsy: Metastatic Adenocarcinoma, colorectal primary  Plan for port placement with Dr Hampton Abbot this afternoon   Vital signs in last 24 hours: [min-max] current  Temp:  [97.3 F (36.3 C)-99.1 F (37.3 C)] 98.7 F (37.1 C) (10/28 0527) Pulse Rate:  [76-102] 89 (10/28 0527) Resp:  [16-23] 16 (10/28 0527) BP: (98-122)/(55-86) 98/61 (10/28 0527) SpO2:  [96 %-100 %] 96 % (10/28 0527) Weight:  [59 kg] 59 kg (10/27 1047)     Height: 5' (152.4 cm) Weight: 59 kg BMI (Calculated): 25.39   Intake/Output last 2 shifts:  10/27 0701 - 10/28 0700 In: 198.2 [IV Piggyback:198.2] Out: 400 [Urine:400]   Physical Exam:  Constitutional: alert, cooperative and no distress  HENT: normocephalic without obvious abnormality  Eyes: PERRL, EOM's grossly intact and symmetric  Respiratory: breathing non-labored at rest  Cardiovascular: regular rate and sinus rhythm  Gastrointestinal: Soft, non-tender, and non-distended, no rebound/guarding. Nephrostomy tubes bilaterally Musculoskeletal: No edema or wounds, motor and sensation grossly intact, NT    Labs:  CBC Latest Ref Rng & Units 05/29/2020 05/28/2020 05/27/2020  WBC 4.0 - 10.5 K/uL 8.9 8.1 9.8  Hemoglobin 12.0 - 15.0 g/dL 8.5(L) 8.7(L) 9.3(L)  Hematocrit 36 - 46 % 26.9(L) 26.7(L) 28.8(L)  Platelets 150  - 400 K/uL 320 341 423(H)   CMP Latest Ref Rng & Units 05/29/2020 05/29/2020 05/28/2020  Glucose 70 - 99 mg/dL 102(H) 105(H) 105(H)  BUN 6 - 20 mg/dL 16 16 18   Creatinine 0.44 - 1.00 mg/dL 2.15(H) 2.08(H) 2.19(H)  Sodium 135 - 145 mmol/L 140 141 139  Potassium 3.5 - 5.1 mmol/L 3.4(L) 3.5 3.3(L)  Chloride 98 - 111 mmol/L 103 105 102  CO2 22 - 32 mmol/L 28 28 28   Calcium 8.9 - 10.3 mg/dL 8.1(L) 8.2(L) 8.3(L)  Total Protein 6.5 - 8.1 g/dL - 6.8 -  Total Bilirubin 0.3 - 1.2 mg/dL - 0.5 -  Alkaline Phos 38 - 126 U/L - 43 -  AST 15 - 41 U/L - 19 -  ALT 0 - 44 U/L - 7 -     Imaging studies: No new pertinent imaging studies   Assessment/Plan: (ICD-10's: K62.89) 54 y.o.femalepresenting with bright red blood per rectum, found to have patrtially obstructing ulcerated rectal mass approximately 2 cm from the anal verge, concerning for metastatic neoplasm given concomitant liver masses (biopsied, + for metastatic adenocarcinoma, colorectal primary),ara-aortic and aortocavallymphadenopathy, and hydronephrosis s/p bilateral percutaneous nephrostomy tube placement on 10/22.    - We will plan on port-a-catheter this afternoon with Dr Hampton Abbot pending OR/Anesthesia availability             - All risks, benefits, and alternatives to above procedure(s) were discussed with the patient, all of her questions were answered to her expressed satisfaction, patient expresses she wishes to proceed, and informed consent was obtained.              -  NPO for OR; okay to resume diet post-op             - IV Abx (Ancef); on call to OR  - Monitor rectal bleeding &H&H; transfuse as needed              - Monitor abdominal examination; on-going bowel function  - Nephrology following; no need for renal replacement at this time; continue PCNs; monitor and record output             - Appreciate oncology assistance - Mobilization encouraged - Further manage per primary  service; we will follow   All of the above findings and recommendations were discussed with the patient, and the medical team, and all of patient's questions were answered to her expressed satisfaction.  -- Edison Simon, PA-C [facility] 05/29/2020, 8:06 AM [phone] M-F: 7am - 4pm

## 2020-05-29 NOTE — Procedures (Signed)
  Procedure: LEFT 62f chest tube placement under CT   EBL:   minimal Complications:  none immediate  See full dictation in [facility].  Dillard Cannon MD Main # [phone] Pager  [phone] Mobile [phone]

## 2020-05-29 NOTE — Transfer of Care (Signed)
Immediate Anesthesia Transfer of Care Note  Patient: Anita Sullivan  Procedure(s) Performed: INSERTION PORT-A-CATH (Left Chest)  Patient Location: PACU  Anesthesia Type:General  Level of Consciousness: sedated  Airway & Oxygen Therapy: Patient Spontanous Breathing and Patient connected to face mask oxygen  Post-op Assessment: Report given to RN and Post -op Vital signs reviewed and stable  Post vital signs: Reviewed  Last Vitals:  Vitals Value Taken Time  BP    Temp    Pulse    Resp    SpO2      Last Pain:  Vitals:   05/29/20 1312  TempSrc: Temporal  PainSc: 0-No pain         Complications: No complications documented.

## 2020-05-29 NOTE — Consult Note (Signed)
[place]  Telephone:([phone] [phone]   Name: Anita Sullivan Date: 05/29/2020 MRN: [id number]  DOB: 05/13/1966  Patient Care Team: Volney American, PA-C as PCP - General (Family Medicine) [name], MD as Consulting Physician (Oncology)    REASON FOR CONSULTATION: Anita Sullivan is a 54 y.o. female with no significant previous medical history who was admitted to the hospital on 05/22/2020 with complaints of intermittent rectal bleeding over the past 3 months.  Patient was found to have severe anemia and elevated serum creatinine.  CT of the abdomen and pelvis on 10/21 revealed severe sigmoid colitis with numerous soft tissue nodularity, multiple liver masses, severe periaortic lymphadenopathy concerning for metastatic disease.  Patient was also found to have bilateral hydronephrosis and is status post nephrostomy tubes.  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  Patient is status post flex sig on 05/28/2020 due to persistent rectal bleeding.  She underwent hemostasis procedure and biopsies were taken.  Palliative care was consulted help address goals.  SOCIAL HISTORY:     reports that she has never smoked. She has never used smokeless tobacco. She reports that she does not drink alcohol and does not use drugs.  Patient is divorced.  She lives at home with her father.  She has a son and daughter who are involved in her care.  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  ADVANCE DIRECTIVES:  None on file  CODE STATUS: Full code  PAST MEDICAL HISTORY:History reviewed. No pertinent past medical history.  PAST SURGICAL HISTORY:  Past Surgical History:  Procedure Laterality Date  . COLONOSCOPY WITH PROPOFOL N/A 05/27/2020   Procedure: COLONOSCOPY WITH PROPOFOL;  Surgeon:  [name], MD;  Location: [facility] ENDOSCOPY;  Service: Gastroenterology;  Laterality: N/A;  . IR NEPHROSTOMY PLACEMENT LEFT  05/23/2020  . IR NEPHROSTOMY PLACEMENT RIGHT  05/23/2020    HEMATOLOGY/ONCOLOGY HISTORY:  Oncology History   No history exists.    ALLERGIES:  has No Known Allergies.  MEDICATIONS:  Current Facility-Administered Medications  Medication Dose Route Frequency Provider Last Rate Last Admin  . 0.9 %  sodium chloride infusion   Intravenous Continuous Vanga, Tally Due, MD      . calcium acetate (PHOSLO) capsule 667 mg  667 mg Oral TID WC Bhutani, Manpreet S, MD   667 mg at 05/28/20 1735  . ceFAZolin (ANCEF) IVPB 2g/100 mL premix  2 g Intravenous On Call to Fisher, MD      . ondansetron (ZOFRAN) tablet 4 mg  4 mg Oral Q6H PRN Elwyn Reach, MD       Or  . ondansetron (ZOFRAN) injection 4 mg  4 mg Intravenous Q6H PRN Elwyn Reach, MD   4 mg at 05/29/20 0335  . pantoprazole (PROTONIX) injection 40 mg  40 mg Intravenous Q12H Elwyn Reach, MD   40 mg at 05/28/20 2106  . potassium chloride SA (KLOR-CON) CR tablet 20 mEq  20 mEq Oral Once Lorella Nimrod, MD      . sodium bicarbonate tablet 650 mg  650 mg Oral TID Ulice Bold S, MD   650 mg at 05/28/20 2111    VITAL SIGNS: BP 98/61 (BP Location: Left Arm)   Pulse 89   Temp 98.7 F (37.1 C) (Oral)   Resp 16   Ht 5' (1.524 m)   Wt 130  lb (59 kg)   SpO2 96%   BMI 25.39 kg/m  Filed Weights   05/22/20 1537 05/27/20 1108 05/28/20 1047  Weight: 130 lb (59 kg) 130 lb 1.1 oz (59 kg) 130 lb (59 kg)    Estimated body mass index is 25.39 kg/m as calculated from the following:   Height as of this encounter: 5' (1.524 m).   Weight as of this encounter: 130 lb (59 kg).  LABS: CBC:    Component Value Date/Time   WBC 8.9 05/29/2020 0612   HGB 8.5 (L) 05/29/2020 0612   HGB 13.0 09/04/2018 1434   HCT 26.9 (L) 05/29/2020 0612   HCT 37.4 09/04/2018 1434   PLT 320 05/29/2020 0612   PLT  331 09/04/2018 1434   MCV 83.3 05/29/2020 0612   MCV 92 09/04/2018 1434   NEUTROABS 6.6 05/29/2020 0612   NEUTROABS 5.2 09/04/2018 1434   LYMPHSABS 1.4 05/29/2020 0612   LYMPHSABS 1.9 09/04/2018 1434   MONOABS 0.7 05/29/2020 0612   EOSABS 0.2 05/29/2020 0612   EOSABS 0.2 09/04/2018 1434   BASOSABS 0.0 05/29/2020 0612   BASOSABS 0.0 09/04/2018 1434   Comprehensive Metabolic Panel:    Component Value Date/Time   NA 140 05/29/2020 0613   K 3.4 (L) 05/29/2020 0613   CL 103 05/29/2020 0613   CO2 28 05/29/2020 0613   BUN 16 05/29/2020 0613   CREATININE 2.15 (H) 05/29/2020 0613   GLUCOSE 102 (H) 05/29/2020 0613   CALCIUM 8.1 (L) 05/29/2020 0613   AST 19 05/29/2020 0612   ALT 7 05/29/2020 0612   ALKPHOS 43 05/29/2020 0612   BILITOT 0.5 05/29/2020 0612   PROT 6.8 05/29/2020 0612   ALBUMIN 2.6 (L) 05/29/2020 0613    RADIOGRAPHIC STUDIES: CT ABDOMEN PELVIS WO CONTRAST  Result Date: 05/22/2020 CLINICAL DATA:  Intermittent rectal bleeding. EXAM: CT ABDOMEN AND PELVIS WITHOUT CONTRAST TECHNIQUE: Multidetector CT imaging of the abdomen and pelvis was performed following the standard protocol without IV contrast. COMPARISON:  None. FINDINGS: Lower chest: No acute abnormality. Hepatobiliary: A 2.9 cm x 3.1 cm ill-defined area of heterogeneous low attenuation is seen within the lateral aspect of the liver dome (axial CT image 17, CT series number 2). An ill-defined, slightly exophytic 6.9 cm x 5.0 cm area of heterogeneous mildly decreased attenuation is seen within the posterior aspect of the right lobe of the liver. An ill-defined central area of low attenuation is noted within this region. No gallstones, gallbladder wall thickening, or biliary dilatation. Pancreas: Unremarkable. No pancreatic ductal dilatation or surrounding inflammatory changes. Spleen: Normal in size without focal abnormality. Adrenals/Urinary Tract: Adrenal glands are unremarkable. Kidneys are normal in size, without focal  lesions. Marked severity bilateral hydronephrosis and hydroureter is seen. The urinary bladder is partially contracted and subsequently limited in evaluation. Stomach/Bowel: Stomach is within normal limits. Appendix appears normal. No evidence of bowel dilatation. There is marked severity thickening of the mid and distal sigmoid colon associated pericolonic inflammatory fat stranding is noted. Vascular/Lymphatic: No significant vascular findings are present. There is moderate severity para-aortic and aortocaval lymphadenopathy. The largest lymph node measures approximately 1.7 cm x 1.7 cm along the para-aortic region at the level of the kidney. It should be noted that this area is limited in evaluation in the absence of intravenous contrast. A 2.8 cm x 2.4 cm heterogeneous soft tissue mass is seen along the posterior aspect of the distal left iliac vessels (axial CT images 68 through 74, CT series number 2). Numerous subcentimeter soft  tissue nodules are seen within the mesentery of the pelvis, along the midline (axial CT images 58 through 66, CT series number 2). Numerous additional small soft tissue nodules are seen surrounding the distal sigmoid colon and rectosigmoid junction. Reproductive: The uterus is mildly enlarged. The uterine fundus is positioned to the left of midline within the anterior aspect of the pelvis. Other: No abdominal wall hernia or abnormality. A small amount of abdominal and pelvic fluid is noted. Musculoskeletal: No acute or significant osseous findings. IMPRESSION: 1. Marked severity sigmoid colitis. Due to the numerous areas of soft tissue nodularity within the mesentery in this region, an underlying neoplastic process cannot be excluded. Correlation with colonoscopy is recommended. 2. Heterogeneous liver masses, as described above, suspicious for an underlying neoplasm. MRI correlation is recommended. 3. Moderate severity para-aortic and aortocaval lymphadenopathy with an additional  enlarged, irregular appearing lymph node seen along the posterior aspect of the distal left iliac vessels. This is also worrisome for sequelae associated with an underlying neoplasm. 4. Marked severity bilateral hydronephrosis and hydroureter which likely represents partial renal obstruction secondary to the inflammatory and suspected neoplastic process seen within the pelvis. Electronically Signed   By: Virgina Norfolk M.D.   On: 05/22/2020 20:26   CT CHEST WO CONTRAST  Result Date: 05/27/2020 CLINICAL DATA:  New diagnosis of rectal cancer. EXAM: CT CHEST WITHOUT CONTRAST TECHNIQUE: Multidetector CT imaging of the chest was performed following the standard protocol without IV contrast. COMPARISON:  Abdominal CT 05/22/2020 FINDINGS: Cardiovascular: Normal aortic caliber. Normal heart size, without pericardial effusion. Mediastinum/Nodes: No supraclavicular adenopathy. No mediastinal or definite hilar adenopathy, given limitations of unenhanced CT. Lungs/Pleura: No pleural fluid. Left base scarring or subsegmental atelectasis. Nonspecific 2 mm right upper lobe pulmonary nodule on 29/3. Calcified granuloma of 2 mm in the right lower lobe on 85/3. Upper Abdomen: Subtle hypoattenuating hepatic dome lesion again identified. The dominant right hepatic lobe mass is excluded. Normal imaged portions of the spleen, stomach, pancreas, adrenal glands, right kidney. Musculoskeletal: Presumed bone island within the sternal body. IMPRESSION: 1.  No acute process or evidence of metastatic disease in the chest. 2. Nonspecific tiny right upper lobe pulmonary nodule. No thoracic adenopathy. 3. Liver masses, as before. Electronically Signed   By: Abigail Miyamoto M.D.   On: 05/27/2020 19:34   [place] Intraoperative  Result Date: 05/23/2020 INDICATION: Patient admitted with new diagnosis of malignancy of unknown etiology now with bilateral obstructive hydronephrosis. Request made for placement of bilateral percutaneous nephrostomy  catheters for urinary diversion purposes. EXAM: 1. ULTRASOUND GUIDANCE FOR PUNCTURE OF THE BILATERAL RENAL COLLECTING SYSTEMS 2. BILATERAL PERCUTANEOUS NEPHROSTOMY TUBE PLACEMENT. COMPARISON:  CT abdomen and pelvis-05/22/2020 MEDICATIONS: Ancef 2 gm IV; The antibiotic was administered in an appropriate time frame prior to skin puncture. ANESTHESIA/SEDATION: Moderate (conscious) sedation was employed during this procedure. A total of Versed 4 mg and Fentanyl 100 mcg was administered intravenously. Moderate Sedation Time: 22 minutes. The patient's level of consciousness and vital signs were monitored continuously by radiology nursing throughout the procedure under my direct supervision. CONTRAST:  A total of 20 cc Visipaque 320 was administered into both renal collecting systems. FLUOROSCOPY TIME:  1 minute, 42 seconds (76.1 mGy) COMPLICATIONS: None immediate. PROCEDURE: The procedure, risks, benefits, and alternatives were explained to the patient. Questions regarding the procedure were encouraged and answered. The patient understands and consents to the procedure. A timeout was performed prior to the initiation of the procedure. The bilateral flanks were prepped and draped in the usual  sterile fashion and a sterile drape was applied covering the operative field. A sterile gown and sterile gloves were used for the procedure. Local anesthesia was provided with 1% Lidocaine with epinephrine. Attention was first paid towards placement of the left-sided percutaneous nephrostomy catheter. Ultrasound was used to localize the left kidney. Under direct ultrasound guidance, a 20 gauge needle was advanced into the renal collecting system. An ultrasound image documentation was performed. Access within the collecting system was confirmed with the efflux of urine followed by limited contrast injection. Over a Nitrex wire, the tract was dilated with an Accustick stent. Next, under intermittent fluoroscopic guidance and over a  short Amplatz wire, the track was dilated ultimately allowing placement of a 10-French percutaneous nephrostomy catheter which was advanced to the level of the renal pelvis where the coil was formed and locked. Contrast was injected and several spot fluoroscopic images were obtained in various obliquities. The catheter was secured at the skin with a Prolene retention suture and stat lock device and connected to a gravity bag was placed. The identical procedure was repeated for the contralateral right kidney allowing placement of a 10 French percutaneous nephrostomy catheter with end ultimately coiled and locked within the right renal pelvis. Dressings were applied. The patient tolerated procedure well without immediate postprocedural complication. 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 Under a combination of ultrasound and fluoroscopic guidance, a posterior inferior calix was targeted bilaterally allowing placement of bilateral 10 French percutaneous nephrostomy catheters with ends coiled and locked within the bilateral renal pelvises. Contrast injection confirmed appropriate positioning. IMPRESSION: Successful ultrasound and fluoroscopic guided placement of bilateral 10 French PCNs. Electronically Signed   By: Sandi Mariscal M.D.   On: 05/23/2020 12:55   US RENAL  Result Date: 05/23/2020 CLINICAL DATA:  Acute kidney injury EXAM: RENAL / URINARY TRACT ULTRASOUND COMPLETE COMPARISON:  CT 05/22/2020 FINDINGS: Right Kidney: Renal measurements: 11.5 x 5.2 x 5.5 cm = volume: 170 mL. Mild renal cortical thinning with increased cortical echogenicity. Severe right hydronephrosis. The visualized right ureter is moderately dilated. No discrete shadowing stone or mass lesion identified. Left Kidney: Renal measurements: 12.5 x 5.2 x 4.7 cm = volume: 162 mL. Cortical thickness  within normal limits. Increased renal cortical echogenicity. Moderate to severe hydronephrosis. Visualized left ureter is mildly dilated. No discrete shadowing stone or mass lesion identified. Bladder: Partially distended.  No definite abnormality. Other: Inferior right lower lobe solid intrahepatic mass measuring up to 6.0 cm. Irregular and heterogeneous appearance of the uterus is partially visualized on images within the pelvis, incompletely characterized. IMPRESSION: 1. Severe right hydroureteronephrosis. 2. Moderate to severe left hydronephrosis. 3. Increased renal cortical echogenicity bilaterally suggesting medical renal disease. 4. Very irregular appearance of visualized portion of the uterus noted on transabdominal imaging through the pelvis. Recommend dedicated pelvic ultrasound for further evaluation. 5. Solid 6.0 cm inferior right hepatic lobe mass most suggestive of metastatic disease based on the previous CT. Electronically Signed   By: Davina Poke D.O.   On: 05/23/2020 10:04   US BIOPSY (LIVER)  Result Date: 05/26/2020 INDICATION: Rectal mass with associated liver masses. EXAM: ULTRASOUND GUIDED CORE BIOPSY OF LIVER MEDICATIONS: None. ANESTHESIA/SEDATION: Fentanyl 1.0 mcg IV; Versed 50 mg IV Moderate Sedation Time:  15 minutes. The patient was continuously monitored during the procedure by the interventional radiology nurse under my direct supervision. PROCEDURE: The procedure, risks, benefits, and alternatives  were explained to the patient. Questions regarding the procedure were encouraged and answered. The patient understands and consents to the procedure. A time-out was performed prior to initiating the procedure. Ultrasound was performed to localize liver lesions. The abdominal wall was prepped with chlorhexidine in a sterile fashion, and a sterile drape was applied covering the operative field. A sterile gown and sterile gloves were used for the procedure. Local anesthesia was provided  with 1% Lidocaine. Under ultrasound guidance, a 27 gauge trocar needle was advanced into the liver at the level of a lesion within the right lobe. Three separate coaxial 18 gauge core biopsy samples were obtained with an 18 gauge device. Material was submitted in formalin. Gel-Foam pledgets were advanced through the outer needle as it was retracted and removed. Additional ultrasound was performed. COMPLICATIONS: None immediate. FINDINGS: The largest mass in the liver as noted by CT is within the inferior aspect of the right lobe. By ultrasound this lesion measures approximately 6.5 x 5.1 x 6.1 cm. Solid tissue was obtained. IMPRESSION: Ultrasound-guided core biopsy performed at the level of a 6.5 cm mass within the inferior aspect of the right lobe of the liver. Electronically Signed   By: Aletta Edouard M.D.   On: 05/26/2020 15:27   IR NEPHROSTOMY PLACEMENT LEFT  Result Date: 05/23/2020 INDICATION: Patient admitted with new diagnosis of malignancy of unknown etiology now with bilateral obstructive hydronephrosis. Request made for placement of bilateral percutaneous nephrostomy catheters for urinary diversion purposes. EXAM: 1. ULTRASOUND GUIDANCE FOR PUNCTURE OF THE BILATERAL RENAL COLLECTING SYSTEMS 2. BILATERAL PERCUTANEOUS NEPHROSTOMY TUBE PLACEMENT. COMPARISON:  CT abdomen and pelvis-05/22/2020 MEDICATIONS: Ancef 2 gm IV; The antibiotic was administered in an appropriate time frame prior to skin puncture. ANESTHESIA/SEDATION: Moderate (conscious) sedation was employed during this procedure. A total of Versed 4 mg and Fentanyl 100 mcg was administered intravenously. Moderate Sedation Time: 22 minutes. The patient's level of consciousness and vital signs were monitored continuously by radiology nursing throughout the procedure under my direct supervision. CONTRAST:  A total of 20 cc Visipaque 320 was administered into both renal collecting systems. FLUOROSCOPY TIME:  1 minute, 42 seconds (76.7 mGy)  COMPLICATIONS: None immediate. PROCEDURE: The procedure, risks, benefits, and alternatives were explained to the patient. Questions regarding the procedure were encouraged and answered. The patient understands and consents to the procedure. A timeout was performed prior to the initiation of the procedure. The bilateral flanks were prepped and draped in the usual sterile fashion and a sterile drape was applied covering the operative field. A sterile gown and sterile gloves were used for the procedure. Local anesthesia was provided with 1% Lidocaine with epinephrine. Attention was first paid towards placement of the left-sided percutaneous nephrostomy catheter. Ultrasound was used to localize the left kidney. Under direct ultrasound guidance, a 20 gauge needle was advanced into the renal collecting system. An ultrasound image documentation was performed. Access within the collecting system was confirmed with the efflux of urine followed by limited contrast injection. Over a Nitrex wire, the tract was dilated with an Accustick stent. Next, under intermittent fluoroscopic guidance and over a short Amplatz wire, the track was dilated ultimately allowing placement of a 10-French percutaneous nephrostomy catheter which was advanced to the level of the renal pelvis where the coil was formed and locked. Contrast was injected and several spot fluoroscopic images were obtained in various obliquities. The catheter was secured at the skin with a Prolene retention suture and stat lock device and connected to a gravity bag  was placed. The identical procedure was repeated for the contralateral right kidney allowing placement of a 10 French percutaneous nephrostomy catheter with end ultimately coiled and locked within the right renal pelvis. Dressings were applied. The patient tolerated procedure well without immediate postprocedural complication. FINDINGS: Ultrasound scanning demonstrates a moderate to severely dilatation of the  bilateral renal collecting systems, right greater than left, as demonstrated on preceding abdominal CT. Under a combination of ultrasound and fluoroscopic guidance, a posterior inferior calix was targeted bilaterally allowing placement of bilateral 10 French percutaneous nephrostomy catheters with ends coiled and locked within the bilateral renal pelvises. Contrast injection confirmed appropriate positioning. IMPRESSION: Successful ultrasound and fluoroscopic guided placement of bilateral 10 French PCNs. Electronically Signed   By: Sandi Mariscal M.D.   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 Request made for placement of bilateral percutaneous nephrostomy catheters for urinary diversion purposes. EXAM: 1. ULTRASOUND GUIDANCE FOR PUNCTURE OF THE BILATERAL RENAL COLLECTING SYSTEMS 2. BILATERAL PERCUTANEOUS NEPHROSTOMY TUBE PLACEMENT. COMPARISON:  CT abdomen and pelvis-05/22/2020 MEDICATIONS: Ancef 2 gm IV; The antibiotic was administered in an appropriate time frame prior to skin puncture. ANESTHESIA/SEDATION: Moderate (conscious) sedation was employed during this procedure. A total of Versed 4 mg and Fentanyl 100 mcg was administered intravenously. Moderate Sedation Time: 22 minutes. The patient's level of consciousness and vital signs were monitored continuously by radiology nursing throughout the procedure under my direct supervision. CONTRAST:  A total of 20 cc Visipaque 320 was administered into both renal collecting systems. FLUOROSCOPY TIME:  1 minute, 42 seconds (19.3 mGy) COMPLICATIONS: None immediate. PROCEDURE: The procedure, risks, benefits, and alternatives were explained to the patient. Questions regarding the procedure were encouraged and answered. The patient understands and consents to the procedure. A timeout was performed prior to  the initiation of the procedure. The bilateral flanks were prepped and draped in the usual sterile fashion and a sterile drape was applied covering the operative field. A sterile gown and sterile gloves were used for the procedure. Local anesthesia was provided with 1% Lidocaine with epinephrine. Attention was first paid towards placement of the left-sided percutaneous nephrostomy catheter. Ultrasound was used to localize the left kidney. Under direct ultrasound guidance, a 20 gauge needle was advanced into the renal collecting system. An ultrasound image documentation was performed. Access within the collecting system was confirmed with the efflux of urine followed by limited contrast injection. Over a Nitrex wire, the tract was dilated with an Accustick stent. Next, under intermittent fluoroscopic guidance and over a short Amplatz wire, the track was dilated ultimately allowing placement of a 10-French percutaneous nephrostomy catheter which was advanced to the level of the renal pelvis where the coil was formed and locked. Contrast was injected and several spot fluoroscopic images were obtained in various obliquities. The catheter was secured at the skin with a Prolene retention suture and stat lock device and connected to a gravity bag was placed. The identical procedure was repeated for the contralateral right kidney allowing placement of a 10 French percutaneous nephrostomy catheter with end ultimately coiled and locked within the right renal pelvis. Dressings were applied. The patient tolerated procedure well without immediate postprocedural complication. 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 Under a combination of ultrasound and fluoroscopic guidance, a posterior inferior  calix was targeted bilaterally allowing placement of bilateral 10 French percutaneous  nephrostomy catheters with ends coiled and locked within the bilateral renal pelvises. Contrast injection confirmed appropriate positioning. IMPRESSION: Successful ultrasound and fluoroscopic guided placement of bilateral 10 French PCNs. Electronically Signed   By: Sandi Mariscal M.D.   On: 05/23/2020 12:55    PERFORMANCE STATUS (ECOG) : 1 - Symptomatic but completely ambulatory  Review of Systems Unless otherwise noted, a complete review of systems is negative.  Physical Exam General: NAD Pulmonary: Unlabored Abdomen: soft, nontender, + bowel sounds GU: no suprapubic tenderness Extremities: no edema, no joint deformities Skin: no rashes Neurological: Weakness but otherwise nonfocal  IMPRESSION: I met with patient.  She was [place] by her aunt from [place].  I introduced palliative care services and attempted to establish therapeutic rapport.  Patient seems to be doing reasonably well today.  She underwent flex sig earlier today with plan for port placement tomorrow.  Patient is hungry and hopeful that she will be able to eat soon.  She describes her fears associated with rectal bleeding and is hopeful that the hemostatic procedure earlier today is effective at preventing future bleeding.  Patient seems to recognize the potential severity of the diagnosis but is in agreement with the current scope of treatment including work-up and follow-up at the [facility].  She describes having good social support at home from her father and children.  She still works at a [employer].  She plans to continue working for as long as possible and describes joy from helping the elderly.  Patient denies any distressing symptoms at present.  PLAN: -Continue current scope of treatment -Will plan to follow patient in the clinic after discharge from the hospital   Time Total: 30 minutes  Visit consisted of counseling and education dealing with the complex and emotionally intense issues  of symptom management and palliative care in the setting of serious and potentially life-threatening illness.Greater than 50%  of this time was spent counseling and coordinating care related to the above assessment and plan.  Signed by: Altha Harm, PhD, NP-C

## 2020-05-29 NOTE — TOC Initial Note (Signed)
Transition of Care [facility]) - Initial/Assessment Note    Patient Details  Name: Anita Sullivan MRN: [id number] Date of Birth: 1965/09/04  [facility]) CM/SW Contact:    Beverly Sessions, RN Phone Number: 05/29/2020, 5:12 PM  Clinical Narrative:                  Patient lives at home with father.  Prior to admission was not on any home medications.  PCP - [facility] - plans to continue to go there  Independent of mobility  Provided patient with packet including Summary of [place] [facility] [employer], Medication Management  And [facility]  Application, and Katherina Right number at the [employer]   Amy Shepard from Financial services to follow up tomorrow        Patient Goals and CMS Choice        Expected Discharge Plan and Services                                                Prior Living Arrangements/Services                       Activities of Daily Living Home Assistive Devices/Equipment: None ADL Screening (condition at time of admission) Patient's cognitive ability adequate to safely complete daily activities?: Yes Is the patient deaf or have difficulty hearing?: No Does the patient have difficulty seeing, even when wearing glasses/contacts?: No Does the patient have difficulty concentrating, remembering, or making decisions?: No Patient able to express need for assistance with ADLs?: Yes Does the patient have difficulty dressing or bathing?: No Independently performs ADLs?: Yes (appropriate for developmental age) Does the patient have difficulty walking or climbing stairs?: No Weakness of Legs: None Weakness of Arms/Hands: None  Permission Sought/Granted                  Emotional Assessment              Admission diagnosis:  Hydronephrosis [N13.30] Hypoalbuminemia [L38.10] Metabolic acidosis [F75.1] Uremia [N19] GI bleed [K92.2] Bleeding per rectum  [K62.5] Obstructive uropathy [N13.9] Liver mass [R16.0] Rectal bleed [K62.5] AKI (acute kidney injury) ([place]) [N17.9] Low hemoglobin [D64.9] Gastrointestinal hemorrhage associated with anorectal source [K62.5] Acute renal failure, unspecified acute renal failure type ([place]) [N17.9] Gastrointestinal hemorrhage, unspecified gastrointestinal hemorrhage type [K92.2] Malignant neoplasm of colon, unspecified part of colon ([place]) [C18.9] Patient Active Problem List   Diagnosis Date Noted  . Palliative care encounter   . Metastatic colorectal cancer ([place])   . Rectal bleed 05/25/2020  . Bleeding per rectum 05/24/2020  . Hydronephrosis   . Metabolic acidosis   . GI bleed 05/22/2020  . Acute renal failure ([place]) 05/22/2020  . Obstructive uropathy 05/22/2020  . Colitis, acute 05/22/2020   PCP:  Volney American, PA-C Pharmacy:   [facility] ([facility] [place] [address]) [place] [address] Phone: [phone] Fax: [phone]     Social Determinants of Health (SDOH) Interventions    Readmission Risk Interventions No flowsheet data found.

## 2020-05-29 NOTE — Progress Notes (Signed)
Mobility Specialist - Progress Note   05/29/20 1356  Mobility  Activity Contraindicated/medical hold  Mobility performed by Mobility specialist    Pt currently out of room for procedure. Will hold and re-attempt when pt is available.    Oryan Winterton Mobility Specialist  05/29/20, 1:56 PM

## 2020-05-29 NOTE — Op Note (Signed)
  Procedure Date:  05/29/2020  Pre-operative Diagnosis:  Metastatic colorectal cancer  Post-operative Diagnosis:  Metastatic colorectal cancer  Procedure:  Left internal jugular port-a-cath placement  Surgeon:  Melvyn Neth, MD  Anesthesia:  General endotracheal  Estimated Blood Loss:  5 ml  Specimens:  None  Complications:  None apparent  Indications for Procedure:  This is a 54 y.o. female who requires a port-a-cath for chemotherapy.  The risks of bleeding, infection, injury to surrounding structures, thrombosis, nonfunction, pneumothorax, hemothorax, and need for further procedures were discussed with the patient and was willing to proceed.  Description of Procedure: The patient was correctly identified in the preoperative area and brought into the operating room.  The patient was placed supine with VTE prophylaxis in place.  Appropriate time-outs were performed.  Anesthesia was induced and the patient was intubated.  Appropriate antibiotics were infused.  The left chest and neck were prepped and draped in usual sterile fashion. The patient was placed in Trendelenburg position. Using ultrasound guidance, the left subclavian vein was identified, but was narrow in caliber.  Needle insertion was attempted multiple times in the left subclavian but were unable to access the vein appropriately to be able to pass a wire into it.  Thus, it was decided to go to the left internal jugular vein instead.  Using the ultrasound again, the left IJ vein was identified and the large bore needle was placed into the IJ vein without difficulty and then the Seldinger wire was advanced. Fluoroscopy was utilized to confirm that the Seldinger wire was in the superior vena cava.  An incision was made and a port pocket developed with blunt and electrocautery dissection. The introducer dilator was placed over the Seldinger wire the wire was removed. The previously flushed catheter was placed into the introducer  dilator and the peel-away sheath was removed. The catheter length was confirmed and trimmed utilizing fluoroscopy for proper positioning. The catheter was then attached to the previously flushed port. The port was placed into the pocket. Fluoro was used to obtain a last image with the catheter in good position, without any kinks.  The port was secured in place with 3-0 Prolenes and flushed for function and heparin locked.   Local anesthetic was infiltrated into the subcutaneous tissue.  The wound was closed with interrupted 3-0 Vicryl followed by 4-0 subcuticular Monocryl sutures and sealed with DermaBond.  The patient was emerged from anesthesia and extubated and brought to the recovery room for further management.  A chest x-ray was ordered.  The patient tolerated the procedure well and all counts were correct at the end of the case.   Melvyn Neth, MD

## 2020-05-29 NOTE — Anesthesia Procedure Notes (Signed)
Procedure Name: LMA Insertion Performed by: Chaunice Obie, CRNA Pre-anesthesia Checklist: Patient identified, Patient being monitored, Timeout performed, Emergency Drugs available and Suction available Patient Re-evaluated:Patient Re-evaluated prior to induction Oxygen Delivery Method: Circle system utilized Preoxygenation: Pre-oxygenation with 100% oxygen Induction Type: IV induction Ventilation: Mask ventilation without difficulty LMA: LMA inserted LMA Size: 3.5 Tube type: Oral Number of attempts: 1 Placement Confirmation: positive ETCO2 and breath sounds checked- equal and bilateral Tube secured with: Tape Dental Injury: Teeth and Oropharynx as per pre-operative assessment        

## 2020-05-29 NOTE — Progress Notes (Signed)
Dr Hampton Abbot made aware of pneumothorax. Will repeat cray at 6p,

## 2020-05-29 NOTE — Progress Notes (Signed)
[facility]  ROUNDING NOTE   Subjective:   Patient resting in bed, in no acute distress.  She is pleasant and cooperative, reports she is n.p.o. again today for the procedure, possibly port placement.  Patient had colonoscopy and liver biopsy recently, which reveal invasive adenocarcinoma, colorectal primary. Patient's renal function getting progressively better.  Bilateral nephrostomy tubes draining clear yellow urine output.  Objective:  Vital signs in last 24 hours:  Temp:  [98.2 F (36.8 C)-99.1 F (37.3 C)] 99 F (37.2 C) (10/28 1126) Pulse Rate:  [88-102] 88 (10/28 1126) Resp:  [16-20] 20 (10/28 1126) BP: (98-115)/(57-71) 115/71 (10/28 1126) SpO2:  [96 %-100 %] 100 % (10/28 1126)  Weight change: -0.032 kg Filed Weights   05/22/20 1537 05/27/20 1108 05/28/20 1047  Weight: 59 kg 59 kg 59 kg    Intake/Output: I/O last 3 completed shifts: In: 198.2 [IV Piggyback:198.2] Out: 053 [Urine:620; Other:2]   Intake/Output this shift:  No intake/output data recorded.  Physical Exam: General:  Is pleasant and comfortable  Head: Moist oral mucosal membranes  Eyes:  Sclerae and conjunctivae clear  Lungs:   Respirations even, unlabored, lungs clear  Heart:  S1S2, no rubs or gallops  Abdomen:  Soft, nontender, nondistended  Extremities: No lower extremity edema  Neurologic:  Oriented x3, speech clear and appropriate  Skin: No lesions or rashes, dressing intact on bilateral flank areas  Bil.Nephrostomy tubes  draining clear yellow, moderate amount of output    Basic Metabolic Panel: Recent Labs  Lab 05/25/20 0500 05/25/20 0500 05/26/20 0450 05/26/20 0450 05/27/20 0547 05/27/20 0547 05/28/20 0451 05/29/20 0612 05/29/20 0613  NA 139   < > 144  --  138  --  139 141 140  K 3.6   < > 3.3*  --  3.6  --  3.3* 3.5 3.4*  CL 109   < > 108  --  103  --  102 105 103  CO2 18*   < > 25  --  25  --  28 28 28   GLUCOSE 93   < > 100*  --  116*  --  105* 105* 102*  BUN  68*   < > 46*  --  26*  --  18 16 16   CREATININE 7.27*   < > 4.83*  --  2.99*  --  2.19* 2.08* 2.15*  CALCIUM 7.8*   < > 8.5*   < > 8.4*   < > 8.3* 8.2* 8.1*  MG  --   --  1.9  --   --   --   --   --   --   PHOS 5.5*  --  4.1  --  2.9  --  2.9  --  2.2*   < > = values in this interval not displayed.    Liver Function Tests: Recent Labs  Lab 05/22/20 1542 05/24/20 0343 05/26/20 0450 05/27/20 0547 05/28/20 0451 05/29/20 0612 05/29/20 0613  AST 14*  --   --   --   --  19  --   ALT 9  --   --   --   --  7  --   ALKPHOS 62  --   --   --   --  43  --   BILITOT 0.5  --   --   --   --  0.5  --   PROT 7.7  --   --   --   --  6.8  --  ALBUMIN 3.0*   < > 2.5* 2.7* 2.5* 2.6* 2.6*   < > = values in this interval not displayed.   Recent Labs  Lab 05/22/20 1934  LIPASE 27   No results for input(s): AMMONIA in the last 168 hours.  CBC: Recent Labs  Lab 05/25/20 0422 05/26/20 0450 05/27/20 0547 05/28/20 0451 05/29/20 0612  WBC 9.7 9.3 9.8 8.1 8.9  NEUTROABS  --   --   --   --  6.6  HGB 9.1* 9.2* 9.3* 8.7* 8.5*  HCT 27.5* 27.9* 28.8* 26.7* 26.9*  MCV 79.9* 79.0* 81.6 82.9 83.3  PLT 354 393 423* 341 320    Cardiac Enzymes: No results for input(s): CKTOTAL, CKMB, CKMBINDEX, TROPONINI in the last 168 hours.  BNP: Invalid input(s): POCBNP  CBG: No results for input(s): GLUCAP in the last 168 hours.  Microbiology: Results for orders placed or performed during the hospital encounter of 05/22/20  Respiratory Panel by RT PCR (Flu A&B, Covid) - Nasopharyngeal Swab     Status: None   Collection Time: 05/22/20  7:34 PM   Specimen: Nasopharyngeal Swab  Result Value Ref Range Status   SARS Coronavirus 2 by RT PCR NEGATIVE NEGATIVE Final    Comment: (NOTE) SARS-CoV-2 target nucleic acids are NOT DETECTED.  The SARS-CoV-2 RNA is generally detectable in upper respiratoy specimens during the acute phase of infection. The lowest concentration of SARS-CoV-2 viral copies this assay  can detect is 131 copies/mL. A negative result does not preclude SARS-Cov-2 infection and should not be used as the sole basis for treatment or other patient management decisions. A negative result may occur with  improper specimen collection/handling, submission of specimen other than nasopharyngeal swab, presence of viral mutation(s) within the areas targeted by this assay, and inadequate number of viral copies (<131 copies/mL). A negative result must be combined with clinical observations, patient history, and epidemiological information. The expected result is Negative.  Fact Sheet for Patients:  PinkCheek.be  Fact Sheet for Healthcare Providers:  GravelBags.it  This test is no t yet approved or cleared by the [place] FDA and  has been authorized for detection and/or diagnosis of SARS-CoV-2 by FDA under an Emergency Use Authorization (EUA). This EUA will remain  in effect (meaning this test can be used) for the duration of the COVID-19 declaration under Section 564(b)(1) of the Act, 21 U.S.C. section 360bbb-3(b)(1), unless the authorization is terminated or revoked sooner.     Influenza A by PCR NEGATIVE NEGATIVE Final   Influenza B by PCR NEGATIVE NEGATIVE Final    Comment: (NOTE) The Xpert Xpress SARS-CoV-2/FLU/RSV assay is intended as an aid in  the diagnosis of influenza from Nasopharyngeal swab specimens and  should not be used as a sole basis for treatment. Nasal washings and  aspirates are unacceptable for Xpert Xpress SARS-CoV-2/FLU/RSV  testing.  Fact Sheet for Patients: PinkCheek.be  Fact Sheet for Healthcare Providers: GravelBags.it  This test is not yet approved or cleared by the [place] FDA and  has been authorized for detection and/or diagnosis of SARS-CoV-2 by  FDA under an Emergency Use Authorization (EUA). This EUA will remain   in effect (meaning this test can be used) for the duration of the  Covid-19 declaration under Section 564(b)(1) of the Act, 21  U.S.C. section 360bbb-3(b)(1), unless the authorization is  terminated or revoked. Performed at [facility], [address] [place], [place] [address]   Gastrointestinal Panel by PCR , Stool  Status: None   Collection Time: 05/24/20  1:00 AM   Specimen: Stool  Result Value Ref Range Status   Campylobacter species NOT DETECTED NOT DETECTED Final   Plesimonas shigelloides NOT DETECTED NOT DETECTED Final   Salmonella species NOT DETECTED NOT DETECTED Final   Yersinia enterocolitica NOT DETECTED NOT DETECTED Final   Vibrio species NOT DETECTED NOT DETECTED Final   Vibrio cholerae NOT DETECTED NOT DETECTED Final   Enteroaggregative E coli (EAEC) NOT DETECTED NOT DETECTED Final   Enteropathogenic E coli (EPEC) NOT DETECTED NOT DETECTED Final   Enterotoxigenic E coli (ETEC) NOT DETECTED NOT DETECTED Final   Shiga like toxin producing E coli (STEC) NOT DETECTED NOT DETECTED Final   Shigella/Enteroinvasive E coli (EIEC) NOT DETECTED NOT DETECTED Final   Cryptosporidium NOT DETECTED NOT DETECTED Final   Cyclospora cayetanensis NOT DETECTED NOT DETECTED Final   Entamoeba histolytica NOT DETECTED NOT DETECTED Final   Giardia lamblia NOT DETECTED NOT DETECTED Final   Adenovirus F40/41 NOT DETECTED NOT DETECTED Final   Astrovirus NOT DETECTED NOT DETECTED Final   Norovirus GI/GII NOT DETECTED NOT DETECTED Final   Rotavirus A NOT DETECTED NOT DETECTED Final   Sapovirus (I, II, IV, and V) NOT DETECTED NOT DETECTED Final    Comment: Performed at [facility], [address] [place], [place] [address]  C Difficile Quick Screen w PCR reflex     Status: None   Collection Time: 05/24/20  1:00 AM   Specimen: Stool  Result Value Ref Range Status   C Diff antigen NEGATIVE NEGATIVE Final   C Diff toxin NEGATIVE NEGATIVE Final   C Diff interpretation  No C. difficile detected.  Final    Comment: Performed at [facility], [address] [place], [place] [address]  Urine Culture     Status: None   Collection Time: 05/25/20  4:31 AM   Specimen: Urine, Catheterized  Result Value Ref Range Status   Specimen Description   Final    URINE, CATHETERIZED Performed at [facility], [address] [place], [place] [address]    Special Requests   Final    NONE Performed at [facility], [address] [place], [place] [address]    Culture   Final    NO GROWTH Performed at [facility], [place] [address], [place], [place] [address]    Report Status 05/26/2020 FINAL  Final  Surgical pcr screen     Status: None   Collection Time: 05/29/20  5:54 AM   Specimen: Nasal Mucosa; Nasal Swab  Result Value Ref Range Status   MRSA, PCR NEGATIVE NEGATIVE Final   Staphylococcus aureus NEGATIVE NEGATIVE Final    Comment: (NOTE) The Xpert SA Assay (FDA approved for NASAL specimens in patients 46 years of age and older), is one component of a comprehensive surveillance program. It is not intended to diagnose infection nor to guide or monitor treatment. Performed at [facility], [address] [place],  [address]     Coagulation Studies: No results for input(s): LABPROT, INR in the last 72 hours.  Urinalysis: No results for input(s): COLORURINE, LABSPEC, PHURINE, GLUCOSEU, HGBUR, BILIRUBINUR, KETONESUR, PROTEINUR, UROBILINOGEN, NITRITE, LEUKOCYTESUR in the last 72 hours.  Invalid input(s): APPERANCEUR    Imaging: CT CHEST WO CONTRAST  Result Date: 05/27/2020 CLINICAL DATA:  New diagnosis of rectal cancer. EXAM: CT CHEST WITHOUT CONTRAST TECHNIQUE: Multidetector CT imaging of the chest was performed following the standard protocol without IV  contrast. COMPARISON:  Abdominal CT 05/22/2020 FINDINGS: Cardiovascular: Normal aortic caliber. Normal heart size, without pericardial effusion.  Mediastinum/Nodes: No supraclavicular adenopathy. No mediastinal or definite hilar adenopathy, given limitations of unenhanced CT. Lungs/Pleura: No pleural fluid. Left base scarring or subsegmental atelectasis. Nonspecific 2 mm right upper lobe pulmonary nodule on 29/3. Calcified granuloma of 2 mm in the right lower lobe on 85/3. Upper Abdomen: Subtle hypoattenuating hepatic dome lesion again identified. The dominant right hepatic lobe mass is excluded. Normal imaged portions of the spleen, stomach, pancreas, adrenal glands, right kidney. Musculoskeletal: Presumed bone island within the sternal body. IMPRESSION: 1.  No acute process or evidence of metastatic disease in the chest. 2. Nonspecific tiny right upper lobe pulmonary nodule. No thoracic adenopathy. 3. Liver masses, as before. Electronically Signed   By: Abigail Miyamoto M.D.   On: 05/27/2020 19:34     Medications:   . sodium chloride    .  ceFAZolin (ANCEF) IV     . calcium acetate  667 mg Oral TID WC  . pantoprazole (PROTONIX) IV  40 mg Intravenous Q12H  . potassium chloride  20 mEq Oral Once  . sodium bicarbonate  650 mg Oral TID   ondansetron **OR** ondansetron (ZOFRAN) IV  Assessment/ Plan:  Ms. Ocean Kearley is a 54 y.o.  female presented to the emergency room with the chief complaint of rectal bleeding for last 3 months.CT scan of the abdomen and pelvis that demonstrated marked severe sigmoid colitis numerous, numerous areas of soft tissue nodularity within the mesentery suggestive of an underlying neoplastic process that cannot be excluded heterogeneous liver masses noted suspicious for a neoplasm. Moderately severe Faera aortic and aortocaval lymphadenopathy noted marked severe bilateral hydronephrosis and hydroureter representing partial renal obstruction.  Patient was consulted for nephrology for acute kidney injury.  She has bilateral nephrostomy tubes, placed during this admission.  # Acute kidney injury secondary to  obstruction Patient's renal function is improving after the placement of nephrostomy tubes. Lab Results  Component Value Date   CREATININE 2.15 (H) 05/29/2020   CREATININE 2.08 (H) 05/29/2020   CREATININE 2.19 (H) 05/28/2020    #Anemia secondary to chronic disease Patient underwent multiple GI procedures during this admission due to persistent rectal bleeding. Colonoscopy and liver biopsy revealed Adenocarcinoma Hemoglobin 8.5 today Will continue monitoring CBCs closely     LOS: 7 Jace Dowe 10/28/20211:00 PM

## 2020-05-29 NOTE — OR Nursing (Signed)
Patient removed two silver colored piercings from left ear and left nostril prior to intubation. These items were placed in a bag with a patient label and transferred to PACU with the patient along with her eyeglasses and cell phone which were also separately bagged and labeled.

## 2020-05-29 NOTE — Anesthesia Preprocedure Evaluation (Addendum)
Anesthesia Evaluation  Patient identified by MRN, date of birth, ID band Patient awake    Reviewed: Allergy & Precautions, NPO status , Patient's Chart, lab work & pertinent test results  History of Anesthesia Complications Negative for: history of anesthetic complications  Airway Mallampati: II       Dental   Pulmonary neg sleep apnea, neg COPD, Not current smoker,           Cardiovascular (-) hypertension(-) Past MI and (-) CHF (-) pacemaker(-) Valvular Problems/Murmurs     Neuro/Psych neg Seizures    GI/Hepatic Neg liver ROS, neg GERD  ,  Endo/Other  neg diabetes  Renal/GU Renal disease (hydronephrosis)     Musculoskeletal   Abdominal   Peds  Hematology   Anesthesia Other Findings   Reproductive/Obstetrics                             Anesthesia Physical Anesthesia Plan  ASA: III  Anesthesia Plan: General   Post-op Pain Management:    Induction: Intravenous  PONV Risk Score and Plan: 3 and Propofol infusion and TIVA  Airway Management Planned: Nasal Cannula  Additional Equipment:   Intra-op Plan:   Post-operative Plan:   Informed Consent: I have reviewed the patients History and Physical, chart, labs and discussed the procedure including the risks, benefits and alternatives for the proposed anesthesia with the patient or authorized representative who has indicated his/her understanding and acceptance.       Plan Discussed with:   Anesthesia Plan Comments:         Anesthesia Quick Evaluation

## 2020-05-29 NOTE — Progress Notes (Signed)
2 earrings, glasses and cell phone with charger returned to patient

## 2020-05-29 NOTE — Progress Notes (Signed)
Patient returned from PACU

## 2020-05-30 ENCOUNTER — Encounter: Payer: Self-pay | Admitting: Surgery

## 2020-05-30 ENCOUNTER — Inpatient Hospital Stay: Payer: Medicaid Other

## 2020-05-30 LAB — RENAL FUNCTION PANEL
Albumin: 2.6 g/dL — ABNORMAL LOW (ref 3.5–5.0)
Anion gap: 11 (ref 5–15)
BUN: 18 mg/dL (ref 6–20)
CO2: 26 mmol/L (ref 22–32)
Calcium: 8.4 mg/dL — ABNORMAL LOW (ref 8.9–10.3)
Chloride: 102 mmol/L (ref 98–111)
Creatinine, Ser: 1.87 mg/dL — ABNORMAL HIGH (ref 0.44–1.00)
GFR, Estimated: 32 mL/min — ABNORMAL LOW (ref >60)
Glucose, Bld: 145 mg/dL — ABNORMAL HIGH (ref 70–99)
Phosphorus: 3.1 mg/dL (ref 2.5–4.6)
Potassium: 3.9 mmol/L (ref 3.5–5.1)
Sodium: 139 mmol/L (ref 135–145)

## 2020-05-30 LAB — CBC
HCT: 26.8 % — ABNORMAL LOW (ref 36.0–46.0)
Hemoglobin: 8.7 g/dL — ABNORMAL LOW (ref 12.0–15.0)
MCH: 26.9 pg (ref 26.0–34.0)
MCHC: 32.5 g/dL (ref 30.0–36.0)
MCV: 82.7 fL (ref 80.0–100.0)
Platelets: 348 10*3/uL (ref 150–400)
RBC: 3.24 MIL/uL — ABNORMAL LOW (ref 3.87–5.11)
RDW: 17.3 % — ABNORMAL HIGH (ref 11.5–15.5)
WBC: 11.2 10*3/uL — ABNORMAL HIGH (ref 4.0–10.5)
nRBC: 0 % (ref 0.0–0.2)

## 2020-05-30 MED ORDER — HYDROCODONE-ACETAMINOPHEN 5-325 MG PO TABS: 1.0000 | ORAL_TABLET | ORAL | Status: DC | PRN

## 2020-05-30 NOTE — Progress Notes (Signed)
[facility]  ROUNDING NOTE   Subjective:   Patient resting in bed, in no acute distress.  She is pleasant and cooperative, reports she is n.p.o. again today for the procedure, possibly port placement.  Patient had colonoscopy and liver biopsy recently, which reveal invasive adenocarcinoma, colorectal primary.  Patient sitting up in bed, consuming breakfast.  She appears very pleasant.  She went for left internal jugular port placement yesterday. Her bilateral nephrostomy tubes draining clear yellow output, denies pain or discomfort. Total urine output for the preceding 24 hours is 975 mL.  Her renal function is improving gradually.  Objective:  Vital signs in last 24 hours:  Temp:  [96.8 F (36 C)-99 F (37.2 C)] 97.7 F (36.5 C) (10/29 0809) Pulse Rate:  [63-97] 85 (10/29 0809) Resp:  [10-25] 18 (10/29 0809) BP: (92-170)/(60-80) 105/60 (10/29 0809) SpO2:  [97 %-100 %] 97 % (10/29 0809) FiO2 (%):  [100 %] 100 % (10/28 2044)  Weight change:  Filed Weights   05/22/20 1537 05/27/20 1108 05/28/20 1047  Weight: 59 kg 59 kg 59 kg    Intake/Output: I/O last 3 completed shifts: In: 500 [I.V.:400; IV Piggyback:100] Out: 1100 [Urine:1100]   Intake/Output this shift:  Total I/O In: 120 [P.O.:120] Out: 358 [Urine:350; Chest Tube:8]  Physical Exam: General:  Sitting up in bed, in no acute distress  Head:  Normocephalic, atraumatic  Eyes:  Anicteric  Lungs:   Normal and symmetric respiratory effort lungs clear  Heart:  Regular rate and rhythm  Abdomen:  Soft, nontender, nondistended  Extremities:  1+ peripheral edema  Neurologic:  Awake, alert, oriented  Skin: No lesions or rashes, dressing intact on bilateral flank areas  Bil.Nephrostomy tubes  bilateral nephrostomies  draining clear yellow output    Basic Metabolic Panel: Recent Labs  Lab 05/26/20 0450 05/26/20 0450 05/27/20 0547 05/27/20 0547 05/28/20 0451 05/28/20 0451 05/29/20 0612 05/29/20 0613  05/30/20 0540  NA 144   < > 138  --  139  --  141 140 139  K 3.3*   < > 3.6  --  3.3*  --  3.5 3.4* 3.9  CL 108   < > 103  --  102  --  105 103 102  CO2 25   < > 25  --  28  --  28 28 26   GLUCOSE 100*   < > 116*  --  105*  --  105* 102* 145*  BUN 46*   < > 26*  --  18  --  16 16 18   CREATININE 4.83*   < > 2.99*  --  2.19*  --  2.08* 2.15* 1.87*  CALCIUM 8.5*   < > 8.4*   < > 8.3*   < > 8.2* 8.1* 8.4*  MG 1.9  --   --   --   --   --   --   --   --   PHOS 4.1  --  2.9  --  2.9  --   --  2.2* 3.1   < > = values in this interval not displayed.    Liver Function Tests: Recent Labs  Lab 05/27/20 0547 05/28/20 0451 05/29/20 0612 05/29/20 0613 05/30/20 0540  AST  --   --  19  --   --   ALT  --   --  7  --   --   ALKPHOS  --   --  43  --   --   BILITOT  --   --  0.5  --   --   PROT  --   --  6.8  --   --   ALBUMIN 2.7* 2.5* 2.6* 2.6* 2.6*   No results for input(s): LIPASE, AMYLASE in the last 168 hours. No results for input(s): AMMONIA in the last 168 hours.  CBC: Recent Labs  Lab 05/26/20 0450 05/27/20 0547 05/28/20 0451 05/29/20 0612 05/30/20 0539  WBC 9.3 9.8 8.1 8.9 11.2*  NEUTROABS  --   --   --  6.6  --   HGB 9.2* 9.3* 8.7* 8.5* 8.7*  HCT 27.9* 28.8* 26.7* 26.9* 26.8*  MCV 79.0* 81.6 82.9 83.3 82.7  PLT 393 423* 341 320 348    Cardiac Enzymes: No results for input(s): CKTOTAL, CKMB, CKMBINDEX, TROPONINI in the last 168 hours.  BNP: Invalid input(s): POCBNP  CBG: No results for input(s): GLUCAP in the last 168 hours.  Microbiology: Results for orders placed or performed during the hospital encounter of 05/22/20  Respiratory Panel by RT PCR (Flu A&B, Covid) - Nasopharyngeal Swab     Status: None   Collection Time: 05/22/20  7:34 PM   Specimen: Nasopharyngeal Swab  Result Value Ref Range Status   SARS Coronavirus 2 by RT PCR NEGATIVE NEGATIVE Final    Comment: (NOTE) SARS-CoV-2 target nucleic acids are NOT DETECTED.  The SARS-CoV-2 RNA is generally  detectable in upper respiratoy specimens during the acute phase of infection. The lowest concentration of SARS-CoV-2 viral copies this assay can detect is 131 copies/mL. A negative result does not preclude SARS-Cov-2 infection and should not be used as the sole basis for treatment or other patient management decisions. A negative result may occur with  improper specimen collection/handling, submission of specimen other than nasopharyngeal swab, presence of viral mutation(s) within the areas targeted by this assay, and inadequate number of viral copies (<131 copies/mL). A negative result must be combined with clinical observations, patient history, and epidemiological information. The expected result is Negative.  Fact Sheet for Patients:  PinkCheek.be  Fact Sheet for Healthcare Providers:  GravelBags.it  This test is no t yet approved or cleared by the [place] FDA and  has been authorized for detection and/or diagnosis of SARS-CoV-2 by FDA under an Emergency Use Authorization (EUA). This EUA will remain  in effect (meaning this test can be used) for the duration of the COVID-19 declaration under Section 564(b)(1) of the Act, 21 U.S.C. section 360bbb-3(b)(1), unless the authorization is terminated or revoked sooner.     Influenza A by PCR NEGATIVE NEGATIVE Final   Influenza B by PCR NEGATIVE NEGATIVE Final    Comment: (NOTE) The Xpert Xpress SARS-CoV-2/FLU/RSV assay is intended as an aid in  the diagnosis of influenza from Nasopharyngeal swab specimens and  should not be used as a sole basis for treatment. Nasal washings and  aspirates are unacceptable for Xpert Xpress SARS-CoV-2/FLU/RSV  testing.  Fact Sheet for Patients: PinkCheek.be  Fact Sheet for Healthcare Providers: GravelBags.it  This test is not yet approved or cleared by the [place] FDA and   has been authorized for detection and/or diagnosis of SARS-CoV-2 by  FDA under an Emergency Use Authorization (EUA). This EUA will remain  in effect (meaning this test can be used) for the duration of the  Covid-19 declaration under Section 564(b)(1) of the Act, 21  U.S.C. section 360bbb-3(b)(1), unless the authorization is  terminated or revoked. Performed at [facility], [address] [place], [place] [address]   Gastrointestinal Panel by  PCR , Stool     Status: None   Collection Time: 05/24/20  1:00 AM   Specimen: Stool  Result Value Ref Range Status   Campylobacter species NOT DETECTED NOT DETECTED Final   Plesimonas shigelloides NOT DETECTED NOT DETECTED Final   Salmonella species NOT DETECTED NOT DETECTED Final   Yersinia enterocolitica NOT DETECTED NOT DETECTED Final   Vibrio species NOT DETECTED NOT DETECTED Final   Vibrio cholerae NOT DETECTED NOT DETECTED Final   Enteroaggregative E coli (EAEC) NOT DETECTED NOT DETECTED Final   Enteropathogenic E coli (EPEC) NOT DETECTED NOT DETECTED Final   Enterotoxigenic E coli (ETEC) NOT DETECTED NOT DETECTED Final   Shiga like toxin producing E coli (STEC) NOT DETECTED NOT DETECTED Final   Shigella/Enteroinvasive E coli (EIEC) NOT DETECTED NOT DETECTED Final   Cryptosporidium NOT DETECTED NOT DETECTED Final   Cyclospora cayetanensis NOT DETECTED NOT DETECTED Final   Entamoeba histolytica NOT DETECTED NOT DETECTED Final   Giardia lamblia NOT DETECTED NOT DETECTED Final   Adenovirus F40/41 NOT DETECTED NOT DETECTED Final   Astrovirus NOT DETECTED NOT DETECTED Final   Norovirus GI/GII NOT DETECTED NOT DETECTED Final   Rotavirus A NOT DETECTED NOT DETECTED Final   Sapovirus (I, II, IV, and V) NOT DETECTED NOT DETECTED Final    Comment: Performed at [facility], [address] [place], [place] [address]  C Difficile Quick Screen w PCR reflex     Status: None   Collection Time: 05/24/20  1:00 AM   Specimen:  Stool  Result Value Ref Range Status   C Diff antigen NEGATIVE NEGATIVE Final   C Diff toxin NEGATIVE NEGATIVE Final   C Diff interpretation No C. difficile detected.  Final    Comment: Performed at [facility], [address] [place], [place] [address]  Urine Culture     Status: None   Collection Time: 05/25/20  4:31 AM   Specimen: Urine, Catheterized  Result Value Ref Range Status   Specimen Description   Final    URINE, CATHETERIZED Performed at [facility], [address], [place], [place] [address]    Special Requests   Final    NONE Performed at [facility], [address], [place], [place] [address]    Culture   Final    NO GROWTH Performed at [facility], [place] [place] [address], [place], [place] [address]    Report Status 05/26/2020 FINAL  Final  Surgical pcr screen     Status: None   Collection Time: 05/29/20  5:54 AM   Specimen: Nasal Mucosa; Nasal Swab  Result Value Ref Range Status   MRSA, PCR NEGATIVE NEGATIVE Final   Staphylococcus aureus NEGATIVE NEGATIVE Final    Comment: (NOTE) The Xpert SA Assay (FDA approved for NASAL specimens in patients 59 years of age and older), is one component of a comprehensive surveillance program. It is not intended to diagnose infection nor to guide or monitor treatment. Performed at [facility], [address] [place],  [address]     Coagulation Studies: No results for input(s): LABPROT, INR in the last 72 hours.  Urinalysis: No results for input(s): COLORURINE, LABSPEC, PHURINE, GLUCOSEU, HGBUR, BILIRUBINUR, KETONESUR, PROTEINUR, UROBILINOGEN, NITRITE, LEUKOCYTESUR in the last 72 hours.  Invalid input(s): APPERANCEUR    Imaging: DG Chest Port 1 View  Result Date: 05/30/2020 CLINICAL DATA:  Follow-up pneumothorax EXAM: PORTABLE CHEST 1 VIEW COMPARISON:  Yesterday FINDINGS: Pigtail catheter over the left mid  chest with no visible pneumothorax remaining. Small volume  soft tissue gas at the left apex. Left-sided porta catheter with tip at the distal left brachiocephalic. Retrocardiac atelectasis. Artifact from EKG leads. IMPRESSION: No visible residual pneumothorax. Electronically Signed   By: Monte Fantasia M.D.   On: 05/30/2020 04:31   DG Chest Port 1 View  Result Date: 05/29/2020 CLINICAL DATA:  Follow-up pneumothorax EXAM: PORTABLE CHEST 1 VIEW COMPARISON:  Earlier same day FINDINGS: Left Port-A-Cath remains in place. Left pneumothorax is larger, increased from about 10% to about 25%. No evidence of tension or mediastinal shift. Mild atelectasis the left lung base. IMPRESSION: Enlarging left pneumothorax, 10% to about 25%. No evidence of tension or mediastinal shift. These results will be called to the ordering clinician or representative by the Radiologist Assistant, and communication documented in the PACS or [employer]. Electronically Signed   By: Nelson Chimes M.D.   On: 05/29/2020 19:20   DG Chest Port 1 View  Result Date: 05/29/2020 CLINICAL DATA:  54 year old female status post Port-A-Cath placement. EXAM: PORTABLE CHEST 1 VIEW COMPARISON:  Chest CT dated 05/27/2020. FINDINGS: Left-sided Port-A-Cath with tip over central SVC. There is a small left apical pneumothorax measuring approximately 2.4 cm in thickness. No focal consolidation, or pleural effusion. The cardiac silhouette is within limits. No acute osseous pathology. Small pockets of soft tissue air noted over the left clavicle. IMPRESSION: Status post Port-A-Cath placement.  Small left apical pneumothorax. These results were called by telephone at the time of interpretation on 05/29/2020 at 4:26 pm to nurse Maree Krabbe, who verbally acknowledged these results. Electronically Signed   By: Anner Crete M.D.   On: 05/29/2020 16:33   DG C-Arm 1-60 Min-No Report  Result Date: 05/29/2020 Fluoroscopy was utilized by the requesting physician.  No radiographic interpretation.     Medications:    . sodium chloride     . calcium acetate  667 mg Oral TID WC  . pantoprazole (PROTONIX) IV  40 mg Intravenous Q12H  . potassium chloride  20 mEq Oral Once  . sodium bicarbonate  650 mg Oral TID   acetaminophen, HYDROcodone-acetaminophen, ondansetron **OR** ondansetron (ZOFRAN) IV, oxyCODONE  Assessment/ Plan:  [name] is a 54 y.o.  female presented to the emergency room with the chief complaint of rectal bleeding for last 3 months.CT scan of the abdomen and pelvis that demonstrated marked severe sigmoid colitis numerous, numerous areas of soft tissue nodularity within the mesentery suggestive of an underlying neoplastic process that cannot be excluded heterogeneous liver masses noted suspicious for a neoplasm. Moderately severe Faera aortic and aortocaval lymphadenopathy noted marked severe bilateral hydronephrosis and hydroureter representing partial renal obstruction.  Patient was consulted for nephrology for acute kidney injury.  She has bilateral nephrostomy tubes, placed during this admission.  # Acute kidney injury secondary to obstruction  Lab Results  Component Value Date   CREATININE 1.87 (H) 05/30/2020   CREATININE 2.15 (H) 05/29/2020   CREATININE 2.08 (H) 05/29/2020  Total urine output for the preceding 24 hours is 975 mL Renal function improving Nephrostomy tubes draining well, clear yellow output No acute indication for dialysis  #Anemia secondary to chronic disease Hemoglobin level low, but stable at 8.7 today No complaints of active rectal bleeding  #Left pneumo thorax Patient underwent Port-A-Cath placement yesterday which got complicated by left-sided pneumothorax. She underwent chest tube placement with IR last night Chest x-ray this morning  IMPRESSION: No visible residual pneumothorax Patient denies shortness of breath  LOS: 8 Anita Sullivan 10/29/20213:28 PM

## 2020-05-30 NOTE — Progress Notes (Signed)
[facility] Day(s): 8.   Post op day(s): 1 Day Post-Op.   Interval History:  Patient seen and examined Unfortunately her port placement was complicated by left PTX. She underwent chest tube placement with IR last night  This morning, patient reports she is overall doing well. She does have a constant 4/10 chest pain at site of chest tube, improved with pain regimen No fever, chills, nausea, emesis Slight leukocytosis this morning to 11.2K, this is most likely reactive from procedures yesterday Hgb remains stable at 8.7; no further reports of hematochezia this mornign CXR this morning without residual PTX She is without air leak; no output Diet advanced to soft diet yesterday; tolerating without issues  Vital signs in last 24 hours: [min-max] current  Temp:  [96.8 F (36 C)-99 F (37.2 C)] 97.7 F (36.5 C) (10/29 0405) Pulse Rate:  [63-97] 83 (10/29 0405) Resp:  [10-25] 16 (10/29 0405) BP: (92-170)/(60-80) 99/67 (10/29 0405) SpO2:  [97 %-100 %] 99 % (10/29 0405) FiO2 (%):  [100 %] 100 % (10/28 2044)     Height: 5' (152.4 cm) Weight: 59 kg BMI (Calculated): 25.39   Intake/Output last 2 shifts:  10/28 0701 - 10/29 0700 In: 500 [I.V.:400; IV Piggyback:100] Out: 975 [Urine:975]   Physical Exam:  Constitutional: alert, cooperative and no distress  Respiratory: breathing non-labored at rest  Cardiovascular: regular rate and sinus rhythm  Chest: Chest tube in the left lateral chest wall, site is CDI, no drainage, no air leak Gastrointestinal: Soft, non-tender, and non-distended Integumentary: Port in the left upper chest, site is CDI with dermabond, no drainage, this is quite ecchymotic  Labs:  CBC Latest Ref Rng & Units 05/30/2020 05/29/2020 05/28/2020  WBC 4.0 - 10.5 K/uL 11.2(H) 8.9 8.1  Hemoglobin 12.0 - 15.0 g/dL 8.7(L) 8.5(L) 8.7(L)  Hematocrit 36 - 46 % 26.8(L) 26.9(L) 26.7(L)  Platelets 150 - 400 K/uL 348 320 341   CMP  Latest Ref Rng & Units 05/29/2020 05/29/2020 05/28/2020  Glucose 70 - 99 mg/dL 102(H) 105(H) 105(H)  BUN 6 - 20 mg/dL 16 16 18   Creatinine 0.44 - 1.00 mg/dL 2.15(H) 2.08(H) 2.19(H)  Sodium 135 - 145 mmol/L 140 141 139  Potassium 3.5 - 5.1 mmol/L 3.4(L) 3.5 3.3(L)  Chloride 98 - 111 mmol/L 103 105 102  CO2 22 - 32 mmol/L 28 28 28   Calcium 8.9 - 10.3 mg/dL 8.1(L) 8.2(L) 8.3(L)  Total Protein 6.5 - 8.1 g/dL - 6.8 -  Total Bilirubin 0.3 - 1.2 mg/dL - 0.5 -  Alkaline Phos 38 - 126 U/L - 43 -  AST 15 - 41 U/L - 19 -  ALT 0 - 44 U/L - 7 -     Imaging studies:   CXR (05/30/2020) personal;ly reviewed showing resolution in left sided PTX with chest tube in place, and radiologist report reviewed below:  IMPRESSION: No visible residual pneumothorax.   Assessment/Plan: 54 y.o. female 1 Day Post-Op s/p attempted left subclavian and ultimately left IJ port-a-catheter placement for metastatic colorectal adenocarcinoma, complicated by, now resolved, left pneumothorax s/p chest tube placement on 10/28   - Okay to access port should need arise  - Keep chest tube to suction today; place to water seal in AM if CXR looks good and without air leak. CXR can be repeat 6-8 hours after placed to water seal and if without PTX, chest tube can be removed.   - Monitor rectal bleeding &H&H; transfuse as needed -Monitor abdominal examination; on-going  bowel function  - Nephrology following; no need for renal replacement at this time; continue PCNs; monitor and record output - Appreciate oncology assistance - Mobilization encouraged - Further manage per primary service; we will follow   All of the above findings and recommendations were discussed with the patient, and the medical team, and all of patient's questions were answered to her expressed satisfaction.  -- Edison Simon, PA-C [facility] 05/30/2020, 7:26  AM [phone] M-F: 7am - 4pm

## 2020-05-30 NOTE — Progress Notes (Signed)
PROGRESS NOTE  Anita Sullivan MHD:[id number] DOB: Apr 15, 1966 DOA: 05/22/2020 PCP: Volney American, PA-C  Brief History   Anita Senegal Uribeis a 54 y.o.femaleadmitted last night with rectal bleeding. Ongoing for 3 months on and off.The emergency room her hemoglobin was noted to be 6.4 g with an MCV of 75.8. Creatinine of 9.82 and CO2 of 16. Large amount of blood and leukocytes in the urine.  CT scan of the abdomen and pelvis that demonstrated marked severe sigmoid colitis numerous, numerous areas of soft tissue nodularity within the mesentery suggestive of an underlying neoplastic process that cannot be excluded heterogeneous liver masses noted suspicious for a neoplasm. Moderately severe Faera aortic and aortocaval lymphadenopathy noted marked severe bilateral hydronephrosis and hydroureter representing partial renal obstruction. High concern for extensive malignancy. Patient has quite a bit of unintentional weight loss recently. No family history of malignancy.  Multiple specialities were consulted this morning which include GI, urology, nephrology and oncology. She is diagnosed with stage IV colorectal cancer by oncology. Chemotherapy is to start next week. She underwent emergent bilateral nephrostomy tube placement by IR and draining well now.  The patient underwent flexible sigmoidoscopy today which provided visual confirmation of the mass. Hemostasis was obtained and biopsies taken. The patient was returned to her room on a soft diet.   After the operative placement of the patient's port, the patient developed a pneumothorax. Chest tube was placed. Surgery following.  Consultants   Gastroenterology  General surgery  Oncology  Nephrology  Procedures   Flexible Sigmoidoscopy  IR for placement of bilateral nephrostomy tubes.  Liver biopsy with ultrasound  Port placement  Chest tube placement  Antibiotics   Anti-infectives (From admission, onward)   Start      Dose/Rate Route Frequency Ordered Stop   05/29/20 0600  ceFAZolin (ANCEF) IVPB 2g/100 mL premix        2 g 200 mL/hr over 30 Minutes Intravenous On call to O.R. 05/28/20 1248 05/29/20 1452   05/28/20 1100  ceFAZolin (ANCEF) IVPB 2g/100 mL premix  Status:  Discontinued        2 g 200 mL/hr over 30 Minutes Intravenous On call to O.R. 05/28/20 1010 05/28/20 1247   05/23/20 1100  ceFAZolin (ANCEF) IVPB 2g/100 mL premix        2 g 200 mL/hr over 30 Minutes Intravenous To Radiology 05/23/20 1046 05/24/20 1100   05/23/20 1049  ceFAZolin (ANCEF) 2-4 GM/100ML-% IVPB       Note to Pharmacy: Genelle Bal   : cabinet override      05/23/20 1049 05/23/20 2259      Subjective  The patient is resting in bed. She is having some pain related to chest tube.  Objective   Vitals:  Vitals:   05/30/20 0405 05/30/20 0809  BP: 99/67 105/60  Pulse: 83 85  Resp: 16 18  Temp: 97.7 F (36.5 C) 97.7 F (36.5 C)  SpO2: 99% 97%    Exam:  Constitutional:   The patient is awake, alert, and oriented x 3. No acute distress. Respiratory:   No increased work of breathing.  No wheezes, rales, or rhonchi  No tactile fremitus Cardiovascular:   Regular rate and rhythm  No murmurs, ectopy, or gallups.  No lateral PMI. No thrills. Abdomen:   Abdomen is soft, non-tender, non-distended  No hernias, masses, or organomegaly  Normoactive bowel sounds.   Nephrostomy tubes draining yellow fluid. Musculoskeletal:   No cyanosis, clubbing, or edema Skin:   No rashes, lesions,  ulcers  palpation of skin: no induration or nodules Neurologic:   CN 2-12 intact  Sensation all 4 extremities intact Psychiatric:   Mental status o Mood, affect appropriate o Orientation to person, place, time   judgment and insight appear intact  I have personally reviewed the following:   Today's Data   Vitals, BMP, CBC  Micro Data   Urine culture: No growth  Imaging   CT abdomen and pelvis  CT  chest  Scheduled Meds:  calcium acetate  667 mg Oral TID WC   pantoprazole (PROTONIX) IV  40 mg Intravenous Q12H   potassium chloride  20 mEq Oral Once   sodium bicarbonate  650 mg Oral TID   Continuous Infusions:  sodium chloride      Principal Problem:   Metastatic colorectal cancer (HCC) Active Problems:   GI bleed   Acute renal failure (HCC)   Obstructive uropathy   Colitis, acute   Bleeding per rectum   Rectal bleed   Palliative care encounter   LOS: 8 days   A & P  Lower GI bleed: Due to Stage IV Colorectal Cancer. S/P Flexible sigmoidoscopy today with hemostasis achieved after ongoing bleeding overnight. Rectal mass 2 cm vrom anal verge.  Patient had unintentional weight loss and intermittent rectal bleeding going on for about a year now. She has received total of 3 units of PRBC.  Also received IV iron.  Hemoglobin is down from 9.3 to 8.7 today. Monitor and transfuse for hemoglobin less than 8.0. Oncology is following-appreciate their recommendations.  Anemia panel with anemia of chronic disease, no significant iron deficiency. 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 She will go for port placement later today. Plan is for chemotherapy to start next week.  Acute kidney injury: Most likely secondary to obstructive uropathy as evident on CT abdomen. Urology was also consulted and according to their advice IR was consulted for bilateral emergent nephrostomy tube placement which was done yesterday. Patient draining well from nephrostomy tubes, both bags with some blood tinted urine one more than the other. Urology signed off -will need replacement of nephrostomy tubes in 4 to 6 weeks by IR. Nephrology recommendations are appreciate. She was started on calcium binders by nephrology yesterday. Creatinine started trending down, it was 2.19 with BUN of 18 today.  Bicarb at 28. Continue to encourage PO hydration. Continue oral sodium bicarbonate.  Monitor creatinine, electrolytes, and volume status. Avoid nephrotoxins and hypotension.  Pneumothorax: During port placement. Chest tube placed. Management as per surgery.  Anion gap metabolic acidosis: Resolved.  Anion gap resolved.  Lactic acid within normal limit. Most likely secondary to severe acute AKI and GI bleed.  Bicarb started improving and normalized.  I have seen and examined this patient myself. I have spent 35 minutes in her evaluation and care.  Merve Hotard, DO [facility] Direct contact: see [email or web address]  7PM-7AM contact night coverage as above 05/30/2020, 4:22 PM  LOS: 6 days

## 2020-05-30 NOTE — Progress Notes (Signed)
Mobility Specialist - Progress Note   05/30/20 1227  Mobility  Activity Refused mobility  Mobility performed by Mobility specialist    Pt politely declined mobility session at this time. Pt wants to rest, states "I just laid myself down". Will re-attempt at a later date/time.    Khalise Billard Mobility Specialist  05/30/20, 12:28 PM

## 2020-05-31 ENCOUNTER — Inpatient Hospital Stay: Payer: Medicaid Other

## 2020-05-31 LAB — CBC WITH DIFFERENTIAL/PLATELET
Abs Immature Granulocytes: 0.06 10*3/uL (ref 0.00–0.07)
Basophils Absolute: 0 10*3/uL (ref 0.0–0.1)
Basophils Relative: 0 %
Eosinophils Absolute: 0.1 10*3/uL (ref 0.0–0.5)
Eosinophils Relative: 1 %
HCT: 26.7 % — ABNORMAL LOW (ref 36.0–46.0)
Hemoglobin: 8.4 g/dL — ABNORMAL LOW (ref 12.0–15.0)
Immature Granulocytes: 1 %
Lymphocytes Relative: 19 %
Lymphs Abs: 2 10*3/uL (ref 0.7–4.0)
MCH: 26.5 pg (ref 26.0–34.0)
MCHC: 31.5 g/dL (ref 30.0–36.0)
MCV: 84.2 fL (ref 80.0–100.0)
Monocytes Absolute: 0.5 10*3/uL (ref 0.1–1.0)
Monocytes Relative: 5 %
Neutro Abs: 7.5 10*3/uL (ref 1.7–7.7)
Neutrophils Relative %: 74 %
Platelets: 360 10*3/uL (ref 150–400)
RBC: 3.17 MIL/uL — ABNORMAL LOW (ref 3.87–5.11)
RDW: 17.6 % — ABNORMAL HIGH (ref 11.5–15.5)
WBC: 10.1 10*3/uL (ref 4.0–10.5)
nRBC: 0 % (ref 0.0–0.2)

## 2020-05-31 LAB — BASIC METABOLIC PANEL
Anion gap: 8 (ref 5–15)
BUN: 23 mg/dL — ABNORMAL HIGH (ref 6–20)
CO2: 30 mmol/L (ref 22–32)
Calcium: 8 mg/dL — ABNORMAL LOW (ref 8.9–10.3)
Chloride: 103 mmol/L (ref 98–111)
Creatinine, Ser: 1.7 mg/dL — ABNORMAL HIGH (ref 0.44–1.00)
GFR, Estimated: 36 mL/min — ABNORMAL LOW (ref >60)
Glucose, Bld: 94 mg/dL (ref 70–99)
Potassium: 3.4 mmol/L — ABNORMAL LOW (ref 3.5–5.1)
Sodium: 141 mmol/L (ref 135–145)

## 2020-05-31 MED ORDER — PANTOPRAZOLE SODIUM 40 MG PO TBEC
40.0000 mg | DELAYED_RELEASE_TABLET | Freq: Two times a day (BID) | ORAL | Status: DC
  Administered 2020-05-31 – 2020-06-01 (×2): 40 mg via ORAL
  Filled 2020-05-31 (×2): qty 1

## 2020-05-31 NOTE — Progress Notes (Signed)
PROGRESS NOTE  Anita Sullivan OQH:[id number] DOB: [birth year] DOA: 05/22/2020 PCP: Volney American, PA-C  Brief History   Anita Nygard Uribeis a 54 y.o.femaleadmitted last night with rectal bleeding. Ongoing for 3 months on and off.The emergency room her hemoglobin was noted to be 6.4 g with an MCV of 75.8. Creatinine of 9.82 and CO2 of 16. Large amount of blood and leukocytes in the urine.  CT scan of the abdomen and pelvis that demonstrated marked severe sigmoid colitis numerous, numerous areas of soft tissue nodularity within the mesentery suggestive of an underlying neoplastic process that cannot be excluded heterogeneous liver masses noted suspicious for a neoplasm. Moderately severe Faera aortic and aortocaval lymphadenopathy noted marked severe bilateral hydronephrosis and hydroureter representing partial renal obstruction. High concern for extensive malignancy. Patient has quite a bit of unintentional weight loss recently. No family history of malignancy.  Multiple specialities were consulted this morning which include GI, urology, nephrology and oncology. She is diagnosed with stage IV colorectal cancer by oncology. Chemotherapy is to start next week. She underwent emergent bilateral nephrostomy tube placement by IR and draining well now.  The patient underwent flexible sigmoidoscopy today which provided visual confirmation of the mass. Hemostasis was obtained and biopsies taken. The patient was returned to her room on a soft diet.   After the operative placement of the patient's port, the patient developed a pneumothorax. Chest tube was placed. Surgery following.  Consultants  . Gastroenterology . General surgery . Oncology . Nephrology  Procedures  . Flexible Sigmoidoscopy . IR for placement of bilateral nephrostomy tubes. . Liver biopsy with ultrasound . Port placement . Chest tube placement  Antibiotics   Anti-infectives (From admission, onward)   Start      Dose/Rate Route Frequency Ordered Stop   05/29/20 0600  ceFAZolin (ANCEF) IVPB 2g/100 mL premix        2 g 200 mL/hr over 30 Minutes Intravenous On call to O.R. 05/28/20 1248 05/29/20 1452   05/28/20 1100  ceFAZolin (ANCEF) IVPB 2g/100 mL premix  Status:  Discontinued        2 g 200 mL/hr over 30 Minutes Intravenous On call to O.R. 05/28/20 1010 05/28/20 1247   05/23/20 1100  ceFAZolin (ANCEF) IVPB 2g/100 mL premix        2 g 200 mL/hr over 30 Minutes Intravenous To Radiology 05/23/20 1046 05/24/20 1100   05/23/20 1049  ceFAZolin (ANCEF) 2-4 GM/100ML-% IVPB       Note to Pharmacy: Anita Sullivan   : cabinet override      05/23/20 1049 05/23/20 2259      Subjective  The patient is resting in bed. She is complaining of  pain related to chest tube.  Objective   Vitals:  Vitals:   05/31/20 1314 05/31/20 1627  BP: 117/67 107/62  Pulse: (!) 101 96  Resp: 20 20  Temp: 98.5 F (36.9 C) 98.4 F (36.9 C)  SpO2: 96% 97%    Exam:  Constitutional:  . The patient is awake, alert, and oriented x 3. No acute distress. Respiratory:  . No increased work of breathing. . No wheezes, rales, or rhonchi . No tactile fremitus Cardiovascular:  . Regular rate and rhythm . No murmurs, ectopy, or gallups. . No lateral PMI. No thrills. Abdomen:  . Abdomen is soft, non-tender, non-distended . No hernias, masses, or organomegaly . Normoactive bowel sounds.  . Nephrostomy tubes draining yellow fluid. Musculoskeletal:  . No cyanosis, clubbing, or edema Skin:  . No  rashes, lesions, ulcers . palpation of skin: no induration or nodules Neurologic:  . CN 2-12 intact . Sensation all 4 extremities intact Psychiatric:  . Mental status o Mood, affect appropriate o Orientation to person, place, time  . judgment and insight appear intact  I have personally reviewed the following:   Today's Data  . Vitals, BMP, CBC  Micro Data  . Urine culture: No growth  Imaging  . CT abdomen and  pelvis . CT chest  Scheduled Meds: . calcium acetate  667 mg Oral TID WC  . pantoprazole (PROTONIX) IV  40 mg Intravenous Q12H  . potassium chloride  20 mEq Oral Once  . sodium bicarbonate  650 mg Oral TID   Continuous Infusions: . sodium chloride      Principal Problem:   Metastatic colorectal cancer (HCC) Active Problems:   GI bleed   Acute renal failure (HCC)   Obstructive uropathy   Colitis, acute   Bleeding per rectum   Rectal bleed   Palliative care encounter   LOS: 9 days   A & P  Lower GI bleed: Due to Stage IV Colorectal Cancer. S/P Flexible sigmoidoscopy today with hemostasis achieved after ongoing bleeding overnight. Rectal mass 2 cm vrom anal verge.  Patient had unintentional weight loss and intermittent rectal bleeding going on for about a year now. She has received total of 3 units of PRBC.  Also received IV iron.  Hemoglobin is down from 9.3 to 8.7 today. Monitor and transfuse for hemoglobin less than 8.0. Oncology is following-appreciate their recommendations.  Anemia panel with anemia of chronic disease, no significant iron deficiency. 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 She will go for port placement later today. Plan is for chemotherapy to start next week.  Acute kidney injury: Most likely secondary to obstructive uropathy as evident on CT abdomen. Urology was also consulted and according to their advice IR was consulted for bilateral emergent nephrostomy tube placement which was done yesterday. Patient draining well from nephrostomy tubes, both bags with some blood tinted urine one more than the other. Urology signed off -will need replacement of nephrostomy tubes in 4 to 6 weeks by IR. Nephrology recommendations are appreciate. She was started on calcium binders by nephrology yesterday. Creatinine started trending down, it was 2.19 with BUN of 18 today.  Bicarb at 28. Continue to encourage PO hydration. Continue oral sodium  bicarbonate. Monitor creatinine, electrolytes, and volume status. Avoid nephrotoxins and hypotension.  Pneumothorax: During port placement. Chest tube placed. Management as per surgery.  Anion gap metabolic acidosis: Resolved.  Anion gap resolved.  Lactic acid within normal limit. Most likely secondary to severe acute AKI and GI bleed.  Bicarb started improving and normalized.  I have seen and examined this patient myself. I have spent 32 minutes in her evaluation and care.  Sherita Decoste, DO [facility] Direct contact: see [email or web address]  7PM-7AM contact night coverage as above 05/31/2020, 4:47 PM  LOS: 6 days

## 2020-05-31 NOTE — Progress Notes (Signed)
[place]  Telephone:([phone]) [phone] [phone]) [phone]  ID: [name] OB: 1965-10-10  MR#: [id number]  AOZ#:[id number]  Patient Care Team: Volney American, PA-C as PCP - General (Family Medicine) [name], MD as Consulting Physician (Oncology) [name], RN as Oncology Nurse Navigator  CHIEF COMPLAINT: Metastatic colorectal adenocarcinoma.  INTERVAL HISTORY: Patient is a 54 year old female who was initially evaluated in the hospital after presenting with extreme weakness and fatigue and hemoglobin less than 7.0.  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  She continues to have weakness and fatigue, but has improved since discharge.  She has no neurologic complaints.  She denies any recent fevers.  She has a fair appetite, but denies weight loss.  She denies any chest pain, shortness of breath, cough, or hemoptysis.  She has some mild nausea, but denies any vomiting, constipation, or diarrhea.  She has no further hematochezia.  She has no urinary complaints.  Patient offers no further specific complaints today.  REVIEW OF SYSTEMS:   Review of Systems  Constitutional: Positive for malaise/fatigue. Negative for fever and weight loss.  Respiratory: Negative.  Negative for cough, hemoptysis and shortness of breath.   Cardiovascular: Negative.  Negative for chest pain and leg swelling.  Gastrointestinal: Positive for nausea. Negative for blood in stool, constipation and vomiting.  Genitourinary: Negative.  Negative for hematuria.  Musculoskeletal: Negative.  Negative for back pain.  Skin: Negative.  Negative for rash.  Neurological: Negative.  Negative for dizziness, seizures, weakness and headaches.  Psychiatric/Behavioral: Negative.  The patient is not nervous/anxious.     As per HPI. Otherwise, a complete review of systems is negative.  PAST MEDICAL HISTORY: History reviewed. No pertinent past  medical history.  PAST SURGICAL HISTORY: Past Surgical History:  Procedure Laterality Date  . COLONOSCOPY WITH PROPOFOL N/A 05/27/2020   Procedure: COLONOSCOPY WITH PROPOFOL;  Surgeon: [name], MD;  Location: [facility] ENDOSCOPY;  Service: Gastroenterology;  Laterality: N/A;  . FLEXIBLE SIGMOIDOSCOPY N/A 05/28/2020   Procedure: FLEXIBLE SIGMOIDOSCOPY;  Surgeon: [name], MD;  Location: [facility] ENDOSCOPY;  Service: Gastroenterology;  Laterality: N/A;  . IR NEPHROSTOMY PLACEMENT LEFT  05/23/2020  . IR NEPHROSTOMY PLACEMENT RIGHT  05/23/2020  . PORTACATH PLACEMENT Left 05/29/2020   Procedure: INSERTION PORT-A-CATH;  Surgeon: [name], MD;  Location: [facility] ORS;  Service: General;  Laterality: Left;    FAMILY HISTORY: Family History  Problem Relation Age of Onset  . Other Mother        Corticobasal degeneration  . Cancer Mother        Uterine  . Diabetes Maternal Grandmother   . Stroke Maternal Grandfather   . Stroke Paternal Grandfather   . Heart disease Father     ADVANCED DIRECTIVES (Y/N):  N  HEALTH MAINTENANCE: Social History   Tobacco Use  . Smoking status: Never Smoker  . Smokeless tobacco: Never Used  Vaping Use  . Vaping Use: Never used  Substance Use Topics  . Alcohol use: Never  . Drug use: Never     Colonoscopy:  PAP:  Bone density:  Lipid panel:  No Known Allergies  Current Outpatient Medications  Medication Sig Dispense Refill  . lidocaine-prilocaine (EMLA) cream Apply to affected area once 30 g 3  . ondansetron (ZOFRAN) 8 MG tablet Take 1 tablet (8 mg total) by mouth 2 (two) times daily as needed for refractory nausea / vomiting. 60 tablet 2  . oxyCODONE (OXY IR/ROXICODONE) 5  MG immediate release tablet Take 1 tablet (5 mg total) by mouth every 4 (four) hours as needed for moderate pain or severe pain. 14 tablet 0  . pantoprazole (PROTONIX) 40 MG tablet Take 1 tablet (40 mg total) by mouth 2 (two) times daily. 60 tablet 0  .  prochlorperazine (COMPAZINE) 10 MG tablet Take 1 tablet (10 mg total) by mouth every 6 (six) hours as needed (Nausea or vomiting). 60 tablet 2  . acetaminophen (TYLENOL) 500 MG tablet Take 1,000 mg by mouth every 6 (six) hours as needed. (Patient not taking: Reported on 05/22/2020)     No current facility-administered medications for this visit.    OBJECTIVE: Vitals:   06/03/20 1434  BP: 115/74  Pulse: 93  Resp: 20  Temp: 97.7 F (36.5 C)  SpO2: 100%     Body mass index is 26.25 kg/m.    ECOG FS:1 - Symptomatic but completely ambulatory  General: Well-developed, well-nourished, no acute distress. Eyes: Pink conjunctiva, anicteric sclera. HEENT: Normocephalic, moist mucous membranes. Lungs: No audible wheezing or coughing. Heart: Regular rate and rhythm. Abdomen: Soft, nontender, no obvious distention. Musculoskeletal: No edema, cyanosis, or clubbing. Neuro: Alert, answering all questions appropriately. Cranial nerves grossly intact. Skin: No rashes or petechiae noted. Psych: Normal affect. Lymphatics: No cervical, calvicular, axillary or inguinal LAD.   LAB RESULTS:  Lab Results  Component Value Date   NA 139 06/03/2020   K 3.6 06/03/2020   CL 100 06/03/2020   CO2 29 06/03/2020   GLUCOSE 119 (H) 06/03/2020   BUN 20 06/03/2020   CREATININE 1.48 (H) 06/03/2020   CALCIUM 8.7 (L) 06/03/2020   PROT 8.0 06/03/2020   ALBUMIN 3.3 (L) 06/03/2020   AST 23 06/03/2020   ALT 9 06/03/2020   ALKPHOS 59 06/03/2020   BILITOT 0.4 06/03/2020   GFRNONAA 42 (L) 06/03/2020    Lab Results  Component Value Date   WBC 9.2 06/03/2020   NEUTROABS 6.7 06/03/2020   HGB 9.9 (L) 06/03/2020   HCT 31.7 (L) 06/03/2020   MCV 84.5 06/03/2020   PLT 420 (H) 06/03/2020     STUDIES: CT ABDOMEN PELVIS WO CONTRAST  Result Date: 05/22/2020 CLINICAL DATA:  Intermittent rectal bleeding. EXAM: CT ABDOMEN AND PELVIS WITHOUT CONTRAST TECHNIQUE: Multidetector CT imaging of the abdomen and pelvis  was performed following the standard protocol without IV contrast. COMPARISON:  None. FINDINGS: Lower chest: No acute abnormality. Hepatobiliary: A 2.9 cm x 3.1 cm ill-defined area of heterogeneous low attenuation is seen within the lateral aspect of the liver dome (axial CT image 17, CT series number 2). An ill-defined, slightly exophytic 6.9 cm x 5.0 cm area of heterogeneous mildly decreased attenuation is seen within the posterior aspect of the right lobe of the liver. An ill-defined central area of low attenuation is noted within this region. No gallstones, gallbladder wall thickening, or biliary dilatation. Pancreas: Unremarkable. No pancreatic ductal dilatation or surrounding inflammatory changes. Spleen: Normal in size without focal abnormality. Adrenals/Urinary Tract: Adrenal glands are unremarkable. Kidneys are normal in size, without focal lesions. Marked severity bilateral hydronephrosis and hydroureter is seen. The urinary bladder is partially contracted and subsequently limited in evaluation. Stomach/Bowel: Stomach is within normal limits. Appendix appears normal. No evidence of bowel dilatation. There is marked severity thickening of the mid and distal sigmoid colon associated pericolonic inflammatory fat stranding is noted. Vascular/Lymphatic: No significant vascular findings are present. There is moderate severity para-aortic and aortocaval lymphadenopathy. The largest lymph node measures approximately 1.7 cm x  1.7 cm along the para-aortic region at the level of the kidney. It should be noted that this area is limited in evaluation in the absence of intravenous contrast. A 2.8 cm x 2.4 cm heterogeneous soft tissue mass is seen along the posterior aspect of the distal left iliac vessels (axial CT images 68 through 74, CT series number 2). Numerous subcentimeter soft tissue nodules are seen within the mesentery of the pelvis, along the midline (axial CT images 58 through 66, CT series number 2).  Numerous additional small soft tissue nodules are seen surrounding the distal sigmoid colon and rectosigmoid junction. Reproductive: The uterus is mildly enlarged. The uterine fundus is positioned to the left of midline within the anterior aspect of the pelvis. Other: No abdominal wall hernia or abnormality. A small amount of abdominal and pelvic fluid is noted. Musculoskeletal: No acute or significant osseous findings. IMPRESSION: 1. Marked severity sigmoid colitis. Due to the numerous areas of soft tissue nodularity within the mesentery in this region, an underlying neoplastic process cannot be excluded. Correlation with colonoscopy is recommended. 2. Heterogeneous liver masses, as described above, suspicious for an underlying neoplasm. MRI correlation is recommended. 3. Moderate severity para-aortic and aortocaval lymphadenopathy with an additional enlarged, irregular appearing lymph node seen along the posterior aspect of the distal left iliac vessels. This is also worrisome for sequelae associated with an underlying neoplasm. 4. Marked severity bilateral hydronephrosis and hydroureter which likely represents partial renal obstruction secondary to the inflammatory and suspected neoplastic process seen within the pelvis. Electronically Signed   By: Virgina Norfolk M.D.   On: 05/22/2020 20:26   DG Chest 1 View  Result Date: 05/31/2020 CLINICAL DATA:  Follow-up left pneumothorax. EXAM: CHEST  1 VIEW COMPARISON:  May 31, 2020 FINDINGS: Left-sided injectable port in stable position. Left chest tube in stable position. Cardiomediastinal silhouette is normal. Mediastinal contours appear intact. There is no evidence of focal airspace consolidation, pleural effusion. No radiographically apparent pneumothorax. Osseous structures are without acute abnormality. Soft tissues are grossly normal. IMPRESSION: 1. No radiographically apparent pneumothorax. 2. Left chest tube in stable position. Electronically Signed    By: Fidela Salisbury M.D.   On: 05/31/2020 13:31   DG Chest 1 View  Result Date: 05/31/2020 CLINICAL DATA:  Follow-up left pneumothorax. EXAM: CHEST  1 VIEW COMPARISON:  05/30/2020 and earlier studies. FINDINGS: Left-sided chest tube is stable, curled tip projecting over the lateral left hilum. No pneumothorax. Mild atelectasis extending laterally from the left hilum and along the medial left lung base, unchanged. Lungs otherwise clear. No pleural effusion. Small amount of subcutaneous air at the left neck base, stable. IMPRESSION: 1. Stable left-sided chest tube with no visible pneumothorax. 2. No acute findings. Mild stable atelectasis on the left as described. Electronically Signed   By: Lajean Manes M.D.   On: 05/31/2020 06:54   CT CHEST WO CONTRAST  Result Date: 05/27/2020 CLINICAL DATA:  New diagnosis of rectal cancer. EXAM: CT CHEST WITHOUT CONTRAST TECHNIQUE: Multidetector CT imaging of the chest was performed following the standard protocol without IV contrast. COMPARISON:  Abdominal CT 05/22/2020 FINDINGS: Cardiovascular: Normal aortic caliber. Normal heart size, without pericardial effusion. Mediastinum/Nodes: No supraclavicular adenopathy. No mediastinal or definite hilar adenopathy, given limitations of unenhanced CT. Lungs/Pleura: No pleural fluid. Left base scarring or subsegmental atelectasis. Nonspecific 2 mm right upper lobe pulmonary nodule on 29/3. Calcified granuloma of 2 mm in the right lower lobe on 85/3. Upper Abdomen: Subtle hypoattenuating hepatic dome lesion again identified. The  dominant right hepatic lobe mass is excluded. Normal imaged portions of the spleen, stomach, pancreas, adrenal glands, right kidney. Musculoskeletal: Presumed bone island within the sternal body. IMPRESSION: 1.  No acute process or evidence of metastatic disease in the chest. 2. Nonspecific tiny right upper lobe pulmonary nodule. No thoracic adenopathy. 3. Liver masses, as before. Electronically  Signed   By: Abigail Miyamoto M.D.   On: 05/27/2020 19:34   [place] Intraoperative  Result Date: 05/23/2020 INDICATION: Patient admitted with new diagnosis of malignancy of unknown etiology now with bilateral obstructive hydronephrosis. Request made for placement of bilateral percutaneous nephrostomy catheters for urinary diversion purposes. EXAM: 1. ULTRASOUND GUIDANCE FOR PUNCTURE OF THE BILATERAL RENAL COLLECTING SYSTEMS 2. BILATERAL PERCUTANEOUS NEPHROSTOMY TUBE PLACEMENT. COMPARISON:  CT abdomen and pelvis-05/22/2020 MEDICATIONS: Ancef 2 gm IV; The antibiotic was administered in an appropriate time frame prior to skin puncture. ANESTHESIA/SEDATION: Moderate (conscious) sedation was employed during this procedure. A total of Versed 4 mg and Fentanyl 100 mcg was administered intravenously. Moderate Sedation Time: 22 minutes. The patient's level of consciousness and vital signs were monitored continuously by radiology nursing throughout the procedure under my direct supervision. CONTRAST:  A total of 20 cc Visipaque 320 was administered into both renal collecting systems. FLUOROSCOPY TIME:  1 minute, 42 seconds (20.2 mGy) COMPLICATIONS: None immediate. PROCEDURE: The procedure, risks, benefits, and alternatives were explained to the patient. Questions regarding the procedure were encouraged and answered. The patient understands and consents to the procedure. A timeout was performed prior to the initiation of the procedure. The bilateral flanks were prepped and draped in the usual sterile fashion and a sterile drape was applied covering the operative field. A sterile gown and sterile gloves were used for the procedure. Local anesthesia was provided with 1% Lidocaine with epinephrine. Attention was first paid towards placement of the left-sided percutaneous nephrostomy catheter. Ultrasound was used to localize the left kidney. Under direct ultrasound guidance, a 20 gauge needle was advanced into the renal collecting  system. An ultrasound image documentation was performed. Access within the collecting system was confirmed with the efflux of urine followed by limited contrast injection. Over a Nitrex wire, the tract was dilated with an Accustick stent. Next, under intermittent fluoroscopic guidance and over a short Amplatz wire, the track was dilated ultimately allowing placement of a 10-French percutaneous nephrostomy catheter which was advanced to the level of the renal pelvis where the coil was formed and locked. Contrast was injected and several spot fluoroscopic images were obtained in various obliquities. The catheter was secured at the skin with a Prolene retention suture and stat lock device and connected to a gravity bag was placed. The identical procedure was repeated for the contralateral right kidney allowing placement of a 10 French percutaneous nephrostomy catheter with end ultimately coiled and locked within the right renal pelvis. Dressings were applied. The patient tolerated procedure well without immediate postprocedural complication. 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 Under a combination of ultrasound and fluoroscopic guidance, a posterior inferior calix was targeted bilaterally allowing placement of bilateral 10 French percutaneous nephrostomy catheters with ends coiled and locked within the bilateral renal pelvises. Contrast injection confirmed appropriate positioning. IMPRESSION: Successful ultrasound and fluoroscopic guided placement of bilateral 10 French PCNs. Electronically Signed   By: Sandi Mariscal M.D.   On: 05/23/2020 12:55   US RENAL  Result Date: 05/23/2020 CLINICAL DATA:  Acute kidney injury EXAM: RENAL / URINARY TRACT ULTRASOUND COMPLETE COMPARISON:  CT 05/22/2020 FINDINGS: Right Kidney: Renal measurements: 11.5 x 5.2 x 5.5 cm = volume: 170  mL. Mild renal cortical thinning with increased cortical echogenicity. Severe right hydronephrosis. The visualized right ureter is moderately dilated. No discrete shadowing stone or mass lesion identified. Left Kidney: Renal measurements: 12.5 x 5.2 x 4.7 cm = volume: 162 mL. Cortical thickness within normal limits. Increased renal cortical echogenicity. Moderate to severe hydronephrosis. Visualized left ureter is mildly dilated. No discrete shadowing stone or mass lesion identified. Bladder: Partially distended.  No definite abnormality. Other: Inferior right lower lobe solid intrahepatic mass measuring up to 6.0 cm. Irregular and heterogeneous appearance of the uterus is partially visualized on images within the pelvis, incompletely characterized. IMPRESSION: 1. Severe right hydroureteronephrosis. 2. Moderate to severe left hydronephrosis. 3. Increased renal cortical echogenicity bilaterally suggesting medical renal disease. 4. Very irregular appearance of visualized portion of the uterus noted on transabdominal imaging through the pelvis. Recommend dedicated pelvic ultrasound for further evaluation. 5. Solid 6.0 cm inferior right hepatic lobe mass most suggestive of metastatic disease based on the previous CT. Electronically Signed   By: Davina Poke D.O.   On: 05/23/2020 10:04   US BIOPSY (LIVER)  Result Date: 05/26/2020 INDICATION: Rectal mass with associated liver masses. EXAM: ULTRASOUND GUIDED CORE BIOPSY OF LIVER MEDICATIONS: None. ANESTHESIA/SEDATION: Fentanyl 1.0 mcg IV; Versed 50 mg IV Moderate Sedation Time:  15 minutes. The patient was continuously monitored during the procedure by the interventional radiology nurse under my direct supervision. PROCEDURE: The procedure, risks, benefits, and alternatives were explained to the patient. Questions regarding the procedure were encouraged and answered. The patient understands and consents to the procedure. A time-out was performed prior to  initiating the procedure. Ultrasound was performed to localize liver lesions. The abdominal wall was prepped with chlorhexidine in a sterile fashion, and a sterile drape was applied covering the operative field. A sterile gown and sterile gloves were used for the procedure. Local anesthesia was provided with 1% Lidocaine. Under ultrasound guidance, a 43 gauge trocar needle was advanced into the liver at the level of a lesion within the right lobe. Three separate coaxial 18 gauge core biopsy samples were obtained with an 18 gauge device. Material was submitted in formalin. Gel-Foam pledgets were advanced through the outer needle as it was retracted and removed. Additional ultrasound was performed. COMPLICATIONS: None immediate. FINDINGS: The largest mass in the liver as noted by CT is within the inferior aspect of the right lobe. By ultrasound this lesion measures approximately 6.5 x 5.1 x 6.1 cm. Solid tissue was obtained. IMPRESSION: Ultrasound-guided core biopsy performed at the level of a 6.5 cm mass within the inferior aspect of the right lobe of the liver. Electronically Signed   By: Aletta Edouard M.D.   On: 05/26/2020 15:27   DG Chest Port 1 View  Result Date: 05/30/2020 CLINICAL DATA:  Follow-up pneumothorax EXAM: PORTABLE CHEST 1 VIEW COMPARISON:  Yesterday FINDINGS: Pigtail catheter over the left mid chest with no visible pneumothorax remaining. Small volume soft tissue gas at the left apex. Left-sided porta catheter with tip at the distal left brachiocephalic. Retrocardiac atelectasis. Artifact from EKG leads. IMPRESSION: No visible residual pneumothorax. Electronically Signed   By: Monte Fantasia M.D.   On: 05/30/2020 04:31   DG Chest Port 1 View  Result Date: 05/29/2020 CLINICAL DATA:  Follow-up pneumothorax EXAM: PORTABLE CHEST 1 VIEW COMPARISON:  Earlier same day FINDINGS: Left  Port-A-Cath remains in place. Left pneumothorax is larger, increased from about 10% to about 25%. No evidence of  tension or mediastinal shift. Mild atelectasis the left lung base. IMPRESSION: Enlarging left pneumothorax, 10% to about 25%. No evidence of tension or mediastinal shift. These results will be called to the ordering clinician or representative by the Radiologist Assistant, and communication documented in the PACS or [employer]. Electronically Signed   By: Nelson Chimes M.D.   On: 05/29/2020 19:20   DG Chest Port 1 View  Result Date: 05/29/2020 CLINICAL DATA:  55 year old female status post Port-A-Cath placement. EXAM: PORTABLE CHEST 1 VIEW COMPARISON:  Chest CT dated 05/27/2020. FINDINGS: Left-sided Port-A-Cath with tip over central SVC. There is a small left apical pneumothorax measuring approximately 2.4 cm in thickness. No focal consolidation, or pleural effusion. The cardiac silhouette is within limits. No acute osseous pathology. Small pockets of soft tissue air noted over the left clavicle. IMPRESSION: Status post Port-A-Cath placement.  Small left apical pneumothorax. These results were called by telephone at the time of interpretation on 05/29/2020 at 4:26 pm to nurse Maree Krabbe, who verbally acknowledged these results. Electronically Signed   By: Anner Crete M.D.   On: 05/29/2020 16:33   DG C-Arm 1-60 Min-No Report  Result Date: 05/29/2020 Fluoroscopy was utilized by the requesting physician.  No radiographic interpretation.   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 Request made for placement of bilateral percutaneous nephrostomy catheters for urinary diversion purposes. EXAM: 1. ULTRASOUND GUIDANCE FOR PUNCTURE OF THE BILATERAL RENAL COLLECTING SYSTEMS 2. BILATERAL PERCUTANEOUS NEPHROSTOMY TUBE PLACEMENT. COMPARISON:  CT abdomen and pelvis-05/22/2020 MEDICATIONS: Ancef 2 gm IV; The antibiotic was administered in an appropriate time frame  prior to skin puncture. ANESTHESIA/SEDATION: Moderate (conscious) sedation was employed during this procedure. A total of Versed 4 mg and Fentanyl 100 mcg was administered intravenously. Moderate Sedation Time: 22 minutes. The patient's level of consciousness and vital signs were monitored continuously by radiology nursing throughout the procedure under my direct supervision. CONTRAST:  A total of 20 cc Visipaque 320 was administered into both renal collecting systems. FLUOROSCOPY TIME:  1 minute, 42 seconds (70.0 mGy) COMPLICATIONS: None immediate. PROCEDURE: The procedure, risks, benefits, and alternatives were explained to the patient. Questions regarding the procedure were encouraged and answered. The patient understands and consents to the procedure. A timeout was performed prior to the initiation of the procedure. The bilateral flanks were prepped and draped in the usual sterile fashion and a sterile drape was applied covering the operative field. A sterile gown and sterile gloves were used for the procedure. Local anesthesia was provided with 1% Lidocaine with epinephrine. Attention was first paid towards placement of the left-sided percutaneous nephrostomy catheter. Ultrasound was used to localize the left kidney. Under direct ultrasound guidance, a 20 gauge needle was advanced into the renal collecting system. An ultrasound image documentation was performed. Access within the collecting system was confirmed with the efflux of urine followed by limited contrast injection. Over a Nitrex wire, the tract was dilated with an Accustick stent. Next, under intermittent fluoroscopic guidance and over a short Amplatz wire, the track was dilated ultimately allowing placement of a 10-French percutaneous nephrostomy catheter which was advanced to the level of the renal pelvis where the coil was formed and locked. Contrast was injected and several spot fluoroscopic images were obtained in various obliquities. The  catheter was secured  at the skin with a Prolene retention suture and stat lock device and connected to a gravity bag was placed. The identical procedure was repeated for the contralateral right kidney allowing placement of a 10 French percutaneous nephrostomy catheter with end ultimately coiled and locked within the right renal pelvis. Dressings were applied. The patient tolerated procedure well without immediate postprocedural complication. 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 Under a combination of ultrasound and fluoroscopic guidance, a posterior inferior calix was targeted bilaterally allowing placement of bilateral 10 French percutaneous nephrostomy catheters with ends coiled and locked within the bilateral renal pelvises. Contrast injection confirmed appropriate positioning. IMPRESSION: Successful ultrasound and fluoroscopic guided placement of bilateral 10 French PCNs. Electronically Signed   By: Sandi Mariscal M.D.   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 Request made for placement of bilateral percutaneous nephrostomy catheters for urinary diversion purposes. EXAM: 1. ULTRASOUND GUIDANCE FOR PUNCTURE OF THE BILATERAL RENAL COLLECTING SYSTEMS 2. BILATERAL PERCUTANEOUS NEPHROSTOMY TUBE PLACEMENT. COMPARISON:  CT abdomen and pelvis-05/22/2020 MEDICATIONS: Ancef 2 gm IV; The antibiotic was administered in an appropriate time frame prior to skin puncture. ANESTHESIA/SEDATION: Moderate (conscious) sedation was employed during this procedure. A total of Versed 4 mg and Fentanyl 100 mcg was administered intravenously. Moderate Sedation Time: 22 minutes. The patient's level of consciousness and vital  signs were monitored continuously by radiology nursing throughout the procedure under my direct supervision. CONTRAST:  A total of 20 cc Visipaque 320 was administered into both renal collecting systems. FLUOROSCOPY TIME:  1 minute, 42 seconds (38.1 mGy) COMPLICATIONS: None immediate. PROCEDURE: The procedure, risks, benefits, and alternatives were explained to the patient. Questions regarding the procedure were encouraged and answered. The patient understands and consents to the procedure. A timeout was performed prior to the initiation of the procedure. The bilateral flanks were prepped and draped in the usual sterile fashion and a sterile drape was applied covering the operative field. A sterile gown and sterile gloves were used for the procedure. Local anesthesia was provided with 1% Lidocaine with epinephrine. Attention was first paid towards placement of the left-sided percutaneous nephrostomy catheter. Ultrasound was used to localize the left kidney. Under direct ultrasound guidance, a 20 gauge needle was advanced into the renal collecting system. An ultrasound image documentation was performed. Access within the collecting system was confirmed with the efflux of urine followed by limited contrast injection. Over a Nitrex wire, the tract was dilated with an Accustick stent. Next, under intermittent fluoroscopic guidance and over a short Amplatz wire, the track was dilated ultimately allowing placement of a 10-French percutaneous nephrostomy catheter which was advanced to the level of the renal pelvis where the coil was formed and locked. Contrast was injected and several spot fluoroscopic images were obtained in various obliquities. The catheter was secured at the skin with a Prolene retention suture and stat lock device and connected to a gravity bag was placed. The identical procedure was repeated for the contralateral right kidney allowing placement of a 10 French percutaneous nephrostomy catheter with end  ultimately coiled and locked within the right renal pelvis. Dressings were applied. The patient tolerated procedure well without immediate postprocedural complication. FINDINGS: Ultrasound scanning demonstrates a moderate to severely dilatation of the bilateral renal collecting systems, right greater than  left, as demonstrated on preceding abdominal CT. Under a combination of ultrasound and fluoroscopic guidance, a posterior inferior calix was targeted bilaterally allowing placement of bilateral 10 French percutaneous nephrostomy catheters with ends coiled and locked within the bilateral renal pelvises. Contrast injection confirmed appropriate positioning. IMPRESSION: Successful ultrasound and fluoroscopic guided placement of bilateral 10 French PCNs. Electronically Signed   By: Sandi Mariscal M.D.   On: 05/23/2020 12:55    ASSESSMENT: Metastatic colorectal adenocarcinoma.  PLAN:   1. Metastatic colorectal adenocarcinoma: Although unclear whether malignancy originated in colon or rectum, given the pattern of spread to liver we will treat as a stage IV colon cancer.  CEA is only mildly elevated at 9.9.  Patient has had port placement.  Plan to treat with FOLFOX plus Avastin every 2 weeks.  Will hold Avastin for the first 1 or 2 cycles given her recent GI bleed.  Return to clinic tomorrow for FOLFOX as well as palliative care consult.  Return to clinic on Friday, June 06, 2020 for pump removal.  She will then return to clinic on Wednesday, June 11, 2020 for repeat laboratory work and further evaluation.  Will also refer patient for genetic testing. 2.  Anemia: Hemoglobin is improved.  Patient does not require a transfusion.  Continue to monitor closely.    I spent a total of 30 minutes reviewing chart data, face-to-face evaluation with the patient, counseling and coordination of care as detailed above.   Patient expressed understanding and was in agreement with this plan. She also understands that She  can call clinic at any time with any questions, concerns, or complaints.   Cancer Staging Metastatic colorectal cancer [facility]) Staging form: Colon and Rectum, AJCC 8th Edition - Clinical stage from 05/31/2020: Stage IVC (cTX, cNX, pM1c) - Signed by [name], MD on 05/31/2020   [name], MD   06/03/2020 4:24 PM

## 2020-05-31 NOTE — Progress Notes (Signed)
[facility]  ROUNDING NOTE   Subjective:   Patient reports that she is trying to increase her fluid intake. She reports that she has chronic diarrhea.  Port placement was complicated with left sided pneumothorax. She is wanting to have the chest tube removed due to discomfort.  Patient is in good spirits.   She has questions regarding upkeep of nephrostomy tubes.   Objective:  Vital signs in last 24 hours:  Temp:  [97.5 F (36.4 C)-98.2 F (36.8 C)] 97.5 F (36.4 C) (10/30 0751) Pulse Rate:  [76-93] 76 (10/30 0751) Resp:  [16-20] 20 (10/30 0751) BP: (94-109)/(55-70) 109/64 (10/30 0751) SpO2:  [96 %-100 %] 96 % (10/30 0751)  Weight change:  Filed Weights   05/22/20 1537 05/27/20 1108 05/28/20 1047  Weight: 59 kg 59 kg 59 kg    Intake/Output: I/O last 3 completed shifts: In: 120 [P.O.:120] Out: 1158 [Urine:1150; Chest Tube:8]   Intake/Output this shift:  Total I/O In: 0  Out: 506 [Urine:500; Chest Tube:6]  Physical Exam: General: NAD,   Head: Normocephalic, atraumatic. Moist oral mucosal membranes  Eyes: Anicteric, PERRL  Neck: Supple, trachea midline  Lungs:  Clear to auscultation, chest tube in place   Heart: Regular rate and rhythm  Abdomen:  Soft, nontender,   Extremities:  No peripheral edema.  Neurologic: Nonfocal, moving all four extremities  Skin: No lesions  Urinary Bilateral nephrostomy tubes     Basic Metabolic Panel: Recent Labs  Lab 05/26/20 0450 05/26/20 0450 05/27/20 0547 05/27/20 0547 05/28/20 0451 05/28/20 0451 05/29/20 0612 05/29/20 0612 05/29/20 7026 05/30/20 0540 05/31/20 0543  NA 144   < > 138   < > 139  --  141  --  140 139 141  K 3.3*   < > 3.6   < > 3.3*  --  3.5  --  3.4* 3.9 3.4*  CL 108   < > 103   < > 102  --  105  --  103 102 103  CO2 25   < > 25   < > 28  --  28  --  28 26 30   GLUCOSE 100*   < > 116*   < > 105*  --  105*  --  102* 145* 94  BUN 46*   < > 26*   < > 18  --  16  --  16 18 23*  CREATININE  4.83*   < > 2.99*   < > 2.19*  --  2.08*  --  2.15* 1.87* 1.70*  CALCIUM 8.5*   < > 8.4*   < > 8.3*   < > 8.2*   < > 8.1* 8.4* 8.0*  MG 1.9  --   --   --   --   --   --   --   --   --   --   PHOS 4.1  --  2.9  --  2.9  --   --   --  2.2* 3.1  --    < > = values in this interval not displayed.    Liver Function Tests: Recent Labs  Lab 05/27/20 0547 05/28/20 0451 05/29/20 0612 05/29/20 0613 05/30/20 0540  AST  --   --  19  --   --   ALT  --   --  7  --   --   ALKPHOS  --   --  43  --   --   BILITOT  --   --  0.5  --   --   PROT  --   --  6.8  --   --   ALBUMIN 2.7* 2.5* 2.6* 2.6* 2.6*   No results for input(s): LIPASE, AMYLASE in the last 168 hours. No results for input(s): AMMONIA in the last 168 hours.  CBC: Recent Labs  Lab 05/27/20 0547 05/28/20 0451 05/29/20 0612 05/30/20 0539 05/31/20 0543  WBC 9.8 8.1 8.9 11.2* 10.1  NEUTROABS  --   --  6.6  --  7.5  HGB 9.3* 8.7* 8.5* 8.7* 8.4*  HCT 28.8* 26.7* 26.9* 26.8* 26.7*  MCV 81.6 82.9 83.3 82.7 84.2  PLT 423* 341 320 348 360    Cardiac Enzymes: No results for input(s): CKTOTAL, CKMB, CKMBINDEX, TROPONINI in the last 168 hours.  BNP: Invalid input(s): POCBNP  CBG: No results for input(s): GLUCAP in the last 168 hours.  Microbiology: Results for orders placed or performed during the hospital encounter of 05/22/20  Respiratory Panel by RT PCR (Flu A&B, Covid) - Nasopharyngeal Swab     Status: None   Collection Time: 05/22/20  7:34 PM   Specimen: Nasopharyngeal Swab  Result Value Ref Range Status   SARS Coronavirus 2 by RT PCR NEGATIVE NEGATIVE Final    Comment: (NOTE) SARS-CoV-2 target nucleic acids are NOT DETECTED.  The SARS-CoV-2 RNA is generally detectable in upper respiratoy specimens during the acute phase of infection. The lowest concentration of SARS-CoV-2 viral copies this assay can detect is 131 copies/mL. A negative result does not preclude SARS-Cov-2 infection and should not be used as the sole  basis for treatment or other patient management decisions. A negative result may occur with  improper specimen collection/handling, submission of specimen other than nasopharyngeal swab, presence of viral mutation(s) within the areas targeted by this assay, and inadequate number of viral copies (<131 copies/mL). A negative result must be combined with clinical observations, patient history, and epidemiological information. The expected result is Negative.  Fact Sheet for Patients:  PinkCheek.be  Fact Sheet for Healthcare Providers:  GravelBags.it  This test is no t yet approved or cleared by the [place] FDA and  has been authorized for detection and/or diagnosis of SARS-CoV-2 by FDA under an Emergency Use Authorization (EUA). This EUA will remain  in effect (meaning this test can be used) for the duration of the COVID-19 declaration under Section 564(b)(1) of the Act, 21 U.S.C. section 360bbb-3(b)(1), unless the authorization is terminated or revoked sooner.     Influenza A by PCR NEGATIVE NEGATIVE Final   Influenza B by PCR NEGATIVE NEGATIVE Final    Comment: (NOTE) The Xpert Xpress SARS-CoV-2/FLU/RSV assay is intended as an aid in  the diagnosis of influenza from Nasopharyngeal swab specimens and  should not be used as a sole basis for treatment. Nasal washings and  aspirates are unacceptable for Xpert Xpress SARS-CoV-2/FLU/RSV  testing.  Fact Sheet for Patients: PinkCheek.be  Fact Sheet for Healthcare Providers: GravelBags.it  This test is not yet approved or cleared by the [place] FDA and  has been authorized for detection and/or diagnosis of SARS-CoV-2 by  FDA under an Emergency Use Authorization (EUA). This EUA will remain  in effect (meaning this test can be used) for the duration of the  Covid-19 declaration under Section 564(b)(1) of the  Act, 21  U.S.C. section 360bbb-3(b)(1), unless the authorization is  terminated or revoked. Performed at [facility], [address] [place], [place] [address]   Gastrointestinal Panel by PCR ,  Stool     Status: None   Collection Time: 05/24/20  1:00 AM   Specimen: Stool  Result Value Ref Range Status   Campylobacter species NOT DETECTED NOT DETECTED Final   Plesimonas shigelloides NOT DETECTED NOT DETECTED Final   Salmonella species NOT DETECTED NOT DETECTED Final   Yersinia enterocolitica NOT DETECTED NOT DETECTED Final   Vibrio species NOT DETECTED NOT DETECTED Final   Vibrio cholerae NOT DETECTED NOT DETECTED Final   Enteroaggregative E coli (EAEC) NOT DETECTED NOT DETECTED Final   Enteropathogenic E coli (EPEC) NOT DETECTED NOT DETECTED Final   Enterotoxigenic E coli (ETEC) NOT DETECTED NOT DETECTED Final   Shiga like toxin producing E coli (STEC) NOT DETECTED NOT DETECTED Final   Shigella/Enteroinvasive E coli (EIEC) NOT DETECTED NOT DETECTED Final   Cryptosporidium NOT DETECTED NOT DETECTED Final   Cyclospora cayetanensis NOT DETECTED NOT DETECTED Final   Entamoeba histolytica NOT DETECTED NOT DETECTED Final   Giardia lamblia NOT DETECTED NOT DETECTED Final   Adenovirus F40/41 NOT DETECTED NOT DETECTED Final   Astrovirus NOT DETECTED NOT DETECTED Final   Norovirus GI/GII NOT DETECTED NOT DETECTED Final   Rotavirus A NOT DETECTED NOT DETECTED Final   Sapovirus (I, II, IV, and V) NOT DETECTED NOT DETECTED Final    Comment: Performed at [facility], [address] [place], [place] [address]  C Difficile Quick Screen w PCR reflex     Status: None   Collection Time: 05/24/20  1:00 AM   Specimen: Stool  Result Value Ref Range Status   C Diff antigen NEGATIVE NEGATIVE Final   C Diff toxin NEGATIVE NEGATIVE Final   C Diff interpretation No C. difficile detected.  Final    Comment: Performed at [facility], [address] [place], [place]  [address]  Urine Culture     Status: None   Collection Time: 05/25/20  4:31 AM   Specimen: Urine, Catheterized  Result Value Ref Range Status   Specimen Description   Final    URINE, CATHETERIZED Performed at [name], [address], [place], [place] [address]    Special Requests   Final    NONE Performed at [facility], [address] [place], [place] [address]    Culture   Final    NO GROWTH Performed at [facility], [address] [place], [place] [address]    Report Status 05/26/2020 FINAL  Final  Surgical pcr screen     Status: None   Collection Time: 05/29/20  5:54 AM   Specimen: Nasal Mucosa; Nasal Swab  Result Value Ref Range Status   MRSA, PCR NEGATIVE NEGATIVE Final   Staphylococcus aureus NEGATIVE NEGATIVE Final    Comment: (NOTE) The Xpert SA Assay (FDA approved for NASAL specimens in patients 67 years of age and older), is one component of a comprehensive surveillance program. It is not intended to diagnose infection nor to guide or monitor treatment. Performed at [facility], [address] [place], [place] [address]     Coagulation Studies: No results for input(s): LABPROT, INR in the last 72 hours.  Urinalysis: No results for input(s): COLORURINE, LABSPEC, PHURINE, GLUCOSEU, HGBUR, BILIRUBINUR, KETONESUR, PROTEINUR, UROBILINOGEN, NITRITE, LEUKOCYTESUR in the last 72 hours.  Invalid input(s): APPERANCEUR    Imaging: DG Chest 1 View  Result Date: 05/31/2020 CLINICAL DATA:  Follow-up left pneumothorax. EXAM: CHEST  1 VIEW COMPARISON:  05/30/2020 and earlier studies. FINDINGS: Left-sided chest tube is stable,  curled tip projecting over the lateral left hilum. No pneumothorax. Mild atelectasis extending laterally from the left hilum and along the medial left lung base, unchanged. Lungs otherwise clear. No pleural effusion. Small amount of subcutaneous air at the left neck base, stable. IMPRESSION: 1. Stable  left-sided chest tube with no visible pneumothorax. 2. No acute findings. Mild stable atelectasis on the left as described. Electronically Signed   By: Lajean Manes M.D.   On: 05/31/2020 06:54   DG Chest Port 1 View  Result Date: 05/30/2020 CLINICAL DATA:  Follow-up pneumothorax EXAM: PORTABLE CHEST 1 VIEW COMPARISON:  Yesterday FINDINGS: Pigtail catheter over the left mid chest with no visible pneumothorax remaining. Small volume soft tissue gas at the left apex. Left-sided porta catheter with tip at the distal left brachiocephalic. Retrocardiac atelectasis. Artifact from EKG leads. IMPRESSION: No visible residual pneumothorax. Electronically Signed   By: Monte Fantasia M.D.   On: 05/30/2020 04:31   DG Chest Port 1 View  Result Date: 05/29/2020 CLINICAL DATA:  Follow-up pneumothorax EXAM: PORTABLE CHEST 1 VIEW COMPARISON:  Earlier same day FINDINGS: Left Port-A-Cath remains in place. Left pneumothorax is larger, increased from about 10% to about 25%. No evidence of tension or mediastinal shift. Mild atelectasis the left lung base. IMPRESSION: Enlarging left pneumothorax, 10% to about 25%. No evidence of tension or mediastinal shift. These results will be called to the ordering clinician or representative by the Radiologist Assistant, and communication documented in the PACS or [employer]. Electronically Signed   By: Nelson Chimes M.D.   On: 05/29/2020 19:20   DG Chest Port 1 View  Result Date: 05/29/2020 CLINICAL DATA:  54 year old female status post Port-A-Cath placement. EXAM: PORTABLE CHEST 1 VIEW COMPARISON:  Chest CT dated 05/27/2020. FINDINGS: Left-sided Port-A-Cath with tip over central SVC. There is a small left apical pneumothorax measuring approximately 2.4 cm in thickness. No focal consolidation, or pleural effusion. The cardiac silhouette is within limits. No acute osseous pathology. Small pockets of soft tissue air noted over the left clavicle. IMPRESSION: Status post Port-A-Cath  placement.  Small left apical pneumothorax. These results were called by telephone at the time of interpretation on 05/29/2020 at 4:26 pm to nurse Maree Krabbe, who verbally acknowledged these results. Electronically Signed   By: Anner Crete M.D.   On: 05/29/2020 16:33   DG C-Arm 1-60 Min-No Report  Result Date: 05/29/2020 Fluoroscopy was utilized by the requesting physician.  No radiographic interpretation.     Medications:   . sodium chloride     . calcium acetate  667 mg Oral TID WC  . pantoprazole (PROTONIX) IV  40 mg Intravenous Q12H  . potassium chloride  20 mEq Oral Once  . sodium bicarbonate  650 mg Oral TID   acetaminophen, HYDROcodone-acetaminophen, ondansetron **OR** ondansetron (ZOFRAN) IV, oxyCODONE  Assessment/ Plan:  [name] is a 54 y.o.  female  presented to the emergency room with the chief complaint of rectal bleeding for last 3 months.  Patient was consulted for nephrology for acute kidney injury.  She has bilateral nephrostomy tubes, placed during this admission.  1. AKI secondary to obstruction  - urine output today at 506 mL  - Renal function is improving, Creatinine 1.7. creatinine peaked at 10.74.  - unclear of baseline creatinine. Creatinine 0.62 in 2018 from [facility], I am unable to find any more recent labs.  - nephrostomy tubes are draining well. Urine appears to be light yellow, not cloudy, no evidence of hematuria.  2. Normocytic Anemia   - Hgb 8.4.     LOS: [place] 10/30/202112:56 PM

## 2020-05-31 NOTE — Progress Notes (Signed)
[facility] Hospital Day(s): 9.   Post op day(s): 2 Days Post-Op.   Interval History: Patient seen and examined, no acute events or new complaints overnight. Patient evaluated in the morning and found with chest tube in place. No air leak. Patient complaining of pain around chest tube. Chest xray this morning shows no pneumothorax.   Vital signs in last 24 hours: [min-max] current  Temp:  [97.5 F (36.4 C)-98.5 F (36.9 C)] 98.2 F (36.8 C) (10/30 1956) Pulse Rate:  [76-101] 94 (10/30 1956) Resp:  [16-20] 16 (10/30 1956) BP: (98-117)/(55-70) 104/62 (10/30 1956) SpO2:  [96 %-99 %] 99 % (10/30 1956)     Height: 5' (152.4 cm) Weight: 59 kg BMI (Calculated): 25.39   Physical Exam:  Constitutional: alert, cooperative and no distress  Respiratory: breathing non-labored at rest  Cardiovascular: regular rate and sinus rhythm   Labs:  CBC Latest Ref Rng & Units 05/31/2020 05/30/2020 05/29/2020  WBC 4.0 - 10.5 K/uL 10.1 11.2(H) 8.9  Hemoglobin 12.0 - 15.0 g/dL 8.4(L) 8.7(L) 8.5(L)  Hematocrit 36 - 46 % 26.7(L) 26.8(L) 26.9(L)  Platelets 150 - 400 K/uL 360 348 320   CMP Latest Ref Rng & Units 05/31/2020 05/30/2020 05/29/2020  Glucose 70 - 99 mg/dL 94 145(H) 102(H)  BUN 6 - 20 mg/dL 23(H) 18 16  Creatinine 0.44 - 1.00 mg/dL 1.70(H) 1.87(H) 2.15(H)  Sodium 135 - 145 mmol/L 141 139 140  Potassium 3.5 - 5.1 mmol/L 3.4(L) 3.9 3.4(L)  Chloride 98 - 111 mmol/L 103 102 103  CO2 22 - 32 mmol/L 30 26 28   Calcium 8.9 - 10.3 mg/dL 8.0(L) 8.4(L) 8.1(L)  Total Protein 6.5 - 8.1 g/dL - - -  Total Bilirubin 0.3 - 1.2 mg/dL - - -  Alkaline Phos 38 - 126 U/L - - -  AST 15 - 41 U/L - - -  ALT 0 - 44 U/L - - -    Imaging studies:  EXAM: CHEST  1 VIEW  COMPARISON:  May 31, 2020  FINDINGS: Left-sided injectable port in stable position. Left chest tube in stable position.  Cardiomediastinal silhouette is normal. Mediastinal contours appear intact.  There is no  evidence of focal airspace consolidation, pleural effusion. No radiographically apparent pneumothorax.  Osseous structures are without acute abnormality. Soft tissues are grossly normal.  IMPRESSION: 1. No radiographically apparent pneumothorax. 2. Left chest tube in stable position.   Assessment/Plan:  54 y.o. female with metastatic colorectal cancer that developed small pneumothorax after central line placement.  Morning xray shows no pneumothorax. Chest tube placed at water seal. Chest xray repeated after hours at water seal and there was no recurrent pneumothorax. Chest tube removed. Patient feeling more comfortable after chest tube was removed. No complication from chest tube removal. No further surgical management. No contraindication from discharge from surgery standpoint.    Arnold Long, MD

## 2020-06-01 LAB — BASIC METABOLIC PANEL
Anion gap: 9 (ref 5–15)
BUN: 24 mg/dL — ABNORMAL HIGH (ref 6–20)
CO2: 29 mmol/L (ref 22–32)
Calcium: 8.1 mg/dL — ABNORMAL LOW (ref 8.9–10.3)
Chloride: 102 mmol/L (ref 98–111)
Creatinine, Ser: 1.67 mg/dL — ABNORMAL HIGH (ref 0.44–1.00)
GFR, Estimated: 37 mL/min — ABNORMAL LOW (ref >60)
Glucose, Bld: 93 mg/dL (ref 70–99)
Potassium: 3.4 mmol/L — ABNORMAL LOW (ref 3.5–5.1)
Sodium: 140 mmol/L (ref 135–145)

## 2020-06-01 MED ORDER — PANTOPRAZOLE SODIUM 40 MG PO TBEC
40.0000 mg | DELAYED_RELEASE_TABLET | Freq: Two times a day (BID) | ORAL | 0 refills | Status: DC
Start: 2020-06-01 — End: 2020-11-05

## 2020-06-01 MED ORDER — OXYCODONE HCL 5 MG PO TABS: 5.0000 mg | ORAL_TABLET | ORAL | 0 refills | Status: DC | PRN

## 2020-06-01 MED ORDER — POTASSIUM CHLORIDE CRYS ER 20 MEQ PO TBCR: 20.0000 meq | EXTENDED_RELEASE_TABLET | Freq: Once | ORAL | 0 refills | Status: DC

## 2020-06-01 NOTE — Discharge Summary (Signed)
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 The patient understands and consents to the procedure. A timeout was performed prior to the initiation of the procedure. The bilateral flanks were prepped and draped in the usual sterile fashion and a sterile drape was applied covering the operative field. A sterile gown and sterile gloves were used for the procedure. Local anesthesia was provided with 1% Lidocaine with epinephrine. Attention was first paid towards placement of the left-sided percutaneous nephrostomy catheter. Ultrasound was used to localize the left kidney. Under direct ultrasound guidance, a 20 gauge needle was advanced into the renal collecting system. An ultrasound image documentation was performed. Access within the collecting system was confirmed with the efflux of urine followed by limited contrast injection. Over a Nitrex wire, the tract was dilated with an Accustick stent. Next, under intermittent fluoroscopic guidance and over a short Amplatz wire, the track was dilated ultimately allowing placement of a 10-French percutaneous nephrostomy catheter which was advanced to the level of  the renal pelvis where the coil was formed and locked. Contrast was injected and several spot fluoroscopic images were obtained in various obliquities. The catheter was secured at the skin with a Prolene retention suture and stat lock device and connected to a gravity bag was placed. The identical procedure was repeated for the contralateral right kidney allowing placement of a 10 French percutaneous nephrostomy catheter with end ultimately coiled and locked within the right renal pelvis. Dressings were applied. The patient tolerated procedure well without immediate postprocedural complication. 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 Under a combination of ultrasound and fluoroscopic guidance, a posterior inferior calix was targeted bilaterally allowing placement of bilateral 10 French percutaneous nephrostomy catheters with ends coiled and locked within the bilateral renal pelvises. Contrast injection confirmed appropriate positioning. IMPRESSION: Successful ultrasound and fluoroscopic guided placement of bilateral 10 French PCNs. Electronically Signed   By: Sandi Mariscal M.D.   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 Request made for placement of bilateral percutaneous nephrostomy catheters for urinary diversion purposes. EXAM: 1. ULTRASOUND GUIDANCE FOR PUNCTURE OF THE BILATERAL RENAL COLLECTING SYSTEMS 2. BILATERAL PERCUTANEOUS NEPHROSTOMY TUBE PLACEMENT. COMPARISON:  CT abdomen and pelvis-05/22/2020 MEDICATIONS: Ancef 2 gm IV; The antibiotic was administered in an appropriate time frame prior to skin puncture. ANESTHESIA/SEDATION: Moderate (  conscious) sedation was employed during this procedure. A  total of Versed 4 mg and Fentanyl 100 mcg was administered intravenously. Moderate Sedation Time: 22 minutes. The patient's level of consciousness and vital signs were monitored continuously by radiology nursing throughout the procedure under my direct supervision. CONTRAST:  A total of 20 cc Visipaque 320 was administered into both renal collecting systems. FLUOROSCOPY TIME:  1 minute, 42 seconds (78.4 mGy) COMPLICATIONS: None immediate. PROCEDURE: The procedure, risks, benefits, and alternatives were explained to the patient. Questions regarding the procedure were encouraged and answered. The patient understands and consents to the procedure. A timeout was performed prior to the initiation of the procedure. The bilateral flanks were prepped and draped in the usual sterile fashion and a sterile drape was applied covering the operative field. A sterile gown and sterile gloves were used for the procedure. Local anesthesia was provided with 1% Lidocaine with epinephrine. Attention was first paid towards placement of the left-sided percutaneous nephrostomy catheter. Ultrasound was used to localize the left kidney. Under direct ultrasound guidance, a 20 gauge needle was advanced into the renal collecting system. An ultrasound image documentation was performed. Access within the collecting system was confirmed with the efflux of urine followed by limited contrast injection. Over a Nitrex wire, the tract was dilated with an Accustick stent. Next, under intermittent fluoroscopic guidance and over a short Amplatz wire, the track was dilated ultimately allowing placement of a 10-French percutaneous nephrostomy catheter which was advanced to the level of the renal pelvis where the coil was formed and locked. Contrast was injected and several spot fluoroscopic images were obtained in various obliquities. The catheter was secured at the skin with a Prolene retention suture and stat lock device and connected to a gravity bag was  placed. The identical procedure was repeated for the contralateral right kidney allowing placement of a 10 French percutaneous nephrostomy catheter with end ultimately coiled and locked within the right renal pelvis. Dressings were applied. The patient tolerated procedure well without immediate postprocedural complication. 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 Under a combination of ultrasound and fluoroscopic guidance, a posterior inferior calix was targeted bilaterally allowing placement of bilateral 10 French percutaneous nephrostomy catheters with ends coiled and locked within the bilateral renal pelvises. Contrast injection confirmed appropriate positioning. IMPRESSION: Successful ultrasound and fluoroscopic guided placement of bilateral 10 French PCNs. Electronically Signed   By: Sandi Mariscal M.D.   On: 05/23/2020 12:55    Microbiology: Recent Results (from the past 240 hour(s))  Respiratory Panel by RT PCR (Flu A&B, Covid) - Nasopharyngeal Swab     Status: None   Collection Time: 05/22/20  7:34 PM   Specimen: Nasopharyngeal Swab  Result Value Ref Range Status   SARS Coronavirus 2 by RT PCR NEGATIVE NEGATIVE Final    Comment: (NOTE) SARS-CoV-2 target nucleic acids are NOT DETECTED.  The SARS-CoV-2 RNA is generally detectable in upper respiratoy specimens during the acute phase of infection. The lowest concentration of SARS-CoV-2 viral copies this assay can detect is 131 copies/mL. A negative result does not preclude SARS-Cov-2 infection and should not be used as the sole basis for treatment or other patient management decisions. A negative result may occur with  improper specimen collection/handling, submission of specimen other than nasopharyngeal swab, presence of viral mutation(s) within the areas targeted by this assay, and inadequate  number of viral copies (<131 copies/mL).  A negative result must be combined with clinical observations, patient history, and epidemiological information. The expected result is Negative.  Fact Sheet for Patients:  PinkCheek.be  Fact Sheet for Healthcare Providers:  GravelBags.it  This test is no t yet approved or cleared by the [place] FDA and  has been authorized for detection and/or diagnosis of SARS-CoV-2 by FDA under an Emergency Use Authorization (EUA). This EUA will remain  in effect (meaning this test can be used) for the duration of the COVID-19 declaration under Section 564(b)(1) of the Act, 21 U.S.C. section 360bbb-3(b)(1), unless the authorization is terminated or revoked sooner.     Influenza A by PCR NEGATIVE NEGATIVE Final   Influenza B by PCR NEGATIVE NEGATIVE Final    Comment: (NOTE) The Xpert Xpress SARS-CoV-2/FLU/RSV assay is intended as an aid in  the diagnosis of influenza from Nasopharyngeal swab specimens and  should not be used as a sole basis for treatment. Nasal washings and  aspirates are unacceptable for Xpert Xpress SARS-CoV-2/FLU/RSV  testing.  Fact Sheet for Patients: PinkCheek.be  Fact Sheet for Healthcare Providers: GravelBags.it  This test is not yet approved or cleared by the [place] FDA and  has been authorized for detection and/or diagnosis of SARS-CoV-2 by  FDA under an Emergency Use Authorization (EUA). This EUA will remain  in effect (meaning this test can be used) for the duration of the  Covid-19 declaration under Section 564(b)(1) of the Act, 21  U.S.C. section 360bbb-3(b)(1), unless the authorization is  terminated or revoked. Performed at [facility], [address] [place], [place] [address]   Gastrointestinal Panel by PCR , Stool     Status: None   Collection Time: 05/24/20  1:00 AM    Specimen: Stool  Result Value Ref Range Status   Campylobacter species NOT DETECTED NOT DETECTED Final   Plesimonas shigelloides NOT DETECTED NOT DETECTED Final   Salmonella species NOT DETECTED NOT DETECTED Final   Yersinia enterocolitica NOT DETECTED NOT DETECTED Final   Vibrio species NOT DETECTED NOT DETECTED Final   Vibrio cholerae NOT DETECTED NOT DETECTED Final   Enteroaggregative E coli (EAEC) NOT DETECTED NOT DETECTED Final   Enteropathogenic E coli (EPEC) NOT DETECTED NOT DETECTED Final   Enterotoxigenic E coli (ETEC) NOT DETECTED NOT DETECTED Final   Shiga like toxin producing E coli (STEC) NOT DETECTED NOT DETECTED Final   Shigella/Enteroinvasive E coli (EIEC) NOT DETECTED NOT DETECTED Final   Cryptosporidium NOT DETECTED NOT DETECTED Final   Cyclospora cayetanensis NOT DETECTED NOT DETECTED Final   Entamoeba histolytica NOT DETECTED NOT DETECTED Final   Giardia lamblia NOT DETECTED NOT DETECTED Final   Adenovirus F40/41 NOT DETECTED NOT DETECTED Final   Astrovirus NOT DETECTED NOT DETECTED Final   Norovirus GI/GII NOT DETECTED NOT DETECTED Final   Rotavirus A NOT DETECTED NOT DETECTED Final   Sapovirus (I, II, IV, and V) NOT DETECTED NOT DETECTED Final    Comment: Performed at [facility], [address] [place], [place] [address]  C Difficile Quick Screen w PCR reflex     Status: None   Collection Time: 05/24/20  1:00 AM   Specimen: Stool  Result Value Ref Range Status   C Diff antigen NEGATIVE NEGATIVE Final   C Diff toxin NEGATIVE NEGATIVE Final   C Diff interpretation No C. difficile detected.  Final    Comment: Performed at [facility], [address], [place], [place] [address]  Urine Culture  Status: None   Collection Time: 05/25/20  4:31 AM   Specimen: Urine, Catheterized  Result Value Ref Range Status   Specimen Description   Final    URINE, CATHETERIZED Performed at [facility], [address], [place], [place]  [address]    Special Requests   Final    NONE Performed at [facility], [address], [place], [place] [address]    Culture   Final    NO GROWTH Performed at [facility], [address], [place], [place] [address]    Report Status 05/26/2020 FINAL  Final  Surgical pcr screen     Status: None   Collection Time: 05/29/20  5:54 AM   Specimen: Nasal Mucosa; Nasal Swab  Result Value Ref Range Status   MRSA, PCR NEGATIVE NEGATIVE Final   Staphylococcus aureus NEGATIVE NEGATIVE Final    Comment: (NOTE) The Xpert SA Assay (FDA approved for NASAL specimens in patients 50 years of age and older), is one component of a comprehensive surveillance program. It is not intended to diagnose infection nor to guide or monitor treatment. Performed at [facility], [address] [place], [place] [address]      Labs: Basic Metabolic Panel: Recent Labs  Lab 05/26/20 [phone] 05/26/20 0450 05/27/20 0547 05/27/20 0547 05/28/20 0451 05/28/20 0451 05/29/20 0612 05/29/20 8144 05/30/20 0540 05/31/20 0543 06/01/20 0358  NA 144   < > 138   < > 139   < > 141 140 139 141 140  K 3.3*   < > 3.6   < > 3.3*   < > 3.5 3.4* 3.9 3.4* 3.4*  CL 108   < > 103   < > 102   < > 105 103 102 103 102  CO2 25   < > 25   < > 28   < > 28 28 26 30 29   GLUCOSE 100*   < > 116*   < > 105*   < > 105* 102* 145* 94 93  BUN 46*   < > 26*   < > 18   < > 16 16 18  23* 24*  CREATININE 4.83*   < > 2.99*   < > 2.19*   < > 2.08* 2.15* 1.87* 1.70* 1.67*  CALCIUM 8.5*   < > 8.4*   < > 8.3*   < > 8.2* 8.1* 8.4* 8.0* 8.1*  MG 1.9  --   --   --   --   --   --   --   --   --   --   PHOS 4.1  --  2.9  --  2.9  --   --  2.2* 3.1  --   --    < > = values in this interval not displayed.   Liver Function Tests: Recent Labs  Lab 05/27/20 0547 05/28/20 0451 05/29/20 0612 05/29/20 8185 05/30/20 0540  AST  --   --  19  --   --   ALT  --   --  7  --   --   ALKPHOS  --   --  43  --   --   BILITOT  --   --  0.5   --   --   PROT  --   --  6.8  --   --   ALBUMIN 2.7* 2.5* 2.6* 2.6* 2.6*   No results for input(s): LIPASE, AMYLASE in the last 168 hours. No  results for input(s): AMMONIA in the last 168 hours. CBC: Recent Labs  Lab 05/27/20 0547 05/28/20 0451 05/29/20 0612 05/30/20 0539 05/31/20 0543  WBC 9.8 8.1 8.9 11.2* 10.1  NEUTROABS  --   --  6.6  --  7.5  HGB 9.3* 8.7* 8.5* 8.7* 8.4*  HCT 28.8* 26.7* 26.9* 26.8* 26.7*  MCV 81.6 82.9 83.3 82.7 84.2  PLT 423* 341 320 348 360   Cardiac Enzymes: No results for input(s): CKTOTAL, CKMB, CKMBINDEX, TROPONINI in the last 168 hours. BNP: BNP (last 3 results) No results for input(s): BNP in the last 8760 hours.  ProBNP (last 3 results) No results for input(s): PROBNP in the last 8760 hours.  CBG: No results for input(s): GLUCAP in the last 168 hours.  Principal Problem:   Metastatic colorectal cancer ([place]) Active Problems:   GI bleed   Acute renal failure (HCC)   Obstructive uropathy   Colitis, acute   Bleeding per rectum   Rectal bleed   Palliative care encounter   Time coordinating discharge: 38 minutes  Signed:        Ione Sandusky, DO [facility]  06/01/2020, 1:06 PM

## 2020-06-01 NOTE — Plan of Care (Signed)
Patient discharged home with daughter

## 2020-06-01 NOTE — Plan of Care (Signed)
Patient discharging home with self care.  B Nephrostomy tubes intact to straight drain. Gauze and supplies given for home.  Discharge instructions and medications reviewed with patient as well as appointments scheduled.  Patient verbalized understanding. No needs at this time.

## 2020-06-01 NOTE — Progress Notes (Signed)
[facility]  ROUNDING NOTE   Subjective:   Patient is in good spirits today.  She had her chest tube removed yesterday.  Reports she is having a good amount of urine output. Left > right.  Denies any shortness of breath,edema, nausea,vomiting. Has had chronic diarrhea.  She is excited about going home today.   Objective:  Vital signs in last 24 hours:  Temp:  [98 F (36.7 C)-98.7 F (37.1 C)] 98.7 F (37.1 C) (10/31 0814) Pulse Rate:  [83-101] 83 (10/31 0814) Resp:  [16-20] 18 (10/31 0814) BP: (104-117)/(61-73) 108/68 (10/31 0814) SpO2:  [96 %-99 %] 96 % (10/31 0814)  Weight change:  Filed Weights   05/22/20 1537 05/27/20 1108 05/28/20 1047  Weight: 59 kg 59 kg 59 kg    Intake/Output: I/O last 3 completed shifts: In: 0  Out: 831 [Urine:825; Chest Tube:6]   Intake/Output this shift:  Total I/O In: 120 [P.O.:120] Out: 250 [Urine:250]  Physical Exam: General: NAD,   Head: Normocephalic, atraumatic. Moist oral mucosal membranes  Eyes: Anicteric, PERRL  Neck: Supple, trachea midline  Lungs:  Clear to auscultation  Heart: Regular rate and rhythm  Abdomen:  Soft, nontender,   Extremities:  No peripheral edema.  Neurologic: Nonfocal, moving all four extremities  Skin: No lesions  Urologic  Bilateral nephrostomy tubes     Basic Metabolic Panel: Recent Labs  Lab 05/26/20 0450 05/26/20 0450 05/27/20 0547 05/27/20 0547 05/28/20 0451 05/28/20 0451 05/29/20 0612 05/29/20 4196 05/29/20 2229 05/29/20 7989 05/30/20 0540 05/31/20 0543 06/01/20 0358  NA 144   < > 138   < > 139   < > 141  --  140  --  139 141 140  K 3.3*   < > 3.6   < > 3.3*   < > 3.5  --  3.4*  --  3.9 3.4* 3.4*  CL 108   < > 103   < > 102   < > 105  --  103  --  102 103 102  CO2 25   < > 25   < > 28   < > 28  --  28  --  26 30 29   GLUCOSE 100*   < > 116*   < > 105*   < > 105*  --  102*  --  145* 94 93  BUN 46*   < > 26*   < > 18   < > 16  --  16  --  18 23* 24*  CREATININE 4.83*    < > 2.99*   < > 2.19*   < > 2.08*  --  2.15*  --  1.87* 1.70* 1.67*  CALCIUM 8.5*   < > 8.4*   < > 8.3*   < > 8.2*   < > 8.1*   < > 8.4* 8.0* 8.1*  MG 1.9  --   --   --   --   --   --   --   --   --   --   --   --   PHOS 4.1  --  2.9  --  2.9  --   --   --  2.2*  --  3.1  --   --    < > = values in this interval not displayed.    Liver Function Tests: Recent Labs  Lab 05/27/20 0547 05/28/20 0451 05/29/20 0612 05/29/20 2119 05/30/20 0540  AST  --   --  19  --   --   ALT  --   --  7  --   --   ALKPHOS  --   --  43  --   --   BILITOT  --   --  0.5  --   --   PROT  --   --  6.8  --   --   ALBUMIN 2.7* 2.5* 2.6* 2.6* 2.6*   No results for input(s): LIPASE, AMYLASE in the last 168 hours. No results for input(s): AMMONIA in the last 168 hours.  CBC: Recent Labs  Lab 05/27/20 0547 05/28/20 0451 05/29/20 0612 05/30/20 0539 05/31/20 0543  WBC 9.8 8.1 8.9 11.2* 10.1  NEUTROABS  --   --  6.6  --  7.5  HGB 9.3* 8.7* 8.5* 8.7* 8.4*  HCT 28.8* 26.7* 26.9* 26.8* 26.7*  MCV 81.6 82.9 83.3 82.7 84.2  PLT 423* 341 320 348 360    Cardiac Enzymes: No results for input(s): CKTOTAL, CKMB, CKMBINDEX, TROPONINI in the last 168 hours.  BNP: Invalid input(s): POCBNP  CBG: No results for input(s): GLUCAP in the last 168 hours.  Microbiology: Results for orders placed or performed during the hospital encounter of 05/22/20  Respiratory Panel by RT PCR (Flu A&B, Covid) - Nasopharyngeal Swab     Status: None   Collection Time: 05/22/20  7:34 PM   Specimen: Nasopharyngeal Swab  Result Value Ref Range Status   SARS Coronavirus 2 by RT PCR NEGATIVE NEGATIVE Final    Comment: (NOTE) SARS-CoV-2 target nucleic acids are NOT DETECTED.  The SARS-CoV-2 RNA is generally detectable in upper respiratoy specimens during the acute phase of infection. The lowest concentration of SARS-CoV-2 viral copies this assay can detect is 131 copies/mL. A negative result does not preclude SARS-Cov-2 infection  and should not be used as the sole basis for treatment or other patient management decisions. A negative result may occur with  improper specimen collection/handling, submission of specimen other than nasopharyngeal swab, presence of viral mutation(s) within the areas targeted by this assay, and inadequate number of viral copies (<131 copies/mL). A negative result must be combined with clinical observations, patient history, and epidemiological information. The expected result is Negative.  Fact Sheet for Patients:  PinkCheek.be  Fact Sheet for Healthcare Providers:  GravelBags.it  This test is no t yet approved or cleared by the [place] FDA and  has been authorized for detection and/or diagnosis of SARS-CoV-2 by FDA under an Emergency Use Authorization (EUA). This EUA will remain  in effect (meaning this test can be used) for the duration of the COVID-19 declaration under Section 564(b)(1) of the Act, 21 U.S.C. section 360bbb-3(b)(1), unless the authorization is terminated or revoked sooner.     Influenza A by PCR NEGATIVE NEGATIVE Final   Influenza B by PCR NEGATIVE NEGATIVE Final    Comment: (NOTE) The Xpert Xpress SARS-CoV-2/FLU/RSV assay is intended as an aid in  the diagnosis of influenza from Nasopharyngeal swab specimens and  should not be used as a sole basis for treatment. Nasal washings and  aspirates are unacceptable for Xpert Xpress SARS-CoV-2/FLU/RSV  testing.  Fact Sheet for Patients: PinkCheek.be  Fact Sheet for Healthcare Providers: GravelBags.it  This test is not yet approved or cleared by the [place] FDA and  has been authorized for detection and/or diagnosis of SARS-CoV-2 by  FDA under an Emergency Use Authorization (EUA). This EUA will remain  in effect (meaning this test can be used) for the  duration of the  Covid-19 declaration  under Section 564(b)(1) of the Act, 21  U.S.C. section 360bbb-3(b)(1), unless the authorization is  terminated or revoked. Performed at [facility], [address] [place], [place] [address]   Gastrointestinal Panel by PCR , Stool     Status: None   Collection Time: 05/24/20  1:00 AM   Specimen: Stool  Result Value Ref Range Status   Campylobacter species NOT DETECTED NOT DETECTED Final   Plesimonas shigelloides NOT DETECTED NOT DETECTED Final   Salmonella species NOT DETECTED NOT DETECTED Final   Yersinia enterocolitica NOT DETECTED NOT DETECTED Final   Vibrio species NOT DETECTED NOT DETECTED Final   Vibrio cholerae NOT DETECTED NOT DETECTED Final   Enteroaggregative E coli (EAEC) NOT DETECTED NOT DETECTED Final   Enteropathogenic E coli (EPEC) NOT DETECTED NOT DETECTED Final   Enterotoxigenic E coli (ETEC) NOT DETECTED NOT DETECTED Final   Shiga like toxin producing E coli (STEC) NOT DETECTED NOT DETECTED Final   Shigella/Enteroinvasive E coli (EIEC) NOT DETECTED NOT DETECTED Final   Cryptosporidium NOT DETECTED NOT DETECTED Final   Cyclospora cayetanensis NOT DETECTED NOT DETECTED Final   Entamoeba histolytica NOT DETECTED NOT DETECTED Final   Giardia lamblia NOT DETECTED NOT DETECTED Final   Adenovirus F40/41 NOT DETECTED NOT DETECTED Final   Astrovirus NOT DETECTED NOT DETECTED Final   Norovirus GI/GII NOT DETECTED NOT DETECTED Final   Rotavirus A NOT DETECTED NOT DETECTED Final   Sapovirus (I, II, IV, and V) NOT DETECTED NOT DETECTED Final    Comment: Performed at [facility], [address] [place], [place] [address]  C Difficile Quick Screen w PCR reflex     Status: None   Collection Time: 05/24/20  1:00 AM   Specimen: Stool  Result Value Ref Range Status   C Diff antigen NEGATIVE NEGATIVE Final   C Diff toxin NEGATIVE NEGATIVE Final   C Diff interpretation No C. difficile detected.  Final    Comment: Performed at [facility], [address] [place], [place] [address]  Urine Culture     Status: None   Collection Time: 05/25/20  4:31 AM   Specimen: Urine, Catheterized  Result Value Ref Range Status   Specimen Description   Final    URINE, CATHETERIZED Performed at [facility], [address], [place], [place] [address]    Special Requests   Final    NONE Performed at [facility], [address], [place], [place] [address]    Culture   Final    NO GROWTH Performed at [facility], [place] [address] [place], [place] [address]    Report Status 05/26/2020 FINAL  Final  Surgical pcr screen     Status: None   Collection Time: 05/29/20  5:54 AM   Specimen: Nasal Mucosa; Nasal Swab  Result Value Ref Range Status   MRSA, PCR NEGATIVE NEGATIVE Final   Staphylococcus aureus NEGATIVE NEGATIVE Final    Comment: (NOTE) The Xpert SA Assay (FDA approved for NASAL specimens in patients 31 years of age and older), is one component of a comprehensive surveillance program. It is not intended to diagnose infection nor to guide or monitor treatment. Performed at [facility], [address] [place], [place] [address]     Coagulation Studies: No results for input(s): LABPROT, INR in the last 72 hours.  Urinalysis: No results for input(s): COLORURINE, LABSPEC, PHURINE, GLUCOSEU, HGBUR, BILIRUBINUR, KETONESUR, PROTEINUR, UROBILINOGEN, NITRITE,  LEUKOCYTESUR in the last 72 hours.  Invalid input(s): APPERANCEUR    Imaging: DG Chest 1 View  Result Date: 05/31/2020 CLINICAL DATA:  Follow-up left pneumothorax. EXAM: CHEST  1 VIEW COMPARISON:  May 31, 2020 FINDINGS: Left-sided injectable port in stable position. Left chest tube in stable position. Cardiomediastinal silhouette is normal. Mediastinal contours appear intact. There is no evidence of focal airspace consolidation, pleural effusion. No radiographically apparent pneumothorax. Osseous structures are without acute abnormality.  Soft tissues are grossly normal. IMPRESSION: 1. No radiographically apparent pneumothorax. 2. Left chest tube in stable position. Electronically Signed   By: Fidela Salisbury M.D.   On: 05/31/2020 13:31   DG Chest 1 View  Result Date: 05/31/2020 CLINICAL DATA:  Follow-up left pneumothorax. EXAM: CHEST  1 VIEW COMPARISON:  05/30/2020 and earlier studies. FINDINGS: Left-sided chest tube is stable, curled tip projecting over the lateral left hilum. No pneumothorax. Mild atelectasis extending laterally from the left hilum and along the medial left lung base, unchanged. Lungs otherwise clear. No pleural effusion. Small amount of subcutaneous air at the left neck base, stable. IMPRESSION: 1. Stable left-sided chest tube with no visible pneumothorax. 2. No acute findings. Mild stable atelectasis on the left as described. Electronically Signed   By: Lajean Manes M.D.   On: 05/31/2020 06:54     Medications:   . sodium chloride     . calcium acetate  667 mg Oral TID WC  . pantoprazole  40 mg Oral BID  . potassium chloride  20 mEq Oral Once  . sodium bicarbonate  650 mg Oral TID   acetaminophen, HYDROcodone-acetaminophen, ondansetron **OR** ondansetron (ZOFRAN) IV, oxyCODONE  Assessment/ Plan:  Anita Sullivan is a 54 y.o.  female female presented to the emergency room with the chief complaint of rectal bleeding for last 3 months.  Patient was consulted for nephrology for acute kidney injury. She has bilateral nephrostomy tubes, placed during this admission.  1. AKI secondary to obstruction  - urine output today at 130 mL, yesterday output 506   - Renal function is improving, Creatinine 1.67. creatinine peaked at 10.74.  - unclear of baseline creatinine. Creatinine 0.62 in 2018 from [facility], I am unable to find any more recent labs.  - nephrostomy tubes are draining well. Urine appears to be light yellow, not cloudy, no evidence of hematuria.   2. Normocytic Anemia   - Hgb 8.4.     LOS: [address] 10/31/202111:01 AM

## 2020-06-02 ENCOUNTER — Other Ambulatory Visit: Payer: Self-pay | Admitting: Oncology

## 2020-06-02 DIAGNOSIS — C19 Malignant neoplasm of rectosigmoid junction: Secondary | ICD-10-CM

## 2020-06-02 MED ORDER — PROCHLORPERAZINE MALEATE 10 MG PO TABS: 10.0000 mg | ORAL_TABLET | Freq: Four times a day (QID) | ORAL | 2 refills | Status: DC | PRN

## 2020-06-02 MED ORDER — ONDANSETRON HCL 8 MG PO TABS: 8.0000 mg | ORAL_TABLET | Freq: Two times a day (BID) | ORAL | 2 refills | Status: DC | PRN

## 2020-06-02 MED ORDER — LIDOCAINE-PRILOCAINE 2.5-2.5 % EX CREA: TOPICAL_CREAM | CUTANEOUS | 3 refills | Status: DC

## 2020-06-02 NOTE — Progress Notes (Signed)
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

## 2020-06-02 NOTE — Patient Instructions (Signed)
Oxaliplatin Injection What is this medicine? OXALIPLATIN (ox AL i PLA tin) is a chemotherapy drug. It targets fast dividing cells, like cancer cells, and causes these cells to die. This medicine is used to treat cancers of the colon and rectum, and many other cancers. This medicine may be used for other purposes; ask your health care provider or pharmacist if you have questions. COMMON BRAND NAME(S): Eloxatin What should I tell my health care provider before I take this medicine? They need to know if you have any of these conditions:  heart disease  history of irregular heartbeat  liver disease  low blood counts, like white cells, platelets, or red blood cells  lung or breathing disease, like asthma  take medicines that treat or prevent blood clots  tingling of the fingers or toes, or other nerve disorder  an unusual or allergic reaction to oxaliplatin, other chemotherapy, other medicines, foods, dyes, or preservatives  pregnant or trying to get pregnant  breast-feeding How should I use this medicine? This drug is given as an infusion into a vein. It is administered in a hospital or clinic by a specially trained health care professional. Talk to your pediatrician regarding the use of this medicine in children. Special care may be needed. Overdosage: If you think you have taken too much of this medicine contact a poison control center or emergency room at once. NOTE: This medicine is only for you. Do not share this medicine with others. What if I miss a dose? It is important not to miss a dose. Call your doctor or health care professional if you are unable to keep an appointment. What may interact with this medicine? Do not take this medicine with any of the following medications:  cisapride  dronedarone  pimozide  thioridazine This medicine may also interact with the following medications:  aspirin and aspirin-like medicines  certain medicines that treat or prevent  blood clots like warfarin, apixaban, dabigatran, and rivaroxaban  cisplatin  cyclosporine  diuretics  medicines for infection like acyclovir, adefovir, amphotericin B, bacitracin, cidofovir, foscarnet, ganciclovir, gentamicin, pentamidine, vancomycin  NSAIDs, medicines for pain and inflammation, like ibuprofen or naproxen  other medicines that prolong the QT interval (an abnormal heart rhythm)  pamidronate  zoledronic acid This list may not describe all possible interactions. Give your health care provider a list of all the medicines, herbs, non-prescription drugs, or dietary supplements you use. Also tell them if you smoke, drink alcohol, or use illegal drugs. Some items may interact with your medicine. What should I watch for while using this medicine? Your condition will be monitored carefully while you are receiving this medicine. You may need blood work done while you are taking this medicine. This medicine may make you feel generally unwell. This is not uncommon as chemotherapy can affect healthy cells as well as cancer cells. Report any side effects. Continue your course of treatment even though you feel ill unless your healthcare professional tells you to stop. This medicine can make you more sensitive to cold. Do not drink cold drinks or use ice. Cover exposed skin before coming in contact with cold temperatures or cold objects. When out in cold weather wear warm clothing and cover your mouth and nose to warm the air that goes into your lungs. Tell your doctor if you get sensitive to the cold. Do not become pregnant while taking this medicine or for 9 months after stopping it. Women should inform their health care professional if they wish to become   pregnant or think they might be pregnant. Men should not father a child while taking this medicine and for 6 months after stopping it. There is potential for serious side effects to an unborn child. Talk to your health care professional  for more information. Do not breast-feed a child while taking this medicine or for 3 months after stopping it. This medicine has caused ovarian failure in some women. This medicine may make it more difficult to get pregnant. Talk to your health care professional if you are concerned about your fertility. This medicine has caused decreased sperm counts in some men. This may make it more difficult to father a child. Talk to your health care professional if you are concerned about your fertility. This medicine may increase your risk of getting an infection. Call your health care professional for advice if you get a fever, chills, or sore throat, or other symptoms of a cold or flu. Do not treat yourself. Try to avoid being around people who are sick. Avoid taking medicines that contain aspirin, acetaminophen, ibuprofen, naproxen, or ketoprofen unless instructed by your health care professional. These medicines may hide a fever. Be careful brushing or flossing your teeth or using a toothpick because you may get an infection or bleed more easily. If you have any dental work done, tell your dentist you are receiving this medicine. What side effects may I notice from receiving this medicine? Side effects that you should report to your doctor or health care professional as soon as possible:  allergic reactions like skin rash, itching or hives, swelling of the face, lips, or tongue  breathing problems  cough  low blood counts - this medicine may decrease the number of white blood cells, red blood cells, and platelets. You may be at increased risk for infections and bleeding  nausea, vomiting  pain, redness, or irritation at site where injected  pain, tingling, numbness in the hands or feet  signs and symptoms of bleeding such as bloody or black, tarry stools; red or dark brown urine; spitting up blood or brown material that looks like coffee grounds; red spots on the skin; unusual bruising or bleeding  from the eyes, gums, or nose  signs and symptoms of a dangerous change in heartbeat or heart rhythm like chest pain; dizziness; fast, irregular heartbeat; palpitations; feeling faint or lightheaded; falls  signs and symptoms of infection like fever; chills; cough; sore throat; pain or trouble passing urine  signs and symptoms of liver injury like dark yellow or brown urine; general ill feeling or flu-like symptoms; light-colored stools; loss of appetite; nausea; right upper belly pain; unusually weak or tired; yellowing of the eyes or skin  signs and symptoms of low red blood cells or anemia such as unusually weak or tired; feeling faint or lightheaded; falls  signs and symptoms of muscle injury like dark urine; trouble passing urine or change in the amount of urine; unusually weak or tired; muscle pain; back pain Side effects that usually do not require medical attention (report to your doctor or health care professional if they continue or are bothersome):  changes in taste  diarrhea  gas  hair loss  loss of appetite  mouth sores This list may not describe all possible side effects. Call your doctor for medical advice about side effects. You may report side effects to FDA at 1-800-FDA-1088. Where should I keep my medicine? This drug is given in a hospital or clinic and will not be stored at home. NOTE:   This sheet is a summary. It may not cover all possible information. If you have questions about this medicine, talk to your doctor, pharmacist, or health care provider.  2020 [employer] Standard (2018-12-06 12:20:35) Fluorouracil, 5-FU injection What is this medicine? FLUOROURACIL, 5-FU (flure oh YOOR a sil) is a chemotherapy drug. It slows the growth of cancer cells. This medicine is used to treat many types of cancer like breast cancer, colon or rectal cancer, pancreatic cancer, and stomach cancer. This medicine may be used for other purposes; ask your health care provider or  pharmacist if you have questions. COMMON BRAND NAME(S): Adrucil What should I tell my health care provider before I take this medicine? They need to know if you have any of these conditions:  blood disorders  dihydropyrimidine dehydrogenase (DPD) deficiency  infection (especially a virus infection such as chickenpox, cold sores, or herpes)  kidney disease  liver disease  malnourished, poor nutrition  recent or ongoing radiation therapy  an unusual or allergic reaction to fluorouracil, other chemotherapy, other medicines, foods, dyes, or preservatives  pregnant or trying to get pregnant  breast-feeding How should I use this medicine? This drug is given as an infusion or injection into a vein. It is administered in a hospital or clinic by a specially trained health care professional. Talk to your pediatrician regarding the use of this medicine in children. Special care may be needed. Overdosage: If you think you have taken too much of this medicine contact a poison control center or emergency room at once. NOTE: This medicine is only for you. Do not share this medicine with others. What if I miss a dose? It is important not to miss your dose. Call your doctor or health care professional if you are unable to keep an appointment. What may interact with this medicine?  allopurinol  cimetidine  dapsone  digoxin  hydroxyurea  leucovorin  levamisole  medicines for seizures like ethotoin, fosphenytoin, phenytoin  medicines to increase blood counts like filgrastim, pegfilgrastim, sargramostim  medicines that treat or prevent blood clots like warfarin, enoxaparin, and dalteparin  methotrexate  metronidazole  pyrimethamine  some other chemotherapy drugs like busulfan, cisplatin, estramustine, vinblastine  trimethoprim  trimetrexate  vaccines Talk to your doctor or health care professional before taking any of these  medicines:  acetaminophen  aspirin  ibuprofen  ketoprofen  naproxen This list may not describe all possible interactions. Give your health care provider a list of all the medicines, herbs, non-prescription drugs, or dietary supplements you use. Also tell them if you smoke, drink alcohol, or use illegal drugs. Some items may interact with your medicine. What should I watch for while using this medicine? Visit your doctor for checks on your progress. This drug may make you feel generally unwell. This is not uncommon, as chemotherapy can affect healthy cells as well as cancer cells. Report any side effects. Continue your course of treatment even though you feel ill unless your doctor tells you to stop. In some cases, you may be given additional medicines to help with side effects. Follow all directions for their use. Call your doctor or health care professional for advice if you get a fever, chills or sore throat, or other symptoms of a cold or flu. Do not treat yourself. This drug decreases your body's ability to fight infections. Try to avoid being around people who are sick. This medicine may increase your risk to bruise or bleed. Call your doctor or health care professional if you notice any   unusual bleeding. Be careful brushing and flossing your teeth or using a toothpick because you may get an infection or bleed more easily. If you have any dental work done, tell your dentist you are receiving this medicine. Avoid taking products that contain aspirin, acetaminophen, ibuprofen, naproxen, or ketoprofen unless instructed by your doctor. These medicines may hide a fever. Do not become pregnant while taking this medicine. Women should inform their doctor if they wish to become pregnant or think they might be pregnant. There is a potential for serious side effects to an unborn child. Talk to your health care professional or pharmacist for more information. Do not breast-feed an infant while taking  this medicine. Men should inform their doctor if they wish to father a child. This medicine may lower sperm counts. Do not treat diarrhea with over the counter products. Contact your doctor if you have diarrhea that lasts more than 2 days or if it is severe and watery. This medicine can make you more sensitive to the sun. Keep out of the sun. If you cannot avoid being in the sun, wear protective clothing and use sunscreen. Do not use sun lamps or tanning beds/booths. What side effects may I notice from receiving this medicine? Side effects that you should report to your doctor or health care professional as soon as possible:  allergic reactions like skin rash, itching or hives, swelling of the face, lips, or tongue  low blood counts - this medicine may decrease the number of white blood cells, red blood cells and platelets. You may be at increased risk for infections and bleeding.  signs of infection - fever or chills, cough, sore throat, pain or difficulty passing urine  signs of decreased platelets or bleeding - bruising, pinpoint red spots on the skin, black, tarry stools, blood in the urine  signs of decreased red blood cells - unusually weak or tired, fainting spells, lightheadedness  breathing problems  changes in vision  chest pain  mouth sores  nausea and vomiting  pain, swelling, redness at site where injected  pain, tingling, numbness in the hands or feet  redness, swelling, or sores on hands or feet  stomach pain  unusual bleeding Side effects that usually do not require medical attention (report to your doctor or health care professional if they continue or are bothersome):  changes in finger or toe nails  diarrhea  dry or itchy skin  hair loss  headache  loss of appetite  sensitivity of eyes to the light  stomach upset  unusually teary eyes This list may not describe all possible side effects. Call your doctor for medical advice about side effects.  You may report side effects to FDA at 1-800-FDA-1088. Where should I keep my medicine? This drug is given in a hospital or clinic and will not be stored at home. NOTE: This sheet is a summary. It may not cover all possible information. If you have questions about this medicine, talk to your doctor, pharmacist, or health care provider.  2020 [employer]/Gold Standard (2007-11-22 13:53:16) Leucovorin injection What is this medicine? LEUCOVORIN (loo koe VOR in) is used to prevent or treat the harmful effects of some medicines. This medicine is used to treat anemia caused by a low amount of folic acid in the body. It is also used with 5-fluorouracil (5-FU) to treat colon cancer. This medicine may be used for other purposes; ask your health care provider or pharmacist if you have questions. What should I tell my health care   provider before I take this medicine? They need to know if you have any of these conditions:  anemia from low levels of vitamin B-12 in the blood  an unusual or allergic reaction to leucovorin, folic acid, other medicines, foods, dyes, or preservatives  pregnant or trying to get pregnant  breast-feeding How should I use this medicine? This medicine is for injection into a muscle or into a vein. It is given by a health care professional in a hospital or clinic setting. Talk to your pediatrician regarding the use of this medicine in children. Special care may be needed. Overdosage: If you think you have taken too much of this medicine contact a poison control center or emergency room at once. NOTE: This medicine is only for you. Do not share this medicine with others. What if I miss a dose? This does not apply. What may interact with this medicine?  capecitabine  fluorouracil  phenobarbital  phenytoin  primidone  trimethoprim-sulfamethoxazole This list may not describe all possible interactions. Give your health care provider a list of all the medicines, herbs,  non-prescription drugs, or dietary supplements you use. Also tell them if you smoke, drink alcohol, or use illegal drugs. Some items may interact with your medicine. What should I watch for while using this medicine? Your condition will be monitored carefully while you are receiving this medicine. This medicine may increase the side effects of 5-fluorouracil, 5-FU. Tell your doctor or health care professional if you have diarrhea or mouth sores that do not get better or that get worse. What side effects may I notice from receiving this medicine? Side effects that you should report to your doctor or health care professional as soon as possible:  allergic reactions like skin rash, itching or hives, swelling of the face, lips, or tongue  breathing problems  fever, infection  mouth sores  unusual bleeding or bruising  unusually weak or tired Side effects that usually do not require medical attention (report to your doctor or health care professional if they continue or are bothersome):  constipation or diarrhea  loss of appetite  nausea, vomiting This list may not describe all possible side effects. Call your doctor for medical advice about side effects. You may report side effects to FDA at 1-800-FDA-1088. Where should I keep my medicine? This drug is given in a hospital or clinic and will not be stored at home. NOTE: This sheet is a summary. It may not cover all possible information. If you have questions about this medicine, talk to your doctor, pharmacist, or health care provider.  2020 [employer] Standard (2008-01-23 16:50:29)  

## 2020-06-03 ENCOUNTER — Other Ambulatory Visit: Payer: Self-pay

## 2020-06-03 ENCOUNTER — Inpatient Hospital Stay (HOSPITAL_BASED_OUTPATIENT_CLINIC_OR_DEPARTMENT_OTHER): Payer: Medicaid Other | Admitting: Oncology

## 2020-06-03 ENCOUNTER — Other Ambulatory Visit: Payer: Self-pay | Admitting: Oncology

## 2020-06-03 ENCOUNTER — Inpatient Hospital Stay: Payer: Medicaid Other

## 2020-06-03 ENCOUNTER — Encounter: Payer: Self-pay | Admitting: Oncology

## 2020-06-03 ENCOUNTER — Inpatient Hospital Stay: Payer: Medicaid Other | Attending: Oncology

## 2020-06-03 VITALS — BP 115/74 | HR 93 | Temp 97.7°F | Resp 20 | Wt 134.4 lb

## 2020-06-03 DIAGNOSIS — Z833 Family history of diabetes mellitus: Secondary | ICD-10-CM | POA: Insufficient documentation

## 2020-06-03 DIAGNOSIS — K769 Liver disease, unspecified: Secondary | ICD-10-CM | POA: Diagnosis not present

## 2020-06-03 DIAGNOSIS — R5383 Other fatigue: Secondary | ICD-10-CM | POA: Insufficient documentation

## 2020-06-03 DIAGNOSIS — Z8049 Family history of malignant neoplasm of other genital organs: Secondary | ICD-10-CM | POA: Insufficient documentation

## 2020-06-03 DIAGNOSIS — R591 Generalized enlarged lymph nodes: Secondary | ICD-10-CM | POA: Insufficient documentation

## 2020-06-03 DIAGNOSIS — C19 Malignant neoplasm of rectosigmoid junction: Secondary | ICD-10-CM

## 2020-06-03 DIAGNOSIS — Z7189 Other specified counseling: Secondary | ICD-10-CM

## 2020-06-03 DIAGNOSIS — R531 Weakness: Secondary | ICD-10-CM | POA: Diagnosis not present

## 2020-06-03 DIAGNOSIS — C787 Secondary malignant neoplasm of liver and intrahepatic bile duct: Secondary | ICD-10-CM | POA: Diagnosis not present

## 2020-06-03 DIAGNOSIS — Z823 Family history of stroke: Secondary | ICD-10-CM | POA: Diagnosis not present

## 2020-06-03 DIAGNOSIS — N1330 Unspecified hydronephrosis: Secondary | ICD-10-CM | POA: Diagnosis not present

## 2020-06-03 DIAGNOSIS — R160 Hepatomegaly, not elsewhere classified: Secondary | ICD-10-CM | POA: Diagnosis not present

## 2020-06-03 DIAGNOSIS — R110 Nausea: Secondary | ICD-10-CM | POA: Insufficient documentation

## 2020-06-03 DIAGNOSIS — Z79899 Other long term (current) drug therapy: Secondary | ICD-10-CM | POA: Insufficient documentation

## 2020-06-03 DIAGNOSIS — Z8249 Family history of ischemic heart disease and other diseases of the circulatory system: Secondary | ICD-10-CM | POA: Insufficient documentation

## 2020-06-03 DIAGNOSIS — E876 Hypokalemia: Secondary | ICD-10-CM | POA: Insufficient documentation

## 2020-06-03 DIAGNOSIS — D649 Anemia, unspecified: Secondary | ICD-10-CM | POA: Diagnosis not present

## 2020-06-03 DIAGNOSIS — Z809 Family history of malignant neoplasm, unspecified: Secondary | ICD-10-CM | POA: Insufficient documentation

## 2020-06-03 DIAGNOSIS — N179 Acute kidney failure, unspecified: Secondary | ICD-10-CM | POA: Insufficient documentation

## 2020-06-03 DIAGNOSIS — D72819 Decreased white blood cell count, unspecified: Secondary | ICD-10-CM | POA: Insufficient documentation

## 2020-06-03 DIAGNOSIS — Z800 Family history of malignant neoplasm of digestive organs: Secondary | ICD-10-CM | POA: Diagnosis not present

## 2020-06-03 DIAGNOSIS — K921 Melena: Secondary | ICD-10-CM | POA: Insufficient documentation

## 2020-06-03 DIAGNOSIS — R918 Other nonspecific abnormal finding of lung field: Secondary | ICD-10-CM | POA: Insufficient documentation

## 2020-06-03 LAB — CBC WITH DIFFERENTIAL/PLATELET
Abs Immature Granulocytes: 0.04 10*3/uL (ref 0.00–0.07)
Basophils Absolute: 0.1 10*3/uL (ref 0.0–0.1)
Basophils Relative: 1 %
Eosinophils Absolute: 0.2 10*3/uL (ref 0.0–0.5)
Eosinophils Relative: 2 %
HCT: 31.7 % — ABNORMAL LOW (ref 36.0–46.0)
Hemoglobin: 9.9 g/dL — ABNORMAL LOW (ref 12.0–15.0)
Immature Granulocytes: 0 %
Lymphocytes Relative: 18 %
Lymphs Abs: 1.6 10*3/uL (ref 0.7–4.0)
MCH: 26.4 pg (ref 26.0–34.0)
MCHC: 31.2 g/dL (ref 30.0–36.0)
MCV: 84.5 fL (ref 80.0–100.0)
Monocytes Absolute: 0.6 10*3/uL (ref 0.1–1.0)
Monocytes Relative: 7 %
Neutro Abs: 6.7 10*3/uL (ref 1.7–7.7)
Neutrophils Relative %: 72 %
Platelets: 420 10*3/uL — ABNORMAL HIGH (ref 150–400)
RBC: 3.75 MIL/uL — ABNORMAL LOW (ref 3.87–5.11)
RDW: 17.7 % — ABNORMAL HIGH (ref 11.5–15.5)
WBC: 9.2 10*3/uL (ref 4.0–10.5)
nRBC: 0 % (ref 0.0–0.2)

## 2020-06-03 LAB — COMPREHENSIVE METABOLIC PANEL
ALT: 9 U/L (ref 0–44)
AST: 23 U/L (ref 15–41)
Albumin: 3.3 g/dL — ABNORMAL LOW (ref 3.5–5.0)
Alkaline Phosphatase: 59 U/L (ref 38–126)
Anion gap: 10 (ref 5–15)
BUN: 20 mg/dL (ref 6–20)
CO2: 29 mmol/L (ref 22–32)
Calcium: 8.7 mg/dL — ABNORMAL LOW (ref 8.9–10.3)
Chloride: 100 mmol/L (ref 98–111)
Creatinine, Ser: 1.48 mg/dL — ABNORMAL HIGH (ref 0.44–1.00)
GFR, Estimated: 42 mL/min — ABNORMAL LOW (ref >60)
Glucose, Bld: 119 mg/dL — ABNORMAL HIGH (ref 70–99)
Potassium: 3.6 mmol/L (ref 3.5–5.1)
Sodium: 139 mmol/L (ref 135–145)
Total Bilirubin: 0.4 mg/dL (ref 0.3–1.2)
Total Protein: 8 g/dL (ref 6.5–8.1)

## 2020-06-03 LAB — URINALYSIS, DIPSTICK ONLY
Bilirubin Urine: NEGATIVE
Glucose, UA: NEGATIVE mg/dL
Ketones, ur: NEGATIVE mg/dL
Leukocytes,Ua: NEGATIVE
Nitrite: NEGATIVE
Protein, ur: 100 mg/dL — AB
Specific Gravity, Urine: 1.014 (ref 1.005–1.030)
pH: 6 (ref 5.0–8.0)

## 2020-06-03 MED ORDER — LIDOCAINE-PRILOCAINE 2.5-2.5 % EX CREA: TOPICAL_CREAM | CUTANEOUS | 3 refills | Status: DC

## 2020-06-03 NOTE — Progress Notes (Signed)
Patient and daughter here today for hospital follow up, States they have concerns about needles are concern when patient is sitting. One is left uncovered and was wondering if it is suppose to be like that. Patient feels very tired.

## 2020-06-03 NOTE — Progress Notes (Signed)
[place]  [phone] [phone]  Patient Care Team: Volney American, PA-C as PCP - General (Family Medicine) [name], MD as Consulting Physician (Oncology)   Name of the patient: Anita Sullivan  [id number]  [birth year]   Date of visit: 06/03/20  Diagnosis-stage IV colon cancer  Chief complaint/Reason for visit- Initial Meeting for [facility], preparing for starting chemotherapy  Heme/Onc history:  Oncology History  Metastatic colorectal cancer ([place])  05/28/2020 Initial Diagnosis   Metastatic colorectal cancer ([place])   05/31/2020 Cancer Staging   Staging form: Colon and Rectum, AJCC 8th Edition - Clinical stage from 05/31/2020: Stage IVC (cTX, cNX, pM1c) - Signed by [name], MD on 05/31/2020   06/04/2020 -  Chemotherapy   The patient had palonosetron (ALOXI) injection 0.25 mg, 0.25 mg, Intravenous,  Once, 0 of 12 cycles leucovorin 632 mg in dextrose 5 % 250 mL infusion, 400 mg/m2 = 632 mg, Intravenous,  Once, 0 of 12 cycles oxaliplatin (ELOXATIN) 135 mg in dextrose 5 % 500 mL chemo infusion, 85 mg/m2 = 135 mg, Intravenous,  Once, 0 of 12 cycles fluorouracil (ADRUCIL) chemo injection 650 mg, 400 mg/m2 = 650 mg, Intravenous,  Once, 0 of 12 cycles fluorouracil (ADRUCIL) 3,800 mg in sodium chloride 0.9 % 74 mL chemo infusion, 2,400 mg/m2 = 3,800 mg, Intravenous, 1 Day/Dose, 0 of 12 cycles bevacizumab-bvzr (ZIRABEV) 300 mg in sodium chloride 0.9 % 100 mL chemo infusion, 5 mg/kg = 300 mg, Intravenous,  Once, 0 of 11 cycles  for chemotherapy treatment.      Interval history-  Anita Sullivan is a 54 yo female who presents to chemo care clinic today for initial meeting in preparation for starting chemotherapy. I introduced the chemo care clinic and we discussed that the role of the clinic is to assist those who are at an increased risk of emergency room visits and/or complications  during the course of chemotherapy treatment. We discussed that the increased risk takes into account factors such as age, performance status, and co-morbidities. We also discussed that for some, this might include barriers to care such as not having a primary care provider, lack of insurance/transportation, or not being able to afford medications. We discussed that the goal of the program is to help prevent unplanned ER visits and help reduce complications during chemotherapy. We do this by discussing specific risk factors to each individual and identifying ways that we can help improve these risk factors and reduce barriers to care.   ECOG FS:1 - Symptomatic but completely ambulatory  Review of systems- Review of Systems  Constitutional: Negative.  Negative for chills, fever, malaise/fatigue and weight loss.  HENT: Negative for congestion, ear pain and tinnitus.   Eyes: Negative.  Negative for blurred vision and double vision.  Respiratory: Negative.  Negative for cough, sputum production and shortness of breath.   Cardiovascular: Negative.  Negative for chest pain, palpitations and leg swelling.  Gastrointestinal: Positive for blood in stool. Negative for abdominal pain, constipation, diarrhea, nausea and vomiting.  Genitourinary: Negative for dysuria, frequency and urgency.  Musculoskeletal: Negative for back pain and falls.  Skin: Negative.  Negative for rash.  Neurological: Negative.  Negative for weakness and headaches.  Endo/Heme/Allergies: Negative.  Does not bruise/bleed easily.  Psychiatric/Behavioral: Negative.  Negative for depression. The patient is not nervous/anxious and does not have insomnia.      Current treatment-FOLFOX  No Known Allergies  No past medical history  on file.  Past Surgical History:  Procedure Laterality Date  . COLONOSCOPY WITH PROPOFOL N/A 05/27/2020   Procedure: COLONOSCOPY WITH PROPOFOL;  Surgeon: [name], MD;  Location: [facility] ENDOSCOPY;   Service: Gastroenterology;  Laterality: N/A;  . FLEXIBLE SIGMOIDOSCOPY N/A 05/28/2020   Procedure: FLEXIBLE SIGMOIDOSCOPY;  Surgeon: [name], MD;  Location: [facility] ENDOSCOPY;  Service: Gastroenterology;  Laterality: N/A;  . IR NEPHROSTOMY PLACEMENT LEFT  05/23/2020  . IR NEPHROSTOMY PLACEMENT RIGHT  05/23/2020  . PORTACATH PLACEMENT Left 05/29/2020   Procedure: INSERTION PORT-A-CATH;  Surgeon: [name], MD;  Location: [facility] ORS;  Service: General;  Laterality: Left;    Social History   Socioeconomic History  . Marital status: Divorced    Spouse name: Not on file  . Number of children: Not on file  . Years of education: Not on file  . Highest education level: Not on file  Occupational History  . Not on file  Tobacco Use  . Smoking status: Never Smoker  . Smokeless tobacco: Never Used  Vaping Use  . Vaping Use: Never used  Substance and Sexual Activity  . Alcohol use: Never  . Drug use: Never  . Sexual activity: Not Currently  Other Topics Concern  . Not on file  Social History Narrative  . Not on file   Social Determinants of Health   Financial Resource Strain:   . Difficulty of Paying Living Expenses: Not on file  Food Insecurity:   . Worried About Charity fundraiser in the Last Year: Not on file  . Ran Out of Food in the Last Year: Not on file  Transportation Needs:   . Lack of Transportation (Medical): Not on file  . Lack of Transportation (Non-Medical): Not on file  Physical Activity:   . Days of Exercise per Week: Not on file  . Minutes of Exercise per Session: Not on file  Stress:   . Feeling of Stress : Not on file  Social Connections:   . Frequency of Communication with Friends and Family: Not on file  . Frequency of Social Gatherings with Friends and Family: Not on file  . Attends Religious Services: Not on file  . Active Member of Clubs or Organizations: Not on file  . Attends Archivist Meetings: Not on file  . Marital Status:  Not on file  Intimate Partner Violence:   . Fear of Current or Ex-Partner: Not on file  . Emotionally Abused: Not on file  . Physically Abused: Not on file  . Sexually Abused: Not on file    Family History  Problem Relation Age of Onset  . Other Mother        Corticobasal degeneration  . Cancer Mother        Uterine  . Diabetes Maternal Grandmother   . Stroke Maternal Grandfather   . Stroke Paternal Grandfather   . Heart disease Father      Current Outpatient Medications:  .  acetaminophen (TYLENOL) 500 MG tablet, Take 1,000 mg by mouth every 6 (six) hours as needed. (Patient not taking: Reported on 05/22/2020), Disp: , Rfl:  .  lidocaine-prilocaine (EMLA) cream, Apply to affected area once, Disp: 30 g, Rfl: 3 .  ondansetron (ZOFRAN) 8 MG tablet, Take 1 tablet (8 mg total) by mouth 2 (two) times daily as needed for refractory nausea / vomiting., Disp: 60 tablet, Rfl: 2 .  oxyCODONE (OXY IR/ROXICODONE) 5 MG immediate release tablet, Take 1 tablet (5 mg total) by mouth  every 4 (four) hours as needed for moderate pain or severe pain., Disp: 14 tablet, Rfl: 0 .  pantoprazole (PROTONIX) 40 MG tablet, Take 1 tablet (40 mg total) by mouth 2 (two) times daily., Disp: 60 tablet, Rfl: 0 .  potassium chloride SA (KLOR-CON) 20 MEQ tablet, Take 1 tablet (20 mEq total) by mouth once for 1 dose., Disp: 1 tablet, Rfl: 0 .  prochlorperazine (COMPAZINE) 10 MG tablet, Take 1 tablet (10 mg total) by mouth every 6 (six) hours as needed (Nausea or vomiting)., Disp: 60 tablet, Rfl: 2  Physical exam: There were no vitals filed for this visit. Physical Exam Constitutional:      Appearance: Normal appearance.  HENT:     Head: Normocephalic and atraumatic.  Eyes:     Pupils: Pupils are equal, round, and reactive to light.  Cardiovascular:     Rate and Rhythm: Normal rate and regular rhythm.     Heart sounds: Normal heart sounds. No murmur heard.   Pulmonary:     Effort: Pulmonary effort is normal.      Breath sounds: Normal breath sounds. No wheezing.  Abdominal:     General: Bowel sounds are normal. There is no distension.     Palpations: Abdomen is soft.     Tenderness: There is no abdominal tenderness.  Musculoskeletal:        General: Normal range of motion.     Cervical back: Normal range of motion.  Skin:    General: Skin is warm and dry.     Findings: No rash.  Neurological:     Mental Status: She is alert and oriented to person, place, and time.  Psychiatric:        Judgment: Judgment normal.      CMP Latest Ref Rng & Units 06/03/2020  Glucose 70 - 99 mg/dL 119(H)  BUN 6 - 20 mg/dL 20  Creatinine 0.44 - 1.00 mg/dL 1.48(H)  Sodium 135 - 145 mmol/L 139  Potassium 3.5 - 5.1 mmol/L 3.6  Chloride 98 - 111 mmol/L 100  CO2 22 - 32 mmol/L 29  Calcium 8.9 - 10.3 mg/dL 8.7(L)  Total Protein 6.5 - 8.1 g/dL 8.0  Total Bilirubin 0.3 - 1.2 mg/dL 0.4  Alkaline Phos 38 - 126 U/L 59  AST 15 - 41 U/L 23  ALT 0 - 44 U/L 9   CBC Latest Ref Rng & Units 06/03/2020  WBC 4.0 - 10.5 K/uL 9.2  Hemoglobin 12.0 - 15.0 g/dL 9.9(L)  Hematocrit 36 - 46 % 31.7(L)  Platelets 150 - 400 K/uL 420(H)    No images are attached to the encounter.  CT ABDOMEN PELVIS WO CONTRAST  Result Date: 05/22/2020 CLINICAL DATA:  Intermittent rectal bleeding. EXAM: CT ABDOMEN AND PELVIS WITHOUT CONTRAST TECHNIQUE: Multidetector CT imaging of the abdomen and pelvis was performed following the standard protocol without IV contrast. COMPARISON:  None. FINDINGS: Lower chest: No acute abnormality. Hepatobiliary: A 2.9 cm x 3.1 cm ill-defined area of heterogeneous low attenuation is seen within the lateral aspect of the liver dome (axial CT image 17, CT series number 2). An ill-defined, slightly exophytic 6.9 cm x 5.0 cm area of heterogeneous mildly decreased attenuation is seen within the posterior aspect of the right lobe of the liver. An ill-defined central area of low attenuation is noted within this region. No  gallstones, gallbladder wall thickening, or biliary dilatation. Pancreas: Unremarkable. No pancreatic ductal dilatation or surrounding inflammatory changes. Spleen: Normal in size without focal abnormality. Adrenals/Urinary Tract:  Adrenal glands are unremarkable. Kidneys are normal in size, without focal lesions. Marked severity bilateral hydronephrosis and hydroureter is seen. The urinary bladder is partially contracted and subsequently limited in evaluation. Stomach/Bowel: Stomach is within normal limits. Appendix appears normal. No evidence of bowel dilatation. There is marked severity thickening of the mid and distal sigmoid colon associated pericolonic inflammatory fat stranding is noted. Vascular/Lymphatic: No significant vascular findings are present. There is moderate severity para-aortic and aortocaval lymphadenopathy. The largest lymph node measures approximately 1.7 cm x 1.7 cm along the para-aortic region at the level of the kidney. It should be noted that this area is limited in evaluation in the absence of intravenous contrast. A 2.8 cm x 2.4 cm heterogeneous soft tissue mass is seen along the posterior aspect of the distal left iliac vessels (axial CT images 68 through 74, CT series number 2). Numerous subcentimeter soft tissue nodules are seen within the mesentery of the pelvis, along the midline (axial CT images 58 through 66, CT series number 2). Numerous additional small soft tissue nodules are seen surrounding the distal sigmoid colon and rectosigmoid junction. Reproductive: The uterus is mildly enlarged. The uterine fundus is positioned to the left of midline within the anterior aspect of the pelvis. Other: No abdominal wall hernia or abnormality. A small amount of abdominal and pelvic fluid is noted. Musculoskeletal: No acute or significant osseous findings. IMPRESSION: 1. Marked severity sigmoid colitis. Due to the numerous areas of soft tissue nodularity within the mesentery in this region,  an underlying neoplastic process cannot be excluded. Correlation with colonoscopy is recommended. 2. Heterogeneous liver masses, as described above, suspicious for an underlying neoplasm. MRI correlation is recommended. 3. Moderate severity para-aortic and aortocaval lymphadenopathy with an additional enlarged, irregular appearing lymph node seen along the posterior aspect of the distal left iliac vessels. This is also worrisome for sequelae associated with an underlying neoplasm. 4. Marked severity bilateral hydronephrosis and hydroureter which likely represents partial renal obstruction secondary to the inflammatory and suspected neoplastic process seen within the pelvis. Electronically Signed   By: Virgina Norfolk M.D.   On: 05/22/2020 20:26   DG Chest 1 View  Result Date: 05/31/2020 CLINICAL DATA:  Follow-up left pneumothorax. EXAM: CHEST  1 VIEW COMPARISON:  May 31, 2020 FINDINGS: Left-sided injectable port in stable position. Left chest tube in stable position. Cardiomediastinal silhouette is normal. Mediastinal contours appear intact. There is no evidence of focal airspace consolidation, pleural effusion. No radiographically apparent pneumothorax. Osseous structures are without acute abnormality. Soft tissues are grossly normal. IMPRESSION: 1. No radiographically apparent pneumothorax. 2. Left chest tube in stable position. Electronically Signed   By: Fidela Salisbury M.D.   On: 05/31/2020 13:31   DG Chest 1 View  Result Date: 05/31/2020 CLINICAL DATA:  Follow-up left pneumothorax. EXAM: CHEST  1 VIEW COMPARISON:  05/30/2020 and earlier studies. FINDINGS: Left-sided chest tube is stable, curled tip projecting over the lateral left hilum. No pneumothorax. Mild atelectasis extending laterally from the left hilum and along the medial left lung base, unchanged. Lungs otherwise clear. No pleural effusion. Small amount of subcutaneous air at the left neck base, stable. IMPRESSION: 1. Stable  left-sided chest tube with no visible pneumothorax. 2. No acute findings. Mild stable atelectasis on the left as described. Electronically Signed   By: Lajean Manes M.D.   On: 05/31/2020 06:54   CT CHEST WO CONTRAST  Result Date: 05/27/2020 CLINICAL DATA:  New diagnosis of rectal cancer. EXAM: CT CHEST WITHOUT CONTRAST TECHNIQUE:  Multidetector CT imaging of the chest was performed following the standard protocol without IV contrast. COMPARISON:  Abdominal CT 05/22/2020 FINDINGS: Cardiovascular: Normal aortic caliber. Normal heart size, without pericardial effusion. Mediastinum/Nodes: No supraclavicular adenopathy. No mediastinal or definite hilar adenopathy, given limitations of unenhanced CT. Lungs/Pleura: No pleural fluid. Left base scarring or subsegmental atelectasis. Nonspecific 2 mm right upper lobe pulmonary nodule on 29/3. Calcified granuloma of 2 mm in the right lower lobe on 85/3. Upper Abdomen: Subtle hypoattenuating hepatic dome lesion again identified. The dominant right hepatic lobe mass is excluded. Normal imaged portions of the spleen, stomach, pancreas, adrenal glands, right kidney. Musculoskeletal: Presumed bone island within the sternal body. IMPRESSION: 1.  No acute process or evidence of metastatic disease in the chest. 2. Nonspecific tiny right upper lobe pulmonary nodule. No thoracic adenopathy. 3. Liver masses, as before. Electronically Signed   By: Abigail Miyamoto M.D.   On: 05/27/2020 19:34   [place] Intraoperative  Result Date: 05/23/2020 INDICATION: Patient admitted with new diagnosis of malignancy of unknown etiology now with bilateral obstructive hydronephrosis. Request made for placement of bilateral percutaneous nephrostomy catheters for urinary diversion purposes. EXAM: 1. ULTRASOUND GUIDANCE FOR PUNCTURE OF THE BILATERAL RENAL COLLECTING SYSTEMS 2. BILATERAL PERCUTANEOUS NEPHROSTOMY TUBE PLACEMENT. COMPARISON:  CT abdomen and pelvis-05/22/2020 MEDICATIONS: Ancef 2 gm IV; The  antibiotic was administered in an appropriate time frame prior to skin puncture. ANESTHESIA/SEDATION: Moderate (conscious) sedation was employed during this procedure. A total of Versed 4 mg and Fentanyl 100 mcg was administered intravenously. Moderate Sedation Time: 22 minutes. The patient's level of consciousness and vital signs were monitored continuously by radiology nursing throughout the procedure under my direct supervision. CONTRAST:  A total of 20 cc Visipaque 320 was administered into both renal collecting systems. FLUOROSCOPY TIME:  1 minute, 42 seconds (88.5 mGy) COMPLICATIONS: None immediate. PROCEDURE: The procedure, risks, benefits, and alternatives were explained to the patient. Questions regarding the procedure were encouraged and answered. The patient understands and consents to the procedure. A timeout was performed prior to the initiation of the procedure. The bilateral flanks were prepped and draped in the usual sterile fashion and a sterile drape was applied covering the operative field. A sterile gown and sterile gloves were used for the procedure. Local anesthesia was provided with 1% Lidocaine with epinephrine. Attention was first paid towards placement of the left-sided percutaneous nephrostomy catheter. Ultrasound was used to localize the left kidney. Under direct ultrasound guidance, a 20 gauge needle was advanced into the renal collecting system. An ultrasound image documentation was performed. Access within the collecting system was confirmed with the efflux of urine followed by limited contrast injection. Over a Nitrex wire, the tract was dilated with an Accustick stent. Next, under intermittent fluoroscopic guidance and over a short Amplatz wire, the track was dilated ultimately allowing placement of a 10-French percutaneous nephrostomy catheter which was advanced to the level of the renal pelvis where the coil was formed and locked. Contrast was injected and several spot fluoroscopic  images were obtained in various obliquities. The catheter was secured at the skin with a Prolene retention suture and stat lock device and connected to a gravity bag was placed. The identical procedure was repeated for the contralateral right kidney allowing placement of a 10 French percutaneous nephrostomy catheter with end ultimately coiled and locked within the right renal pelvis. Dressings were applied. The patient tolerated procedure well without immediate postprocedural complication. FINDINGS: Ultrasound scanning demonstrates a moderate to severely dilatation of the bilateral renal  collecting systems, right greater than left, as demonstrated on preceding abdominal CT. Under a combination of ultrasound and fluoroscopic guidance, a posterior inferior calix was targeted bilaterally allowing placement of bilateral 10 French percutaneous nephrostomy catheters with ends coiled and locked within the bilateral renal pelvises. Contrast injection confirmed appropriate positioning. IMPRESSION: Successful ultrasound and fluoroscopic guided placement of bilateral 10 French PCNs. Electronically Signed   By: Sandi Mariscal M.D.   On: 05/23/2020 12:55   US RENAL  Result Date: 05/23/2020 CLINICAL DATA:  Acute kidney injury EXAM: RENAL / URINARY TRACT ULTRASOUND COMPLETE COMPARISON:  CT 05/22/2020 FINDINGS: Right Kidney: Renal measurements: 11.5 x 5.2 x 5.5 cm = volume: 170 mL. Mild renal cortical thinning with increased cortical echogenicity. Severe right hydronephrosis. The visualized right ureter is moderately dilated. No discrete shadowing stone or mass lesion identified. Left Kidney: Renal measurements: 12.5 x 5.2 x 4.7 cm = volume: 162 mL. Cortical thickness within normal limits. Increased renal cortical echogenicity. Moderate to severe hydronephrosis. Visualized left ureter is mildly dilated. No discrete shadowing stone or mass lesion identified. Bladder: Partially distended.  No definite abnormality. Other: Inferior  right lower lobe solid intrahepatic mass measuring up to 6.0 cm. Irregular and heterogeneous appearance of the uterus is partially visualized on images within the pelvis, incompletely characterized. IMPRESSION: 1. Severe right hydroureteronephrosis. 2. Moderate to severe left hydronephrosis. 3. Increased renal cortical echogenicity bilaterally suggesting medical renal disease. 4. Very irregular appearance of visualized portion of the uterus noted on transabdominal imaging through the pelvis. Recommend dedicated pelvic ultrasound for further evaluation. 5. Solid 6.0 cm inferior right hepatic lobe mass most suggestive of metastatic disease based on the previous CT. Electronically Signed   By: Davina Poke D.O.   On: 05/23/2020 10:04   US BIOPSY (LIVER)  Result Date: 05/26/2020 INDICATION: Rectal mass with associated liver masses. EXAM: ULTRASOUND GUIDED CORE BIOPSY OF LIVER MEDICATIONS: None. ANESTHESIA/SEDATION: Fentanyl 1.0 mcg IV; Versed 50 mg IV Moderate Sedation Time:  15 minutes. The patient was continuously monitored during the procedure by the interventional radiology nurse under my direct supervision. PROCEDURE: The procedure, risks, benefits, and alternatives were explained to the patient. Questions regarding the procedure were encouraged and answered. The patient understands and consents to the procedure. A time-out was performed prior to initiating the procedure. Ultrasound was performed to localize liver lesions. The abdominal wall was prepped with chlorhexidine in a sterile fashion, and a sterile drape was applied covering the operative field. A sterile gown and sterile gloves were used for the procedure. Local anesthesia was provided with 1% Lidocaine. Under ultrasound guidance, a 41 gauge trocar needle was advanced into the liver at the level of a lesion within the right lobe. Three separate coaxial 18 gauge core biopsy samples were obtained with an 18 gauge device. Material was submitted in  formalin. Gel-Foam pledgets were advanced through the outer needle as it was retracted and removed. Additional ultrasound was performed. COMPLICATIONS: None immediate. FINDINGS: The largest mass in the liver as noted by CT is within the inferior aspect of the right lobe. By ultrasound this lesion measures approximately 6.5 x 5.1 x 6.1 cm. Solid tissue was obtained. IMPRESSION: Ultrasound-guided core biopsy performed at the level of a 6.5 cm mass within the inferior aspect of the right lobe of the liver. Electronically Signed   By: Aletta Edouard M.D.   On: 05/26/2020 15:27   DG Chest Port 1 View  Result Date: 05/30/2020 CLINICAL DATA:  Follow-up pneumothorax EXAM: PORTABLE CHEST 1 VIEW  COMPARISON:  Yesterday FINDINGS: Pigtail catheter over the left mid chest with no visible pneumothorax remaining. Small volume soft tissue gas at the left apex. Left-sided porta catheter with tip at the distal left brachiocephalic. Retrocardiac atelectasis. Artifact from EKG leads. IMPRESSION: No visible residual pneumothorax. Electronically Signed   By: Monte Fantasia M.D.   On: 05/30/2020 04:31   DG Chest Port 1 View  Result Date: 05/29/2020 CLINICAL DATA:  Follow-up pneumothorax EXAM: PORTABLE CHEST 1 VIEW COMPARISON:  Earlier same day FINDINGS: Left Port-A-Cath remains in place. Left pneumothorax is larger, increased from about 10% to about 25%. No evidence of tension or mediastinal shift. Mild atelectasis the left lung base. IMPRESSION: Enlarging left pneumothorax, 10% to about 25%. No evidence of tension or mediastinal shift. These results will be called to the ordering clinician or representative by the Radiologist Assistant, and communication documented in the PACS or [employer]. Electronically Signed   By: Nelson Chimes M.D.   On: 05/29/2020 19:20   DG Chest Port 1 View  Result Date: 05/29/2020 CLINICAL DATA:  54 year old female status post Port-A-Cath placement. EXAM: PORTABLE CHEST 1 VIEW COMPARISON:   Chest CT dated 05/27/2020. FINDINGS: Left-sided Port-A-Cath with tip over central SVC. There is a small left apical pneumothorax measuring approximately 2.4 cm in thickness. No focal consolidation, or pleural effusion. The cardiac silhouette is within limits. No acute osseous pathology. Small pockets of soft tissue air noted over the left clavicle. IMPRESSION: Status post Port-A-Cath placement.  Small left apical pneumothorax. These results were called by telephone at the time of interpretation on 05/29/2020 at 4:26 pm to nurse Maree Krabbe, who verbally acknowledged these results. Electronically Signed   By: Anner Crete M.D.   On: 05/29/2020 16:33   DG C-Arm 1-60 Min-No Report  Result Date: 05/29/2020 Fluoroscopy was utilized by the requesting physician.  No radiographic interpretation.   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 Request made for placement of bilateral percutaneous nephrostomy catheters for urinary diversion purposes. EXAM: 1. ULTRASOUND GUIDANCE FOR PUNCTURE OF THE BILATERAL RENAL COLLECTING SYSTEMS 2. BILATERAL PERCUTANEOUS NEPHROSTOMY TUBE PLACEMENT. COMPARISON:  CT abdomen and pelvis-05/22/2020 MEDICATIONS: Ancef 2 gm IV; The antibiotic was administered in an appropriate time frame prior to skin puncture. ANESTHESIA/SEDATION: Moderate (conscious) sedation was employed during this procedure. A total of Versed 4 mg and Fentanyl 100 mcg was administered intravenously. Moderate Sedation Time: 22 minutes. The patient's level of consciousness and vital signs were monitored continuously by radiology nursing throughout the procedure under my direct supervision. CONTRAST:  A total of 20 cc Visipaque 320 was administered into both renal collecting systems. FLUOROSCOPY TIME:  1 minute, 42 seconds (16.1 mGy) COMPLICATIONS: None immediate. PROCEDURE: The  procedure, risks, benefits, and alternatives were explained to the patient. Questions regarding the procedure were encouraged and answered. The patient understands and consents to the procedure. A timeout was performed prior to the initiation of the procedure. The bilateral flanks were prepped and draped in the usual sterile fashion and a sterile drape was applied covering the operative field. A sterile gown and sterile gloves were used for the procedure. Local anesthesia was provided with 1% Lidocaine with epinephrine. Attention was first paid towards placement of the left-sided percutaneous nephrostomy catheter. Ultrasound was used to localize the left kidney. Under direct ultrasound guidance, a 20 gauge needle was advanced into the renal collecting system. An ultrasound image documentation was performed.  Access within the collecting system was confirmed with the efflux of urine followed by limited contrast injection. Over a Nitrex wire, the tract was dilated with an Accustick stent. Next, under intermittent fluoroscopic guidance and over a short Amplatz wire, the track was dilated ultimately allowing placement of a 10-French percutaneous nephrostomy catheter which was advanced to the level of the renal pelvis where the coil was formed and locked. Contrast was injected and several spot fluoroscopic images were obtained in various obliquities. The catheter was secured at the skin with a Prolene retention suture and stat lock device and connected to a gravity bag was placed. The identical procedure was repeated for the contralateral right kidney allowing placement of a 10 French percutaneous nephrostomy catheter with end ultimately coiled and locked within the right renal pelvis. Dressings were applied. The patient tolerated procedure well without immediate postprocedural complication. FINDINGS: Ultrasound scanning demonstrates a moderate to severely dilatation of the bilateral renal collecting systems, right  greater than left, as demonstrated on preceding abdominal CT. Under a combination of ultrasound and fluoroscopic guidance, a posterior inferior calix was targeted bilaterally allowing placement of bilateral 10 French percutaneous nephrostomy catheters with ends coiled and locked within the bilateral renal pelvises. Contrast injection confirmed appropriate positioning. IMPRESSION: Successful ultrasound and fluoroscopic guided placement of bilateral 10 French PCNs. Electronically Signed   By: Sandi Mariscal M.D.   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 Request made for placement of bilateral percutaneous nephrostomy catheters for urinary diversion purposes. EXAM: 1. ULTRASOUND GUIDANCE FOR PUNCTURE OF THE BILATERAL RENAL COLLECTING SYSTEMS 2. BILATERAL PERCUTANEOUS NEPHROSTOMY TUBE PLACEMENT. COMPARISON:  CT abdomen and pelvis-05/22/2020 MEDICATIONS: Ancef 2 gm IV; The antibiotic was administered in an appropriate time frame prior to skin puncture. ANESTHESIA/SEDATION: Moderate (conscious) sedation was employed during this procedure. A total of Versed 4 mg and Fentanyl 100 mcg was administered intravenously. Moderate Sedation Time: 22 minutes. The patient's level of consciousness and vital signs were monitored continuously by radiology nursing throughout the procedure under my direct supervision. CONTRAST:  A total of 20 cc Visipaque 320 was administered into both renal collecting systems. FLUOROSCOPY TIME:  1 minute, 42 seconds (63.1 mGy) COMPLICATIONS: None immediate. PROCEDURE: The procedure, risks, benefits, and alternatives were explained to the patient. Questions regarding the procedure were encouraged and answered. The patient understands and consents to the procedure. A timeout was performed prior to the initiation of the procedure. The  bilateral flanks were prepped and draped in the usual sterile fashion and a sterile drape was applied covering the operative field. A sterile gown and sterile gloves were used for the procedure. Local anesthesia was provided with 1% Lidocaine with epinephrine. Attention was first paid towards placement of the left-sided percutaneous nephrostomy catheter. Ultrasound was used to localize the left kidney. Under direct ultrasound guidance, a 20 gauge needle was advanced into the renal collecting system. An ultrasound image documentation was performed. Access within the collecting system was confirmed with the efflux of urine followed by limited contrast injection. Over a Nitrex wire, the tract was dilated with an Accustick stent. Next, under intermittent fluoroscopic guidance and over a short Amplatz wire, the track was dilated ultimately allowing placement of a 10-French percutaneous nephrostomy catheter which was advanced to the level of the renal pelvis where the coil was formed and locked. Contrast was injected and several spot fluoroscopic images were obtained  in various obliquities. The catheter was secured at the skin with a Prolene retention suture and stat lock device and connected to a gravity bag was placed. The identical procedure was repeated for the contralateral right kidney allowing placement of a 10 French percutaneous nephrostomy catheter with end ultimately coiled and locked within the right renal pelvis. Dressings were applied. The patient tolerated procedure well without immediate postprocedural complication. 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 Under a combination of ultrasound and fluoroscopic guidance, a posterior inferior calix was targeted bilaterally allowing placement of bilateral 10 French percutaneous nephrostomy catheters with ends coiled  and locked within the bilateral renal pelvises. Contrast injection confirmed appropriate positioning. IMPRESSION: Successful ultrasound and fluoroscopic guided placement of bilateral 10 French PCNs. Electronically Signed   By: Sandi Mariscal M.D.   On: 05/23/2020 12:55     Assessment and plan- Patient is a 54 y.o. female who presents to [facility] for initial meeting in preparation for starting chemotherapy for the treatment of stage IV colon cancer.   1. HPI: Mrs. Newstrom is a 54 year old female with past medical history significant for acute colitis, acute renal failure, history of hydronephrosis who recently presented to the emergency room for a 46-month history of rectal bleeding.  She was found to have a hemoglobin of 6 and was guaiac positive.  Imaging revealed severe sigmoid colitis of sigmoid mass due to lesion consistent with neoplastic process.  There was also a liver metastasis noted to have severe bilateral hydronephrosis secondary to neoplastic process.  She required several units of packed red blood cells.  She was also found to have acute kidney injury with a creatinine of 9.  Had emergent bilateral nephrostomy tube placed by IR.  Had flexible sigmoidoscopy which provided visual confirmation of the mass.  She had a port placed and developed a pneumothorax.  Had chest tube placed which was removed prior to discharge.  Plan is to begin FOLFOX chemotherapy ASAP.  2. Chemo Care Clinic/High Risk for ER/Hospitalization during chemotherapy- We discussed the role of the chemo care clinic and identified patient specific risk factors. I discussed that patient was identified as high risk primarily based on: Stage of disease.  Patient has past medical history positive for: No past medical history on file.  Patient has past surgical history positive for: Past Surgical History:  Procedure Laterality Date  . COLONOSCOPY WITH PROPOFOL N/A 05/27/2020   Procedure: COLONOSCOPY WITH PROPOFOL;  Surgeon:  [name], MD;  Location: [facility] ENDOSCOPY;  Service: Gastroenterology;  Laterality: N/A;  . FLEXIBLE SIGMOIDOSCOPY N/A 05/28/2020   Procedure: FLEXIBLE SIGMOIDOSCOPY;  Surgeon: [name], MD;  Location: [facility] ENDOSCOPY;  Service: Gastroenterology;  Laterality: N/A;  . IR NEPHROSTOMY PLACEMENT LEFT  05/23/2020  . IR NEPHROSTOMY PLACEMENT RIGHT  05/23/2020  . PORTACATH PLACEMENT Left 05/29/2020   Procedure: INSERTION PORT-A-CATH;  Surgeon: [name], MD;  Location: [facility] ORS;  Service: General;  Laterality: Left;    Based on our high risk symptom management report; this patient has a high risk of ED utilization.  The percentage below indicates how "at risk "  this patient based on the factors in this table within one year.   General Risk Score: 3  Values used to calculate this score:   Points  Metrics      0        Age: 53  2        Hospital Admissions: 2      1        ED Visits: 1      0        Has Chronic Obstructive Pulmonary Disease: No      0        Has Diabetes: No      0        Has Congestive Heart Failure: No      0        Has liver disease: No      0        Has Depression: No      0        Current PCP: Volney American, PA-C      0        Has Medicaid: No    3. We discussed that social determinants of health may have significant impacts on health and outcomes for cancer patients.  Today we discussed specific social determinants of performance status, alcohol use, depression, financial needs, food insecurity, housing, interpersonal violence, social connections, stress, tobacco use, and transportation.    After lengthy discussion the following were identified as areas of need: None at this time  Outpatient services: We discussed options including home based and outpatient services, DME and care program. We discusssed that patients who participate in regular physical activity report fewer negative impacts of cancer and treatments and report less  fatigue.   Financial Concerns: We discussed that living with cancer can create tremendous financial burden.  We discussed options for assistance. I asked that if assistance is needed in affording medications or paying bills to please let us know so that we can provide assistance. We discussed options for food including social services, [employer] ($50 every 2 weeks) and onsite food pantry.  We will also notify [facility] to see if cancer center can provide additional support.  Referral to Social work: Introduced Education officer, museum Elease Etienne and the services he can provide such as support with [employer], cell phone and gas vouchers.   Support groups: We discussed options for support groups at the cancer center. If interested, please notify nurse navigator to enroll. We discussed options for managing stress including healthy eating, exercise as well as participating in no charge counseling services at the cancer center and support groups.  If these are of interest, patient can notify either myself or primary nursing team.We discussed options for management including medications and referral to quit Smart program  Transportation: We discussed options for transportation including acta, paratransit, bus routes, link transit, taxi/uber/lyft, and cancer center [place].  I have notified primary oncology team who will help assist with arranging Lucianne Lei transportation for appointments when/if needed. We also discussed options for transportation on short notice/acute visits.  Palliative care services: We have palliative care services available in the cancer center to discuss goals of care and advanced care planning.  Please let us know if you have any questions or would like to speak to our palliative nurse practitioner.  Symptom Management Clinic: We discussed our symptom management clinic which is available for acute concerns while receiving treatment such as nausea, vomiting or diarrhea.  We can be reached  via telephone at [phone] or through my chart.  We are available for virtual or in person visits on the same day from 830 to 4 PM Monday through Friday. She denies needing specific assistance at this time and She will be  followed by Dr. Virgel Manifold clinical team.  Plan: Discussed symptom management clinic. Discussed palliative care services. Discussed resources that are available here at the cancer center. Discussed medications and new prescriptions to begin treatment such as anti-nausea or steroids.   Disposition: RTC on 06/03/2020 for hospital follow-up. Return to clinic on 06/04/20 for palliative care consult. Return to clinic on 06/04/2020 to begin cycle 1.  Visit Diagnosis 1. Metastatic colorectal cancer [facility])     Patient expressed understanding and was in agreement with this plan. She also understands that She can call clinic at any time with any questions, concerns, or complaints.   Greater than 50% was spent in counseling and coordination of care with this patient including but not limited to discussion of the relevant topics above (See A&P) including, but not limited to diagnosis and management of acute and chronic medical conditions.   [place] [facility] [place]  CC: Dr. Grayland Ormond

## 2020-06-04 ENCOUNTER — Inpatient Hospital Stay (HOSPITAL_BASED_OUTPATIENT_CLINIC_OR_DEPARTMENT_OTHER): Payer: Medicaid Other | Admitting: Hospice and Palliative Medicine

## 2020-06-04 ENCOUNTER — Inpatient Hospital Stay: Payer: Medicaid Other

## 2020-06-04 ENCOUNTER — Telehealth: Payer: Self-pay

## 2020-06-04 ENCOUNTER — Encounter: Payer: Self-pay | Admitting: Hospice and Palliative Medicine

## 2020-06-04 ENCOUNTER — Other Ambulatory Visit: Payer: Self-pay

## 2020-06-04 VITALS — BP 101/73 | HR 106 | Temp 97.6°F | Resp 20 | Ht 60.0 in | Wt 132.2 lb

## 2020-06-04 VITALS — HR 92

## 2020-06-04 DIAGNOSIS — C19 Malignant neoplasm of rectosigmoid junction: Secondary | ICD-10-CM | POA: Diagnosis not present

## 2020-06-04 DIAGNOSIS — Z515 Encounter for palliative care: Secondary | ICD-10-CM

## 2020-06-04 MED ORDER — SODIUM CHLORIDE 0.9 % IV SOLN
2400.0000 mg/m2 | INTRAVENOUS | Status: DC
  Administered 2020-06-04: 3800 mg via INTRAVENOUS
  Filled 2020-06-04: qty 76

## 2020-06-04 MED ORDER — PALONOSETRON HCL INJECTION 0.25 MG/5ML
0.2500 mg | Freq: Once | INTRAVENOUS | Status: AC
  Administered 2020-06-04: 0.25 mg via INTRAVENOUS
  Filled 2020-06-04: qty 5

## 2020-06-04 MED ORDER — SODIUM CHLORIDE 0.9 % IV SOLN
10.0000 mg | Freq: Once | INTRAVENOUS | Status: AC
  Administered 2020-06-04: 10 mg via INTRAVENOUS
  Filled 2020-06-04: qty 10

## 2020-06-04 MED ORDER — OXALIPLATIN CHEMO INJECTION 100 MG/20ML
85.0000 mg/m2 | Freq: Once | INTRAVENOUS | Status: AC
  Administered 2020-06-04: 135 mg via INTRAVENOUS
  Filled 2020-06-04: qty 20

## 2020-06-04 MED ORDER — DEXTROSE 5 % IV SOLN
Freq: Once | INTRAVENOUS | Status: AC
  Filled 2020-06-04: qty 250

## 2020-06-04 MED ORDER — LEUCOVORIN CALCIUM INJECTION 350 MG
650.0000 mg | Freq: Once | INTRAVENOUS | Status: AC
  Administered 2020-06-04: 650 mg via INTRAVENOUS
  Filled 2020-06-04: qty 10

## 2020-06-04 MED ORDER — PROCHLORPERAZINE MALEATE 10 MG PO TABS: 10.0000 mg | ORAL_TABLET | Freq: Four times a day (QID) | ORAL | 2 refills | Status: DC | PRN

## 2020-06-04 MED ORDER — FLUOROURACIL CHEMO INJECTION 2.5 GM/50ML
400.0000 mg/m2 | Freq: Once | INTRAVENOUS | Status: AC
  Administered 2020-06-04: 650 mg via INTRAVENOUS
  Filled 2020-06-04: qty 13

## 2020-06-04 MED ORDER — ONDANSETRON HCL 8 MG PO TABS: 8.0000 mg | ORAL_TABLET | Freq: Two times a day (BID) | ORAL | 2 refills | Status: DC | PRN

## 2020-06-04 NOTE — Telephone Encounter (Signed)
-----   Message from [name], MD sent at 06/04/2020 12:17 AM EDT ----- Regarding: Post-op follow up Hi,  I saw this patient in the hospital last week, with metastatic colorectal cancer.  Had a port-a-cath placed on 16/10 which had complication of pneumothorax, had chest tube placed and eventually discharged on 10/31.  Can y'all contact her to set up a follow up appointment in about a week?  Thanks,  [employer]

## 2020-06-04 NOTE — Progress Notes (Signed)
Patient would like to know if it is ok to take imodium. She states she is having some diarrhea frequently.

## 2020-06-04 NOTE — Telephone Encounter (Signed)
Spoke with patient notified her of Dr.Piscoya recommendations and patient is scheduled for Friday November 12th, 2021 at 11:00am. Patient states she would contact our office back for directions to the office, she was at her doctor's appointment for infusion and chemotherapy treatment.

## 2020-06-04 NOTE — Progress Notes (Signed)
Per Ruthell Rummage CMA per Lindi Adie NP okay to proceed with scheduled FolFox treatment with HR 106.  1005: imaging reports states that left sided port tip is located at the "distal left brachiocephalic", Per Lindi Adie NP per Dr. Tasia Catchings, okay to proceed with Folfox treatment using port.   1320: Pt tolerated treatment well, no s/s of distress or reaction noted. Home pump education performed, pt verbalizes understanding. Pt stable at discharge.

## 2020-06-04 NOTE — Progress Notes (Signed)
[place]  [phone] [phone]   Name: Anita Sullivan Date: 06/04/2020 MRN: [id number]  DOB: 12/11/1965  Patient Care Team: Volney American, PA-C as PCP - General (Family Medicine) [name], MD as Consulting Physician (Oncology) [name], RN as Oncology Nurse Navigator    REASON FOR CONSULTATION: Anita Sullivan is a 54 y.o. female with with no significant previous medical history who was hospitalized 05/22/2020-06/01/2020 with rectal bleeding.  Patient was found to have severe anemia and elevated serum creatinine.  CT of the abdomen and pelvis on 10/21 revealed severe sigmoid colitis with numerous soft tissue nodularity, multiple liver masses, severe periaortic lymphadenopathy concerning for metastatic disease.  Patient was also found to have bilateral hydronephrosis and is status post nephrostomy tubes.  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  Patient is status post flex sig on 05/28/2020 due to persistent rectal bleeding.  She underwent hemostasis procedure.  Patient also had a port placed while in the hospital and unfortunately developed postop pneumothorax, which eventually resolved with chest tube. Palliative care was consulted help address goals.  SOCIAL HISTORY:     reports that she has never smoked. She has never used smokeless tobacco. She reports that she does not drink alcohol and does not use drugs.  ADVANCE DIRECTIVES:  Patient is divorced.  She lives at home with her father.  She has a son and daughter who are involved in her care.  Her daughter lives locally and her son is in [place]. 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  She is Catholic and is involved in her church.  CODE STATUS:   PAST MEDICAL HISTORY:No past medical history on  file.  PAST SURGICAL HISTORY:  Past Surgical History:  Procedure Laterality Date   COLONOSCOPY WITH PROPOFOL N/A 05/27/2020   Procedure: COLONOSCOPY WITH PROPOFOL;  Surgeon: [name], MD;  Location: [facility] ENDOSCOPY;  Service: Gastroenterology;  Laterality: N/A;   FLEXIBLE SIGMOIDOSCOPY N/A 05/28/2020   Procedure: FLEXIBLE SIGMOIDOSCOPY;  Surgeon: [name], MD;  Location: [facility] ENDOSCOPY;  Service: Gastroenterology;  Laterality: N/A;   IR NEPHROSTOMY PLACEMENT LEFT  05/23/2020   IR NEPHROSTOMY PLACEMENT RIGHT  05/23/2020   PORTACATH PLACEMENT Left 05/29/2020   Procedure: INSERTION PORT-A-CATH;  Surgeon: [name], MD;  Location: [facility] ORS;  Service: General;  Laterality: Left;    HEMATOLOGY/ONCOLOGY HISTORY:  Oncology History  Metastatic colorectal cancer ([place])  05/28/2020 Initial Diagnosis   Metastatic colorectal cancer ([place])   05/31/2020 Cancer Staging   Staging form: Colon and Rectum, AJCC 8th Edition - Clinical stage from 05/31/2020: Stage IVC (cTX, cNX, pM1c) - Signed by [name], MD on 05/31/2020   06/04/2020 -  Chemotherapy   The patient had palonosetron (ALOXI) injection 0.25 mg, 0.25 mg, Intravenous,  Once, 0 of 12 cycles leucovorin 632 mg in dextrose 5 % 250 mL infusion, 400 mg/m2 = 632 mg, Intravenous,  Once, 0 of 12 cycles oxaliplatin (ELOXATIN) 135 mg in dextrose 5 % 500 mL chemo infusion, 85 mg/m2 = 135 mg, Intravenous,  Once, 0 of 12 cycles fluorouracil (ADRUCIL) chemo injection 650 mg, 400 mg/m2 = 650 mg, Intravenous,  Once, 0 of 12 cycles fluorouracil (ADRUCIL) 3,800 mg in sodium chloride 0.9 % 74 mL chemo infusion, 2,400 mg/m2 = 3,800 mg, Intravenous, 1 Day/Dose, 0 of 12 cycles bevacizumab-bvzr (ZIRABEV) 300 mg in sodium chloride  0.9 % 100 mL chemo infusion, 5 mg/kg = 300 mg, Intravenous,  Once, 0 of 11 cycles  for chemotherapy treatment.      ALLERGIES:  has No Known Allergies.  MEDICATIONS:  Current Outpatient Medications   Medication Sig Dispense Refill   lidocaine-prilocaine (EMLA) cream Apply to affected area once 30 g 3   ondansetron (ZOFRAN) 8 MG tablet Take 1 tablet (8 mg total) by mouth 2 (two) times daily as needed for refractory nausea / vomiting. 60 tablet 2   oxyCODONE (OXY IR/ROXICODONE) 5 MG immediate release tablet Take 1 tablet (5 mg total) by mouth every 4 (four) hours as needed for moderate pain or severe pain. 14 tablet 0   pantoprazole (PROTONIX) 40 MG tablet Take 1 tablet (40 mg total) by mouth 2 (two) times daily. 60 tablet 0   prochlorperazine (COMPAZINE) 10 MG tablet Take 1 tablet (10 mg total) by mouth every 6 (six) hours as needed (Nausea or vomiting). 60 tablet 2   acetaminophen (TYLENOL) 500 MG tablet Take 1,000 mg by mouth every 6 (six) hours as needed.  (Patient not taking: Reported on 06/04/2020)     No current facility-administered medications for this visit.    VITAL SIGNS: BP 101/73 (BP Location: Right Arm, Patient Position: Sitting, Cuff Size: Normal)    Pulse (!) 106    Temp 97.6 F (36.4 C) (Tympanic)    Resp 20    Ht 5' (1.524 m)    Wt 132 lb 3.2 oz (60 kg)    SpO2 100%    BMI 25.82 kg/m  Filed Weights   06/04/20 0858  Weight: 132 lb 3.2 oz (60 kg)    Estimated body mass index is 25.82 kg/m as calculated from the following:   Height as of this encounter: 5' (1.524 m).   Weight as of this encounter: 132 lb 3.2 oz (60 kg).  LABS: CBC:    Component Value Date/Time   WBC 9.2 06/03/2020 0840   HGB 9.9 (L) 06/03/2020 0840   HGB 13.0 09/04/2018 1434   HCT 31.7 (L) 06/03/2020 0840   HCT 37.4 09/04/2018 1434   PLT 420 (H) 06/03/2020 0840   PLT 331 09/04/2018 1434   MCV 84.5 06/03/2020 0840   MCV 92 09/04/2018 1434   NEUTROABS 6.7 06/03/2020 0840   NEUTROABS 5.2 09/04/2018 1434   LYMPHSABS 1.6 06/03/2020 0840   LYMPHSABS 1.9 09/04/2018 1434   MONOABS 0.6 06/03/2020 0840   EOSABS 0.2 06/03/2020 0840   EOSABS 0.2 09/04/2018 1434   BASOSABS 0.1 06/03/2020 0840     BASOSABS 0.0 09/04/2018 1434   Comprehensive Metabolic Panel:    Component Value Date/Time   NA 139 06/03/2020 0840   K 3.6 06/03/2020 0840   CL 100 06/03/2020 0840   CO2 29 06/03/2020 0840   BUN 20 06/03/2020 0840   CREATININE 1.48 (H) 06/03/2020 0840   GLUCOSE 119 (H) 06/03/2020 0840   CALCIUM 8.7 (L) 06/03/2020 0840   AST 23 06/03/2020 0840   ALT 9 06/03/2020 0840   ALKPHOS 59 06/03/2020 0840   BILITOT 0.4 06/03/2020 0840   PROT 8.0 06/03/2020 0840   ALBUMIN 3.3 (L) 06/03/2020 0840    RADIOGRAPHIC STUDIES: CT ABDOMEN PELVIS WO CONTRAST  Result Date: 05/22/2020 CLINICAL DATA:  Intermittent rectal bleeding. EXAM: CT ABDOMEN AND PELVIS WITHOUT CONTRAST TECHNIQUE: Multidetector CT imaging of the abdomen and pelvis was performed following the standard protocol without IV contrast. COMPARISON:  None. FINDINGS: Lower chest: No acute abnormality. Hepatobiliary: A  2.9 cm x 3.1 cm ill-defined area of heterogeneous low attenuation is seen within the lateral aspect of the liver dome (axial CT image 17, CT series number 2). An ill-defined, slightly exophytic 6.9 cm x 5.0 cm area of heterogeneous mildly decreased attenuation is seen within the posterior aspect of the right lobe of the liver. An ill-defined central area of low attenuation is noted within this region. No gallstones, gallbladder wall thickening, or biliary dilatation. Pancreas: Unremarkable. No pancreatic ductal dilatation or surrounding inflammatory changes. Spleen: Normal in size without focal abnormality. Adrenals/Urinary Tract: Adrenal glands are unremarkable. Kidneys are normal in size, without focal lesions. Marked severity bilateral hydronephrosis and hydroureter is seen. The urinary bladder is partially contracted and subsequently limited in evaluation. Stomach/Bowel: Stomach is within normal limits. Appendix appears normal. No evidence of bowel dilatation. There is marked severity thickening of the mid and distal sigmoid colon  associated pericolonic inflammatory fat stranding is noted. Vascular/Lymphatic: No significant vascular findings are present. There is moderate severity para-aortic and aortocaval lymphadenopathy. The largest lymph node measures approximately 1.7 cm x 1.7 cm along the para-aortic region at the level of the kidney. It should be noted that this area is limited in evaluation in the absence of intravenous contrast. A 2.8 cm x 2.4 cm heterogeneous soft tissue mass is seen along the posterior aspect of the distal left iliac vessels (axial CT images 68 through 74, CT series number 2). Numerous subcentimeter soft tissue nodules are seen within the mesentery of the pelvis, along the midline (axial CT images 58 through 66, CT series number 2). Numerous additional small soft tissue nodules are seen surrounding the distal sigmoid colon and rectosigmoid junction. Reproductive: The uterus is mildly enlarged. The uterine fundus is positioned to the left of midline within the anterior aspect of the pelvis. Other: No abdominal wall hernia or abnormality. A small amount of abdominal and pelvic fluid is noted. Musculoskeletal: No acute or significant osseous findings. IMPRESSION: 1. Marked severity sigmoid colitis. Due to the numerous areas of soft tissue nodularity within the mesentery in this region, an underlying neoplastic process cannot be excluded. Correlation with colonoscopy is recommended. 2. Heterogeneous liver masses, as described above, suspicious for an underlying neoplasm. MRI correlation is recommended. 3. Moderate severity para-aortic and aortocaval lymphadenopathy with an additional enlarged, irregular appearing lymph node seen along the posterior aspect of the distal left iliac vessels. This is also worrisome for sequelae associated with an underlying neoplasm. 4. Marked severity bilateral hydronephrosis and hydroureter which likely represents partial renal obstruction secondary to the inflammatory and suspected  neoplastic process seen within the pelvis. Electronically Signed   By: Virgina Norfolk M.D.   On: 05/22/2020 20:26   DG Chest 1 View  Result Date: 05/31/2020 CLINICAL DATA:  Follow-up left pneumothorax. EXAM: CHEST  1 VIEW COMPARISON:  May 31, 2020 FINDINGS: Left-sided injectable port in stable position. Left chest tube in stable position. Cardiomediastinal silhouette is normal. Mediastinal contours appear intact. There is no evidence of focal airspace consolidation, pleural effusion. No radiographically apparent pneumothorax. Osseous structures are without acute abnormality. Soft tissues are grossly normal. IMPRESSION: 1. No radiographically apparent pneumothorax. 2. Left chest tube in stable position. Electronically Signed   By: Fidela Salisbury M.D.   On: 05/31/2020 13:31   DG Chest 1 View  Result Date: 05/31/2020 CLINICAL DATA:  Follow-up left pneumothorax. EXAM: CHEST  1 VIEW COMPARISON:  05/30/2020 and earlier studies. FINDINGS: Left-sided chest tube is stable, curled tip projecting over the lateral left  hilum. No pneumothorax. Mild atelectasis extending laterally from the left hilum and along the medial left lung base, unchanged. Lungs otherwise clear. No pleural effusion. Small amount of subcutaneous air at the left neck base, stable. IMPRESSION: 1. Stable left-sided chest tube with no visible pneumothorax. 2. No acute findings. Mild stable atelectasis on the left as described. Electronically Signed   By: Lajean Manes M.D.   On: 05/31/2020 06:54   CT CHEST WO CONTRAST  Result Date: 05/27/2020 CLINICAL DATA:  New diagnosis of rectal cancer. EXAM: CT CHEST WITHOUT CONTRAST TECHNIQUE: Multidetector CT imaging of the chest was performed following the standard protocol without IV contrast. COMPARISON:  Abdominal CT 05/22/2020 FINDINGS: Cardiovascular: Normal aortic caliber. Normal heart size, without pericardial effusion. Mediastinum/Nodes: No supraclavicular adenopathy. No mediastinal or  definite hilar adenopathy, given limitations of unenhanced CT. Lungs/Pleura: No pleural fluid. Left base scarring or subsegmental atelectasis. Nonspecific 2 mm right upper lobe pulmonary nodule on 29/3. Calcified granuloma of 2 mm in the right lower lobe on 85/3. Upper Abdomen: Subtle hypoattenuating hepatic dome lesion again identified. The dominant right hepatic lobe mass is excluded. Normal imaged portions of the spleen, stomach, pancreas, adrenal glands, right kidney. Musculoskeletal: Presumed bone island within the sternal body. IMPRESSION: 1.  No acute process or evidence of metastatic disease in the chest. 2. Nonspecific tiny right upper lobe pulmonary nodule. No thoracic adenopathy. 3. Liver masses, as before. Electronically Signed   By: Abigail Miyamoto M.D.   On: 05/27/2020 19:34   [place] Intraoperative  Result Date: 05/23/2020 INDICATION: Patient admitted with new diagnosis of malignancy of unknown etiology now with bilateral obstructive hydronephrosis. Request made for placement of bilateral percutaneous nephrostomy catheters for urinary diversion purposes. EXAM: 1. ULTRASOUND GUIDANCE FOR PUNCTURE OF THE BILATERAL RENAL COLLECTING SYSTEMS 2. BILATERAL PERCUTANEOUS NEPHROSTOMY TUBE PLACEMENT. COMPARISON:  CT abdomen and pelvis-05/22/2020 MEDICATIONS: Ancef 2 gm IV; The antibiotic was administered in an appropriate time frame prior to skin puncture. ANESTHESIA/SEDATION: Moderate (conscious) sedation was employed during this procedure. A total of Versed 4 mg and Fentanyl 100 mcg was administered intravenously. Moderate Sedation Time: 22 minutes. The patient's level of consciousness and vital signs were monitored continuously by radiology nursing throughout the procedure under my direct supervision. CONTRAST:  A total of 20 cc Visipaque 320 was administered into both renal collecting systems. FLUOROSCOPY TIME:  1 minute, 42 seconds (58.0 mGy) COMPLICATIONS: None immediate. PROCEDURE: The procedure, risks,  benefits, and alternatives were explained to the patient. Questions regarding the procedure were encouraged and answered. The patient understands and consents to the procedure. A timeout was performed prior to the initiation of the procedure. The bilateral flanks were prepped and draped in the usual sterile fashion and a sterile drape was applied covering the operative field. A sterile gown and sterile gloves were used for the procedure. Local anesthesia was provided with 1% Lidocaine with epinephrine. Attention was first paid towards placement of the left-sided percutaneous nephrostomy catheter. Ultrasound was used to localize the left kidney. Under direct ultrasound guidance, a 20 gauge needle was advanced into the renal collecting system. An ultrasound image documentation was performed. Access within the collecting system was confirmed with the efflux of urine followed by limited contrast injection. Over a Nitrex wire, the tract was dilated with an Accustick stent. Next, under intermittent fluoroscopic guidance and over a short Amplatz wire, the track was dilated ultimately allowing placement of a 10-French percutaneous nephrostomy catheter which was advanced to the level of the renal pelvis where the coil  was formed and locked. Contrast was injected and several spot fluoroscopic images were obtained in various obliquities. The catheter was secured at the skin with a Prolene retention suture and stat lock device and connected to a gravity bag was placed. The identical procedure was repeated for the contralateral right kidney allowing placement of a 10 French percutaneous nephrostomy catheter with end ultimately coiled and locked within the right renal pelvis. Dressings were applied. The patient tolerated procedure well without immediate postprocedural complication. FINDINGS: Ultrasound scanning demonstrates a moderate to severely dilatation of the bilateral renal collecting systems, right greater than left, as  demonstrated on preceding abdominal CT. Under a combination of ultrasound and fluoroscopic guidance, a posterior inferior calix was targeted bilaterally allowing placement of bilateral 10 French percutaneous nephrostomy catheters with ends coiled and locked within the bilateral renal pelvises. Contrast injection confirmed appropriate positioning. IMPRESSION: Successful ultrasound and fluoroscopic guided placement of bilateral 10 French PCNs. Electronically Signed   By: Sandi Mariscal M.D.   On: 05/23/2020 12:55   US RENAL  Result Date: 05/23/2020 CLINICAL DATA:  Acute kidney injury EXAM: RENAL / URINARY TRACT ULTRASOUND COMPLETE COMPARISON:  CT 05/22/2020 FINDINGS: Right Kidney: Renal measurements: 11.5 x 5.2 x 5.5 cm = volume: 170 mL. Mild renal cortical thinning with increased cortical echogenicity. Severe right hydronephrosis. The visualized right ureter is moderately dilated. No discrete shadowing stone or mass lesion identified. Left Kidney: Renal measurements: 12.5 x 5.2 x 4.7 cm = volume: 162 mL. Cortical thickness within normal limits. Increased renal cortical echogenicity. Moderate to severe hydronephrosis. Visualized left ureter is mildly dilated. No discrete shadowing stone or mass lesion identified. Bladder: Partially distended.  No definite abnormality. Other: Inferior right lower lobe solid intrahepatic mass measuring up to 6.0 cm. Irregular and heterogeneous appearance of the uterus is partially visualized on images within the pelvis, incompletely characterized. IMPRESSION: 1. Severe right hydroureteronephrosis. 2. Moderate to severe left hydronephrosis. 3. Increased renal cortical echogenicity bilaterally suggesting medical renal disease. 4. Very irregular appearance of visualized portion of the uterus noted on transabdominal imaging through the pelvis. Recommend dedicated pelvic ultrasound for further evaluation. 5. Solid 6.0 cm inferior right hepatic lobe mass most suggestive of metastatic  disease based on the previous CT. Electronically Signed   By: Davina Poke D.O.   On: 05/23/2020 10:04   US BIOPSY (LIVER)  Result Date: 05/26/2020 INDICATION: Rectal mass with associated liver masses. EXAM: ULTRASOUND GUIDED CORE BIOPSY OF LIVER MEDICATIONS: None. ANESTHESIA/SEDATION: Fentanyl 1.0 mcg IV; Versed 50 mg IV Moderate Sedation Time:  15 minutes. The patient was continuously monitored during the procedure by the interventional radiology nurse under my direct supervision. PROCEDURE: The procedure, risks, benefits, and alternatives were explained to the patient. Questions regarding the procedure were encouraged and answered. The patient understands and consents to the procedure. A time-out was performed prior to initiating the procedure. Ultrasound was performed to localize liver lesions. The abdominal wall was prepped with chlorhexidine in a sterile fashion, and a sterile drape was applied covering the operative field. A sterile gown and sterile gloves were used for the procedure. Local anesthesia was provided with 1% Lidocaine. Under ultrasound guidance, a 48 gauge trocar needle was advanced into the liver at the level of a lesion within the right lobe. Three separate coaxial 18 gauge core biopsy samples were obtained with an 18 gauge device. Material was submitted in formalin. Gel-Foam pledgets were advanced through the outer needle as it was retracted and removed. Additional ultrasound was performed. COMPLICATIONS: None  immediate. FINDINGS: The largest mass in the liver as noted by CT is within the inferior aspect of the right lobe. By ultrasound this lesion measures approximately 6.5 x 5.1 x 6.1 cm. Solid tissue was obtained. IMPRESSION: Ultrasound-guided core biopsy performed at the level of a 6.5 cm mass within the inferior aspect of the right lobe of the liver. Electronically Signed   By: Aletta Edouard M.D.   On: 05/26/2020 15:27   DG Chest Port 1 View  Result Date:  05/30/2020 CLINICAL DATA:  Follow-up pneumothorax EXAM: PORTABLE CHEST 1 VIEW COMPARISON:  Yesterday FINDINGS: Pigtail catheter over the left mid chest with no visible pneumothorax remaining. Small volume soft tissue gas at the left apex. Left-sided porta catheter with tip at the distal left brachiocephalic. Retrocardiac atelectasis. Artifact from EKG leads. IMPRESSION: No visible residual pneumothorax. Electronically Signed   By: Monte Fantasia M.D.   On: 05/30/2020 04:31   DG Chest Port 1 View  Result Date: 05/29/2020 CLINICAL DATA:  Follow-up pneumothorax EXAM: PORTABLE CHEST 1 VIEW COMPARISON:  Earlier same day FINDINGS: Left Port-A-Cath remains in place. Left pneumothorax is larger, increased from about 10% to about 25%. No evidence of tension or mediastinal shift. Mild atelectasis the left lung base. IMPRESSION: Enlarging left pneumothorax, 10% to about 25%. No evidence of tension or mediastinal shift. These results will be called to the ordering clinician or representative by the Radiologist Assistant, and communication documented in the PACS or [employer]. Electronically Signed   By: Nelson Chimes M.D.   On: 05/29/2020 19:20   DG Chest Port 1 View  Result Date: 05/29/2020 CLINICAL DATA:  54 year old female status post Port-A-Cath placement. EXAM: PORTABLE CHEST 1 VIEW COMPARISON:  Chest CT dated 05/27/2020. FINDINGS: Left-sided Port-A-Cath with tip over central SVC. There is a small left apical pneumothorax measuring approximately 2.4 cm in thickness. No focal consolidation, or pleural effusion. The cardiac silhouette is within limits. No acute osseous pathology. Small pockets of soft tissue air noted over the left clavicle. IMPRESSION: Status post Port-A-Cath placement.  Small left apical pneumothorax. These results were called by telephone at the time of interpretation on 05/29/2020 at 4:26 pm to nurse Maree Krabbe, who verbally acknowledged these results. Electronically Signed   By: Anner Crete M.D.   On: 05/29/2020 16:33   DG C-Arm 1-60 Min-No Report  Result Date: 05/29/2020 Fluoroscopy was utilized by the requesting physician.  No radiographic interpretation.   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 Request made for placement of bilateral percutaneous nephrostomy catheters for urinary diversion purposes. EXAM: 1. ULTRASOUND GUIDANCE FOR PUNCTURE OF THE BILATERAL RENAL COLLECTING SYSTEMS 2. BILATERAL PERCUTANEOUS NEPHROSTOMY TUBE PLACEMENT. COMPARISON:  CT abdomen and pelvis-05/22/2020 MEDICATIONS: Ancef 2 gm IV; The antibiotic was administered in an appropriate time frame prior to skin puncture. ANESTHESIA/SEDATION: Moderate (conscious) sedation was employed during this procedure. A total of Versed 4 mg and Fentanyl 100 mcg was administered intravenously. Moderate Sedation Time: 22 minutes. The patient's level of consciousness and vital signs were monitored continuously by radiology nursing throughout the procedure under my direct supervision. CONTRAST:  A total of 20 cc Visipaque 320 was administered into both renal collecting systems. FLUOROSCOPY TIME:  1 minute, 42 seconds (34.1 mGy) COMPLICATIONS: None immediate. PROCEDURE: The procedure, risks, benefits, and alternatives were explained to the patient. Questions regarding the procedure were encouraged and answered. The patient understands and consents to  the procedure. A timeout was performed prior to the initiation of the procedure. The bilateral flanks were prepped and draped in the usual sterile fashion and a sterile drape was applied covering the operative field. A sterile gown and sterile gloves were used for the procedure. Local anesthesia was provided with 1% Lidocaine with epinephrine. Attention was first paid towards placement of the left-sided percutaneous nephrostomy catheter.  Ultrasound was used to localize the left kidney. Under direct ultrasound guidance, a 20 gauge needle was advanced into the renal collecting system. An ultrasound image documentation was performed. Access within the collecting system was confirmed with the efflux of urine followed by limited contrast injection. Over a Nitrex wire, the tract was dilated with an Accustick stent. Next, under intermittent fluoroscopic guidance and over a short Amplatz wire, the track was dilated ultimately allowing placement of a 10-French percutaneous nephrostomy catheter which was advanced to the level of the renal pelvis where the coil was formed and locked. Contrast was injected and several spot fluoroscopic images were obtained in various obliquities. The catheter was secured at the skin with a Prolene retention suture and stat lock device and connected to a gravity bag was placed. The identical procedure was repeated for the contralateral right kidney allowing placement of a 10 French percutaneous nephrostomy catheter with end ultimately coiled and locked within the right renal pelvis. Dressings were applied. The patient tolerated procedure well without immediate postprocedural complication. 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 Under a combination of ultrasound and fluoroscopic guidance, a posterior inferior calix was targeted bilaterally allowing placement of bilateral 10 French percutaneous nephrostomy catheters with ends coiled and locked within the bilateral renal pelvises. Contrast injection confirmed appropriate positioning. IMPRESSION: Successful ultrasound and fluoroscopic guided placement of bilateral 10 French PCNs. Electronically Signed   By: Sandi Mariscal M.D.   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 Request made for placement of bilateral percutaneous nephrostomy catheters for urinary diversion purposes. EXAM: 1. ULTRASOUND GUIDANCE FOR PUNCTURE OF THE BILATERAL RENAL COLLECTING SYSTEMS 2. BILATERAL PERCUTANEOUS NEPHROSTOMY TUBE PLACEMENT. COMPARISON:  CT abdomen and pelvis-05/22/2020 MEDICATIONS: Ancef 2 gm IV; The antibiotic was administered in an appropriate time frame prior to skin puncture. ANESTHESIA/SEDATION: Moderate (conscious) sedation was employed during this procedure. A total of Versed 4 mg and Fentanyl 100 mcg was administered intravenously. Moderate Sedation Time: 22 minutes. The patient's level of consciousness and vital signs were monitored continuously by radiology nursing throughout the procedure under my direct supervision. CONTRAST:  A total of 20 cc Visipaque 320 was administered into both renal collecting systems. FLUOROSCOPY TIME:  1 minute, 42 seconds (16.1 mGy) COMPLICATIONS: None immediate. PROCEDURE: The procedure, risks, benefits, and alternatives were explained to the patient. Questions regarding the procedure were encouraged and answered. The patient understands and consents to the procedure. A timeout was performed prior to the initiation of the procedure. The bilateral flanks were prepped and draped in the usual sterile fashion and a sterile drape was applied covering the operative field. A sterile gown and sterile gloves were used for the procedure. Local anesthesia was provided with 1% Lidocaine with epinephrine. Attention was first paid towards placement of the left-sided percutaneous nephrostomy catheter. Ultrasound was used to localize the left kidney. Under direct ultrasound guidance, a  20 gauge needle was advanced into the renal collecting system. An ultrasound image documentation was performed. Access within the collecting system was confirmed with the efflux of urine  followed by limited contrast injection. Over a Nitrex wire, the tract was dilated with an Accustick stent. Next, under intermittent fluoroscopic guidance and over a short Amplatz wire, the track was dilated ultimately allowing placement of a 10-French percutaneous nephrostomy catheter which was advanced to the level of the renal pelvis where the coil was formed and locked. Contrast was injected and several spot fluoroscopic images were obtained in various obliquities. The catheter was secured at the skin with a Prolene retention suture and stat lock device and connected to a gravity bag was placed. The identical procedure was repeated for the contralateral right kidney allowing placement of a 10 French percutaneous nephrostomy catheter with end ultimately coiled and locked within the right renal pelvis. Dressings were applied. The patient tolerated procedure well without immediate postprocedural complication. 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 Under a combination of ultrasound and fluoroscopic guidance, a posterior inferior calix was targeted bilaterally allowing placement of bilateral 10 French percutaneous nephrostomy catheters with ends coiled and locked within the bilateral renal pelvises. Contrast injection confirmed appropriate positioning. IMPRESSION: Successful ultrasound and fluoroscopic guided placement of bilateral 10 French PCNs. Electronically Signed   By: Sandi Mariscal M.D.   On: 05/23/2020 12:55    PERFORMANCE STATUS (ECOG) : 1 - Symptomatic but completely ambulatory  Review of Systems Unless otherwise noted, a complete review of systems is negative.  Physical Exam General: NAD Cardiovascular: regular rate and rhythm Pulmonary: clear ant fields Abdomen: soft, nontender, + bowel sounds GU: no suprapubic tenderness Extremities: no edema, no  joint deformities Skin: no rashes Neurological: nonfocal  IMPRESSION: Post hospital follow-up visit.  Patient reports that she is doing reasonably well.  She denies any significant changes or concerns.  She does endorse chronic diarrhea but says that it is very small amounts.  She also says she is not eating or drinking much due to limited appetite.  We talked about nutritional needs through chemotherapy.  I recommended high-protein and high-calorie foods.  Patient was provided with oral supplement samples today and encouraged to drink 2-3 times daily.  We will send a referral to nutrition.  Patient is starting FOLFOX and Avastin today.  She says that she has started talking with her daughter about her end-of-life wishes.  I provided her with a MOST form today to take home and review with her family.  Patient says that she does not think that she wants to be resuscitated nor have her life prolonged artificially machines.  She says she has strong faith and wants to be at peace when her body fails.  She says that she explained this to her daughter but that her daughter struggled with this decision.  PLAN: -Continue current scope of treatment -Referral to nutrition -Oral supplements 3 times daily -Consider appetite stimulant if needed -MOST Form reviewed -RTC 1 to 2 weeks   Patient expressed understanding and was in agreement with this plan. She also understands that She can call the clinic at any time with any questions, concerns, or complaints.     Time Total: 30 minutes  Visit consisted of counseling and education dealing with the complex and emotionally intense issues of symptom management and palliative care in the setting of serious and potentially  life-threatening illness.Greater than 50%  of this time was spent counseling and coordinating care related to the above assessment and plan.  Signed by: Altha Harm, PhD, NP-C

## 2020-06-05 ENCOUNTER — Other Ambulatory Visit: Payer: Self-pay

## 2020-06-06 ENCOUNTER — Inpatient Hospital Stay: Payer: Medicaid Other

## 2020-06-06 VITALS — BP 99/68 | HR 92 | Temp 97.0°F | Resp 20

## 2020-06-06 DIAGNOSIS — C19 Malignant neoplasm of rectosigmoid junction: Secondary | ICD-10-CM

## 2020-06-06 MED ORDER — HEPARIN SOD (PORK) LOCK FLUSH 100 UNIT/ML IV SOLN
500.0000 [IU] | Freq: Once | INTRAVENOUS | Status: AC | PRN
  Administered 2020-06-06: 500 [IU]
  Filled 2020-06-06: qty 5

## 2020-06-06 MED ORDER — HEPARIN SOD (PORK) LOCK FLUSH 100 UNIT/ML IV SOLN
INTRAVENOUS | Status: AC
  Filled 2020-06-06: qty 5

## 2020-06-06 NOTE — Progress Notes (Signed)
Pt here for pump d/c, VSS, port deaccessed, d/ced home without problems

## 2020-06-06 NOTE — Progress Notes (Addendum)
Tumor Board Documentation  Anita Sullivan was presented by Dr Grayland Ormond at our Tumor Board on 06/05/2020, which included representatives from medical oncology, radiation oncology, internal medicine, navigation, pathology, radiology, surgical, pharmacy, research, palliative care, pulmonology.  Anita Sullivan currently presents as a new patient, for Anita Sullivan, for new positive pathology with history of the following treatments: surgical intervention(s).  Additionally, we reviewed previous medical and familial history, history of present illness, and recent lab results along with all available histopathologic and imaging studies. The tumor board considered available treatment options and made the following recommendations: Chemotherapy.    The following procedures/referrals were also placed: No orders of the defined types were placed in this encounter.   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  Tumor board is a meeting of clinicians from various specialty areas who evaluate and discuss patients for whom a multidisciplinary approach is being considered. Final determinations in the plan of care are those of the provider(s). The responsibility for follow up of recommendations given during tumor board is that of the provider.   Todays extended care, comprehensive team conference, Anita Sullivan was not present for the discussion and was not examined.   Multidisciplinary Tumor Board is a multidisciplinary case peer review process.  Decisions discussed in the Multidisciplinary Tumor Board reflect the opinions of the specialists present at the conference without having examined the patient.  Ultimately, treatment and diagnostic decisions rest with the primary provider(s) and  the patient.

## 2020-06-08 NOTE — Progress Notes (Signed)
[name]  [phone]) [phone] [phone]) [phone]  ID: Anita Sullivan OB: 1965/11/06  MR#: [id number]  IRJ#:[id number]  Patient Care Team: Volney American, PA-C as PCP - General (Family Medicine) [name], MD as Consulting Physician (Oncology) [name], RN as Oncology Nurse Navigator  CHIEF COMPLAINT: Metastatic colorectal adenocarcinoma.  INTERVAL HISTORY: Patient returns to clinic today for further evaluation and to assess her toleration of cycle 1 of FOLFOX.  She had some mild nausea, but otherwise tolerated her treatment well.  She denies any further rectal bleeding.  She continues to have chronic weakness and fatigue, but this is improving as well.  She has no neurologic complaints.  She denies any recent fevers.  She has a fair appetite, but denies weight loss.  She denies any chest pain, shortness of breath, cough, or hemoptysis.  She has some mild nausea, but denies any vomiting, constipation, or diarrhea.  She has no urinary complaints.  Patient offers no further specific complaints today.  REVIEW OF SYSTEMS:   Review of Systems  Constitutional: Positive for malaise/fatigue. Negative for fever and weight loss.  Respiratory: Negative.  Negative for cough, hemoptysis and shortness of breath.   Cardiovascular: Negative.  Negative for chest pain and leg swelling.  Gastrointestinal: Positive for nausea. Negative for blood in stool, constipation and vomiting.  Genitourinary: Negative.  Negative for hematuria.  Musculoskeletal: Negative.  Negative for back pain.  Skin: Negative.  Negative for rash.  Neurological: Positive for weakness. Negative for dizziness, seizures and headaches.  Psychiatric/Behavioral: Negative.  The patient is not nervous/anxious.     As per HPI. Otherwise, a complete review of systems is negative.  PAST MEDICAL HISTORY: Past Medical History:  Diagnosis Date  . Colon cancer ([place])   . Family history of  pancreatic cancer   . Family history of uterine cancer     PAST SURGICAL HISTORY: Past Surgical History:  Procedure Laterality Date  . COLONOSCOPY WITH PROPOFOL N/A 05/27/2020   Procedure: COLONOSCOPY WITH PROPOFOL;  Surgeon: [name], MD;  Location: [facility] ENDOSCOPY;  Service: Gastroenterology;  Laterality: N/A;  . FLEXIBLE SIGMOIDOSCOPY N/A 05/28/2020   Procedure: FLEXIBLE SIGMOIDOSCOPY;  Surgeon: [name], MD;  Location: [facility] ENDOSCOPY;  Service: Gastroenterology;  Laterality: N/A;  . IR NEPHROSTOMY PLACEMENT LEFT  05/23/2020  . IR NEPHROSTOMY PLACEMENT RIGHT  05/23/2020  . PORTACATH PLACEMENT Left 05/29/2020   Procedure: INSERTION PORT-A-CATH;  Surgeon: [name], MD;  Location: [facility] ORS;  Service: General;  Laterality: Left;    FAMILY HISTORY: Family History  Problem Relation Age of Onset  . Other Mother        Corticobasal degeneration  . Uterine cancer Mother   . Diabetes Maternal Grandmother   . Stroke Maternal Grandfather   . Stroke Paternal Grandfather   . Heart disease Father   . Pancreatic cancer Maternal Uncle 70  . Cancer Paternal Aunt        unk type  . Liver cancer Maternal Uncle   . Pancreatic cancer Cousin   . Cancer Cousin        unk type    ADVANCED DIRECTIVES (Y/N):  N  HEALTH MAINTENANCE: Social History   Tobacco Use  . Smoking status: Never Smoker  . Smokeless tobacco: Never Used  Vaping Use  . Vaping Use: Never used  Substance Use Topics  . Alcohol use: Never  . Drug use: Never     Colonoscopy:  PAP:  Bone density:  Lipid panel:  No Known Allergies  Current Outpatient Medications  Medication Sig Dispense Refill  . lidocaine-prilocaine (EMLA) cream Apply to affected area once 30 g 3  . ondansetron (ZOFRAN) 8 MG tablet Take 1 tablet (8 mg total) by mouth 2 (two) times daily as needed for refractory nausea / vomiting. 60 tablet 2  . pantoprazole (PROTONIX) 40 MG tablet Take 1 tablet (40 mg total) by mouth 2  (two) times daily. 60 tablet 0  . prochlorperazine (COMPAZINE) 10 MG tablet Take 1 tablet (10 mg total) by mouth every 6 (six) hours as needed (Nausea or vomiting). 60 tablet 2  . acetaminophen (TYLENOL) 500 MG tablet Take 1,000 mg by mouth every 6 (six) hours as needed.  (Patient not taking: Reported on 06/04/2020)    . oxyCODONE (OXY IR/ROXICODONE) 5 MG immediate release tablet Take 1 tablet (5 mg total) by mouth every 4 (four) hours as needed for moderate pain or severe pain. (Patient not taking: Reported on 06/11/2020) 14 tablet 0   No current facility-administered medications for this visit.    OBJECTIVE: Vitals:   06/11/20 1015  BP: 108/73  Pulse: 98  Resp: 18  Temp: 98.6 F (37 C)  SpO2: 100%     Body mass index is 25.24 kg/m.    ECOG FS:1 - Symptomatic but completely ambulatory  General: Well-developed, well-nourished, no acute distress. Eyes: Pink conjunctiva, anicteric sclera. HEENT: Normocephalic, moist mucous membranes. Lungs: No audible wheezing or coughing. Heart: Regular rate and rhythm. Abdomen: Soft, nontender, no obvious distention. Musculoskeletal: No edema, cyanosis, or clubbing.  Nephrostomy tubes in place. Neuro: Alert, answering all questions appropriately. Cranial nerves grossly intact. Skin: No rashes or petechiae noted. Psych: Normal affect.   LAB RESULTS:  Lab Results  Component Value Date   NA 136 06/11/2020   K 3.9 06/11/2020   CL 99 06/11/2020   CO2 25 06/11/2020   GLUCOSE 107 (H) 06/11/2020   BUN 14 06/11/2020   CREATININE 1.45 (H) 06/11/2020   CALCIUM 8.6 (L) 06/11/2020   PROT 7.9 06/11/2020   ALBUMIN 3.3 (L) 06/11/2020   AST 19 06/11/2020   ALT 8 06/11/2020   ALKPHOS 52 06/11/2020   BILITOT 0.5 06/11/2020   GFRNONAA 43 (L) 06/11/2020    Lab Results  Component Value Date   WBC 6.4 06/11/2020   NEUTROABS 4.9 06/11/2020   HGB 9.4 (L) 06/11/2020   HCT 29.5 (L) 06/11/2020   MCV 83.8 06/11/2020   PLT 304 06/11/2020      STUDIES: CT ABDOMEN PELVIS WO CONTRAST  Result Date: 05/22/2020 CLINICAL DATA:  Intermittent rectal bleeding. EXAM: CT ABDOMEN AND PELVIS WITHOUT CONTRAST TECHNIQUE: Multidetector CT imaging of the abdomen and pelvis was performed following the standard protocol without IV contrast. COMPARISON:  None. FINDINGS: Lower chest: No acute abnormality. Hepatobiliary: A 2.9 cm x 3.1 cm ill-defined area of heterogeneous low attenuation is seen within the lateral aspect of the liver dome (axial CT image 17, CT series number 2). An ill-defined, slightly exophytic 6.9 cm x 5.0 cm area of heterogeneous mildly decreased attenuation is seen within the posterior aspect of the right lobe of the liver. An ill-defined central area of low attenuation is noted within this region. No gallstones, gallbladder wall thickening, or biliary dilatation. Pancreas: Unremarkable. No pancreatic ductal dilatation or surrounding inflammatory changes. Spleen: Normal in size without focal abnormality. Adrenals/Urinary Tract: Adrenal glands are unremarkable. Kidneys are normal in size, without focal lesions. Marked severity bilateral hydronephrosis and hydroureter is seen. The urinary bladder is partially  contracted and subsequently limited in evaluation. Stomach/Bowel: Stomach is within normal limits. Appendix appears normal. No evidence of bowel dilatation. There is marked severity thickening of the mid and distal sigmoid colon associated pericolonic inflammatory fat stranding is noted. Vascular/Lymphatic: No significant vascular findings are present. There is moderate severity para-aortic and aortocaval lymphadenopathy. The largest lymph node measures approximately 1.7 cm x 1.7 cm along the para-aortic region at the level of the kidney. It should be noted that this area is limited in evaluation in the absence of intravenous contrast. A 2.8 cm x 2.4 cm heterogeneous soft tissue mass is seen along the posterior aspect of the distal left  iliac vessels (axial CT images 68 through 74, CT series number 2). Numerous subcentimeter soft tissue nodules are seen within the mesentery of the pelvis, along the midline (axial CT images 58 through 66, CT series number 2). Numerous additional small soft tissue nodules are seen surrounding the distal sigmoid colon and rectosigmoid junction. Reproductive: The uterus is mildly enlarged. The uterine fundus is positioned to the left of midline within the anterior aspect of the pelvis. Other: No abdominal wall hernia or abnormality. A small amount of abdominal and pelvic fluid is noted. Musculoskeletal: No acute or significant osseous findings. IMPRESSION: 1. Marked severity sigmoid colitis. Due to the numerous areas of soft tissue nodularity within the mesentery in this region, an underlying neoplastic process cannot be excluded. Correlation with colonoscopy is recommended. 2. Heterogeneous liver masses, as described above, suspicious for an underlying neoplasm. MRI correlation is recommended. 3. Moderate severity para-aortic and aortocaval lymphadenopathy with an additional enlarged, irregular appearing lymph node seen along the posterior aspect of the distal left iliac vessels. This is also worrisome for sequelae associated with an underlying neoplasm. 4. Marked severity bilateral hydronephrosis and hydroureter which likely represents partial renal obstruction secondary to the inflammatory and suspected neoplastic process seen within the pelvis. Electronically Signed   By: Virgina Norfolk M.D.   On: 05/22/2020 20:26   DG Chest 1 View  Result Date: 05/31/2020 CLINICAL DATA:  Follow-up left pneumothorax. EXAM: CHEST  1 VIEW COMPARISON:  May 31, 2020 FINDINGS: Left-sided injectable port in stable position. Left chest tube in stable position. Cardiomediastinal silhouette is normal. Mediastinal contours appear intact. There is no evidence of focal airspace consolidation, pleural effusion. No radiographically  apparent pneumothorax. Osseous structures are without acute abnormality. Soft tissues are grossly normal. IMPRESSION: 1. No radiographically apparent pneumothorax. 2. Left chest tube in stable position. Electronically Signed   By: Fidela Salisbury M.D.   On: 05/31/2020 13:31   DG Chest 1 View  Result Date: 05/31/2020 CLINICAL DATA:  Follow-up left pneumothorax. EXAM: CHEST  1 VIEW COMPARISON:  05/30/2020 and earlier studies. FINDINGS: Left-sided chest tube is stable, curled tip projecting over the lateral left hilum. No pneumothorax. Mild atelectasis extending laterally from the left hilum and along the medial left lung base, unchanged. Lungs otherwise clear. No pleural effusion. Small amount of subcutaneous air at the left neck base, stable. IMPRESSION: 1. Stable left-sided chest tube with no visible pneumothorax. 2. No acute findings. Mild stable atelectasis on the left as described. Electronically Signed   By: Lajean Manes M.D.   On: 05/31/2020 06:54   CT CHEST WO CONTRAST  Result Date: 05/27/2020 CLINICAL DATA:  New diagnosis of rectal cancer. EXAM: CT CHEST WITHOUT CONTRAST TECHNIQUE: Multidetector CT imaging of the chest was performed following the standard protocol without IV contrast. COMPARISON:  Abdominal CT 05/22/2020 FINDINGS: Cardiovascular: Normal aortic caliber.  Normal heart size, without pericardial effusion. Mediastinum/Nodes: No supraclavicular adenopathy. No mediastinal or definite hilar adenopathy, given limitations of unenhanced CT. Lungs/Pleura: No pleural fluid. Left base scarring or subsegmental atelectasis. Nonspecific 2 mm right upper lobe pulmonary nodule on 29/3. Calcified granuloma of 2 mm in the right lower lobe on 85/3. Upper Abdomen: Subtle hypoattenuating hepatic dome lesion again identified. The dominant right hepatic lobe mass is excluded. Normal imaged portions of the spleen, stomach, pancreas, adrenal glands, right kidney. Musculoskeletal: Presumed bone island  within the sternal body. IMPRESSION: 1.  No acute process or evidence of metastatic disease in the chest. 2. Nonspecific tiny right upper lobe pulmonary nodule. No thoracic adenopathy. 3. Liver masses, as before. Electronically Signed   By: Abigail Miyamoto M.D.   On: 05/27/2020 19:34   [place] Intraoperative  Result Date: 05/23/2020 INDICATION: Patient admitted with new diagnosis of malignancy of unknown etiology now with bilateral obstructive hydronephrosis. Request made for placement of bilateral percutaneous nephrostomy catheters for urinary diversion purposes. EXAM: 1. ULTRASOUND GUIDANCE FOR PUNCTURE OF THE BILATERAL RENAL COLLECTING SYSTEMS 2. BILATERAL PERCUTANEOUS NEPHROSTOMY TUBE PLACEMENT. COMPARISON:  CT abdomen and pelvis-05/22/2020 MEDICATIONS: Ancef 2 gm IV; The antibiotic was administered in an appropriate time frame prior to skin puncture. ANESTHESIA/SEDATION: Moderate (conscious) sedation was employed during this procedure. A total of Versed 4 mg and Fentanyl 100 mcg was administered intravenously. Moderate Sedation Time: 22 minutes. The patient's level of consciousness and vital signs were monitored continuously by radiology nursing throughout the procedure under my direct supervision. CONTRAST:  A total of 20 cc Visipaque 320 was administered into both renal collecting systems. FLUOROSCOPY TIME:  1 minute, 42 seconds (80.9 mGy) COMPLICATIONS: None immediate. PROCEDURE: The procedure, risks, benefits, and alternatives were explained to the patient. Questions regarding the procedure were encouraged and answered. The patient understands and consents to the procedure. A timeout was performed prior to the initiation of the procedure. The bilateral flanks were prepped and draped in the usual sterile fashion and a sterile drape was applied covering the operative field. A sterile gown and sterile gloves were used for the procedure. Local anesthesia was provided with 1% Lidocaine with epinephrine. Attention  was first paid towards placement of the left-sided percutaneous nephrostomy catheter. Ultrasound was used to localize the left kidney. Under direct ultrasound guidance, a 20 gauge needle was advanced into the renal collecting system. An ultrasound image documentation was performed. Access within the collecting system was confirmed with the efflux of urine followed by limited contrast injection. Over a Nitrex wire, the tract was dilated with an Accustick stent. Next, under intermittent fluoroscopic guidance and over a short Amplatz wire, the track was dilated ultimately allowing placement of a 10-French percutaneous nephrostomy catheter which was advanced to the level of the renal pelvis where the coil was formed and locked. Contrast was injected and several spot fluoroscopic images were obtained in various obliquities. The catheter was secured at the skin with a Prolene retention suture and stat lock device and connected to a gravity bag was placed. The identical procedure was repeated for the contralateral right kidney allowing placement of a 10 French percutaneous nephrostomy catheter with end ultimately coiled and locked within the right renal pelvis. Dressings were applied. The patient tolerated procedure well without immediate postprocedural complication. 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 Under a combination of ultrasound and fluoroscopic guidance, a posterior inferior calix was  targeted bilaterally allowing placement of bilateral 10 French percutaneous nephrostomy catheters with ends coiled and locked within the bilateral renal pelvises. Contrast injection confirmed appropriate positioning. IMPRESSION: Successful ultrasound and fluoroscopic guided placement of bilateral 10 French PCNs. Electronically Signed   By: Sandi Mariscal M.D.   On: 05/23/2020 12:55   US  RENAL  Result Date: 05/23/2020 CLINICAL DATA:  Acute kidney injury EXAM: RENAL / URINARY TRACT ULTRASOUND COMPLETE COMPARISON:  CT 05/22/2020 FINDINGS: Right Kidney: Renal measurements: 11.5 x 5.2 x 5.5 cm = volume: 170 mL. Mild renal cortical thinning with increased cortical echogenicity. Severe right hydronephrosis. The visualized right ureter is moderately dilated. No discrete shadowing stone or mass lesion identified. Left Kidney: Renal measurements: 12.5 x 5.2 x 4.7 cm = volume: 162 mL. Cortical thickness within normal limits. Increased renal cortical echogenicity. Moderate to severe hydronephrosis. Visualized left ureter is mildly dilated. No discrete shadowing stone or mass lesion identified. Bladder: Partially distended.  No definite abnormality. Other: Inferior right lower lobe solid intrahepatic mass measuring up to 6.0 cm. Irregular and heterogeneous appearance of the uterus is partially visualized on images within the pelvis, incompletely characterized. IMPRESSION: 1. Severe right hydroureteronephrosis. 2. Moderate to severe left hydronephrosis. 3. Increased renal cortical echogenicity bilaterally suggesting medical renal disease. 4. Very irregular appearance of visualized portion of the uterus noted on transabdominal imaging through the pelvis. Recommend dedicated pelvic ultrasound for further evaluation. 5. Solid 6.0 cm inferior right hepatic lobe mass most suggestive of metastatic disease based on the previous CT. Electronically Signed   By: Davina Poke D.O.   On: 05/23/2020 10:04   US BIOPSY (LIVER)  Result Date: 05/26/2020 INDICATION: Rectal mass with associated liver masses. EXAM: ULTRASOUND GUIDED CORE BIOPSY OF LIVER MEDICATIONS: None. ANESTHESIA/SEDATION: Fentanyl 1.0 mcg IV; Versed 50 mg IV Moderate Sedation Time:  15 minutes. The patient was continuously monitored during the procedure by the interventional radiology nurse under my direct supervision. PROCEDURE: The procedure,  risks, benefits, and alternatives were explained to the patient. Questions regarding the procedure were encouraged and answered. The patient understands and consents to the procedure. A time-out was performed prior to initiating the procedure. Ultrasound was performed to localize liver lesions. The abdominal wall was prepped with chlorhexidine in a sterile fashion, and a sterile drape was applied covering the operative field. A sterile gown and sterile gloves were used for the procedure. Local anesthesia was provided with 1% Lidocaine. Under ultrasound guidance, a 64 gauge trocar needle was advanced into the liver at the level of a lesion within the right lobe. Three separate coaxial 18 gauge core biopsy samples were obtained with an 18 gauge device. Material was submitted in formalin. Gel-Foam pledgets were advanced through the outer needle as it was retracted and removed. Additional ultrasound was performed. COMPLICATIONS: None immediate. FINDINGS: The largest mass in the liver as noted by CT is within the inferior aspect of the right lobe. By ultrasound this lesion measures approximately 6.5 x 5.1 x 6.1 cm. Solid tissue was obtained. IMPRESSION: Ultrasound-guided core biopsy performed at the level of a 6.5 cm mass within the inferior aspect of the right lobe of the liver. Electronically Signed   By: Aletta Edouard M.D.   On: 05/26/2020 15:27   DG Chest Port 1 View  Result Date: 05/30/2020 CLINICAL DATA:  Follow-up pneumothorax EXAM: PORTABLE CHEST 1 VIEW COMPARISON:  Yesterday FINDINGS: Pigtail catheter over the left mid chest with no visible pneumothorax remaining. Small volume soft tissue gas at the left apex.  Left-sided porta catheter with tip at the distal left brachiocephalic. Retrocardiac atelectasis. Artifact from EKG leads. IMPRESSION: No visible residual pneumothorax. Electronically Signed   By: Monte Fantasia M.D.   On: 05/30/2020 04:31   DG Chest Port 1 View  Result Date:  05/29/2020 CLINICAL DATA:  Follow-up pneumothorax EXAM: PORTABLE CHEST 1 VIEW COMPARISON:  Earlier same day FINDINGS: Left Port-A-Cath remains in place. Left pneumothorax is larger, increased from about 10% to about 25%. No evidence of tension or mediastinal shift. Mild atelectasis the left lung base. IMPRESSION: Enlarging left pneumothorax, 10% to about 25%. No evidence of tension or mediastinal shift. These results will be called to the ordering clinician or representative by the Radiologist Assistant, and communication documented in the PACS or [employer]. Electronically Signed   By: Nelson Chimes M.D.   On: 05/29/2020 19:20   DG Chest Port 1 View  Result Date: 05/29/2020 CLINICAL DATA:  54 year old female status post Port-A-Cath placement. EXAM: PORTABLE CHEST 1 VIEW COMPARISON:  Chest CT dated 05/27/2020. FINDINGS: Left-sided Port-A-Cath with tip over central SVC. There is a small left apical pneumothorax measuring approximately 2.4 cm in thickness. No focal consolidation, or pleural effusion. The cardiac silhouette is within limits. No acute osseous pathology. Small pockets of soft tissue air noted over the left clavicle. IMPRESSION: Status post Port-A-Cath placement.  Small left apical pneumothorax. These results were called by telephone at the time of interpretation on 05/29/2020 at 4:26 pm to nurse Maree Krabbe, who verbally acknowledged these results. Electronically Signed   By: Anner Crete M.D.   On: 05/29/2020 16:33   DG C-Arm 1-60 Min-No Report  Result Date: 05/29/2020 Fluoroscopy was utilized by the requesting physician.  No radiographic interpretation.   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 Request made for placement of bilateral percutaneous nephrostomy catheters for urinary diversion purposes. EXAM: 1. ULTRASOUND GUIDANCE FOR  PUNCTURE OF THE BILATERAL RENAL COLLECTING SYSTEMS 2. BILATERAL PERCUTANEOUS NEPHROSTOMY TUBE PLACEMENT. COMPARISON:  CT abdomen and pelvis-05/22/2020 MEDICATIONS: Ancef 2 gm IV; The antibiotic was administered in an appropriate time frame prior to skin puncture. ANESTHESIA/SEDATION: Moderate (conscious) sedation was employed during this procedure. A total of Versed 4 mg and Fentanyl 100 mcg was administered intravenously. Moderate Sedation Time: 22 minutes. The patient's level of consciousness and vital signs were monitored continuously by radiology nursing throughout the procedure under my direct supervision. CONTRAST:  A total of 20 cc Visipaque 320 was administered into both renal collecting systems. FLUOROSCOPY TIME:  1 minute, 42 seconds (76.1 mGy) COMPLICATIONS: None immediate. PROCEDURE: The procedure, risks, benefits, and alternatives were explained to the patient. Questions regarding the procedure were encouraged and answered. The patient understands and consents to the procedure. A timeout was performed prior to the initiation of the procedure. The bilateral flanks were prepped and draped in the usual sterile fashion and a sterile drape was applied covering the operative field. A sterile gown and sterile gloves were used for the procedure. Local anesthesia was provided with 1% Lidocaine with epinephrine. Attention was first paid towards placement of the left-sided percutaneous nephrostomy catheter. Ultrasound was used to localize the left kidney. Under direct ultrasound guidance, a 20 gauge needle was advanced into the renal collecting system. An ultrasound image documentation was performed. Access within the collecting system was confirmed with the efflux of urine followed by limited contrast injection. Over a Nitrex wire, the tract was dilated  with an Accustick stent. Next, under intermittent fluoroscopic guidance and over a short Amplatz wire, the track was dilated ultimately allowing placement of a  10-French percutaneous nephrostomy catheter which was advanced to the level of the renal pelvis where the coil was formed and locked. Contrast was injected and several spot fluoroscopic images were obtained in various obliquities. The catheter was secured at the skin with a Prolene retention suture and stat lock device and connected to a gravity bag was placed. The identical procedure was repeated for the contralateral right kidney allowing placement of a 10 French percutaneous nephrostomy catheter with end ultimately coiled and locked within the right renal pelvis. Dressings were applied. The patient tolerated procedure well without immediate postprocedural complication. 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 Under a combination of ultrasound and fluoroscopic guidance, a posterior inferior calix was targeted bilaterally allowing placement of bilateral 10 French percutaneous nephrostomy catheters with ends coiled and locked within the bilateral renal pelvises. Contrast injection confirmed appropriate positioning. IMPRESSION: Successful ultrasound and fluoroscopic guided placement of bilateral 10 French PCNs. Electronically Signed   By: Sandi Mariscal M.D.   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 Request made for placement of bilateral percutaneous nephrostomy catheters for urinary diversion purposes. EXAM: 1. ULTRASOUND GUIDANCE FOR PUNCTURE OF THE BILATERAL RENAL COLLECTING SYSTEMS 2. BILATERAL PERCUTANEOUS NEPHROSTOMY TUBE PLACEMENT. COMPARISON:  CT abdomen and pelvis-05/22/2020 MEDICATIONS: Ancef 2 gm IV; The antibiotic was administered in an appropriate time frame prior to skin puncture.  ANESTHESIA/SEDATION: Moderate (conscious) sedation was employed during this procedure. A total of Versed 4 mg and Fentanyl 100 mcg was administered intravenously. Moderate Sedation Time: 22 minutes. The patient's level of consciousness and vital signs were monitored continuously by radiology nursing throughout the procedure under my direct supervision. CONTRAST:  A total of 20 cc Visipaque 320 was administered into both renal collecting systems. FLUOROSCOPY TIME:  1 minute, 42 seconds (92.1 mGy) COMPLICATIONS: None immediate. PROCEDURE: The procedure, risks, benefits, and alternatives were explained to the patient. Questions regarding the procedure were encouraged and answered. The patient understands and consents to the procedure. A timeout was performed prior to the initiation of the procedure. The bilateral flanks were prepped and draped in the usual sterile fashion and a sterile drape was applied covering the operative field. A sterile gown and sterile gloves were used for the procedure. Local anesthesia was provided with 1% Lidocaine with epinephrine. Attention was first paid towards placement of the left-sided percutaneous nephrostomy catheter. Ultrasound was used to localize the left kidney. Under direct ultrasound guidance, a 20 gauge needle was advanced into the renal collecting system. An ultrasound image documentation was performed. Access within the collecting system was confirmed with the efflux of urine followed by limited contrast injection. Over a Nitrex wire, the tract was dilated with an Accustick stent. Next, under intermittent fluoroscopic guidance and over a short Amplatz wire, the track was dilated ultimately allowing placement of a 10-French percutaneous nephrostomy catheter which was advanced to the level of the renal pelvis where the coil was formed and locked. Contrast was injected and several spot fluoroscopic images were obtained in various obliquities. The catheter was secured at the  skin with a Prolene retention suture and stat lock device and connected to a gravity  bag was placed. The identical procedure was repeated for the contralateral right kidney allowing placement of a 10 French percutaneous nephrostomy catheter with end ultimately coiled and locked within the right renal pelvis. Dressings were applied. The patient tolerated procedure well without immediate postprocedural complication. 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 Under a combination of ultrasound and fluoroscopic guidance, a posterior inferior calix was targeted bilaterally allowing placement of bilateral 10 French percutaneous nephrostomy catheters with ends coiled and locked within the bilateral renal pelvises. Contrast injection confirmed appropriate positioning. IMPRESSION: Successful ultrasound and fluoroscopic guided placement of bilateral 10 French PCNs. Electronically Signed   By: Sandi Mariscal M.D.   On: 05/23/2020 12:55   CT IMAGE GUIDED DRAINAGE BY PERCUTANEOUS CATHETER  Result Date: 06/09/2020 CLINICAL DATA:  Pneumothorax after port placement EXAM: CT GUIDED PLEURAL CATHETER PLACEMENT ANESTHESIA/SEDATION: Intravenous Fentanyl 51mcg and Versed 1mg  were administered as conscious sedation during continuous monitoring of the patient's level of consciousness and physiological / cardiorespiratory status by the radiology RN, with a total moderate sedation time of 34 minutes. PROCEDURE: The procedure, risks, benefits, and alternatives were explained to the patient. Questions regarding the procedure were encouraged and answered. The patient understands and consents to the procedure. Select axial scans through the thorax were obtained and an appropriate skin entry site was determined. The operative field was prepped with chlorhexidinein a sterile fashion, and a sterile drape  was applied covering the operative field. A sterile gown and sterile gloves were used for the procedure. Local anesthesia was provided with 1% Lidocaine. 18 gauge trocar needle advanced into the left pleural space. Air could be aspirated. Amplatz wire advanced easily. Tract dilated to facilitate placement of a 14 French pigtail catheter, directed towards the apex. Catheter was placed to -20 cm H2O Pleur-Evac suction. CT confirms good catheter position and evacuation of much of the pneumothorax. The patient tolerated the procedure well. COMPLICATIONS: None immediate FINDINGS: Large left pneumothorax was identified. 34 French chest tube placed as above. IMPRESSION: Technically successful CT-guided left chest tube placement. Electronically Signed   By: Lucrezia Europe M.D.   On: 06/09/2020 07:28    ASSESSMENT: Metastatic colorectal adenocarcinoma.  PLAN:   1. Metastatic colorectal adenocarcinoma: Although unclear whether malignancy originated in colon or rectum, given the pattern of spread to liver, will treat as a stage IV colon cancer.  CEA is only mildly elevated at 9.9.  Patient has had port placement.  Plan to treat with FOLFOX plus Avastin every 2 weeks.  Continue to hold Avastin given recent GI bleed.  Patient tolerated cycle 1 of FOLFOX without significant side effects.  Return to clinic in 1 week for further evaluation and consideration of cycle 2.  Appreciate palliative care and genetics input.  2.  Anemia: Hemoglobin has trended down to 9.4, monitor.  Continue to hold Avastin as above. 3.  Renal insufficiency: Improving.  Patient's creatinine is 1.45.  Will consider referral back to IR for removal of nephrostomy tubes in the near future.   Patient expressed understanding and was in agreement with this plan. She also understands that She can call clinic at any time with any questions, concerns, or complaints.   Cancer Staging Metastatic colorectal cancer [facility]) Staging form: Colon and Rectum, AJCC  8th Edition - Clinical stage from 05/31/2020: Stage IVC (cTX, cNX, pM1c) - Signed by [name], MD on 05/31/2020   Kathlene November  Grayland Ormond, MD   06/12/2020 6:47 AM

## 2020-06-09 ENCOUNTER — Telehealth: Payer: Self-pay

## 2020-06-09 NOTE — Telephone Encounter (Signed)
Telephone call to patient for follow up after receiving first infusion.   Patient states infusion went great.  States eating good and drinking plenty of fluids.   Denies any nausea or vomiting.  Encouraged patient to call for any concerns or questions. 

## 2020-06-10 ENCOUNTER — Encounter: Payer: Self-pay | Admitting: Oncology

## 2020-06-11 ENCOUNTER — Other Ambulatory Visit: Payer: Self-pay

## 2020-06-11 ENCOUNTER — Inpatient Hospital Stay (HOSPITAL_BASED_OUTPATIENT_CLINIC_OR_DEPARTMENT_OTHER): Payer: Medicaid Other | Admitting: Licensed Clinical Social Worker

## 2020-06-11 ENCOUNTER — Inpatient Hospital Stay: Payer: Medicaid Other

## 2020-06-11 ENCOUNTER — Encounter: Payer: Self-pay | Admitting: Oncology

## 2020-06-11 ENCOUNTER — Inpatient Hospital Stay (HOSPITAL_BASED_OUTPATIENT_CLINIC_OR_DEPARTMENT_OTHER): Payer: Medicaid Other | Admitting: Oncology

## 2020-06-11 ENCOUNTER — Encounter: Payer: Self-pay | Admitting: Licensed Clinical Social Worker

## 2020-06-11 ENCOUNTER — Encounter: Payer: Self-pay | Admitting: *Deleted

## 2020-06-11 VITALS — BP 108/73 | HR 98 | Temp 98.6°F | Resp 18 | Wt 129.2 lb

## 2020-06-11 DIAGNOSIS — C19 Malignant neoplasm of rectosigmoid junction: Secondary | ICD-10-CM | POA: Diagnosis not present

## 2020-06-11 DIAGNOSIS — Z800 Family history of malignant neoplasm of digestive organs: Secondary | ICD-10-CM | POA: Diagnosis not present

## 2020-06-11 DIAGNOSIS — Z8049 Family history of malignant neoplasm of other genital organs: Secondary | ICD-10-CM | POA: Insufficient documentation

## 2020-06-11 LAB — COMPREHENSIVE METABOLIC PANEL
ALT: 8 U/L (ref 0–44)
AST: 19 U/L (ref 15–41)
Albumin: 3.3 g/dL — ABNORMAL LOW (ref 3.5–5.0)
Alkaline Phosphatase: 52 U/L (ref 38–126)
Anion gap: 12 (ref 5–15)
BUN: 14 mg/dL (ref 6–20)
CO2: 25 mmol/L (ref 22–32)
Calcium: 8.6 mg/dL — ABNORMAL LOW (ref 8.9–10.3)
Chloride: 99 mmol/L (ref 98–111)
Creatinine, Ser: 1.45 mg/dL — ABNORMAL HIGH (ref 0.44–1.00)
GFR, Estimated: 43 mL/min — ABNORMAL LOW (ref >60)
Glucose, Bld: 107 mg/dL — ABNORMAL HIGH (ref 70–99)
Potassium: 3.9 mmol/L (ref 3.5–5.1)
Sodium: 136 mmol/L (ref 135–145)
Total Bilirubin: 0.5 mg/dL (ref 0.3–1.2)
Total Protein: 7.9 g/dL (ref 6.5–8.1)

## 2020-06-11 LAB — CBC WITH DIFFERENTIAL/PLATELET
Abs Immature Granulocytes: 0.04 10*3/uL (ref 0.00–0.07)
Basophils Absolute: 0 10*3/uL (ref 0.0–0.1)
Basophils Relative: 0 %
Eosinophils Absolute: 0.1 10*3/uL (ref 0.0–0.5)
Eosinophils Relative: 2 %
HCT: 29.5 % — ABNORMAL LOW (ref 36.0–46.0)
Hemoglobin: 9.4 g/dL — ABNORMAL LOW (ref 12.0–15.0)
Immature Granulocytes: 1 %
Lymphocytes Relative: 17 %
Lymphs Abs: 1.1 10*3/uL (ref 0.7–4.0)
MCH: 26.7 pg (ref 26.0–34.0)
MCHC: 31.9 g/dL (ref 30.0–36.0)
MCV: 83.8 fL (ref 80.0–100.0)
Monocytes Absolute: 0.3 10*3/uL (ref 0.1–1.0)
Monocytes Relative: 4 %
Neutro Abs: 4.9 10*3/uL (ref 1.7–7.7)
Neutrophils Relative %: 76 %
Platelets: 304 10*3/uL (ref 150–400)
RBC: 3.52 MIL/uL — ABNORMAL LOW (ref 3.87–5.11)
RDW: 17.8 % — ABNORMAL HIGH (ref 11.5–15.5)
WBC: 6.4 10*3/uL (ref 4.0–10.5)
nRBC: 0 % (ref 0.0–0.2)

## 2020-06-11 LAB — URINALYSIS, DIPSTICK ONLY
Bilirubin Urine: NEGATIVE
Glucose, UA: NEGATIVE mg/dL
Ketones, ur: NEGATIVE mg/dL
Nitrite: POSITIVE — AB
Protein, ur: 100 mg/dL — AB
Specific Gravity, Urine: 1.014 (ref 1.005–1.030)
pH: 5 (ref 5.0–8.0)

## 2020-06-11 NOTE — Progress Notes (Signed)
Pt has noticed urine has been dark yellow vs light yellow in color as it normally is.

## 2020-06-11 NOTE — Progress Notes (Signed)
REFERRING PROVIDER: [name], MD [address],  [place] [address]  PRIMARY PROVIDER:  Volney American, PA-C  PRIMARY REASON FOR VISIT:  1. Metastatic colorectal cancer ([place])   2. Family history of uterine cancer   3. Family history of pancreatic cancer      HISTORY OF PRESENT ILLNESS:   Anita Sullivan, a 54 y.o. female, was seen for a [facility] cancer genetics consultation at the request of Dr. Grayland Ormond due to a personal and family history of cancer.  Anita Sullivan presents to clinic today to discuss the possibility of a hereditary predisposition to cancer, genetic testing, and to further clarify her future cancer risks, as well as potential cancer risks for family members.   In 2021, at the age of 89, Anita Sullivan was diagnosed with stage IV colorectal cancer.  She is currently being treated with chemotherapy. Immunohistochemistry for DNA mismatch repair proteins was performed on her biopsy and was intact (normal).  CANCER HISTORY:  Oncology History  Metastatic colorectal cancer ([place])  05/28/2020 Initial Diagnosis   Metastatic colorectal cancer ([place])   05/31/2020 Cancer Staging   Staging form: Colon and Rectum, AJCC 8th Edition - Clinical stage from 05/31/2020: Stage IVC (cTX, cNX, pM1c) - Signed by [name], MD on 05/31/2020   06/04/2020 -  Chemotherapy   The patient had palonosetron (ALOXI) injection 0.25 mg, 0.25 mg, Intravenous,  Once, 1 of 12 cycles Administration: 0.25 mg (06/04/2020) leucovorin 650 mg in dextrose 5 % 250 mL infusion, 632 mg, Intravenous,  Once, 1 of 12 cycles Administration: 650 mg (06/04/2020) oxaliplatin (ELOXATIN) 135 mg in dextrose 5 % 500 mL chemo infusion, 85 mg/m2 = 135 mg, Intravenous,  Once, 1 of 12 cycles Administration: 135 mg (06/04/2020) fluorouracil (ADRUCIL) chemo injection 650 mg, 400 mg/m2 = 650 mg, Intravenous,  Once, 1 of 12 cycles Administration: 650 mg (06/04/2020) fluorouracil (ADRUCIL) 3,800 mg in sodium  chloride 0.9 % 74 mL chemo infusion, 2,400 mg/m2 = 3,800 mg, Intravenous, 1 Day/Dose, 1 of 12 cycles Administration: 3,800 mg (06/04/2020) bevacizumab-bvzr (ZIRABEV) 300 mg in sodium chloride 0.9 % 100 mL chemo infusion, 5 mg/kg = 300 mg, Intravenous,  Once, 0 of 11 cycles  for chemotherapy treatment.       RISK FACTORS:  Menarche was at age 84.  First live birth at age 34.  OCP use: yes Ovaries intact: yes.  Hysterectomy: no.  Menopausal status: postmenopausal.  HRT use: 0 years. Mammogram within the last year: no. Number of breast biopsies: 0.   Past Medical History:  Diagnosis Date  . Colon cancer ([place])   . Family history of pancreatic cancer   . Family history of uterine cancer     Past Surgical History:  Procedure Laterality Date  . COLONOSCOPY WITH PROPOFOL N/A 05/27/2020   Procedure: COLONOSCOPY WITH PROPOFOL;  Surgeon: [name], MD;  Location: [facility] ENDOSCOPY;  Service: Gastroenterology;  Laterality: N/A;  . FLEXIBLE SIGMOIDOSCOPY N/A 05/28/2020   Procedure: FLEXIBLE SIGMOIDOSCOPY;  Surgeon: [name], MD;  Location: [facility] ENDOSCOPY;  Service: Gastroenterology;  Laterality: N/A;  . IR NEPHROSTOMY PLACEMENT LEFT  05/23/2020  . IR NEPHROSTOMY PLACEMENT RIGHT  05/23/2020  . PORTACATH PLACEMENT Left 05/29/2020   Procedure: INSERTION PORT-A-CATH;  Surgeon: [name], MD;  Location: [facility] ORS;  Service: General;  Laterality: Left;    Social History   Socioeconomic History  . Marital status: Divorced    Spouse name: Not on file  . Number of children: Not  on file  . Years of education: Not on file  . Highest education level: Not on file  Occupational History  . Not on file  Tobacco Use  . Smoking status: Never Smoker  . Smokeless tobacco: Never Used  Vaping Use  . Vaping Use: Never used  Substance and Sexual Activity  . Alcohol use: Never  . Drug use: Never  . Sexual activity: Not Currently  Other Topics Concern  . Not on file  Social  History Narrative  . Not on file   Social Determinants of Health   Financial Resource Strain:   . Difficulty of Paying Living Expenses: Not on file  Food Insecurity:   . Worried About Charity fundraiser in the Last Year: Not on file  . Ran Out of Food in the Last Year: Not on file  Transportation Needs:   . Lack of Transportation (Medical): Not on file  . Lack of Transportation (Non-Medical): Not on file  Physical Activity:   . Days of Exercise per Week: Not on file  . Minutes of Exercise per Session: Not on file  Stress:   . Feeling of Stress : Not on file  Social Connections:   . Frequency of Communication with Friends and Family: Not on file  . Frequency of Social Gatherings with Friends and Family: Not on file  . Attends Religious Services: Not on file  . Active Member of Clubs or Organizations: Not on file  . Attends Archivist Meetings: Not on file  . Marital Status: Not on file     FAMILY HISTORY:  We obtained a detailed, 4-generation family history.  Significant diagnoses are listed below: Family History  Problem Relation Age of Onset  . Other Mother        Corticobasal degeneration  . Uterine cancer Mother   . Diabetes Maternal Grandmother   . Stroke Maternal Grandfather   . Stroke Paternal Grandfather   . Heart disease Father   . Pancreatic cancer Maternal Uncle 31  . Cancer Paternal Aunt        unk type  . Liver cancer Maternal Uncle   . Pancreatic cancer Cousin   . Cancer Cousin        unk type   Anita Sullivan has a son, 28 and a daughter, 37. She is expecting a granddaughter in February. Patient has 3 paternal half sisters and 2 paternal half brothers, none have had cancer.   Anita Sullivan mother had uterine cancer in her 93s and died at 40. Patient had 5 uncles and 2 aunts. One uncle had pancreatic cancer and died at 22. His son also died of pancreatic cancer, and a daughter was recently diagnosed with cancer patient is unsure of type. Another  maternal uncle had liver cancer. Maternal grandmother died at 76, grandfather died at 82, no cancers.   Anita Sullivan father died at 39, no cancer history. Patient had 11 paternal aunts/uncles. One aunt did have cancer, unknown type. No known cancers in paternal cousins, paternal grandmother died at 32, grandfather at 67.   Ms. Bidwell is unaware of previous family history of genetic testing for hereditary cancer risks. Patient's maternal ancestors are of Finnish/Dutch descent, and paternal ancestors are of English/Scottish descent. There is no reported Ashkenazi Jewish ancestry. There is no known consanguinity.    GENETIC COUNSELING ASSESSMENT: Ms. Placido is a 54 y.o. female with a personal and family history which is somewhat suggestive of a hereditary cancer syndrome and predisposition to cancer.  We, therefore, discussed and recommended the following at today's visit.   DISCUSSION: We discussed that approximately 5-7% of colorectal cancer is hereditary  Most cases of hereditary colorectal cancer are associated with Lynch syndrome genes. Testing on her tumor for signs of Lynch syndrome was normal, indicating she likely does not have this but does not rule it out. There are other genes associated with hereditary colorectal cancer as well as genes associated with other cancers in her family such as uterine and pancreatic .  We discussed that testing is beneficial for several reasons including knowing about other cancer risks, identifying potential screening and risk-reduction options that may be appropriate, and to understand if other family members could be at risk for cancer and allow them to undergo genetic testing.   We reviewed the characteristics, features and inheritance patterns of hereditary cancer syndromes. We also discussed genetic testing, including the appropriate family members to test, the process of testing, insurance coverage and turn-around-time for results. We discussed the implications of  a negative, positive and/or variant of uncertain significant result. We recommended Ms. Samad pursue genetic testing for the [facility] CustomNext+RNA gene panel.   The CustomNext-Cancer + RNAinsight panel  includes sequencing and/or deletion duplication testing of the following 91 genes: AIP, ALK, APC*, ATM*, AXIN2, BAP1, BARD1, BLM, BMPR1A, BRCA1*, BRCA2*, BRIP1*, CDC73, CDH1*, CDK4, CDKN1B, CDKN2A, CHEK2*, CTNNA1, DICER1, FANCC, FH, FLCN, GALNT12, KIF1B, LZTR1, MAX, MEN1, MET, MLH1*, MRE11A, MSH2*, MSH3, MSH6*, MUTYH*, NBN, NF1*, NF2, NTHL1, PALB2*, PHOX2B, PMS2*, POT1, PRKAR1A, PTCH1, PTEN*, RAD50, RAD51C*, RAD51D*, RB1, RECQL, RET, SDHA, SDHAF2, SDHB, SDHC, SDHD, SMAD4, SMARCA4, SMARCB1, SMARCE1, STK11, SUFU, TMEM127, TP53*, TSC1, TSC2, VHL and XRCC2 (sequencing and deletion/duplication); CASR, CFTR, CPA1, CTRC, EGFR, EGLN1, FAM175A, HOXB13, KIT, MITF, MLH3, PALLD, PDGFRA, POLD1, POLE, PRSS1, RINT1, RPS20, SPINK1 and TERT (sequencing only); EPCAM and GREM1 (deletion/duplication only).   Based on Ms. Meacham's personal and family history of cancer, she meets medical criteria for genetic testing. Despite that she meets criteria, she may still have an out of pocket cost. We discussed that if her out of pocket cost for testing is over $100, the laboratory will call and confirm whether she wants to proceed with testing.  If the out of pocket cost of testing is less than $100 she will be billed by the genetic testing laboratory.   PLAN: After considering the risks, benefits, and limitations, Ms. Zaragoza provided informed consent to pursue genetic testing. She will have her blood drawn next time she is here (11/17) and the blood sample will be sent to [facility] for analysis of the CustomNext+RNA panel. Results should be available within approximately 2-3 weeks' time, at which point they will be disclosed by telephone to Ms. Masin, as will any additional recommendations warranted by these results. Ms. Foglesong will  receive a summary of her genetic counseling visit and a copy of her results once available. This information will also be available in Epic.   Ms. Pelley questions were answered to her satisfaction today. Our contact information was provided should additional questions or concerns arise. Thank you for the referral and allowing [place] to share in the care of your patient.   Faith Rogue, MS, [facility] Genetic Counselor [email or web address] Phone: [phone]  The patient was seen for a total of 35 minutes in face-to-face genetic counseling.  Prospective genetic counseling student Julio Sicks was also present. Dr. Grayland Ormond was available for discussion regarding this case.   _______________________________________________________________________ For Office Staff:  Number of people involved in session: 2  Was an Intern/ student involved with case: yes

## 2020-06-12 ENCOUNTER — Other Ambulatory Visit: Payer: Self-pay | Admitting: Oncology

## 2020-06-12 ENCOUNTER — Telehealth: Payer: Self-pay | Admitting: *Deleted

## 2020-06-12 ENCOUNTER — Other Ambulatory Visit: Payer: Self-pay | Admitting: *Deleted

## 2020-06-12 MED ORDER — LEVOFLOXACIN 500 MG PO TABS: 500.0000 mg | ORAL_TABLET | Freq: Every day | ORAL | 0 refills | Status: AC

## 2020-06-12 NOTE — Telephone Encounter (Signed)
Called pt and notified her that urine sample showed an UTI.  Dr Grayland Ormond has ordered levaquin for her to take for 5 days.  Pt verbalized understanding and request it be sent to [facility].

## 2020-06-13 ENCOUNTER — Other Ambulatory Visit: Payer: Self-pay

## 2020-06-13 ENCOUNTER — Ambulatory Visit (INDEPENDENT_AMBULATORY_CARE_PROVIDER_SITE_OTHER): Payer: Self-pay | Admitting: Surgery

## 2020-06-13 ENCOUNTER — Encounter: Payer: Self-pay | Admitting: Surgery

## 2020-06-13 VITALS — BP 124/86 | HR 125 | Temp 98.2°F | Ht 60.0 in | Wt 128.8 lb

## 2020-06-13 DIAGNOSIS — J95811 Postprocedural pneumothorax: Secondary | ICD-10-CM

## 2020-06-13 DIAGNOSIS — C19 Malignant neoplasm of rectosigmoid junction: Secondary | ICD-10-CM

## 2020-06-13 DIAGNOSIS — Z95828 Presence of other vascular implants and grafts: Secondary | ICD-10-CM

## 2020-06-13 NOTE — Progress Notes (Signed)
06/13/2020  HPI: Anita Sullivan is a 54 y.o. female s/p left IJ port-a-cath placementon 86/16, complicated by post-procedural pneumothorax on the left.  She underwent percutaneous chest tube placement with IR afterwards.  This was done for metastastic colorectal cancer, and she's currently followed by Dr. Grayland Ormond and undergoing FOLFOX treatment.  She reports fatigue and some nausea at night.  Has decreased appetite which was there before chemo started.  Some mild discomfort at the port-a-cath site initially but now is doing well.  No problems per patient accessing the port last week.  Vital signs: BP 124/86   Pulse (!) 125   Temp 98.2 F (36.8 C)   Ht 5' (1.524 m)   Wt 128 lb 12.8 oz (58.4 kg)   SpO2 98%   BMI 25.15 kg/m    Physical Exam: Constitutional:  No acute distress Skin:  Left port-a-cath site healing well, clean, dry, intact.  No erythema or wound breakdown.  Assessment/Plan: This is a 54 y.o. female s/p left IJ port-a-cath placement, complicated by left pneumothorax.  --Discussed with the patient some nutritional recommendations with Ensure or Boost to help with protein/calorie intake  She's seeing a Dietician next week to discuss further. --Port-a-cath site healing well without complications. --Follow up with [place] as needed.   Melvyn Neth, [facility]

## 2020-06-13 NOTE — Patient Instructions (Addendum)
Try increasing you protein intake. Shakes or protein powder added to shakes.   Follow up here as needed.  Contact [facility] to see about your Nephostomy tube exchanges.    Protein Supplement Powders Protein is a nutrient that helps the body build tissue, muscle, red blood cells, enzymes, antibodies, and hormones. Protein is found naturally in many foods, such as meat, beans, and dairy products. Most athletes need to eat more protein than non-athletes. Athletes place more stress on their muscles, and extra protein helps to repair these muscles. In female athletes, extra protein also helps to maintain regular menstrual cycles. Generally:  Average adults should consume about 0.36 grams of protein daily for every pound of their body weight. For example, a 150-pound adult should consume about 54 grams of protein each day.  Athletes should consume 0.63-0.90 grams of protein daily for every pound of their body weight. For example, a 150-pound athlete should consume 94.5-135 grams of protein each day. Daily protein needs can usually be met through food. However, protein supplement powders are a safe and effective way to increase daily protein intake, if you:  Eat a vegan diet.  Are actively trying to build muscle mass.  Are growing.  Are recovering from injury. What are protein supplement powders? Protein supplement powders are supplements that contain large amounts of protein. They can be easily digested, absorbed, and used by the body. These proteins contain the essential building blocks (amino acids) that are central to muscle function, repair, and recovery. Protein supplement powders most often consist of:  Animal-based proteins, including: ? Whey. ? Casein. ? Milk. ? Eggs.  Vegetable-based proteins, including: ? Soy. ? Pea. What are the risks of taking protein supplement powders? The risks of taking a protein supplement powder may vary depending on the type of  protein in the supplement. Risks may include:  Allergies. ? If you have an allergy to cow's milk, avoid protein supplements that contain milk, whey, or casein. ? If you have an allergy to soy, avoid protein supplements that contain soy or lecithin.  Extra stress on your kidneys, if you have kidney disease or diabetes. Getting too much protein or not balancing protein with other nutrients may lead to:  Stomach (gastrointestinal) discomfort.  Difficulty passing stool (constipation).  Diarrhea.  Dehydration.  Nausea.  Headache.  Fatigue. Follow these instructions at home: How to use protein supplement powders  Take protein supplements only as directed. Do not take a higher dose than was recommended for you. Protein supplement powders are not regulated. Some products may contain impurities or ingredients that differ from those on the label.  Follow the directions on the product label for recommended serving sizes and daily servings.  Follow the directions that are provided on the label to mix the powder with water. Specific proportions and directions vary by product.  To help build muscles and limit muscle damage during exercise: ? Eat a protein supplement powder before lifting weights or doing resistance training. ? Eat a protein supplement powder within 2 hours after exercise. ? Eat protein in a ratio of 1 protein for every 3 carbohydrates. For example, one serving of protein powder that contains 20 grams of protein should be consumed with 60 grams of carbohydrates. This is equivalent to 1 banana and 1 piece of toast. General instructions  Always talk with your health care provider before starting protein supplement powders.  Drink enough fluid to keep your urine pale yellow. Drink fluids before, during, and after exercise, and  throughout the day.  Try to meet your daily protein needs with food. Eat a balanced diet of fruits, vegetables, meat, dairy, and grains, in addition to  taking a protein supplement powder.  Try to eat protein every 3-4 hours throughout the day.  Eat protein-rich foods along with protein supplement powders. Foods high in protein include:     ? Lean meat, poultry, and fish. ? Eggs. ? Milk, yogurt, and cheese. ? Soybeans and tofu. ? Beans and lentils. Summary  Protein is a nutrient that helps the body build tissue, muscle, red blood cells, enzymes, antibodies, and hormones.  Athletes need to include more protein in their diets than non-athletes do.  Protein supplement powders should be taken in addition to, not in place of, a balanced diet to meet daily protein needs. This information is not intended to replace advice given to you by your health care provider. Make sure you discuss any questions you have with your health care provider. Document Revised: 08/24/2017 Document Reviewed: 08/24/2017 [employer] Patient Education  2020 [employer].

## 2020-06-14 ENCOUNTER — Other Ambulatory Visit: Payer: Self-pay | Admitting: Oncology

## 2020-06-14 DIAGNOSIS — C19 Malignant neoplasm of rectosigmoid junction: Secondary | ICD-10-CM

## 2020-06-14 NOTE — Progress Notes (Signed)
[place]  [phone] [phone]  ID: Anita Sullivan OB: Mar 28, 1966  MR#: [id number]  RAQ#:[id number]  Patient Care Team: Volney American, PA-C as PCP - General (Family Medicine) [name], MD as Consulting Physician (Oncology) [name], RN as Oncology Nurse Navigator  CHIEF COMPLAINT: Metastatic colorectal adenocarcinoma, KRAS wild-type  INTERVAL HISTORY: Patient returns to clinic today for further evaluation and consideration of cycle two of FOLFOX. She continues to have occasional mild rectal bleeding, but otherwise feels well. She does not complain of weakness or fatigue today. She has no neurologic complaints.  She denies any recent fevers.  She has a fair appetite, but denies weight loss.  She denies any chest pain, shortness of breath, cough, or hemoptysis.  She has some mild nausea, but denies any vomiting, constipation, or diarrhea.  She has no urinary complaints. Patient offers no further specific complaints today.  REVIEW OF SYSTEMS:   Review of Systems  Constitutional: Negative.  Negative for fever, malaise/fatigue and weight loss.  Respiratory: Negative.  Negative for cough, hemoptysis and shortness of breath.   Cardiovascular: Negative.  Negative for chest pain and leg swelling.  Gastrointestinal: Positive for blood in stool. Negative for constipation, nausea and vomiting.  Genitourinary: Negative.  Negative for hematuria.  Musculoskeletal: Negative.  Negative for back pain.  Skin: Negative.  Negative for rash.  Neurological: Negative.  Negative for dizziness, seizures, weakness and headaches.  Psychiatric/Behavioral: Negative.  The patient is not nervous/anxious.     As per HPI. Otherwise, a complete review of systems is negative.  PAST MEDICAL HISTORY: Past Medical History:  Diagnosis Date  . Colon cancer ([place])   . Family history of pancreatic cancer   . Family history of uterine cancer      PAST SURGICAL HISTORY: Past Surgical History:  Procedure Laterality Date  . COLONOSCOPY WITH PROPOFOL N/A 05/27/2020   Procedure: COLONOSCOPY WITH PROPOFOL;  Surgeon: [name], MD;  Location: [facility] ENDOSCOPY;  Service: Gastroenterology;  Laterality: N/A;  . FLEXIBLE SIGMOIDOSCOPY N/A 05/28/2020   Procedure: FLEXIBLE SIGMOIDOSCOPY;  Surgeon: [name], MD;  Location: [facility] ENDOSCOPY;  Service: Gastroenterology;  Laterality: N/A;  . IR NEPHROSTOMY PLACEMENT LEFT  05/23/2020  . IR NEPHROSTOMY PLACEMENT RIGHT  05/23/2020  . PORTACATH PLACEMENT Left 05/29/2020   Procedure: INSERTION PORT-A-CATH;  Surgeon: [name], MD;  Location: [facility] ORS;  Service: General;  Laterality: Left;    FAMILY HISTORY: Family History  Problem Relation Age of Onset  . Other Mother        Corticobasal degeneration  . Uterine cancer Mother   . Diabetes Maternal Grandmother   . Stroke Maternal Grandfather   . Stroke Paternal Grandfather   . Heart disease Father   . Pancreatic cancer Maternal Uncle 89  . Cancer Paternal Aunt        unk type  . Liver cancer Maternal Uncle   . Pancreatic cancer Cousin   . Cancer Cousin        unk type    ADVANCED DIRECTIVES (Y/N):  N  HEALTH MAINTENANCE: Social History   Tobacco Use  . Smoking status: Never Smoker  . Smokeless tobacco: Never Used  Vaping Use  . Vaping Use: Never used  Substance Use Topics  . Alcohol use: Never  . Drug use: Never     Colonoscopy:  PAP:  Bone density:  Lipid panel:  No Known Allergies  Current Outpatient Medications  Medication Sig Dispense Refill  . pantoprazole (PROTONIX)  40 MG tablet Take 1 tablet (40 mg total) by mouth 2 (two) times daily. 60 tablet 0   No current facility-administered medications for this visit.    OBJECTIVE: Vitals:   06/18/20 0827  BP: 114/77  Pulse: 96  Temp: (!) 96.9 F (36.1 C)     Body mass index is 25.78 kg/m.    ECOG FS:1 - Symptomatic but completely  ambulatory  General: Well-developed, well-nourished, no acute distress. Eyes: Pink conjunctiva, anicteric sclera. HEENT: Normocephalic, moist mucous membranes. Lungs: No audible wheezing or coughing. Heart: Regular rate and rhythm. Abdomen: Soft, nontender, no obvious distention. Musculoskeletal: No edema, cyanosis, or clubbing. Neuro: Alert, answering all questions appropriately. Cranial nerves grossly intact. Skin: No rashes or petechiae noted. Psych: Normal affect.   LAB RESULTS:  Lab Results  Component Value Date   NA 138 06/18/2020   K 3.3 (L) 06/18/2020   CL 105 06/18/2020   CO2 23 06/18/2020   GLUCOSE 109 (H) 06/18/2020   BUN 14 06/18/2020   CREATININE 1.26 (H) 06/18/2020   CALCIUM 8.6 (L) 06/18/2020   PROT 6.9 06/18/2020   ALBUMIN 3.0 (L) 06/18/2020   AST 15 06/18/2020   ALT 9 06/18/2020   ALKPHOS 51 06/18/2020   BILITOT 0.6 06/18/2020   GFRNONAA 51 (L) 06/18/2020    Lab Results  Component Value Date   WBC 3.1 (L) 06/18/2020   NEUTROABS 1.1 (L) 06/18/2020   HGB 9.0 (L) 06/18/2020   HCT 27.8 (L) 06/18/2020   MCV 83.2 06/18/2020   PLT 253 06/18/2020     STUDIES: CT ABDOMEN PELVIS WO CONTRAST  Result Date: 05/22/2020 CLINICAL DATA:  Intermittent rectal bleeding. EXAM: CT ABDOMEN AND PELVIS WITHOUT CONTRAST TECHNIQUE: Multidetector CT imaging of the abdomen and pelvis was performed following the standard protocol without IV contrast. COMPARISON:  None. FINDINGS: Lower chest: No acute abnormality. Hepatobiliary: A 2.9 cm x 3.1 cm ill-defined area of heterogeneous low attenuation is seen within the lateral aspect of the liver dome (axial CT image 17, CT series number 2). An ill-defined, slightly exophytic 6.9 cm x 5.0 cm area of heterogeneous mildly decreased attenuation is seen within the posterior aspect of the right lobe of the liver. An ill-defined central area of low attenuation is noted within this region. No gallstones, gallbladder wall thickening, or  biliary dilatation. Pancreas: Unremarkable. No pancreatic ductal dilatation or surrounding inflammatory changes. Spleen: Normal in size without focal abnormality. Adrenals/Urinary Tract: Adrenal glands are unremarkable. Kidneys are normal in size, without focal lesions. Marked severity bilateral hydronephrosis and hydroureter is seen. The urinary bladder is partially contracted and subsequently limited in evaluation. Stomach/Bowel: Stomach is within normal limits. Appendix appears normal. No evidence of bowel dilatation. There is marked severity thickening of the mid and distal sigmoid colon associated pericolonic inflammatory fat stranding is noted. Vascular/Lymphatic: No significant vascular findings are present. There is moderate severity para-aortic and aortocaval lymphadenopathy. The largest lymph node measures approximately 1.7 cm x 1.7 cm along the para-aortic region at the level of the kidney. It should be noted that this area is limited in evaluation in the absence of intravenous contrast. A 2.8 cm x 2.4 cm heterogeneous soft tissue mass is seen along the posterior aspect of the distal left iliac vessels (axial CT images 68 through 74, CT series number 2). Numerous subcentimeter soft tissue nodules are seen within the mesentery of the pelvis, along the midline (axial CT images 58 through 66, CT series number 2). Numerous additional small soft tissue nodules are seen  surrounding the distal sigmoid colon and rectosigmoid junction. Reproductive: The uterus is mildly enlarged. The uterine fundus is positioned to the left of midline within the anterior aspect of the pelvis. Other: No abdominal wall hernia or abnormality. A small amount of abdominal and pelvic fluid is noted. Musculoskeletal: No acute or significant osseous findings. IMPRESSION: 1. Marked severity sigmoid colitis. Due to the numerous areas of soft tissue nodularity within the mesentery in this region, an underlying neoplastic process cannot be  excluded. Correlation with colonoscopy is recommended. 2. Heterogeneous liver masses, as described above, suspicious for an underlying neoplasm. MRI correlation is recommended. 3. Moderate severity para-aortic and aortocaval lymphadenopathy with an additional enlarged, irregular appearing lymph node seen along the posterior aspect of the distal left iliac vessels. This is also worrisome for sequelae associated with an underlying neoplasm. 4. Marked severity bilateral hydronephrosis and hydroureter which likely represents partial renal obstruction secondary to the inflammatory and suspected neoplastic process seen within the pelvis. Electronically Signed   By: Virgina Norfolk M.D.   On: 05/22/2020 20:26   DG Chest 1 View  Result Date: 05/31/2020 CLINICAL DATA:  Follow-up left pneumothorax. EXAM: CHEST  1 VIEW COMPARISON:  May 31, 2020 FINDINGS: Left-sided injectable port in stable position. Left chest tube in stable position. Cardiomediastinal silhouette is normal. Mediastinal contours appear intact. There is no evidence of focal airspace consolidation, pleural effusion. No radiographically apparent pneumothorax. Osseous structures are without acute abnormality. Soft tissues are grossly normal. IMPRESSION: 1. No radiographically apparent pneumothorax. 2. Left chest tube in stable position. Electronically Signed   By: Fidela Salisbury M.D.   On: 05/31/2020 13:31   DG Chest 1 View  Result Date: 05/31/2020 CLINICAL DATA:  Follow-up left pneumothorax. EXAM: CHEST  1 VIEW COMPARISON:  05/30/2020 and earlier studies. FINDINGS: Left-sided chest tube is stable, curled tip projecting over the lateral left hilum. No pneumothorax. Mild atelectasis extending laterally from the left hilum and along the medial left lung base, unchanged. Lungs otherwise clear. No pleural effusion. Small amount of subcutaneous air at the left neck base, stable. IMPRESSION: 1. Stable left-sided chest tube with no visible  pneumothorax. 2. No acute findings. Mild stable atelectasis on the left as described. Electronically Signed   By: Lajean Manes M.D.   On: 05/31/2020 06:54   CT CHEST WO CONTRAST  Result Date: 05/27/2020 CLINICAL DATA:  New diagnosis of rectal cancer. EXAM: CT CHEST WITHOUT CONTRAST TECHNIQUE: Multidetector CT imaging of the chest was performed following the standard protocol without IV contrast. COMPARISON:  Abdominal CT 05/22/2020 FINDINGS: Cardiovascular: Normal aortic caliber. Normal heart size, without pericardial effusion. Mediastinum/Nodes: No supraclavicular adenopathy. No mediastinal or definite hilar adenopathy, given limitations of unenhanced CT. Lungs/Pleura: No pleural fluid. Left base scarring or subsegmental atelectasis. Nonspecific 2 mm right upper lobe pulmonary nodule on 29/3. Calcified granuloma of 2 mm in the right lower lobe on 85/3. Upper Abdomen: Subtle hypoattenuating hepatic dome lesion again identified. The dominant right hepatic lobe mass is excluded. Normal imaged portions of the spleen, stomach, pancreas, adrenal glands, right kidney. Musculoskeletal: Presumed bone island within the sternal body. IMPRESSION: 1.  No acute process or evidence of metastatic disease in the chest. 2. Nonspecific tiny right upper lobe pulmonary nodule. No thoracic adenopathy. 3. Liver masses, as before. Electronically Signed   By: Abigail Miyamoto M.D.   On: 05/27/2020 19:34   [place] Intraoperative  Result Date: 05/23/2020 INDICATION: Patient admitted with new diagnosis of malignancy of unknown etiology now with bilateral obstructive hydronephrosis.  Request made for placement of bilateral percutaneous nephrostomy catheters for urinary diversion purposes. EXAM: 1. ULTRASOUND GUIDANCE FOR PUNCTURE OF THE BILATERAL RENAL COLLECTING SYSTEMS 2. BILATERAL PERCUTANEOUS NEPHROSTOMY TUBE PLACEMENT. COMPARISON:  CT abdomen and pelvis-05/22/2020 MEDICATIONS: Ancef 2 gm IV; The antibiotic was administered in an  appropriate time frame prior to skin puncture. ANESTHESIA/SEDATION: Moderate (conscious) sedation was employed during this procedure. A total of Versed 4 mg and Fentanyl 100 mcg was administered intravenously. Moderate Sedation Time: 22 minutes. The patient's level of consciousness and vital signs were monitored continuously by radiology nursing throughout the procedure under my direct supervision. CONTRAST:  A total of 20 cc Visipaque 320 was administered into both renal collecting systems. FLUOROSCOPY TIME:  1 minute, 42 seconds (29.9 mGy) COMPLICATIONS: None immediate. PROCEDURE: The procedure, risks, benefits, and alternatives were explained to the patient. Questions regarding the procedure were encouraged and answered. The patient understands and consents to the procedure. A timeout was performed prior to the initiation of the procedure. The bilateral flanks were prepped and draped in the usual sterile fashion and a sterile drape was applied covering the operative field. A sterile gown and sterile gloves were used for the procedure. Local anesthesia was provided with 1% Lidocaine with epinephrine. Attention was first paid towards placement of the left-sided percutaneous nephrostomy catheter. Ultrasound was used to localize the left kidney. Under direct ultrasound guidance, a 20 gauge needle was advanced into the renal collecting system. An ultrasound image documentation was performed. Access within the collecting system was confirmed with the efflux of urine followed by limited contrast injection. Over a Nitrex wire, the tract was dilated with an Accustick stent. Next, under intermittent fluoroscopic guidance and over a short Amplatz wire, the track was dilated ultimately allowing placement of a 10-French percutaneous nephrostomy catheter which was advanced to the level of the renal pelvis where the coil was formed and locked. Contrast was injected and several spot fluoroscopic images were obtained in various  obliquities. The catheter was secured at the skin with a Prolene retention suture and stat lock device and connected to a gravity bag was placed. The identical procedure was repeated for the contralateral right kidney allowing placement of a 10 French percutaneous nephrostomy catheter with end ultimately coiled and locked within the right renal pelvis. Dressings were applied. The patient tolerated procedure well without immediate postprocedural complication. 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 Under a combination of ultrasound and fluoroscopic guidance, a posterior inferior calix was targeted bilaterally allowing placement of bilateral 10 French percutaneous nephrostomy catheters with ends coiled and locked within the bilateral renal pelvises. Contrast injection confirmed appropriate positioning. IMPRESSION: Successful ultrasound and fluoroscopic guided placement of bilateral 10 French PCNs. Electronically Signed   By: Sandi Mariscal M.D.   On: 05/23/2020 12:55   US RENAL  Result Date: 05/23/2020 CLINICAL DATA:  Acute kidney injury EXAM: RENAL / URINARY TRACT ULTRASOUND COMPLETE COMPARISON:  CT 05/22/2020 FINDINGS: Right Kidney: Renal measurements: 11.5 x 5.2 x 5.5 cm = volume: 170 mL. Mild renal cortical thinning with increased cortical echogenicity. Severe right hydronephrosis. The visualized right ureter is moderately dilated. No discrete shadowing stone or mass lesion identified. Left Kidney: Renal measurements: 12.5 x 5.2 x 4.7 cm = volume: 162 mL. Cortical thickness within normal limits. Increased renal cortical echogenicity. Moderate to severe hydronephrosis. Visualized left ureter is mildly dilated. No discrete shadowing stone or mass lesion  identified. Bladder: Partially distended.  No definite abnormality. Other: Inferior right lower lobe solid  intrahepatic mass measuring up to 6.0 cm. Irregular and heterogeneous appearance of the uterus is partially visualized on images within the pelvis, incompletely characterized. IMPRESSION: 1. Severe right hydroureteronephrosis. 2. Moderate to severe left hydronephrosis. 3. Increased renal cortical echogenicity bilaterally suggesting medical renal disease. 4. Very irregular appearance of visualized portion of the uterus noted on transabdominal imaging through the pelvis. Recommend dedicated pelvic ultrasound for further evaluation. 5. Solid 6.0 cm inferior right hepatic lobe mass most suggestive of metastatic disease based on the previous CT. Electronically Signed   By: Davina Poke D.O.   On: 05/23/2020 10:04   US BIOPSY (LIVER)  Result Date: 05/26/2020 INDICATION: Rectal mass with associated liver masses. EXAM: ULTRASOUND GUIDED CORE BIOPSY OF LIVER MEDICATIONS: None. ANESTHESIA/SEDATION: Fentanyl 1.0 mcg IV; Versed 50 mg IV Moderate Sedation Time:  15 minutes. The patient was continuously monitored during the procedure by the interventional radiology nurse under my direct supervision. PROCEDURE: The procedure, risks, benefits, and alternatives were explained to the patient. Questions regarding the procedure were encouraged and answered. The patient understands and consents to the procedure. A time-out was performed prior to initiating the procedure. Ultrasound was performed to localize liver lesions. The abdominal wall was prepped with chlorhexidine in a sterile fashion, and a sterile drape was applied covering the operative field. A sterile gown and sterile gloves were used for the procedure. Local anesthesia was provided with 1% Lidocaine. Under ultrasound guidance, a 42 gauge trocar needle was advanced into the liver at the level of a lesion within the right lobe. Three separate coaxial 18 gauge core biopsy samples were obtained with an 18 gauge device. Material was submitted in formalin. Gel-Foam  pledgets were advanced through the outer needle as it was retracted and removed. Additional ultrasound was performed. COMPLICATIONS: None immediate. FINDINGS: The largest mass in the liver as noted by CT is within the inferior aspect of the right lobe. By ultrasound this lesion measures approximately 6.5 x 5.1 x 6.1 cm. Solid tissue was obtained. IMPRESSION: Ultrasound-guided core biopsy performed at the level of a 6.5 cm mass within the inferior aspect of the right lobe of the liver. Electronically Signed   By: Aletta Edouard M.D.   On: 05/26/2020 15:27   DG Chest Port 1 View  Result Date: 05/30/2020 CLINICAL DATA:  Follow-up pneumothorax EXAM: PORTABLE CHEST 1 VIEW COMPARISON:  Yesterday FINDINGS: Pigtail catheter over the left mid chest with no visible pneumothorax remaining. Small volume soft tissue gas at the left apex. Left-sided porta catheter with tip at the distal left brachiocephalic. Retrocardiac atelectasis. Artifact from EKG leads. IMPRESSION: No visible residual pneumothorax. Electronically Signed   By: Monte Fantasia M.D.   On: 05/30/2020 04:31   DG Chest Port 1 View  Result Date: 05/29/2020 CLINICAL DATA:  Follow-up pneumothorax EXAM: PORTABLE CHEST 1 VIEW COMPARISON:  Earlier same day FINDINGS: Left Port-A-Cath remains in place. Left pneumothorax is larger, increased from about 10% to about 25%. No evidence of tension or mediastinal shift. Mild atelectasis the left lung base. IMPRESSION: Enlarging left pneumothorax, 10% to about 25%. No evidence of tension or mediastinal shift. These results will be called to the ordering clinician or representative by the Radiologist Assistant, and communication documented in the PACS or [employer]. Electronically Signed   By: Nelson Chimes M.D.   On: 05/29/2020 19:20   DG Chest Port 1 View  Result Date: 05/29/2020 CLINICAL DATA:  54 year old female status post Port-A-Cath placement. EXAM: PORTABLE CHEST 1 VIEW COMPARISON:  Chest CT dated  05/27/2020. FINDINGS: Left-sided Port-A-Cath with tip over central SVC. There is a small left apical pneumothorax measuring approximately 2.4 cm in thickness. No focal consolidation, or pleural effusion. The cardiac silhouette is within limits. No acute osseous pathology. Small pockets of soft tissue air noted over the left clavicle. IMPRESSION: Status post Port-A-Cath placement.  Small left apical pneumothorax. These results were called by telephone at the time of interpretation on 05/29/2020 at 4:26 pm to nurse Maree Krabbe, who verbally acknowledged these results. Electronically Signed   By: Anner Crete M.D.   On: 05/29/2020 16:33   DG C-Arm 1-60 Min-No Report  Result Date: 05/29/2020 Fluoroscopy was utilized by the requesting physician.  No radiographic interpretation.   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 Request made for placement of bilateral percutaneous nephrostomy catheters for urinary diversion purposes. EXAM: 1. ULTRASOUND GUIDANCE FOR PUNCTURE OF THE BILATERAL RENAL COLLECTING SYSTEMS 2. BILATERAL PERCUTANEOUS NEPHROSTOMY TUBE PLACEMENT. COMPARISON:  CT abdomen and pelvis-05/22/2020 MEDICATIONS: Ancef 2 gm IV; The antibiotic was administered in an appropriate time frame prior to skin puncture. ANESTHESIA/SEDATION: Moderate (conscious) sedation was employed during this procedure. A total of Versed 4 mg and Fentanyl 100 mcg was administered intravenously. Moderate Sedation Time: 22 minutes. The patient's level of consciousness and vital signs were monitored continuously by radiology nursing throughout the procedure under my direct supervision. CONTRAST:  A total of 20 cc Visipaque 320 was administered into both renal collecting systems. FLUOROSCOPY TIME:  1 minute, 42 seconds (22.0 mGy) COMPLICATIONS: None immediate. PROCEDURE: The procedure,  risks, benefits, and alternatives were explained to the patient. Questions regarding the procedure were encouraged and answered. The patient understands and consents to the procedure. A timeout was performed prior to the initiation of the procedure. The bilateral flanks were prepped and draped in the usual sterile fashion and a sterile drape was applied covering the operative field. A sterile gown and sterile gloves were used for the procedure. Local anesthesia was provided with 1% Lidocaine with epinephrine. Attention was first paid towards placement of the left-sided percutaneous nephrostomy catheter. Ultrasound was used to localize the left kidney. Under direct ultrasound guidance, a 20 gauge needle was advanced into the renal collecting system. An ultrasound image documentation was performed. Access within the collecting system was confirmed with the efflux of urine followed by limited contrast injection. Over a Nitrex wire, the tract was dilated with an Accustick stent. Next, under intermittent fluoroscopic guidance and over a short Amplatz wire, the track was dilated ultimately allowing placement of a 10-French percutaneous nephrostomy catheter which was advanced to the level of the renal pelvis where the coil was formed and locked. Contrast was injected and several spot fluoroscopic images were obtained in various obliquities. The catheter was secured at the skin with a Prolene retention suture and stat lock device and connected to a gravity bag was placed. The identical procedure was repeated for the contralateral right kidney allowing placement of a 10 French percutaneous nephrostomy catheter with end ultimately coiled and locked within the right renal pelvis. Dressings were applied. The patient tolerated procedure well without immediate postprocedural complication. FINDINGS: Ultrasound scanning demonstrates a moderate to severely dilatation of the bilateral renal collecting systems, right greater than  left, as demonstrated on preceding abdominal CT. Under a combination of ultrasound and fluoroscopic guidance,  a posterior inferior calix was targeted bilaterally allowing placement of bilateral 10 French percutaneous nephrostomy catheters with ends coiled and locked within the bilateral renal pelvises. Contrast injection confirmed appropriate positioning. IMPRESSION: Successful ultrasound and fluoroscopic guided placement of bilateral 10 French PCNs. Electronically Signed   By: Sandi Mariscal M.D.   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 Request made for placement of bilateral percutaneous nephrostomy catheters for urinary diversion purposes. EXAM: 1. ULTRASOUND GUIDANCE FOR PUNCTURE OF THE BILATERAL RENAL COLLECTING SYSTEMS 2. BILATERAL PERCUTANEOUS NEPHROSTOMY TUBE PLACEMENT. COMPARISON:  CT abdomen and pelvis-05/22/2020 MEDICATIONS: Ancef 2 gm IV; The antibiotic was administered in an appropriate time frame prior to skin puncture. ANESTHESIA/SEDATION: Moderate (conscious) sedation was employed during this procedure. A total of Versed 4 mg and Fentanyl 100 mcg was administered intravenously. Moderate Sedation Time: 22 minutes. The patient's level of consciousness and vital signs were monitored continuously by radiology nursing throughout the procedure under my direct supervision. CONTRAST:  A total of 20 cc Visipaque 320 was administered into both renal collecting systems. FLUOROSCOPY TIME:  1 minute, 42 seconds (84.6 mGy) COMPLICATIONS: None immediate. PROCEDURE: The procedure, risks, benefits, and alternatives were explained to the patient. Questions regarding the procedure were encouraged and answered. The patient understands and consents to the procedure. A timeout was performed prior to the initiation of the procedure. The bilateral flanks  were prepped and draped in the usual sterile fashion and a sterile drape was applied covering the operative field. A sterile gown and sterile gloves were used for the procedure. Local anesthesia was provided with 1% Lidocaine with epinephrine. Attention was first paid towards placement of the left-sided percutaneous nephrostomy catheter. Ultrasound was used to localize the left kidney. Under direct ultrasound guidance, a 20 gauge needle was advanced into the renal collecting system. An ultrasound image documentation was performed. Access within the collecting system was confirmed with the efflux of urine followed by limited contrast injection. Over a Nitrex wire, the tract was dilated with an Accustick stent. Next, under intermittent fluoroscopic guidance and over a short Amplatz wire, the track was dilated ultimately allowing placement of a 10-French percutaneous nephrostomy catheter which was advanced to the level of the renal pelvis where the coil was formed and locked. Contrast was injected and several spot fluoroscopic images were obtained in various obliquities. The catheter was secured at the skin with a Prolene retention suture and stat lock device and connected to a gravity bag was placed. The identical procedure was repeated for the contralateral right kidney allowing placement of a 10 French percutaneous nephrostomy catheter with end ultimately coiled and locked within the right renal pelvis. Dressings were applied. The patient tolerated procedure well without immediate postprocedural complication. 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 Under a combination of ultrasound and fluoroscopic guidance, a posterior inferior calix was targeted bilaterally allowing placement of bilateral 10 French percutaneous nephrostomy catheters with ends coiled and locked within  the bilateral renal pelvises. Contrast injection confirmed appropriate positioning. IMPRESSION: Successful ultrasound and fluoroscopic guided placement of bilateral 10 French PCNs. Electronically Signed   By: Sandi Mariscal M.D.   On: 05/23/2020 12:55   CT IMAGE GUIDED DRAINAGE BY PERCUTANEOUS CATHETER  Result Date: 06/09/2020 CLINICAL DATA:  Pneumothorax after port placement EXAM: CT GUIDED  PLEURAL CATHETER PLACEMENT ANESTHESIA/SEDATION: Intravenous Fentanyl 57mg and Versed 113mwere administered as conscious sedation during continuous monitoring of the patient's level of consciousness and physiological / cardiorespiratory status by the radiology RN, with a total moderate sedation time of 34 minutes. PROCEDURE: The procedure, risks, benefits, and alternatives were explained to the patient. Questions regarding the procedure were encouraged and answered. The patient understands and consents to the procedure. Select axial scans through the thorax were obtained and an appropriate skin entry site was determined. The operative field was prepped with chlorhexidinein a sterile fashion, and a sterile drape was applied covering the operative field. A sterile gown and sterile gloves were used for the procedure. Local anesthesia was provided with 1% Lidocaine. 18 gauge trocar needle advanced into the left pleural space. Air could be aspirated. Amplatz wire advanced easily. Tract dilated to facilitate placement of a 14 French pigtail catheter, directed towards the apex. Catheter was placed to -20 cm H2O Pleur-Evac suction. CT confirms good catheter position and evacuation of much of the pneumothorax. The patient tolerated the procedure well. COMPLICATIONS: None immediate FINDINGS: Large left pneumothorax was identified. 1434rench chest tube placed as above. IMPRESSION: Technically successful CT-guided left chest tube placement. Electronically Signed   By: D Lucrezia Europe.D.   On: 06/09/2020 07:28    ASSESSMENT: Metastatic  colorectal adenocarcinoma,  KRAS wild-type.  PLAN:   1. Metastatic colorectal adenocarcinoma: Although unclear whether malignancy originated in colon or rectum, given the pattern of spread to liver, will treat as a stage IV colon cancer.  CEA is only mildly elevated at 9.9.  Patient has had port placement.  Plan to treat with FOLFOX plus Avastin every 2 weeks.  Continue to hold Avastin given continued rectal bleeding. Proceed with cycle two of FOLFOX today. Return to clinic in 2 days for pump removal and then in 2 weeks for further evaluation and consideration of cycle three. Appreciate palliative care and genetics input.  2.  Anemia: Hemoglobin continues to slowly trend down and is now 9.0. Continue to hold Avastin as above. 3.  Renal insufficiency: Improving. Patient's creatinine is 1.26. Referral has been sent to IR for consideration of removal or exchange of nephrostomy tubes. 4. Leukopenia: Mild, monitor. 5. Hypokalemia: Patient was given dietary recommendations.  Patient expressed understanding and was in agreement with this plan. She also understands that She can call clinic at any time with any questions, concerns, or complaints.   Cancer Staging Metastatic colorectal [facility] ([facility]: Colon and Rectum, AJCC 8th Edition - Clinical stage from 05/31/2020: Stage IVC (cTX, cNX, pM1c) - Signed by FiLloyd HugerMD on 05/31/2020   TiLloyd HugerMD   06/21/2020 6:58 AM

## 2020-06-14 NOTE — Progress Notes (Signed)
ON PATHWAY REGIMEN - Colorectal  No Change  Continue With Treatment as Ordered.  Original Decision Date/Time: 06/02/2020 18:19     A cycle is every 14 days:     Bevacizumab-xxxx      Oxaliplatin      Leucovorin      Fluorouracil      Fluorouracil   **Always confirm dose/schedule in your pharmacy ordering system**  Patient Characteristics: Distant Metastases, Nonsurgical Candidate, KRAS/NRAS Mutation Positive/Unknown (BRAF V600 Wild-Type/Unknown), Standard Cytotoxic Therapy, First Line Standard Cytotoxic Therapy, Bevacizumab Eligible, PS = 0,1 Tumor Location: Colon Therapeutic Status: Distant Metastases Microsatellite/Mismatch Repair Status: Unknown BRAF Mutation Status: Awaiting Test Results KRAS/NRAS Mutation Status: Awaiting Test Results Standard Cytotoxic Line of Therapy: First Line Standard Cytotoxic Therapy ECOG Performance Status: 0 Bevacizumab Eligibility: Eligible Intent of Therapy: Non-Curative / Palliative Intent, Discussed with Patient

## 2020-06-16 ENCOUNTER — Encounter: Payer: Self-pay | Admitting: Oncology

## 2020-06-16 LAB — SURGICAL PATHOLOGY

## 2020-06-18 ENCOUNTER — Inpatient Hospital Stay: Payer: Medicaid Other

## 2020-06-18 ENCOUNTER — Other Ambulatory Visit: Payer: Self-pay

## 2020-06-18 ENCOUNTER — Encounter: Payer: Self-pay | Admitting: Oncology

## 2020-06-18 ENCOUNTER — Ambulatory Visit: Payer: Self-pay | Admitting: Oncology

## 2020-06-18 ENCOUNTER — Inpatient Hospital Stay (HOSPITAL_BASED_OUTPATIENT_CLINIC_OR_DEPARTMENT_OTHER): Payer: Medicaid Other | Admitting: Oncology

## 2020-06-18 VITALS — BP 97/61 | HR 71 | Resp 16

## 2020-06-18 VITALS — BP 114/77 | HR 96 | Temp 96.9°F | Wt 132.0 lb

## 2020-06-18 DIAGNOSIS — C19 Malignant neoplasm of rectosigmoid junction: Secondary | ICD-10-CM | POA: Diagnosis not present

## 2020-06-18 LAB — CBC WITH DIFFERENTIAL/PLATELET
Abs Immature Granulocytes: 0.01 10*3/uL (ref 0.00–0.07)
Basophils Absolute: 0 10*3/uL (ref 0.0–0.1)
Basophils Relative: 1 %
Eosinophils Absolute: 0.1 10*3/uL (ref 0.0–0.5)
Eosinophils Relative: 2 %
HCT: 27.8 % — ABNORMAL LOW (ref 36.0–46.0)
Hemoglobin: 9 g/dL — ABNORMAL LOW (ref 12.0–15.0)
Immature Granulocytes: 0 %
Lymphocytes Relative: 49 %
Lymphs Abs: 1.5 10*3/uL (ref 0.7–4.0)
MCH: 26.9 pg (ref 26.0–34.0)
MCHC: 32.4 g/dL (ref 30.0–36.0)
MCV: 83.2 fL (ref 80.0–100.0)
Monocytes Absolute: 0.5 10*3/uL (ref 0.1–1.0)
Monocytes Relative: 15 %
Neutro Abs: 1.1 10*3/uL — ABNORMAL LOW (ref 1.7–7.7)
Neutrophils Relative %: 33 %
Platelets: 253 10*3/uL (ref 150–400)
RBC: 3.34 MIL/uL — ABNORMAL LOW (ref 3.87–5.11)
RDW: 19.6 % — ABNORMAL HIGH (ref 11.5–15.5)
WBC: 3.1 10*3/uL — ABNORMAL LOW (ref 4.0–10.5)
nRBC: 0 % (ref 0.0–0.2)

## 2020-06-18 LAB — COMPREHENSIVE METABOLIC PANEL
ALT: 9 U/L (ref 0–44)
AST: 15 U/L (ref 15–41)
Albumin: 3 g/dL — ABNORMAL LOW (ref 3.5–5.0)
Alkaline Phosphatase: 51 U/L (ref 38–126)
Anion gap: 10 (ref 5–15)
BUN: 14 mg/dL (ref 6–20)
CO2: 23 mmol/L (ref 22–32)
Calcium: 8.6 mg/dL — ABNORMAL LOW (ref 8.9–10.3)
Chloride: 105 mmol/L (ref 98–111)
Creatinine, Ser: 1.26 mg/dL — ABNORMAL HIGH (ref 0.44–1.00)
GFR, Estimated: 51 mL/min — ABNORMAL LOW (ref >60)
Glucose, Bld: 109 mg/dL — ABNORMAL HIGH (ref 70–99)
Potassium: 3.3 mmol/L — ABNORMAL LOW (ref 3.5–5.1)
Sodium: 138 mmol/L (ref 135–145)
Total Bilirubin: 0.6 mg/dL (ref 0.3–1.2)
Total Protein: 6.9 g/dL (ref 6.5–8.1)

## 2020-06-18 LAB — URINALYSIS, DIPSTICK ONLY
Bilirubin Urine: NEGATIVE
Glucose, UA: NEGATIVE mg/dL
Ketones, ur: NEGATIVE mg/dL
Nitrite: NEGATIVE
Protein, ur: 30 mg/dL — AB
Specific Gravity, Urine: 1.014 (ref 1.005–1.030)
pH: 5 (ref 5.0–8.0)

## 2020-06-18 MED ORDER — DEXTROSE 5 % IV SOLN
Freq: Once | INTRAVENOUS | Status: AC
  Filled 2020-06-18: qty 250

## 2020-06-18 MED ORDER — PROCHLORPERAZINE MALEATE 10 MG PO TABS
10.0000 mg | ORAL_TABLET | Freq: Four times a day (QID) | ORAL | Status: DC | PRN
  Administered 2020-06-18: 10 mg via ORAL
  Filled 2020-06-18: qty 1

## 2020-06-18 MED ORDER — LEUCOVORIN CALCIUM INJECTION 100 MG
20.0000 mg/m2 | Freq: Once | INTRAMUSCULAR | Status: AC
  Administered 2020-06-18: 32 mg via INTRAVENOUS
  Filled 2020-06-18: qty 1.6

## 2020-06-18 MED ORDER — SODIUM CHLORIDE 0.9 % IV SOLN
10.0000 mg | Freq: Once | INTRAVENOUS | Status: AC
  Administered 2020-06-18: 10 mg via INTRAVENOUS
  Filled 2020-06-18: qty 10

## 2020-06-18 MED ORDER — OXALIPLATIN CHEMO INJECTION 100 MG/20ML
85.0000 mg/m2 | Freq: Once | INTRAVENOUS | Status: AC
  Administered 2020-06-18: 135 mg via INTRAVENOUS
  Filled 2020-06-18: qty 20

## 2020-06-18 MED ORDER — LEUCOVORIN CALCIUM INJECTION 350 MG: 400.0000 mg/m2 | Freq: Once | INTRAVENOUS | Status: DC

## 2020-06-18 MED ORDER — PALONOSETRON HCL INJECTION 0.25 MG/5ML
0.2500 mg | Freq: Once | INTRAVENOUS | Status: AC
  Administered 2020-06-18: 0.25 mg via INTRAVENOUS
  Filled 2020-06-18: qty 5

## 2020-06-18 MED ORDER — SODIUM CHLORIDE 0.9% FLUSH
10.0000 mL | INTRAVENOUS | Status: DC | PRN
  Administered 2020-06-18: 10 mL via INTRAVENOUS
  Filled 2020-06-18: qty 10

## 2020-06-18 MED ORDER — SODIUM CHLORIDE 0.9 % IV SOLN
2400.0000 mg/m2 | INTRAVENOUS | Status: DC
  Administered 2020-06-18: 3750 mg via INTRAVENOUS
  Filled 2020-06-18: qty 75

## 2020-06-18 MED ORDER — FLUOROURACIL CHEMO INJECTION 2.5 GM/50ML
400.0000 mg/m2 | Freq: Once | INTRAVENOUS | Status: AC
  Administered 2020-06-18: 650 mg via INTRAVENOUS
  Filled 2020-06-18: qty 13

## 2020-06-18 NOTE — Progress Notes (Signed)
At approx. 1245, patient came back from bathroom, after finishing Oxaliplatin, c/o rectal bleeding, slight lower abdominal pain, and nausea. Patient reports rectal bleeding is not excessive but reports it is "dripping". Dr. Grayland Ormond made aware. Okay to continue with rest of treatment and verbal order given for Compazine 10 MG PO Once. Patient educated as to when to seek emergency care and to contact clinic with any questions or concerns. Patient verbalizes understanding and denies any further questions or concerns. Patient and VSS. Discharged home. Daughter is to transport patient.

## 2020-06-19 ENCOUNTER — Other Ambulatory Visit: Payer: Self-pay | Admitting: *Deleted

## 2020-06-19 ENCOUNTER — Other Ambulatory Visit: Payer: Self-pay | Admitting: Oncology

## 2020-06-19 DIAGNOSIS — C19 Malignant neoplasm of rectosigmoid junction: Secondary | ICD-10-CM

## 2020-06-20 ENCOUNTER — Other Ambulatory Visit: Payer: Self-pay

## 2020-06-20 ENCOUNTER — Inpatient Hospital Stay: Payer: Medicaid Other

## 2020-06-20 DIAGNOSIS — C19 Malignant neoplasm of rectosigmoid junction: Secondary | ICD-10-CM

## 2020-06-20 MED ORDER — SODIUM CHLORIDE 0.9% FLUSH
10.0000 mL | INTRAVENOUS | Status: DC | PRN
  Administered 2020-06-20: 10 mL
  Filled 2020-06-20: qty 10

## 2020-06-20 MED ORDER — HEPARIN SOD (PORK) LOCK FLUSH 100 UNIT/ML IV SOLN
500.0000 [IU] | Freq: Once | INTRAVENOUS | Status: AC | PRN
  Administered 2020-06-20: 500 [IU]
  Filled 2020-06-20: qty 5

## 2020-06-20 NOTE — Progress Notes (Signed)
Patient pump removed, no concerns voiced. Patient discharged. Stable.

## 2020-06-23 NOTE — Progress Notes (Signed)
..  The following Medication: Mvasi is approved for drug replacement program by [place]. The enrollment period is from 06/20/2020 to 06/20/2021.  Reason for Assistance: Self Pay. ID: [id number] First DOS:07/02/2020.

## 2020-06-25 NOTE — Progress Notes (Signed)
[place]  [phone] [phone]) [phone]  ID: [name] OB: 13-May-1966  MR#: [id number]  OMB#:[id number]  Patient Care Team: Volney American, PA-C as PCP - General (Family Medicine) [name], MD as Consulting Physician (Oncology) [name], RN as Oncology Nurse Navigator  CHIEF COMPLAINT: Metastatic colorectal adenocarcinoma, KRAS wild-type  INTERVAL HISTORY: Patient returns to clinic today for further evaluation and consideration of cycle 3 of FOLFOX.  She has increased sinus drainage and fatigue, but denies fever.  She continues to have occasional rectal bleeding.  She has no neurologic complaints.  She has a fair appetite, but denies weight loss.  She denies any chest pain, shortness of breath, cough, or hemoptysis.  She denies any nausea, vomiting, constipation, or diarrhea.  She has no urinary complaints.  Patient offers no further specific complaints today.  REVIEW OF SYSTEMS:   Review of Systems  Constitutional: Positive for malaise/fatigue. Negative for fever and weight loss.  HENT: Positive for congestion.   Respiratory: Negative.  Negative for cough, hemoptysis and shortness of breath.   Cardiovascular: Negative.  Negative for chest pain and leg swelling.  Gastrointestinal: Positive for blood in stool. Negative for constipation, nausea and vomiting.  Genitourinary: Negative.  Negative for hematuria.  Musculoskeletal: Negative.  Negative for back pain.  Skin: Negative.  Negative for rash.  Neurological: Negative.  Negative for dizziness, seizures, weakness and headaches.  Psychiatric/Behavioral: Negative.  The patient is not nervous/anxious.     As per HPI. Otherwise, a complete review of systems is negative.  PAST MEDICAL HISTORY: Past Medical History:  Diagnosis Date  . Colon cancer ([place])   . Family history of pancreatic cancer   . Family history of uterine cancer     PAST SURGICAL HISTORY: Past  Surgical History:  Procedure Laterality Date  . COLONOSCOPY WITH PROPOFOL N/A 05/27/2020   Procedure: COLONOSCOPY WITH PROPOFOL;  Surgeon: [name], MD;  Location[facility] ENDOSCOPY;  Service: Gastroenterology;  Laterality: N/A;  . FLEXIBLE SIGMOIDOSCOPY N/A 05/28/2020   Procedure: FLEXIBLE SIGMOIDOSCOPY;  Surgeon: [name], MD;  Location: [facility] ENDOSCOPY;  Service: Gastroenterology;  Laterality: N/A;  . IR NEPHROSTOMY PLACEMENT LEFT  05/23/2020  . IR NEPHROSTOMY PLACEMENT RIGHT  05/23/2020  . PORTACATH PLACEMENT Left 05/29/2020   Procedure: INSERTION PORT-A-CATH;  Surgeon: [name], MD;  Location: [facility] ORS;  Service: General;  Laterality: Left;    FAMILY HISTORY: Family History  Problem Relation Age of Onset  . Other Mother        Corticobasal degeneration  . Uterine cancer Mother   . Diabetes Maternal Grandmother   . Stroke Maternal Grandfather   . Stroke Paternal Grandfather   . Heart disease Father   . Pancreatic cancer Maternal Uncle 55  . Cancer Paternal Aunt        unk type  . Liver cancer Maternal Uncle   . Pancreatic cancer Cousin   . Cancer Cousin        unk type    ADVANCED DIRECTIVES (Y/N):  N  HEALTH MAINTENANCE: Social History   Tobacco Use  . Smoking status: Never Smoker  . Smokeless tobacco: Never Used  Vaping Use  . Vaping Use: Never used  Substance Use Topics  . Alcohol use: Never  . Drug use: Never     Colonoscopy:  PAP:  Bone density:  Lipid panel:  No Known Allergies  Current Outpatient Medications  Medication Sig Dispense Refill  . ondansetron (ZOFRAN) 4 MG tablet Take  4 mg by mouth every 8 (eight) hours as needed for nausea or vomiting.    . pantoprazole (PROTONIX) 40 MG tablet Take 1 tablet (40 mg total) by mouth 2 (two) times daily. 60 tablet 0  . levofloxacin (LEVAQUIN) 500 MG tablet Take 1 tablet (500 mg total) by mouth daily. 7 tablet 0   No current facility-administered medications for this visit.     OBJECTIVE: Vitals:   07/02/20 0940  BP: 99/68  Pulse: 100  Resp: 18  Temp: 100.3 F (37.9 C)  SpO2: 100%     Body mass index is 25.25 kg/m.    ECOG FS:1 - Symptomatic but completely ambulatory  General: Well-developed, well-nourished, no acute distress. Eyes: Pink conjunctiva, anicteric sclera. HEENT: Normocephalic, moist mucous membranes. Lungs: No audible wheezing or coughing. Heart: Regular rate and rhythm. Abdomen: Soft, nontender, no obvious distention. Musculoskeletal: No edema, cyanosis, or clubbing. Neuro: Alert, answering all questions appropriately. Cranial nerves grossly intact. Skin: No rashes or petechiae noted. Psych: Normal affect.  LAB RESULTS:  Lab Results  Component Value Date   NA 133 (L) 07/02/2020   K 4.2 07/02/2020   CL 98 07/02/2020   CO2 24 07/02/2020   GLUCOSE 98 07/02/2020   BUN 16 07/02/2020   CREATININE 1.21 (H) 07/02/2020   CALCIUM 8.4 (L) 07/02/2020   PROT 7.3 07/02/2020   ALBUMIN 3.0 (L) 07/02/2020   AST 25 07/02/2020   ALT 13 07/02/2020   ALKPHOS 42 07/02/2020   BILITOT 0.7 07/02/2020   GFRNONAA 54 (L) 07/02/2020    Lab Results  Component Value Date   WBC 1.9 (L) 07/02/2020   NEUTROABS 0.4 (LL) 07/02/2020   HGB 8.8 (L) 07/02/2020   HCT 27.5 (L) 07/02/2020   MCV 86.8 07/02/2020   PLT 200 07/02/2020     STUDIES: No results found.  ASSESSMENT: Metastatic colorectal adenocarcinoma,  KRAS wild-type.  PLAN:   1. Metastatic colorectal adenocarcinoma: Although unclear whether malignancy originated in colon or rectum, given the pattern of spread to liver, will treat as a stage IV colon cancer.  CEA is only mildly elevated at 9.9.  Patient has had port placement.  Plan to treat with FOLFOX plus Avastin every 2 weeks.  Can consider Panitumumab as second line.  Continue to hold Avastin given continued rectal bleeding.  Delay cycle 3 of treatment today secondary to neutropenia.  We will add in [place] for the remainder of the  cycles if needed.  Return to clinic in 1 week for further evaluation and reconsideration of cycle 3.   Appreciate palliative care and genetics input.  2.  Anemia: Hemoglobin 8.8.  Continue to hold Avastin as above. 3.  Renal insufficiency: Improving.  Patient's creatinine is 1.21.  She has an appointment with interventional radiology on July 11, 2020. 4.  Neutropenia: Delay treatment as above.  Aji Donika for remainder of cycles if needed. 5. Hypokalemia: Resolved. 6.  Congestion/sinus drainage: Patient was given a prescription for Augmentin. 7.  Hypotension: Patient will receive 1 L of IV fluids instead of treatment today.  Patient expressed understanding and was in agreement with this plan. She also understands that She can call clinic at any time with any questions, concerns, or complaints.   Cancer Staging Metastatic colorectal cancer [facility]) Staging form: Colon and Rectum, AJCC 8th Edition - Clinical stage from 05/31/2020: Stage IVC (cTX, cNX, pM1c) - Signed by [name], MD on 05/31/2020   [name], MD   07/03/2020 6:16 AM

## 2020-06-30 NOTE — Progress Notes (Signed)
..  The following Assist/Replace Program for Mvasi from [place] has been terminated due to Medicaid coverage starting 06/02/2020.  Last YBO:FBPZ- medication not started yet.

## 2020-07-02 ENCOUNTER — Encounter: Payer: Self-pay | Admitting: Oncology

## 2020-07-02 ENCOUNTER — Other Ambulatory Visit: Payer: Self-pay

## 2020-07-02 ENCOUNTER — Inpatient Hospital Stay (HOSPITAL_BASED_OUTPATIENT_CLINIC_OR_DEPARTMENT_OTHER): Payer: Medicaid Other | Admitting: Oncology

## 2020-07-02 ENCOUNTER — Inpatient Hospital Stay: Payer: Medicaid Other

## 2020-07-02 ENCOUNTER — Inpatient Hospital Stay: Payer: Medicaid Other | Attending: Oncology

## 2020-07-02 ENCOUNTER — Encounter (INDEPENDENT_AMBULATORY_CARE_PROVIDER_SITE_OTHER): Payer: Self-pay

## 2020-07-02 VITALS — BP 99/68 | HR 100 | Temp 100.3°F | Resp 18 | Wt 129.3 lb

## 2020-07-02 DIAGNOSIS — I959 Hypotension, unspecified: Secondary | ICD-10-CM | POA: Insufficient documentation

## 2020-07-02 DIAGNOSIS — Z8049 Family history of malignant neoplasm of other genital organs: Secondary | ICD-10-CM | POA: Insufficient documentation

## 2020-07-02 DIAGNOSIS — Z809 Family history of malignant neoplasm, unspecified: Secondary | ICD-10-CM | POA: Diagnosis not present

## 2020-07-02 DIAGNOSIS — Z823 Family history of stroke: Secondary | ICD-10-CM | POA: Diagnosis not present

## 2020-07-02 DIAGNOSIS — T451X5A Adverse effect of antineoplastic and immunosuppressive drugs, initial encounter: Secondary | ICD-10-CM | POA: Diagnosis not present

## 2020-07-02 DIAGNOSIS — C19 Malignant neoplasm of rectosigmoid junction: Secondary | ICD-10-CM

## 2020-07-02 DIAGNOSIS — Z79899 Other long term (current) drug therapy: Secondary | ICD-10-CM | POA: Diagnosis not present

## 2020-07-02 DIAGNOSIS — Z833 Family history of diabetes mellitus: Secondary | ICD-10-CM | POA: Diagnosis not present

## 2020-07-02 DIAGNOSIS — Z5189 Encounter for other specified aftercare: Secondary | ICD-10-CM | POA: Insufficient documentation

## 2020-07-02 DIAGNOSIS — Z800 Family history of malignant neoplasm of digestive organs: Secondary | ICD-10-CM | POA: Insufficient documentation

## 2020-07-02 DIAGNOSIS — K625 Hemorrhage of anus and rectum: Secondary | ICD-10-CM | POA: Insufficient documentation

## 2020-07-02 DIAGNOSIS — R5383 Other fatigue: Secondary | ICD-10-CM | POA: Diagnosis not present

## 2020-07-02 DIAGNOSIS — Z8249 Family history of ischemic heart disease and other diseases of the circulatory system: Secondary | ICD-10-CM | POA: Diagnosis not present

## 2020-07-02 DIAGNOSIS — D649 Anemia, unspecified: Secondary | ICD-10-CM | POA: Diagnosis not present

## 2020-07-02 DIAGNOSIS — N289 Disorder of kidney and ureter, unspecified: Secondary | ICD-10-CM | POA: Insufficient documentation

## 2020-07-02 DIAGNOSIS — Z5111 Encounter for antineoplastic chemotherapy: Secondary | ICD-10-CM | POA: Insufficient documentation

## 2020-07-02 DIAGNOSIS — D701 Agranulocytosis secondary to cancer chemotherapy: Secondary | ICD-10-CM | POA: Diagnosis not present

## 2020-07-02 LAB — CBC WITH DIFFERENTIAL/PLATELET
Abs Immature Granulocytes: 0 10*3/uL (ref 0.00–0.07)
Basophils Absolute: 0 10*3/uL (ref 0.0–0.1)
Basophils Relative: 1 %
Eosinophils Absolute: 0 10*3/uL (ref 0.0–0.5)
Eosinophils Relative: 1 %
HCT: 27.5 % — ABNORMAL LOW (ref 36.0–46.0)
Hemoglobin: 8.8 g/dL — ABNORMAL LOW (ref 12.0–15.0)
Immature Granulocytes: 0 %
Lymphocytes Relative: 41 %
Lymphs Abs: 0.8 10*3/uL (ref 0.7–4.0)
MCH: 27.8 pg (ref 26.0–34.0)
MCHC: 32 g/dL (ref 30.0–36.0)
MCV: 86.8 fL (ref 80.0–100.0)
Monocytes Absolute: 0.7 10*3/uL (ref 0.1–1.0)
Monocytes Relative: 38 %
Neutro Abs: 0.4 10*3/uL — CL (ref 1.7–7.7)
Neutrophils Relative %: 19 %
Platelets: 200 10*3/uL (ref 150–400)
RBC: 3.17 MIL/uL — ABNORMAL LOW (ref 3.87–5.11)
RDW: 21.1 % — ABNORMAL HIGH (ref 11.5–15.5)
Smear Review: ADEQUATE
WBC: 1.9 10*3/uL — ABNORMAL LOW (ref 4.0–10.5)
nRBC: 0 % (ref 0.0–0.2)

## 2020-07-02 LAB — COMPREHENSIVE METABOLIC PANEL
ALT: 13 U/L (ref 0–44)
AST: 25 U/L (ref 15–41)
Albumin: 3 g/dL — ABNORMAL LOW (ref 3.5–5.0)
Alkaline Phosphatase: 42 U/L (ref 38–126)
Anion gap: 11 (ref 5–15)
BUN: 16 mg/dL (ref 6–20)
CO2: 24 mmol/L (ref 22–32)
Calcium: 8.4 mg/dL — ABNORMAL LOW (ref 8.9–10.3)
Chloride: 98 mmol/L (ref 98–111)
Creatinine, Ser: 1.21 mg/dL — ABNORMAL HIGH (ref 0.44–1.00)
GFR, Estimated: 54 mL/min — ABNORMAL LOW (ref >60)
Glucose, Bld: 98 mg/dL (ref 70–99)
Potassium: 4.2 mmol/L (ref 3.5–5.1)
Sodium: 133 mmol/L — ABNORMAL LOW (ref 135–145)
Total Bilirubin: 0.7 mg/dL (ref 0.3–1.2)
Total Protein: 7.3 g/dL (ref 6.5–8.1)

## 2020-07-02 LAB — URINALYSIS, DIPSTICK ONLY
Bilirubin Urine: NEGATIVE
Glucose, UA: NEGATIVE mg/dL
Ketones, ur: NEGATIVE mg/dL
Leukocytes,Ua: NEGATIVE
Nitrite: NEGATIVE
Protein, ur: 100 mg/dL — AB
Specific Gravity, Urine: 1.013 (ref 1.005–1.030)
pH: 7 (ref 5.0–8.0)

## 2020-07-02 MED ORDER — SODIUM CHLORIDE 0.9 % IV SOLN
Freq: Once | INTRAVENOUS | Status: AC
  Filled 2020-07-02: qty 250

## 2020-07-02 MED ORDER — HEPARIN SOD (PORK) LOCK FLUSH 100 UNIT/ML IV SOLN
500.0000 [IU] | Freq: Once | INTRAVENOUS | Status: AC
  Administered 2020-07-02: 500 [IU] via INTRAVENOUS
  Filled 2020-07-02: qty 5

## 2020-07-02 MED ORDER — HEPARIN SOD (PORK) LOCK FLUSH 100 UNIT/ML IV SOLN
INTRAVENOUS | Status: AC
  Filled 2020-07-02: qty 5

## 2020-07-02 MED ORDER — LEVOFLOXACIN 500 MG PO TABS: 500.0000 mg | ORAL_TABLET | Freq: Every day | ORAL | 0 refills | Status: DC

## 2020-07-02 NOTE — Progress Notes (Signed)
No treatment today. One liter IVF given. Patient stable at discharge

## 2020-07-04 ENCOUNTER — Inpatient Hospital Stay: Payer: Medicaid Other

## 2020-07-08 NOTE — Progress Notes (Signed)
Patient on schedule for bilateral nephrostomy tube exchange 12/10, spoke with patient on phone, made aware to be here @ 0800, stated understanding.

## 2020-07-09 ENCOUNTER — Other Ambulatory Visit: Payer: Self-pay

## 2020-07-09 ENCOUNTER — Inpatient Hospital Stay: Payer: Medicaid Other

## 2020-07-09 ENCOUNTER — Inpatient Hospital Stay (HOSPITAL_BASED_OUTPATIENT_CLINIC_OR_DEPARTMENT_OTHER): Payer: Medicaid Other | Admitting: Oncology

## 2020-07-09 ENCOUNTER — Encounter: Payer: Self-pay | Admitting: Oncology

## 2020-07-09 VITALS — BP 97/62 | HR 82 | Temp 96.8°F | Resp 18 | Wt 133.3 lb

## 2020-07-09 DIAGNOSIS — C19 Malignant neoplasm of rectosigmoid junction: Secondary | ICD-10-CM

## 2020-07-09 DIAGNOSIS — Z5111 Encounter for antineoplastic chemotherapy: Secondary | ICD-10-CM | POA: Diagnosis not present

## 2020-07-09 LAB — CBC WITH DIFFERENTIAL/PLATELET
Abs Immature Granulocytes: 0.07 10*3/uL (ref 0.00–0.07)
Basophils Absolute: 0 10*3/uL (ref 0.0–0.1)
Basophils Relative: 0 %
Eosinophils Absolute: 0.1 10*3/uL (ref 0.0–0.5)
Eosinophils Relative: 1 %
HCT: 26.6 % — ABNORMAL LOW (ref 36.0–46.0)
Hemoglobin: 8.6 g/dL — ABNORMAL LOW (ref 12.0–15.0)
Immature Granulocytes: 1 %
Lymphocytes Relative: 30 %
Lymphs Abs: 1.6 10*3/uL (ref 0.7–4.0)
MCH: 28.9 pg (ref 26.0–34.0)
MCHC: 32.3 g/dL (ref 30.0–36.0)
MCV: 89.3 fL (ref 80.0–100.0)
Monocytes Absolute: 0.5 10*3/uL (ref 0.1–1.0)
Monocytes Relative: 8 %
Neutro Abs: 3.3 10*3/uL (ref 1.7–7.7)
Neutrophils Relative %: 60 %
Platelets: 230 10*3/uL (ref 150–400)
RBC: 2.98 MIL/uL — ABNORMAL LOW (ref 3.87–5.11)
RDW: 22.6 % — ABNORMAL HIGH (ref 11.5–15.5)
WBC: 5.5 10*3/uL (ref 4.0–10.5)
nRBC: 0 % (ref 0.0–0.2)

## 2020-07-09 LAB — COMPREHENSIVE METABOLIC PANEL
ALT: 10 U/L (ref 0–44)
AST: 20 U/L (ref 15–41)
Albumin: 2.6 g/dL — ABNORMAL LOW (ref 3.5–5.0)
Alkaline Phosphatase: 43 U/L (ref 38–126)
Anion gap: 9 (ref 5–15)
BUN: 13 mg/dL (ref 6–20)
CO2: 26 mmol/L (ref 22–32)
Calcium: 8.3 mg/dL — ABNORMAL LOW (ref 8.9–10.3)
Chloride: 103 mmol/L (ref 98–111)
Creatinine, Ser: 1.15 mg/dL — ABNORMAL HIGH (ref 0.44–1.00)
GFR, Estimated: 57 mL/min — ABNORMAL LOW (ref >60)
Glucose, Bld: 88 mg/dL (ref 70–99)
Potassium: 3.9 mmol/L (ref 3.5–5.1)
Sodium: 138 mmol/L (ref 135–145)
Total Bilirubin: 0.4 mg/dL (ref 0.3–1.2)
Total Protein: 6.4 g/dL — ABNORMAL LOW (ref 6.5–8.1)

## 2020-07-09 LAB — FERRITIN: Ferritin: 128 ng/mL (ref 11–307)

## 2020-07-09 LAB — URINALYSIS, DIPSTICK ONLY
Bilirubin Urine: NEGATIVE
Glucose, UA: NEGATIVE mg/dL
Hgb urine dipstick: NEGATIVE
Ketones, ur: NEGATIVE mg/dL
Nitrite: NEGATIVE
Protein, ur: NEGATIVE mg/dL
Specific Gravity, Urine: 1.003 — ABNORMAL LOW (ref 1.005–1.030)
pH: 8 (ref 5.0–8.0)

## 2020-07-09 LAB — IRON AND TIBC
Iron: 41 ug/dL (ref 28–170)
Saturation Ratios: 21 % (ref 10.4–31.8)
TIBC: 200 ug/dL — ABNORMAL LOW (ref 250–450)
UIBC: 159 ug/dL

## 2020-07-09 MED ORDER — LEUCOVORIN CALCIUM INJECTION 350 MG
650.0000 mg | Freq: Once | INTRAVENOUS | Status: AC
  Administered 2020-07-09: 650 mg via INTRAVENOUS
  Filled 2020-07-09: qty 32.5

## 2020-07-09 MED ORDER — DEXTROSE 5 % IV SOLN
Freq: Once | INTRAVENOUS | Status: AC
  Filled 2020-07-09: qty 250

## 2020-07-09 MED ORDER — SODIUM CHLORIDE 0.9 % IV SOLN
2400.0000 mg/m2 | INTRAVENOUS | Status: DC
  Administered 2020-07-09: 3750 mg via INTRAVENOUS
  Filled 2020-07-09: qty 75

## 2020-07-09 MED ORDER — PALONOSETRON HCL INJECTION 0.25 MG/5ML
0.2500 mg | Freq: Once | INTRAVENOUS | Status: AC
  Administered 2020-07-09: 0.25 mg via INTRAVENOUS
  Filled 2020-07-09: qty 5

## 2020-07-09 MED ORDER — FLUOROURACIL CHEMO INJECTION 2.5 GM/50ML
400.0000 mg/m2 | Freq: Once | INTRAVENOUS | Status: AC
  Administered 2020-07-09: 650 mg via INTRAVENOUS
  Filled 2020-07-09: qty 13

## 2020-07-09 MED ORDER — SODIUM CHLORIDE 0.9 % IV SOLN
10.0000 mg | Freq: Once | INTRAVENOUS | Status: AC
  Administered 2020-07-09: 10 mg via INTRAVENOUS
  Filled 2020-07-09: qty 10

## 2020-07-09 MED ORDER — SODIUM CHLORIDE 0.9% FLUSH
10.0000 mL | INTRAVENOUS | Status: DC | PRN
  Administered 2020-07-09: 10 mL via INTRAVENOUS
  Filled 2020-07-09: qty 10

## 2020-07-09 MED ORDER — OXALIPLATIN CHEMO INJECTION 100 MG/20ML
85.0000 mg/m2 | Freq: Once | INTRAVENOUS | Status: AC
  Administered 2020-07-09: 135 mg via INTRAVENOUS
  Filled 2020-07-09: qty 20

## 2020-07-09 NOTE — Progress Notes (Signed)
[place]  Telephone:([phone] [phone]) [phone]  ID: Anita Sullivan OB: June 01, 1966  MR#: [id number]  ZRA#:[id number]  Patient Care Team: Volney American, PA-C as PCP - General (Family Medicine) [name], MD as Consulting Physician (Oncology) [name], RN as Oncology Nurse Navigator  CHIEF COMPLAINT: Metastatic colorectal adenocarcinoma, KRAS wild-type  INTERVAL HISTORY: Patient returns to clinic today for further evaluation and re-consideration of cycle 3 of FOLFOX.  Her sinus congestion, low-grade temperature and drainage has almost completely resolved.  She has 1 more day of Levaquin.  Endorses improvement of her rectal bleeding.  She has not noticed any blood over the past couple days.  She continues to work daily typically just in the morning.  She feels exhausted after her shifts. She has no neurologic complaints.  She has a fair appetite, but denies weight loss.  She denies any chest pain, shortness of breath, cough, or hemoptysis.  She denies any nausea, vomiting, constipation, or diarrhea.  She has no urinary complaints.  Patient offers no further specific complaints today.  REVIEW OF SYSTEMS:   Review of Systems  Constitutional: Positive for malaise/fatigue. Negative for fever and weight loss.  HENT: Negative for congestion.   Respiratory: Negative.  Negative for cough, hemoptysis and shortness of breath.   Cardiovascular: Negative.  Negative for chest pain and leg swelling.  Gastrointestinal: Positive for blood in stool. Negative for constipation, nausea and vomiting.  Genitourinary: Negative.  Negative for hematuria.  Musculoskeletal: Negative.  Negative for back pain.  Skin: Negative.  Negative for rash.  Neurological: Positive for weakness. Negative for dizziness, seizures and headaches.  Psychiatric/Behavioral: Negative.  The patient is not nervous/anxious.     As per HPI. Otherwise, a complete review of systems is  negative.  PAST MEDICAL HISTORY: Past Medical History:  Diagnosis Date  . Colon cancer ([place])   . Family history of pancreatic cancer   . Family history of uterine cancer     PAST SURGICAL HISTORY: Past Surgical History:  Procedure Laterality Date  . COLONOSCOPY WITH PROPOFOL N/A 05/27/2020   Procedure: COLONOSCOPY WITH PROPOFOL;  Surgeon: [name], MD;  Location: [facility] ENDOSCOPY;  Service: Gastroenterology;  Laterality: N/A;  . FLEXIBLE SIGMOIDOSCOPY N/A 05/28/2020   Procedure: FLEXIBLE SIGMOIDOSCOPY;  Surgeon: [name], MD;  Location: [facility] ENDOSCOPY;  Service: Gastroenterology;  Laterality: N/A;  . IR NEPHROSTOMY PLACEMENT LEFT  05/23/2020  . IR NEPHROSTOMY PLACEMENT RIGHT  05/23/2020  . PORTACATH PLACEMENT Left 05/29/2020   Procedure: INSERTION PORT-A-CATH;  Surgeon: [name], MD;  Location: [facility] ORS;  Service: General;  Laterality: Left;    FAMILY HISTORY: Family History  Problem Relation Age of Onset  . Other Mother        Corticobasal degeneration  . Uterine cancer Mother   . Diabetes Maternal Grandmother   . Stroke Maternal Grandfather   . Stroke Paternal Grandfather   . Heart disease Father   . Pancreatic cancer Maternal Uncle 46  . Cancer Paternal Aunt        unk type  . Liver cancer Maternal Uncle   . Pancreatic cancer Cousin   . Cancer Cousin        unk type    ADVANCED DIRECTIVES (Y/N):  N  HEALTH MAINTENANCE: Social History   Tobacco Use  . Smoking status: Never Smoker  . Smokeless tobacco: Never Used  Vaping Use  . Vaping Use: Never used  Substance Use Topics  . Alcohol use: Never  .  Drug use: Never     Colonoscopy:  PAP:  Bone density:  Lipid panel:  No Known Allergies  Current Outpatient Medications  Medication Sig Dispense Refill  . levofloxacin (LEVAQUIN) 500 MG tablet Take 1 tablet (500 mg total) by mouth daily. 7 tablet 0  . ondansetron (ZOFRAN) 4 MG tablet Take 4 mg by mouth every 8 (eight) hours as  needed for nausea or vomiting.    . pantoprazole (PROTONIX) 40 MG tablet Take 1 tablet (40 mg total) by mouth 2 (two) times daily. 60 tablet 0   No current facility-administered medications for this visit.   Facility-Administered Medications Ordered in Other Visits  Medication Dose Route Frequency Provider Last Rate Last Admin  . sodium chloride flush (NS) 0.9 % injection 10 mL  10 mL Intravenous PRN Grayland Ormond, Kathlene November, MD        OBJECTIVE: There were no vitals filed for this visit.   There is no height or weight on file to calculate BMI.    ECOG FS:1 - Symptomatic but completely ambulatory  Physical Exam Constitutional:      Appearance: Normal appearance.  HENT:     Head: Normocephalic and atraumatic.  Eyes:     Pupils: Pupils are equal, round, and reactive to light.  Cardiovascular:     Rate and Rhythm: Normal rate and regular rhythm.     Heart sounds: Normal heart sounds. No murmur heard.   Pulmonary:     Effort: Pulmonary effort is normal.     Breath sounds: Normal breath sounds. No wheezing.  Abdominal:     General: Bowel sounds are normal. There is no distension.     Palpations: Abdomen is soft.     Tenderness: There is no abdominal tenderness.  Musculoskeletal:        General: Normal range of motion.     Cervical back: Normal range of motion.  Skin:    General: Skin is warm and dry.     Findings: No rash.  Neurological:     Mental Status: She is alert and oriented to person, place, and time.  Psychiatric:        Judgment: Judgment normal.     LAB RESULTS:  Lab Results  Component Value Date   NA 133 (L) 07/02/2020   K 4.2 07/02/2020   CL 98 07/02/2020   CO2 24 07/02/2020   GLUCOSE 98 07/02/2020   BUN 16 07/02/2020   CREATININE 1.21 (H) 07/02/2020   CALCIUM 8.4 (L) 07/02/2020   PROT 7.3 07/02/2020   ALBUMIN 3.0 (L) 07/02/2020   AST 25 07/02/2020   ALT 13 07/02/2020   ALKPHOS 42 07/02/2020   BILITOT 0.7 07/02/2020   GFRNONAA 54 (L) 07/02/2020     Lab Results  Component Value Date   WBC 1.9 (L) 07/02/2020   NEUTROABS 0.4 (LL) 07/02/2020   HGB 8.8 (L) 07/02/2020   HCT 27.5 (L) 07/02/2020   MCV 86.8 07/02/2020   PLT 200 07/02/2020     STUDIES: No results found.   Oncology history: Metastatic colorectal adenocarcinoma: Although unclear whether malignancy originated in colon or rectum, given the pattern of spread to liver, will treat as a stage IV colon cancer.  CEA is only mildly elevated at 9.9.  Patient has had port placement.  Plan to treat with FOLFOX plus Avastin every 2 weeks.  Can consider Panitumumab as second line.  Continue to hold Avastin given continued rectal bleeding.    ASSESSMENT: Metastatic colorectal adenocarcinoma,  KRAS wild-type.  PLAN:   1.  Metastatic colorectal adenocarcinoma: -Status post 2 cycles of FOLFOX.  -Avastin on hold secondary to rectal bleeding. -Cycle 3 postponed secondary to neutropenia. -Labs from 07/09/2020 show hemoglobin of 8.6, white count of 5.5 and an ANC of 3.3. -She will return to clinic in 2 weeks for consideration of cycle 4.  2.  Anemia:  -Hemoglobin 8.6.   -Continue to hold Avastin as above.  3.  Renal insufficiency:  -Baseline creatinine is 1.2.  -Today's creatinine is 1.15. -She has an appointment with interventional radiology on July 11, 2020.  4.  Neutropenia:  -ANC improved to 3.3, WBC 5.5 -Udenyca was added for remainder of cycles.  -She was instructed to take Tylenol and Claritin for at least 3 days after injection.  5. Hypokalemia: Resolved.  6.  Congestion/sinus drainage:  -Given a prescription for Augmentin last week. -She has 1 more day of antibiotics. -She feels improved. -No additional fevers.  7.  Fatigue: -Multifactorial. -We discussed chemotherapy along with low blood counts including a hemoglobin of 8.6 is likely causing her to feel so tired. -She was instructed to rest when needed.  -Given she is actively bleeding, would recommend  checking her ferritin and iron levels.  -We will call with results.  Disposition: -RTC in 2 days for pump removal and Udenyca. -RTC in 2 weeks for labs, cycle 4 and assessment.   Patient expressed understanding and was in agreement with this plan. She also understands that She can call clinic at any time with any questions, concerns, or complaints.   Cancer Staging Metastatic colorectal cancer [facility]) Staging form: Colon and Rectum, AJCC 8th Edition - Clinical stage from 05/31/2020: Stage IVC (cTX, cNX, pM1c) - Signed by [name], MD on 05/31/2020   Anita Hawking, NP   07/09/2020 8:15 AM

## 2020-07-09 NOTE — Progress Notes (Signed)
1343- Patient tolerated treatment well. Patient stable and discharged to home at this time with Fluorouracil infusion pump in place.

## 2020-07-10 ENCOUNTER — Encounter: Payer: Self-pay | Admitting: Licensed Clinical Social Worker

## 2020-07-10 ENCOUNTER — Telehealth: Payer: Self-pay | Admitting: Licensed Clinical Social Worker

## 2020-07-10 ENCOUNTER — Ambulatory Visit: Payer: Self-pay | Admitting: Licensed Clinical Social Worker

## 2020-07-10 DIAGNOSIS — Z1379 Encounter for other screening for genetic and chromosomal anomalies: Secondary | ICD-10-CM | POA: Insufficient documentation

## 2020-07-10 DIAGNOSIS — Z8049 Family history of malignant neoplasm of other genital organs: Secondary | ICD-10-CM

## 2020-07-10 DIAGNOSIS — Z800 Family history of malignant neoplasm of digestive organs: Secondary | ICD-10-CM

## 2020-07-10 DIAGNOSIS — C19 Malignant neoplasm of rectosigmoid junction: Secondary | ICD-10-CM

## 2020-07-10 NOTE — Progress Notes (Signed)
HPI:  [name] was previously seen in the [place] clinic due to a personal and family history of cancer and concerns regarding a hereditary predisposition to cancer. Please refer to our prior cancer genetics clinic note for more information regarding our discussion, assessment and recommendations, at the time. [name] recent genetic test results were disclosed to her, as were recommendations warranted by these results. These results and recommendations are discussed in more detail below.  CANCER HISTORY:  Oncology History  Metastatic colorectal cancer ([place])  05/28/2020 Initial Diagnosis   Metastatic colorectal cancer ([place])   05/31/2020 Cancer Staging   Staging form: Colon and Rectum, AJCC 8th Edition - Clinical stage from 05/31/2020: Stage IVC (cTX, cNX, pM1c) - Signed by [name], MD on 05/31/2020   06/04/2020 - 06/06/2020 Chemotherapy   The patient had palonosetron (ALOXI) injection 0.25 mg, 0.25 mg, Intravenous,  Once, 1 of 12 cycles Administration: 0.25 mg (06/04/2020) leucovorin 650 mg in dextrose 5 % 250 mL infusion, 632 mg, Intravenous,  Once, 1 of 12 cycles Administration: 650 mg (06/04/2020) oxaliplatin (ELOXATIN) 135 mg in dextrose 5 % 500 mL chemo infusion, 85 mg/m2 = 135 mg, Intravenous,  Once, 1 of 12 cycles Administration: 135 mg (06/04/2020) fluorouracil (ADRUCIL) chemo injection 650 mg, 400 mg/m2 = 650 mg, Intravenous,  Once, 1 of 12 cycles Administration: 650 mg (06/04/2020) fluorouracil (ADRUCIL) 3,800 mg in sodium chloride 0.9 % 74 mL chemo infusion, 2,400 mg/m2 = 3,800 mg, Intravenous, 1 Day/Dose, 1 of 12 cycles Administration: 3,800 mg (06/04/2020) bevacizumab-bvzr (ZIRABEV) 300 mg in sodium chloride 0.9 % 100 mL chemo infusion, 5 mg/kg = 300 mg, Intravenous,  Once, 0 of 11 cycles  for chemotherapy treatment.    06/18/2020 -  Chemotherapy   The patient had palonosetron (ALOXI) injection 0.25 mg, 0.25 mg, Intravenous,  Once, 2 of 11  cycles Administration: 0.25 mg (06/18/2020), 0.25 mg (07/09/2020) pegfilgrastim-cbqv (UDENYCA) injection 6 mg, 6 mg, Subcutaneous, Once, 1 of 10 cycles leucovorin 628 mg in dextrose 5 % 250 mL infusion, 400 mg/m2 = 628 mg, Intravenous,  Once, 2 of 11 cycles Administration: 650 mg (07/09/2020) oxaliplatin (ELOXATIN) 135 mg in dextrose 5 % 500 mL chemo infusion, 85 mg/m2 = 135 mg, Intravenous,  Once, 2 of 11 cycles Administration: 135 mg (06/18/2020), 135 mg (07/09/2020) fluorouracil (ADRUCIL) chemo injection 650 mg, 400 mg/m2 = 650 mg, Intravenous,  Once, 2 of 11 cycles Administration: 650 mg (06/18/2020), 650 mg (07/09/2020) fluorouracil (ADRUCIL) 3,750 mg in sodium chloride 0.9 % 75 mL chemo infusion, 2,400 mg/m2 = 3,750 mg, Intravenous, 1 Day/Dose, 2 of 11 cycles Administration: 3,750 mg (06/18/2020), 3,750 mg (07/09/2020) bevacizumab-awwb (MVASI) 300 mg in sodium chloride 0.9 % 100 mL chemo infusion, 5 mg/kg = 300 mg (100 % of original dose 5 mg/kg), Intravenous,  Once, 0 of 9 cycles Dose modification: 5 mg/kg (original dose 5 mg/kg, Cycle 3, Reason: Other (see comments), Comment: insurance )  for chemotherapy treatment.     Genetic Testing   Negative genetic testing. No pathogenic variants identified on the [facility] CustomNext-Cancer +RNA panel. The report date is 07/10/2020.  The CustomNext-Cancer + RNAinsight panel  includes sequencing and/or deletion duplication testing of the following 91 genes: AIP, ALK, APC*, ATM*, AXIN2, BAP1, BARD1, BLM, BMPR1A, BRCA1*, BRCA2*, BRIP1*, CDC73, CDH1*, CDK4, CDKN1B, CDKN2A, CHEK2*, CTNNA1, DICER1, FANCC, FH, FLCN, GALNT12, KIF1B, LZTR1, MAX, MEN1, MET, MLH1*, MRE11A, MSH2*, MSH3, MSH6*, MUTYH*, NBN, NF1*, NF2, NTHL1, PALB2*, PHOX2B, PMS2*, POT1, PRKAR1A, PTCH1, PTEN*, RAD50, RAD51C*,  RAD51D*, RB1, RECQL, RET, SDHA, SDHAF2, SDHB, SDHC, SDHD, SMAD4, SMARCA4, SMARCB1, SMARCE1, STK11, SUFU, TMEM127, TP53*, TSC1, TSC2, VHL and XRCC2 (sequencing and  deletion/duplication); CASR, CFTR, CPA1, CTRC, EGFR, EGLN1, FAM175A, HOXB13, KIT, MITF, MLH3, PALLD, PDGFRA, POLD1, POLE, PRSS1, RINT1, RPS20, SPINK1 and TERT (sequencing only); EPCAM and GREM1 (deletion/duplication only).      FAMILY HISTORY:  We obtained a detailed, 4-generation family history.  Significant diagnoses are listed below: Family History  Problem Relation Age of Onset  . Other Mother        Corticobasal degeneration  . Uterine cancer Mother   . Diabetes Maternal Grandmother   . Stroke Maternal Grandfather   . Stroke Paternal Grandfather   . Heart disease Father   . Pancreatic cancer Maternal Uncle 60  . Cancer Paternal Aunt        unk type  . Liver cancer Maternal Uncle   . Pancreatic cancer Cousin   . Cancer Cousin        unk type   [name] has a son, 16 and a daughter, 43. She is expecting a granddaughter in February. Patient has 3 paternal half sisters and 2 paternal half brothers, none have had cancer.   [name] mother had uterine cancer in her 78s and died at 76. Patient had 5 uncles and 2 aunts. One uncle had pancreatic cancer and died at 104. His son also died of pancreatic cancer, and a daughter was recently diagnosed with cancer patient is unsure of type. Another maternal uncle had liver cancer. Maternal grandmother died at 34, grandfather died at 65, no cancers.   [name] father died at 36, no cancer history. Patient had 11 paternal aunts/uncles. One aunt did have cancer, unknown type. No known cancers in paternal cousins, paternal grandmother died at 31, grandfather at 13.   [name] is unaware of previous family history of genetic testing for hereditary cancer risks. Patient's maternal ancestors are of Finnish/Dutch descent, and paternal ancestors are of English/Scottish descent. There is no reported Ashkenazi Jewish ancestry. There is no known consanguinity.     GENETIC TEST RESULTS: Genetic testing reported out on 07/10/2020 through the [facility]  CustomNext+RNA cancer panel found no pathogenic mutations.   The CustomNext-Cancer + RNAinsight panel  includes sequencing and/or deletion duplication testing of the following 91 genes: AIP, ALK, APC*, ATM*, AXIN2, BAP1, BARD1, BLM, BMPR1A, BRCA1*, BRCA2*, BRIP1*, CDC73, CDH1*, CDK4, CDKN1B, CDKN2A, CHEK2*, CTNNA1, DICER1, FANCC, FH, FLCN, GALNT12, KIF1B, LZTR1, MAX, MEN1, MET, MLH1*, MRE11A, MSH2*, MSH3, MSH6*, MUTYH*, NBN, NF1*, NF2, NTHL1, PALB2*, PHOX2B, PMS2*, POT1, PRKAR1A, PTCH1, PTEN*, RAD50, RAD51C*, RAD51D*, RB1, RECQL, RET, SDHA, SDHAF2, SDHB, SDHC, SDHD, SMAD4, SMARCA4, SMARCB1, SMARCE1, STK11, SUFU, TMEM127, TP53*, TSC1, TSC2, VHL and XRCC2 (sequencing and deletion/duplication); CASR, CFTR, CPA1, CTRC, EGFR, EGLN1, FAM175A, HOXB13, KIT, MITF, MLH3, PALLD, PDGFRA, POLD1, POLE, PRSS1, RINT1, RPS20, SPINK1 and TERT (sequencing only); EPCAM and GREM1 (deletion/duplication only).   The test report has been scanned into EPIC and is located under the Molecular Pathology section of the Results Review tab.  A portion of the result report is included below for reference.     We discussed with Ms. Benedict that because current genetic testing is not perfect, it is possible there may be a gene mutation in one of these genes that current testing cannot detect, but that chance is small.  We also discussed, that there could be another gene that has not yet been discovered, or that we have not yet tested, that is  responsible for the cancer diagnoses in the family. It is also possible there is a hereditary cause for the cancer in the family that Ms. Kingbird did not inherit and therefore was not identified in her testing.  Therefore, it is important to remain in touch with cancer genetics in the future so that we can continue to offer Ms. Bai the most up to date genetic testing.   ADDITIONAL GENETIC TESTING: We discussed with Ms. Risk that her genetic testing was fairly extensive.  If there are genes identified  to increase cancer risk that can be analyzed in the future, we would be happy to discuss and coordinate this testing at that time.    CANCER SCREENING RECOMMENDATIONS: Ms. Stambaugh test result is considered negative (normal).  This means that we have not identified a hereditary cause for her  personal and family history of cancer at this time. Most cancers happen by chance and this negative test suggests that her cancer may fall into this category.    While reassuring, this does not definitively rule out a hereditary predisposition to cancer. It is still possible that there could be genetic mutations that are undetectable by current technology. There could be genetic mutations in genes that have not been tested or identified to increase cancer risk.  Therefore, it is recommended she continue to follow the cancer management and screening guidelines provided by her oncology and primary healthcare provider.   An individual's cancer risk and medical management are not determined by genetic test results alone. Overall cancer risk assessment incorporates additional factors, including personal medical history, family history, and any available genetic information that may result in a personalized plan for cancer prevention and surveillance.  RECOMMENDATIONS FOR FAMILY MEMBERS:  Relatives in this family might be at some increased risk of developing cancer, over the general population risk, simply due to the family history of cancer.  We recommended female relatives in this family have a yearly mammogram beginning at age 58, or 64 years younger than the earliest onset of cancer, an annual clinical breast exam, and perform monthly breast self-exams. Female relatives in this family should also have a gynecological exam as recommended by their primary provider.  All family members should be referred for colonoscopy starting at age 26.    It is also possible there is a hereditary cause for the cancer in Ms. Kulinski's  family that she did not inherit and therefore was not identified in her.  Based on Ms. Brunet's family history, we recommended maternal relatives have genetic counseling and testing. Ms. Mclaurin will let us know if we can be of any assistance in coordinating genetic counseling and/or testing for these family members.  FOLLOW-UP: Lastly, we discussed with Ms. Nicholes that cancer genetics is a rapidly advancing field and it is possible that new genetic tests will be appropriate for her and/or her family members in the future. We encouraged her to remain in contact with cancer genetics on an annual basis so we can update her personal and family histories and let her know of advances in cancer genetics that may benefit this family.   Our contact number was provided. Ms. Coppa questions were answered to her satisfaction, and she knows she is welcome to call us at anytime with additional questions or concerns.   Faith Rogue, MS, [facility] Genetic Counselor [email or web address] Phone: [phone]

## 2020-07-10 NOTE — Telephone Encounter (Signed)
Revealed negative genetic testing.  We discussed that we do not know why she has cancer or why there is cancer in the family. It could be due to a different gene that we are not testing, or something our current technology cannot pick up.  It will be important for her to keep in contact with genetics to learn if additional testing may be needed in the future.  

## 2020-07-11 ENCOUNTER — Other Ambulatory Visit: Payer: Self-pay

## 2020-07-11 ENCOUNTER — Other Ambulatory Visit: Payer: Self-pay | Admitting: Oncology

## 2020-07-11 ENCOUNTER — Ambulatory Visit
Admission: RE | Admit: 2020-07-11 | Discharge: 2020-07-11 | Disposition: A | Discharge status: Home / Self Care | Payer: Medicaid Other | Source: Ambulatory Visit | Attending: Oncology | Admitting: Oncology

## 2020-07-11 ENCOUNTER — Other Ambulatory Visit: Payer: Self-pay | Admitting: *Deleted

## 2020-07-11 ENCOUNTER — Inpatient Hospital Stay: Payer: Medicaid Other

## 2020-07-11 VITALS — BP 91/63 | HR 86 | Resp 20

## 2020-07-11 DIAGNOSIS — C19 Malignant neoplasm of rectosigmoid junction: Secondary | ICD-10-CM | POA: Diagnosis not present

## 2020-07-11 DIAGNOSIS — Z5111 Encounter for antineoplastic chemotherapy: Secondary | ICD-10-CM | POA: Diagnosis not present

## 2020-07-11 DIAGNOSIS — Z436 Encounter for attention to other artificial openings of urinary tract: Secondary | ICD-10-CM | POA: Diagnosis not present

## 2020-07-11 DIAGNOSIS — Z936 Other artificial openings of urinary tract status: Secondary | ICD-10-CM

## 2020-07-11 HISTORY — PX: IR NEPHROSTOMY EXCHANGE LEFT: IMG6069

## 2020-07-11 HISTORY — PX: IR NEPHROSTOMY EXCHANGE RIGHT: IMG6070

## 2020-07-11 MED ORDER — HEPARIN SOD (PORK) LOCK FLUSH 100 UNIT/ML IV SOLN
INTRAVENOUS | Status: AC
  Filled 2020-07-11: qty 5

## 2020-07-11 MED ORDER — SODIUM CHLORIDE 0.9% FLUSH
10.0000 mL | INTRAVENOUS | Status: DC | PRN
  Administered 2020-07-11: 10 mL
  Filled 2020-07-11: qty 10

## 2020-07-11 MED ORDER — IODIXANOL 320 MG/ML IV SOLN
50.0000 mL | Freq: Once | INTRAVENOUS | Status: AC | PRN
  Administered 2020-07-11: 15 mL

## 2020-07-11 MED ORDER — PEGFILGRASTIM-CBQV 6 MG/0.6ML ~~LOC~~ SOSY
6.0000 mg | PREFILLED_SYRINGE | Freq: Once | SUBCUTANEOUS | Status: AC
  Administered 2020-07-11: 6 mg via SUBCUTANEOUS
  Filled 2020-07-11: qty 0.6

## 2020-07-11 MED ORDER — HEPARIN SOD (PORK) LOCK FLUSH 100 UNIT/ML IV SOLN
500.0000 [IU] | Freq: Once | INTRAVENOUS | Status: AC | PRN
  Administered 2020-07-11: 500 [IU]
  Filled 2020-07-11: qty 5

## 2020-07-11 NOTE — Discharge Instructions (Signed)
Percutaneous Nephrostomy Home Guide Percutaneous nephrostomy is a procedure to insert a flexible tube into your kidney so that urine can leave your body. This procedure may be done if a medical condition prevents urine from leaving your kidney in the usual way. During the procedure, the nephrostomy tube is inserted in the right or left side of your lower back and is connected to an external drainage bag. After you have a nephrostomy tube placed, urine will collect in the drainage bag outside of your body. You will need to empty and change the drainage bag as needed. You will also need to take steps to care for the area where the nephrostomy tube was inserted (tube insertion site). How do I care for my nephrostomy tube?  Always keep your tubing, the leg bag, or the bedside drainage bag below the level of your kidney so that your urine drains freely.  Avoid activities that would cause bending or pulling of your tubing. Ask your health care provider what activities are safe for you.  When connecting your nephrostomy tube to a drainage bag, make sure that there are no kinks in the tubing and that your urine is draining freely. You may want to gently wrap an elastic bandage over the tubing. This will help keep the tubing in place and prevent it from kinking. Make sure there is no tension on the tubing so it does not become dislodged.  At night, you may want to connect your nephrostomy tube or the leg bag to a larger bedside drainage bag. How do I empty the drainage bag? Empty the leg bag or bedside drainage bag whenever it becomes ? full. Also empty it before you go to sleep. Most drainage bags have a drain at the bottom that allows urine to be emptied. Follow these basic steps: 1. Hold the drainage bag over a toilet or collection container. Use a measuring container if your health care provider told you to measure your urine. 2. Open the drain of the bag and allow the urine to drain out. 3. After all the  urine has drained from the drainage bag, close the drain fully. 4. Flush the urine down the toilet. If a collection container was used, rinse the container. How do I change the dressing around the nephrostomy tube? Change your dressing and clean your tube exit site as told by your health care provider. You may need to change the dressing every day for the first 2 weeks after having a nephrostomy tube inserted. After the first 2 weeks, you may be told to change the dressing two times a week. Supplies needed:  Mild soap and water.  Split gauze pads, 4  4 inches (10 x 10 cm).  Gauze pads, 4  4 inches (10 x 10 cm).  Paper tape. How to change the dressing: Because of the location of your nephrostomy tube, you may need help from another person to complete dressing changes. Follow these basic steps: 1. Wash hands with soap and water. 2. Gently remove the tape and dressing from around the nephrostomy tube. Be careful not to pull on the tube while removing the dressing. Avoid using scissors because they may damage the tube. 3. Wash the skin around the tube with mild soap and water, rinse well, and pat the skin dry with a clean cloth. 4. Check the skin around the drain for redness, swelling, pus, warmth, or a bad smell. 5. If the drain was sutured to the skin, check the suture to verify  that it is still anchored in the skin. °6. Place two split gauze pads in and around the tube exit site. Do not apply ointments or alcohol to the site. °7. Place a gauze pad on top of the split gauze pad. °8. Coil the tube on top of the gauze. The tubing should rest on the gauze, not on the skin. °9. Place tape around each edge of the gauze pad. °10. Secure the nephrostomy tubing. Make sure that the tube does not kink or become pinched. The tubing should rest on the gauze pad, not on the skin. °11. Dispose of used supplies properly. ° °How do I flush my nephrostomy tube? °Use a saline syringe to rinse out (flush) your  nephrostomy tube as told by your health care provider. Flushing is easier if a three-way stopcock is placed between the tube and the drainage bag. One connection of the stopcock connects to your tube, the second connects to the drainage bag, and the third is usually covered with a cap. The lever on the stopcock points to the direction on the stopcock that is closed to flow. Normally, the lever points in the direction of the cap to allow urine to drain from the tube to the drainage bag. °Supplies needed: °· Rubbing alcohol wipe. °· 10 mL 0.9% saline syringe. °How to flush the tube: °1. Move the lever of the three-way stopcock so it points toward the drainage bag. °2. Clean the cap with a rubbing alcohol wipe. °3. Screw the tip of a 10 mL 0.9% saline syringe onto the cap. °4. Using the syringe plunger, slowly push the 10 mL 0.9% saline in the syringe over 5-10 seconds. If resistance is met or pain occurs while pushing, stop pushing the saline. °5. Remove the syringe from the cap. °6. Return the stopcock lever to the usual position, pointing in the direction of the cap. °7. Dispose of used supplies properly. °How do I replace the drainage bag? °Replace the drainage bag, three-way stopcock, and any extension tubing as told by your health care provider. Make sure you always have an extra drainage bag and connecting tubing available. °1. Empty urine from your drainage bag. °2. Gather a new drainage bag, three-way stopcock, and any extension tubing. °3. Remove the drainage bag, three-way stopcock, and any extension tubing from the nephrostomy tube. °4. Attach the new leg bag or bedside drainage bag, three-way stopcock, and any extension tubing to the nephrostomy tube. °5. Dispose of the used drainage bag, three-way stopcock, and any extension. °Contact a health care provider if: °· You have problems with any of the valves or tubing. °· You have persistent pain or soreness in your back. °· You have more redness, swelling,  or pain around your tube insertion site. °· You have more fluid or blood coming from your tube insertion site. °· Your tube insertion site feels warm to the touch. °· You have pus or a bad smell coming from your tube insertion site. °· You have increased urine output or you feel burning when urinating. °Get help right away if: °· You have pain in your abdomen during the first week. °· You have chest pain or have trouble breathing. °· You have a new appearance of blood in your urine. °· You have a fever or chills. °· You have back pain that is not relieved by your medicine. °· You have decreased urine output. °· Your nephrostomy tube comes out. °This information is not intended to replace advice given to you by   your health care provider. Make sure you discuss any questions you have with your health care provider. Document Revised: 11/09/2018 Document Reviewed: 04/30/2016 Elsevier Patient Education  2020 [employer]  

## 2020-07-11 NOTE — Procedures (Signed)
Interventional Radiology Procedure Note  Procedure: Bilateral nephrostomy tube check/change.  Findings: Please refer to procedural dictation for full description. Patent left ureter.  Persistently obstructed right mid ureter.  Nephrostomy catheters exchanged successfully.  Complications: None immediate  Estimated Blood Loss: <5 mL  Recommendations: Consider capping trial and removal of left nephrostomy versus nephroureteral stent placement. Will discuss with Dr. Grayland Ormond. Keep to bag drainage for now.   Will arrange for 6 week follow up routine check/change, with possible nephroureteral stent placements at that time.   [name], MD Pager: [phone]

## 2020-07-20 NOTE — Progress Notes (Signed)
[facility]  [phone] Fax:(336) [phone]  ID: [name] OB: 1965/10/21  MR#: [id number]  YKD#:[id number]  Patient Care Team: Volney American, PA-C as PCP - General (Family Medicine) [name], MD as Consulting Physician (Oncology) [name], RN as Oncology Nurse Navigator  CHIEF COMPLAINT: Metastatic colorectal adenocarcinoma, KRAS wild-type  INTERVAL HISTORY: Patient returns to clinic today for further evaluation and consideration of cycle 4 of FOLFOX.  She continues to have chronic weakness and fatigue.  She continues to have occasional rectal bleeding, but this is significantly improved.  She otherwise feels well. She has no neurologic complaints.  She has a fair appetite, but denies weight loss.  She denies any chest pain, shortness of breath, cough, or hemoptysis.  She denies any nausea, vomiting, constipation, or diarrhea.  She has no urinary complaints.  Patient offers no further specific complaints today.  REVIEW OF SYSTEMS:   Review of Systems  Constitutional: Positive for malaise/fatigue. Negative for fever and weight loss.  HENT: Negative.  Negative for congestion.   Respiratory: Negative.  Negative for cough, hemoptysis and shortness of breath.   Cardiovascular: Negative.  Negative for chest pain and leg swelling.  Gastrointestinal: Positive for blood in stool. Negative for constipation, nausea and vomiting.  Genitourinary: Negative.  Negative for hematuria.  Musculoskeletal: Negative.  Negative for back pain.  Skin: Negative.  Negative for rash.  Neurological: Positive for weakness. Negative for dizziness, seizures and headaches.  Psychiatric/Behavioral: Negative.  The patient is not nervous/anxious.     As per HPI. Otherwise, a complete review of systems is negative.  PAST MEDICAL HISTORY: Past Medical History:  Diagnosis Date  . Colon cancer ([place])   . Family history of pancreatic cancer   . Family  history of uterine cancer     PAST SURGICAL HISTORY: Past Surgical History:  Procedure Laterality Date  . COLONOSCOPY WITH PROPOFOL N/A 05/27/2020   Procedure: COLONOSCOPY WITH PROPOFOL;  Surgeon: [name], MD;  Location: [facility] ENDOSCOPY;  Service: Gastroenterology;  Laterality: N/A;  . FLEXIBLE SIGMOIDOSCOPY N/A 05/28/2020   Procedure: FLEXIBLE SIGMOIDOSCOPY;  Surgeon: [name], MD;  Location[facility] ENDOSCOPY;  Service: Gastroenterology;  Laterality: N/A;  . IR NEPHROSTOMY EXCHANGE LEFT  07/11/2020  . IR NEPHROSTOMY EXCHANGE RIGHT  07/11/2020  . IR NEPHROSTOMY PLACEMENT LEFT  05/23/2020  . IR NEPHROSTOMY PLACEMENT RIGHT  05/23/2020  . PORTACATH PLACEMENT Left 05/29/2020   Procedure: INSERTION PORT-A-CATH;  Surgeon: [name], MD;  Location: [facility] ORS;  Service: General;  Laterality: Left;    FAMILY HISTORY: Family History  Problem Relation Age of Onset  . Other Mother        Corticobasal degeneration  . Uterine cancer Mother   . Diabetes Maternal Grandmother   . Stroke Maternal Grandfather   . Stroke Paternal Grandfather   . Heart disease Father   . Pancreatic cancer Maternal Uncle 23  . Cancer Paternal Aunt        unk type  . Liver cancer Maternal Uncle   . Pancreatic cancer Cousin   . Cancer Cousin        unk type    ADVANCED DIRECTIVES (Y/N):  N  HEALTH MAINTENANCE: Social History   Tobacco Use  . Smoking status: Never Smoker  . Smokeless tobacco: Never Used  Vaping Use  . Vaping Use: Never used  Substance Use Topics  . Alcohol use: Never  . Drug use: Never     Colonoscopy:  PAP:  Bone  density:  Lipid panel:  No Known Allergies  Current Outpatient Medications  Medication Sig Dispense Refill  . levofloxacin (LEVAQUIN) 500 MG tablet Take 1 tablet (500 mg total) by mouth daily. 7 tablet 0  . ondansetron (ZOFRAN) 4 MG tablet Take 4 mg by mouth every 8 (eight) hours as needed for nausea or vomiting.    . pantoprazole (PROTONIX) 40  MG tablet Take 1 tablet (40 mg total) by mouth 2 (two) times daily. 60 tablet 0   No current facility-administered medications for this visit.   Facility-Administered Medications Ordered in Other Visits  Medication Dose Route Frequency Provider Last Rate Last Admin  . fluorouracil (ADRUCIL) 3,750 mg in sodium chloride 0.9 % 75 mL chemo infusion  2,400 mg/m2 (Treatment Plan Recorded) Intravenous 1 day or 1 dose [name], MD   3,750 mg at 07/22/20 1225    OBJECTIVE: Vitals:   07/22/20 0855  BP: 102/68  Pulse: 83  Resp: 16  Temp: (!) 96.4 F (35.8 C)  SpO2: 100%     Body mass index is 25.96 kg/m.    ECOG FS:1 - Symptomatic but completely ambulatory  General: Well-developed, well-nourished, no acute distress. Eyes: Pink conjunctiva, anicteric sclera. HEENT: Normocephalic, moist mucous membranes. Lungs: No audible wheezing or coughing. Heart: Regular rate and rhythm. Abdomen: Soft, nontender, no obvious distention. Musculoskeletal: No edema, cyanosis, or clubbing. Neuro: Alert, answering all questions appropriately. Cranial nerves grossly intact. Skin: No rashes or petechiae noted. Psych: Normal affect.  LAB RESULTS:  Lab Results  Component Value Date   NA 135 07/22/2020   K 4.0 07/22/2020   CL 101 07/22/2020   CO2 26 07/22/2020   GLUCOSE 82 07/22/2020   BUN 13 07/22/2020   CREATININE 1.21 (H) 07/22/2020   CALCIUM 8.4 (L) 07/22/2020   PROT 6.9 07/22/2020   ALBUMIN 2.9 (L) 07/22/2020   AST 42 (H) 07/22/2020   ALT 39 07/22/2020   ALKPHOS 62 07/22/2020   BILITOT 0.3 07/22/2020   GFRNONAA 54 (L) 07/22/2020    Lab Results  Component Value Date   WBC 8.2 07/22/2020   NEUTROABS 4.7 07/22/2020   HGB 9.1 (L) 07/22/2020   HCT 28.1 (L) 07/22/2020   MCV 93.0 07/22/2020   PLT 267 07/22/2020     STUDIES: IR NEPHROSTOMY EXCHANGE LEFT  Result Date: 07/11/2020 INDICATION: 54 year old female with history of metastatic colorectal adenocarcinoma and bilateral  ureteral obstruction status post bilateral nephrostomy tube placement on 05/23/2020. She presents today for her initial routine check and exchange. EXAM: FLUOROSCOPIC GUIDED BILATERAL SIDED NEPHROSTOMY CATHETER EXCHANGE COMPARISON:  05/23/2020 CONTRAST:  A total of 15 mL Isovue-300 administered was administered into both collecting systems FLUOROSCOPY TIME:  2.4 minutes, 16.1 mGy COMPLICATIONS: None immediate. TECHNIQUE: Informed written consent was obtained from the patient after a discussion of the risks, benefits and alternatives to treatment. Questions regarding the procedure were encouraged and answered. A timeout was performed prior to the initiation of the procedure. The bilateral flanks and external portions of existing nephrostomy catheters were prepped and draped in the usual sterile fashion. A sterile drape was applied covering the operative field. Maximum barrier sterile technique with sterile gowns and gloves were used for the procedure. A timeout was performed prior to the initiation of the procedure. A pre procedural spot fluoroscopic image was obtained. Beginning with the left-sided nephrostomy, a small amount of contrast was injected via the existing left-sided nephrostomy catheter demonstrating appropriate positioning within the renal pelvis. Contrast is visualized to flow freely through the  left ureter into the urinary bladder. The existing nephrostomy catheter was cut and cannulated with a Benson wire which was coiled within the renal pelvis. Under intermittent fluoroscopic guidance, the existing nephrostomy catheter was exchanged for a new 10.2 [place] all-purpose drainage catheter. Limited contrast injection confirmed appropriate positioning within the left renal pelvis and a post exchange fluoroscopic image was obtained. The catheter was locked, secured to the skin with an interrupted suture and reconnected to a gravity bag. The identical repeat procedure was repeated for the contralateral  right-sided nephrostomy, ultimately allowing successful exchange of a new 10.2 [place] all-purpose drainage catheter with end coiled and locked within the right renal pelvis. The right-sided ureter remains occluded in its midportion. Dressings were placed. The patient tolerated the above procedures well without immediate postprocedural complication. FINDINGS: The existing nephrostomy catheters are appropriately positioned and functioning. Patent left ureter. The right ureter appears obstructed at the midportion. After successful fluoroscopic guided exchange, new bilateral 10.2French nephrostomy catheters are coiled and locked within the respective renal pelvises. IMPRESSION: Successful fluoroscopic guided exchange of bilateral 10.2 French percutaneous nephrostomy catheters. PLAN: After discussion with Dr. Grayland Ormond, the patient will return in 1 month for left-sided nephroureteral stent placement. The right ureteral patency will be reassessed at this time, and if patent, right-sided nephroureteral stent placement may also be pursued. [name], MD [facility] Radiology Electronically Signed   By: [name] MD   On: 07/11/2020 09:49   IR NEPHROSTOMY EXCHANGE RIGHT  Result Date: 07/11/2020 INDICATION: 54 year old female with history of metastatic colorectal adenocarcinoma and bilateral ureteral obstruction status post bilateral nephrostomy tube placement on 05/23/2020. She presents today for her initial routine check and exchange. EXAM: FLUOROSCOPIC GUIDED BILATERAL SIDED NEPHROSTOMY CATHETER EXCHANGE COMPARISON:  05/23/2020 CONTRAST:  A total of 15 mL Isovue-300 administered was administered into both collecting systems FLUOROSCOPY TIME:  2.4 minutes, 06.3 mGy COMPLICATIONS: None immediate. TECHNIQUE: Informed written consent was obtained from the patient after a discussion of the risks, benefits and alternatives to treatment. Questions regarding the procedure  were encouraged and answered. A timeout was performed prior to the initiation of the procedure. The bilateral flanks and external portions of existing nephrostomy catheters were prepped and draped in the usual sterile fashion. A sterile drape was applied covering the operative field. Maximum barrier sterile technique with sterile gowns and gloves were used for the procedure. A timeout was performed prior to the initiation of the procedure. A pre procedural spot fluoroscopic image was obtained. Beginning with the left-sided nephrostomy, a small amount of contrast was injected via the existing left-sided nephrostomy catheter demonstrating appropriate positioning within the renal pelvis. Contrast is visualized to flow freely through the left ureter into the urinary bladder. The existing nephrostomy catheter was cut and cannulated with a Benson wire which was coiled within the renal pelvis. Under intermittent fluoroscopic guidance, the existing nephrostomy catheter was exchanged for a new 10.2 [place] all-purpose drainage catheter. Limited contrast injection confirmed appropriate positioning within the left renal pelvis and a post exchange fluoroscopic image was obtained. The catheter was locked, secured to the skin with an interrupted suture and reconnected to a gravity bag. The identical repeat procedure was repeated for the contralateral right-sided nephrostomy, ultimately allowing successful exchange of a new 10.2 [place] all-purpose drainage catheter with end coiled and locked within the right renal pelvis. The right-sided ureter remains occluded in its midportion. Dressings were placed. The patient tolerated the above procedures well without immediate postprocedural complication. FINDINGS: The existing  nephrostomy catheters are appropriately positioned and functioning. Patent left ureter. The right ureter appears obstructed at the midportion. After successful fluoroscopic guided exchange, new bilateral 10.2French  nephrostomy catheters are coiled and locked within the respective renal pelvises. IMPRESSION: Successful fluoroscopic guided exchange of bilateral 10.2 French percutaneous nephrostomy catheters. PLAN: After discussion with Dr. Grayland Ormond, the patient will return in 1 month for left-sided nephroureteral stent placement. The right ureteral patency will be reassessed at this time, and if patent, right-sided nephroureteral stent placement may also be pursued. [name], MD [facility] Radiology Electronically Signed   By: [name] MD   On: 07/11/2020 09:49    ASSESSMENT: Metastatic colorectal adenocarcinoma,  KRAS wild-type.  PLAN:   1. Metastatic colorectal adenocarcinoma: Although unclear whether malignancy originated in colon or rectum, given the pattern of spread to liver, will treat as a stage IV colon cancer.  CEA is only mildly elevated at 9.9.  Patient has had port placement.  Plan to treat with FOLFOX plus Avastin every 2 weeks.  Can consider Panitumumab as second line.  Continue to hold Avastin given continued rectal bleeding.  Proceed with cycle 4 of treatment today.  Return to clinic in 2 days for pump removal and Udenyca.  Patient will then return to clinic in 2 weeks for further evaluation and consideration of cycle 5.  Will reimage and add Avastin after cycle 6.   Appreciate palliative care and genetics input.  2.  Anemia: Mildly improved.  Hemoglobin 9.1.  Continue to hold Avastin as above. 3.  Renal insufficiency: Chronic and unchanged.  Patient's creatinine is 1.21. 4.  Neutropenia: Udenyca as needed for remainder of cycles if needed. 5.  Hypokalemia: Resolved. 6.  Congestion/sinus drainage: Resolved. 7.  Hypotension: Resolved.  Patient expressed understanding and was in agreement with this plan. She also understands that She can call clinic at any time with any questions, concerns, or complaints.   Cancer Staging Metastatic  colorectal cancer [facility]) Staging form: Colon and Rectum, AJCC 8th Edition - Clinical stage from 05/31/2020: Stage IVC (cTX, cNX, pM1c) - Signed by [name], MD on 05/31/2020   [name], MD   07/22/2020 4:40 PM

## 2020-07-21 ENCOUNTER — Other Ambulatory Visit: Payer: Self-pay | Admitting: Oncology

## 2020-07-22 ENCOUNTER — Inpatient Hospital Stay (HOSPITAL_BASED_OUTPATIENT_CLINIC_OR_DEPARTMENT_OTHER): Payer: Medicaid Other | Admitting: Oncology

## 2020-07-22 ENCOUNTER — Encounter: Payer: Self-pay | Admitting: Oncology

## 2020-07-22 ENCOUNTER — Inpatient Hospital Stay: Payer: Medicaid Other

## 2020-07-22 VITALS — BP 102/68 | HR 83 | Temp 96.4°F | Resp 16 | Wt 132.9 lb

## 2020-07-22 DIAGNOSIS — Z5111 Encounter for antineoplastic chemotherapy: Secondary | ICD-10-CM | POA: Diagnosis not present

## 2020-07-22 DIAGNOSIS — C19 Malignant neoplasm of rectosigmoid junction: Secondary | ICD-10-CM | POA: Diagnosis not present

## 2020-07-22 LAB — CBC WITH DIFFERENTIAL/PLATELET
Abs Immature Granulocytes: 0.3 10*3/uL — ABNORMAL HIGH (ref 0.00–0.07)
Basophils Absolute: 0 10*3/uL (ref 0.0–0.1)
Basophils Relative: 1 %
Eosinophils Absolute: 0.1 10*3/uL (ref 0.0–0.5)
Eosinophils Relative: 1 %
HCT: 28.1 % — ABNORMAL LOW (ref 36.0–46.0)
Hemoglobin: 9.1 g/dL — ABNORMAL LOW (ref 12.0–15.0)
Immature Granulocytes: 4 %
Lymphocytes Relative: 25 %
Lymphs Abs: 2.1 10*3/uL (ref 0.7–4.0)
MCH: 30.1 pg (ref 26.0–34.0)
MCHC: 32.4 g/dL (ref 30.0–36.0)
MCV: 93 fL (ref 80.0–100.0)
Monocytes Absolute: 1 10*3/uL (ref 0.1–1.0)
Monocytes Relative: 12 %
Neutro Abs: 4.7 10*3/uL (ref 1.7–7.7)
Neutrophils Relative %: 57 %
Platelets: 267 10*3/uL (ref 150–400)
RBC: 3.02 MIL/uL — ABNORMAL LOW (ref 3.87–5.11)
RDW: 23.7 % — ABNORMAL HIGH (ref 11.5–15.5)
WBC: 8.2 10*3/uL (ref 4.0–10.5)
nRBC: 0 % (ref 0.0–0.2)

## 2020-07-22 LAB — COMPREHENSIVE METABOLIC PANEL
ALT: 39 U/L (ref 0–44)
AST: 42 U/L — ABNORMAL HIGH (ref 15–41)
Albumin: 2.9 g/dL — ABNORMAL LOW (ref 3.5–5.0)
Alkaline Phosphatase: 62 U/L (ref 38–126)
Anion gap: 8 (ref 5–15)
BUN: 13 mg/dL (ref 6–20)
CO2: 26 mmol/L (ref 22–32)
Calcium: 8.4 mg/dL — ABNORMAL LOW (ref 8.9–10.3)
Chloride: 101 mmol/L (ref 98–111)
Creatinine, Ser: 1.21 mg/dL — ABNORMAL HIGH (ref 0.44–1.00)
GFR, Estimated: 54 mL/min — ABNORMAL LOW (ref >60)
Glucose, Bld: 82 mg/dL (ref 70–99)
Potassium: 4 mmol/L (ref 3.5–5.1)
Sodium: 135 mmol/L (ref 135–145)
Total Bilirubin: 0.3 mg/dL (ref 0.3–1.2)
Total Protein: 6.9 g/dL (ref 6.5–8.1)

## 2020-07-22 LAB — URINALYSIS, DIPSTICK ONLY
Bilirubin Urine: NEGATIVE
Glucose, UA: NEGATIVE mg/dL
Ketones, ur: NEGATIVE mg/dL
Nitrite: POSITIVE — AB
Protein, ur: 100 mg/dL — AB
Specific Gravity, Urine: 1.015 (ref 1.005–1.030)
pH: 5 (ref 5.0–8.0)

## 2020-07-22 MED ORDER — SODIUM CHLORIDE 0.9 % IV SOLN
10.0000 mg | Freq: Once | INTRAVENOUS | Status: AC
  Administered 2020-07-22: 10:00:00 10 mg via INTRAVENOUS
  Filled 2020-07-22: qty 10

## 2020-07-22 MED ORDER — DEXTROSE 5 % IV SOLN
Freq: Once | INTRAVENOUS | Status: AC
  Filled 2020-07-22: qty 250

## 2020-07-22 MED ORDER — SODIUM CHLORIDE 0.9 % IV SOLN
2400.0000 mg/m2 | INTRAVENOUS | Status: DC
  Administered 2020-07-22: 12:00:00 3750 mg via INTRAVENOUS
  Filled 2020-07-22: qty 75

## 2020-07-22 MED ORDER — DEXTROSE 5 % IV SOLN
85.0000 mg/m2 | Freq: Once | INTRAVENOUS | Status: AC
  Administered 2020-07-22: 10:00:00 135 mg via INTRAVENOUS
  Filled 2020-07-22: qty 27

## 2020-07-22 MED ORDER — FLUOROURACIL CHEMO INJECTION 2.5 GM/50ML
400.0000 mg/m2 | Freq: Once | INTRAVENOUS | Status: AC
  Administered 2020-07-22: 12:00:00 650 mg via INTRAVENOUS
  Filled 2020-07-22: qty 13

## 2020-07-22 MED ORDER — SODIUM CHLORIDE 0.9% FLUSH
10.0000 mL | Freq: Once | INTRAVENOUS | Status: DC
  Filled 2020-07-22: qty 10

## 2020-07-22 MED ORDER — LEUCOVORIN CALCIUM INJECTION 350 MG
650.0000 mg | Freq: Once | INTRAVENOUS | Status: AC
  Administered 2020-07-22: 10:00:00 650 mg via INTRAVENOUS
  Filled 2020-07-22: qty 32.5

## 2020-07-22 MED ORDER — HEPARIN SOD (PORK) LOCK FLUSH 100 UNIT/ML IV SOLN
500.0000 [IU] | Freq: Once | INTRAVENOUS | Status: DC
  Filled 2020-07-22: qty 5

## 2020-07-22 MED ORDER — PALONOSETRON HCL INJECTION 0.25 MG/5ML
0.2500 mg | Freq: Once | INTRAVENOUS | Status: AC
  Administered 2020-07-22: 10:00:00 0.25 mg via INTRAVENOUS
  Filled 2020-07-22: qty 5

## 2020-07-22 NOTE — Progress Notes (Signed)
Stable at discharge 

## 2020-07-22 NOTE — Progress Notes (Signed)
md holding Mvasi d/t bleeding and will try to start with cycle 7

## 2020-07-24 ENCOUNTER — Inpatient Hospital Stay: Payer: Medicaid Other

## 2020-07-24 DIAGNOSIS — Z5111 Encounter for antineoplastic chemotherapy: Secondary | ICD-10-CM | POA: Diagnosis not present

## 2020-07-24 DIAGNOSIS — C19 Malignant neoplasm of rectosigmoid junction: Secondary | ICD-10-CM

## 2020-07-24 MED ORDER — PEGFILGRASTIM-CBQV 6 MG/0.6ML ~~LOC~~ SOSY
6.0000 mg | PREFILLED_SYRINGE | Freq: Once | SUBCUTANEOUS | Status: AC
  Administered 2020-07-24: 12:00:00 6 mg via SUBCUTANEOUS

## 2020-07-24 MED ORDER — HEPARIN SOD (PORK) LOCK FLUSH 100 UNIT/ML IV SOLN
500.0000 [IU] | Freq: Once | INTRAVENOUS | Status: AC | PRN
  Administered 2020-07-24: 12:00:00 500 [IU]
  Filled 2020-07-24: qty 5

## 2020-07-24 MED ORDER — SODIUM CHLORIDE 0.9% FLUSH
10.0000 mL | INTRAVENOUS | Status: DC | PRN
  Administered 2020-07-24: 12:00:00 10 mL
  Filled 2020-07-24: qty 10

## 2020-07-31 ENCOUNTER — Inpatient Hospital Stay: Payer: Medicaid Other

## 2020-07-31 ENCOUNTER — Other Ambulatory Visit: Payer: Self-pay

## 2020-07-31 NOTE — Progress Notes (Signed)
Nutrition Assessment   Reason for Assessment:  Weight loss, poor appetite   ASSESSMENT:  54 year old female with metastatic colorectal adenocarcinoma. Patient receiving folfox, avastin on hold.    Met with patient and daughter in clinic. Patient reports that she does not have much of an appetite and foods don't taste the same.  Reports that MD is concerned about weight loss.  She is pleased with weight loss and does not want to gain weight.  Reports that has never been a breakfast eater. Reports drinks English Breakfast tea, juice at 6:30am. Mid morning may have sandwich (ham or [place] and cheese), 1/2 cheese biscuit, peanut butter and jelly.  Around 2pm may eat 1/2 hamburger, hot dog or spaghetti and dinner is usually leftovers.  Does not like milky shakes.  Likes ensure clear but having a hard time finding them.    Reports diarrhea. Takes imodium. Some nausea.      Medications: zofran, protonix   Labs: reviewed   Anthropometrics:   Height: 60 inches Weight: 132 lb 14.4 oz on 12/21 UBW: 128-133 lb for awhile now per patient 152 lb 09/2018 BMI: 25   Estimated Energy Needs  Kcals: 1500-1800 Protein: 75-90 g Fluid: 1.5 L   NUTRITION DIAGNOSIS: Inadequate oral intake related to cancer related treatment side effects as evidenced by poor appetite, taste alterations     INTERVENTION:  Discussed importance of good nutrition and weight maintenance during treatment Discussed strategies to help with diarrhea and taste change. Handout provided Provided sample of boost soothe.  Also provided additional juice options for patient and where to buy. Provide recipes for patient to try.  Contact information provided   MONITORING, EVALUATION, GOAL: weight trends, intake   Next Visit: Feb 3 phone call  Barbara Keng B. Zenia Resides, [place], [place] Registered Dietitian [phone] (mobile)

## 2020-08-03 NOTE — Progress Notes (Unsigned)
[place]  [phone]) [phone] [phone]) [phone]  ID: Anita Sullivan OB: [birth year]  MR#: [id number]  KTG#:[id number]  Patient Care Team: Volney American, PA-C as PCP - General (Family Medicine) [name], MD as Consulting Physician (Oncology) [name], RN as Oncology Nurse Navigator  CHIEF COMPLAINT: Metastatic colorectal adenocarcinoma, KRAS wild-type  INTERVAL HISTORY: Patient returns to clinic today for further evaluation and consideration of cycle 5 of FOLFOX.  She continues to have chronic weakness and fatigue.  She has increased pain at the site of her left nephrostomy tube which was just changed on July 11, 2020.  She otherwise feels well.  She states her rectal bleeding has essentially stopped. She has no neurologic complaints.  She has a fair appetite, but denies weight loss.  She denies any chest pain, shortness of breath, cough, or hemoptysis.  She denies any nausea, vomiting, constipation, or diarrhea.  She has no urinary complaints.  Patient offers no further specific complaints today.  REVIEW OF SYSTEMS:   Review of Systems  Constitutional: Positive for malaise/fatigue. Negative for fever and weight loss.  HENT: Negative.  Negative for congestion.   Respiratory: Negative.  Negative for cough, hemoptysis and shortness of breath.   Cardiovascular: Negative.  Negative for chest pain and leg swelling.  Gastrointestinal: Negative for blood in stool, constipation, nausea and vomiting.  Genitourinary: Positive for flank pain. Negative for hematuria.  Musculoskeletal: Negative for back pain.  Skin: Negative.  Negative for rash.  Neurological: Positive for weakness. Negative for dizziness, seizures and headaches.  Psychiatric/Behavioral: Negative.  The patient is not nervous/anxious.     As per HPI. Otherwise, a complete review of systems is negative.  PAST MEDICAL HISTORY: Past Medical History:  Diagnosis Date  .  Colon cancer ([place])   . Family history of pancreatic cancer   . Family history of uterine cancer     PAST SURGICAL HISTORY: Past Surgical History:  Procedure Laterality Date  . COLONOSCOPY WITH PROPOFOL N/A 05/27/2020   Procedure: COLONOSCOPY WITH PROPOFOL;  Surgeon: [name], MD;  Location: [facility] ENDOSCOPY;  Service: Gastroenterology;  Laterality: N/A;  . FLEXIBLE SIGMOIDOSCOPY N/A 05/28/2020   Procedure: FLEXIBLE SIGMOIDOSCOPY;  Surgeon: [name], MD;  Location: [facility] ENDOSCOPY;  Service: Gastroenterology;  Laterality: N/A;  . IR NEPHROSTOMY EXCHANGE LEFT  07/11/2020  . IR NEPHROSTOMY EXCHANGE RIGHT  07/11/2020  . IR NEPHROSTOMY PLACEMENT LEFT  05/23/2020  . IR NEPHROSTOMY PLACEMENT RIGHT  05/23/2020  . PORTACATH PLACEMENT Left 05/29/2020   Procedure: INSERTION PORT-A-CATH;  Surgeon: [name], MD;  Location: [facility] ORS;  Service: General;  Laterality: Left;    FAMILY HISTORY: Family History  Problem Relation Age of Onset  . Other Mother        Corticobasal degeneration  . Uterine cancer Mother   . Diabetes Maternal Grandmother   . Stroke Maternal Grandfather   . Stroke Paternal Grandfather   . Heart disease Father   . Pancreatic cancer Maternal Uncle 52  . Cancer Paternal Aunt        unk type  . Liver cancer Maternal Uncle   . Pancreatic cancer Cousin   . Cancer Cousin        unk type    ADVANCED DIRECTIVES (Y/N):  N  HEALTH MAINTENANCE: Social History   Tobacco Use  . Smoking status: Never Smoker  . Smokeless tobacco: Never Used  Vaping Use  . Vaping Use: Never used  Substance Use Topics  . Alcohol use:  Never  . Drug use: Never     Colonoscopy:  PAP:  Bone density:  Lipid panel:  No Known Allergies  Current Outpatient Medications  Medication Sig Dispense Refill  . ondansetron (ZOFRAN) 4 MG tablet Take 4 mg by mouth every 8 (eight) hours as needed for nausea or vomiting.    . pantoprazole (PROTONIX) 40 MG tablet Take 1 tablet (40  mg total) by mouth 2 (two) times daily. 60 tablet 0  . HYDROcodone-acetaminophen (NORCO) 5-325 MG tablet Take 1 tablet by mouth every 6 (six) hours as needed for moderate pain. 30 tablet 0   No current facility-administered medications for this visit.    OBJECTIVE: Vitals:   08/06/20 0932  BP: (!) 109/94  Pulse: 85  Temp: 97.6 F (36.4 C)  SpO2: 100%     Body mass index is 26.21 kg/m.    ECOG FS:1 - Symptomatic but completely ambulatory  General: Well-developed, well-nourished, no acute distress. Eyes: Pink conjunctiva, anicteric sclera. HEENT: Normocephalic, moist mucous membranes. Lungs: No audible wheezing or coughing. Heart: Regular rate and rhythm. Abdomen: Soft, nontender, no obvious distention. Musculoskeletal: No edema, cyanosis, or clubbing. Neuro: Alert, answering all questions appropriately. Cranial nerves grossly intact. Skin: No rashes or petechiae noted. Psych: Normal affect.  LAB RESULTS:  Lab Results  Component Value Date   NA 136 08/06/2020   K 3.8 08/06/2020   CL 104 08/06/2020   CO2 20 (L) 08/06/2020   GLUCOSE 87 08/06/2020   BUN 14 08/06/2020   CREATININE 1.11 (H) 08/06/2020   CALCIUM 8.1 (L) 08/06/2020   PROT 6.3 (L) 08/06/2020   ALBUMIN 2.7 (L) 08/06/2020   AST 25 08/06/2020   ALT 15 08/06/2020   ALKPHOS 66 08/06/2020   BILITOT 0.3 08/06/2020   GFRNONAA 60 (L) 08/06/2020    Lab Results  Component Value Date   WBC 10.2 08/06/2020   NEUTROABS 6.3 08/06/2020   HGB 9.1 (L) 08/06/2020   HCT 28.6 (L) 08/06/2020   MCV 94.1 08/06/2020   PLT 216 08/06/2020     STUDIES: IR NEPHROSTOMY EXCHANGE LEFT  Result Date: 07/11/2020 INDICATION: 55 year old female with history of metastatic colorectal adenocarcinoma and bilateral ureteral obstruction status post bilateral nephrostomy tube placement on 05/23/2020. She presents today for her initial routine check and exchange. EXAM: FLUOROSCOPIC GUIDED BILATERAL SIDED NEPHROSTOMY CATHETER EXCHANGE  COMPARISON:  05/23/2020 CONTRAST:  A total of 15 mL Isovue-300 administered was administered into both collecting systems FLUOROSCOPY TIME:  2.4 minutes, 94.4 mGy COMPLICATIONS: None immediate. TECHNIQUE: Informed written consent was obtained from the patient after a discussion of the risks, benefits and alternatives to treatment. Questions regarding the procedure were encouraged and answered. A timeout was performed prior to the initiation of the procedure. The bilateral flanks and external portions of existing nephrostomy catheters were prepped and draped in the usual sterile fashion. A sterile drape was applied covering the operative field. Maximum barrier sterile technique with sterile gowns and gloves were used for the procedure. A timeout was performed prior to the initiation of the procedure. A pre procedural spot fluoroscopic image was obtained. Beginning with the left-sided nephrostomy, a small amount of contrast was injected via the existing left-sided nephrostomy catheter demonstrating appropriate positioning within the renal pelvis. Contrast is visualized to flow freely through the left ureter into the urinary bladder. The existing nephrostomy catheter was cut and cannulated with a Benson wire which was coiled within the renal pelvis. Under intermittent fluoroscopic guidance, the existing nephrostomy catheter was exchanged for a new  10.2 [place] all-purpose drainage catheter. Limited contrast injection confirmed appropriate positioning within the left renal pelvis and a post exchange fluoroscopic image was obtained. The catheter was locked, secured to the skin with an interrupted suture and reconnected to a gravity bag. The identical repeat procedure was repeated for the contralateral right-sided nephrostomy, ultimately allowing successful exchange of a new 10.2 [place] all-purpose drainage catheter with end coiled and locked within the right renal pelvis. The right-sided ureter remains occluded in its  midportion. Dressings were placed. The patient tolerated the above procedures well without immediate postprocedural complication. FINDINGS: The existing nephrostomy catheters are appropriately positioned and functioning. Patent left ureter. The right ureter appears obstructed at the midportion. After successful fluoroscopic guided exchange, new bilateral 10.2French nephrostomy catheters are coiled and locked within the respective renal pelvises. IMPRESSION: Successful fluoroscopic guided exchange of bilateral 10.2 French percutaneous nephrostomy catheters. PLAN: After discussion with Dr. Grayland Ormond, the patient will return in 1 month for left-sided nephroureteral stent placement. The right ureteral patency will be reassessed at this time, and if patent, right-sided nephroureteral stent placement may also be pursued. [name], MD [facility] Radiology Electronically Signed   By: [name] MD   On: 07/11/2020 09:49   IR NEPHROSTOMY EXCHANGE RIGHT  Result Date: 07/11/2020 INDICATION: 55 year old female with history of metastatic colorectal adenocarcinoma and bilateral ureteral obstruction status post bilateral nephrostomy tube placement on 05/23/2020. She presents today for her initial routine check and exchange. EXAM: FLUOROSCOPIC GUIDED BILATERAL SIDED NEPHROSTOMY CATHETER EXCHANGE COMPARISON:  05/23/2020 CONTRAST:  A total of 15 mL Isovue-300 administered was administered into both collecting systems FLUOROSCOPY TIME:  2.4 minutes, 16.1 mGy COMPLICATIONS: None immediate. TECHNIQUE: Informed written consent was obtained from the patient after a discussion of the risks, benefits and alternatives to treatment. Questions regarding the procedure were encouraged and answered. A timeout was performed prior to the initiation of the procedure. The bilateral flanks and external portions of existing nephrostomy catheters were prepped and draped in the usual sterile  fashion. A sterile drape was applied covering the operative field. Maximum barrier sterile technique with sterile gowns and gloves were used for the procedure. A timeout was performed prior to the initiation of the procedure. A pre procedural spot fluoroscopic image was obtained. Beginning with the left-sided nephrostomy, a small amount of contrast was injected via the existing left-sided nephrostomy catheter demonstrating appropriate positioning within the renal pelvis. Contrast is visualized to flow freely through the left ureter into the urinary bladder. The existing nephrostomy catheter was cut and cannulated with a Benson wire which was coiled within the renal pelvis. Under intermittent fluoroscopic guidance, the existing nephrostomy catheter was exchanged for a new 10.2 [place] all-purpose drainage catheter. Limited contrast injection confirmed appropriate positioning within the left renal pelvis and a post exchange fluoroscopic image was obtained. The catheter was locked, secured to the skin with an interrupted suture and reconnected to a gravity bag. The identical repeat procedure was repeated for the contralateral right-sided nephrostomy, ultimately allowing successful exchange of a new 10.2 [place] all-purpose drainage catheter with end coiled and locked within the right renal pelvis. The right-sided ureter remains occluded in its midportion. Dressings were placed. The patient tolerated the above procedures well without immediate postprocedural complication. FINDINGS: The existing nephrostomy catheters are appropriately positioned and functioning. Patent left ureter. The right ureter appears obstructed at the midportion. After successful fluoroscopic guided exchange, new bilateral 10.2French nephrostomy catheters are coiled and locked within the respective renal pelvises. IMPRESSION:  Successful fluoroscopic guided exchange of bilateral 10.2 French percutaneous nephrostomy catheters. PLAN: After discussion  with Dr. Grayland Ormond, the patient will return in 1 month for left-sided nephroureteral stent placement. The right ureteral patency will be reassessed at this time, and if patent, right-sided nephroureteral stent placement may also be pursued. [name], MD [facility] Radiology Electronically Signed   By: [name] MD   On: 07/11/2020 09:49    ASSESSMENT: Metastatic colorectal adenocarcinoma,  KRAS wild-type.  PLAN:   1. Metastatic colorectal adenocarcinoma: Although unclear whether malignancy originated in colon or rectum, given the pattern of spread to liver, will treat as a stage IV colon cancer.  CEA is only mildly elevated at 9.9.  Patient has had port placement.  Initial plan was to treat with FOLFOX plus Avastin every 2 weeks.  Can consider Panitumumab as second line.  Although rectal bleeding has stopped, will continue to hold Avastin and added to regimen with cycle 7.  Proceed with cycle 5 of FOLFOX today.  Return to clinic in 2 days for pump removal and Udenyca.  Patient will then return to clinic in 2 weeks for further evaluation and consideration of cycle 6.  Plan to reimage occlusion of cycle 6.  Appreciate palliative care and genetics input.  2.  Anemia: Chronic and unchanged.  Patient's hemoglobin is 9.1.  Continue to hold Avastin as above. 3.  Renal insufficiency: Improved.  Creatinine 1.11 today.   4.  Neutropenia: Udenyca as needed for remainder of cycles if needed. 5.  Hypokalemia: Resolved. 6.  Congestion/sinus drainage: Resolved. 7.  Hypotension: Resolved.  Patient expressed understanding and was in agreement with this plan. She also understands that She can call clinic at any time with any questions, concerns, or complaints.   Cancer Staging Metastatic colorectal cancer [facility]) Staging form: Colon and Rectum, AJCC 8th Edition - Clinical stage from 05/31/2020: Stage IVC (cTX, cNX, pM1c) - Signed by [name], MD on  05/31/2020   [name], MD   08/07/2020 6:18 AM

## 2020-08-06 ENCOUNTER — Other Ambulatory Visit: Payer: Self-pay | Admitting: *Deleted

## 2020-08-06 ENCOUNTER — Inpatient Hospital Stay: Payer: Medicaid Other | Attending: Oncology

## 2020-08-06 ENCOUNTER — Other Ambulatory Visit: Payer: Self-pay

## 2020-08-06 ENCOUNTER — Inpatient Hospital Stay (HOSPITAL_BASED_OUTPATIENT_CLINIC_OR_DEPARTMENT_OTHER): Payer: Medicaid Other | Admitting: Oncology

## 2020-08-06 ENCOUNTER — Inpatient Hospital Stay: Payer: Medicaid Other

## 2020-08-06 ENCOUNTER — Encounter: Payer: Self-pay | Admitting: Oncology

## 2020-08-06 VITALS — BP 109/94 | HR 85 | Temp 97.6°F | Wt 134.2 lb

## 2020-08-06 DIAGNOSIS — D649 Anemia, unspecified: Secondary | ICD-10-CM | POA: Insufficient documentation

## 2020-08-06 DIAGNOSIS — Z8249 Family history of ischemic heart disease and other diseases of the circulatory system: Secondary | ICD-10-CM | POA: Diagnosis not present

## 2020-08-06 DIAGNOSIS — C19 Malignant neoplasm of rectosigmoid junction: Secondary | ICD-10-CM

## 2020-08-06 DIAGNOSIS — Z800 Family history of malignant neoplasm of digestive organs: Secondary | ICD-10-CM | POA: Diagnosis not present

## 2020-08-06 DIAGNOSIS — R109 Unspecified abdominal pain: Secondary | ICD-10-CM | POA: Insufficient documentation

## 2020-08-06 DIAGNOSIS — Z823 Family history of stroke: Secondary | ICD-10-CM | POA: Insufficient documentation

## 2020-08-06 DIAGNOSIS — Z5189 Encounter for other specified aftercare: Secondary | ICD-10-CM | POA: Diagnosis not present

## 2020-08-06 DIAGNOSIS — I959 Hypotension, unspecified: Secondary | ICD-10-CM | POA: Insufficient documentation

## 2020-08-06 DIAGNOSIS — D709 Neutropenia, unspecified: Secondary | ICD-10-CM | POA: Diagnosis not present

## 2020-08-06 DIAGNOSIS — R5383 Other fatigue: Secondary | ICD-10-CM | POA: Insufficient documentation

## 2020-08-06 DIAGNOSIS — Z809 Family history of malignant neoplasm, unspecified: Secondary | ICD-10-CM | POA: Insufficient documentation

## 2020-08-06 DIAGNOSIS — Z8049 Family history of malignant neoplasm of other genital organs: Secondary | ICD-10-CM | POA: Diagnosis not present

## 2020-08-06 DIAGNOSIS — Z5111 Encounter for antineoplastic chemotherapy: Secondary | ICD-10-CM | POA: Diagnosis not present

## 2020-08-06 DIAGNOSIS — R531 Weakness: Secondary | ICD-10-CM | POA: Diagnosis not present

## 2020-08-06 DIAGNOSIS — Z79899 Other long term (current) drug therapy: Secondary | ICD-10-CM | POA: Diagnosis not present

## 2020-08-06 DIAGNOSIS — N289 Disorder of kidney and ureter, unspecified: Secondary | ICD-10-CM | POA: Diagnosis not present

## 2020-08-06 DIAGNOSIS — Z833 Family history of diabetes mellitus: Secondary | ICD-10-CM | POA: Diagnosis not present

## 2020-08-06 LAB — URINALYSIS, DIPSTICK ONLY
Bilirubin Urine: NEGATIVE
Glucose, UA: NEGATIVE mg/dL
Ketones, ur: NEGATIVE mg/dL
Nitrite: NEGATIVE
Protein, ur: 100 mg/dL — AB
Specific Gravity, Urine: 1.003 — ABNORMAL LOW (ref 1.005–1.030)
pH: 6 (ref 5.0–8.0)

## 2020-08-06 LAB — COMPREHENSIVE METABOLIC PANEL
ALT: 15 U/L (ref 0–44)
AST: 25 U/L (ref 15–41)
Albumin: 2.7 g/dL — ABNORMAL LOW (ref 3.5–5.0)
Alkaline Phosphatase: 66 U/L (ref 38–126)
Anion gap: 12 (ref 5–15)
BUN: 14 mg/dL (ref 6–20)
CO2: 20 mmol/L — ABNORMAL LOW (ref 22–32)
Calcium: 8.1 mg/dL — ABNORMAL LOW (ref 8.9–10.3)
Chloride: 104 mmol/L (ref 98–111)
Creatinine, Ser: 1.11 mg/dL — ABNORMAL HIGH (ref 0.44–1.00)
GFR, Estimated: 60 mL/min — ABNORMAL LOW (ref >60)
Glucose, Bld: 87 mg/dL (ref 70–99)
Potassium: 3.8 mmol/L (ref 3.5–5.1)
Sodium: 136 mmol/L (ref 135–145)
Total Bilirubin: 0.3 mg/dL (ref 0.3–1.2)
Total Protein: 6.3 g/dL — ABNORMAL LOW (ref 6.5–8.1)

## 2020-08-06 LAB — CBC WITH DIFFERENTIAL/PLATELET
Abs Immature Granulocytes: 0.49 10*3/uL — ABNORMAL HIGH (ref 0.00–0.07)
Basophils Absolute: 0.1 10*3/uL (ref 0.0–0.1)
Basophils Relative: 1 %
Eosinophils Absolute: 0 10*3/uL (ref 0.0–0.5)
Eosinophils Relative: 0 %
HCT: 28.6 % — ABNORMAL LOW (ref 36.0–46.0)
Hemoglobin: 9.1 g/dL — ABNORMAL LOW (ref 12.0–15.0)
Immature Granulocytes: 5 %
Lymphocytes Relative: 22 %
Lymphs Abs: 2.2 10*3/uL (ref 0.7–4.0)
MCH: 29.9 pg (ref 26.0–34.0)
MCHC: 31.8 g/dL (ref 30.0–36.0)
MCV: 94.1 fL (ref 80.0–100.0)
Monocytes Absolute: 1.1 10*3/uL — ABNORMAL HIGH (ref 0.1–1.0)
Monocytes Relative: 11 %
Neutro Abs: 6.3 10*3/uL (ref 1.7–7.7)
Neutrophils Relative %: 61 %
Platelets: 216 10*3/uL (ref 150–400)
RBC: 3.04 MIL/uL — ABNORMAL LOW (ref 3.87–5.11)
RDW: 22.3 % — ABNORMAL HIGH (ref 11.5–15.5)
WBC: 10.2 10*3/uL (ref 4.0–10.5)
nRBC: 0.2 % (ref 0.0–0.2)

## 2020-08-06 MED ORDER — FLUOROURACIL CHEMO INJECTION 5 GM/100ML
2400.0000 mg/m2 | INTRAVENOUS | Status: DC
  Administered 2020-08-06: 3750 mg via INTRAVENOUS
  Filled 2020-08-06: qty 75

## 2020-08-06 MED ORDER — FLUOROURACIL CHEMO INJECTION 2.5 GM/50ML
400.0000 mg/m2 | Freq: Once | INTRAVENOUS | Status: AC
  Administered 2020-08-06: 650 mg via INTRAVENOUS
  Filled 2020-08-06: qty 13

## 2020-08-06 MED ORDER — DEXTROSE 5 % IV SOLN
Freq: Once | INTRAVENOUS | Status: AC
  Filled 2020-08-06: qty 250

## 2020-08-06 MED ORDER — SODIUM CHLORIDE 0.9% FLUSH
10.0000 mL | Freq: Once | INTRAVENOUS | Status: AC
  Administered 2020-08-06: 10 mL via INTRAVENOUS
  Filled 2020-08-06: qty 10

## 2020-08-06 MED ORDER — PALONOSETRON HCL INJECTION 0.25 MG/5ML
0.2500 mg | Freq: Once | INTRAVENOUS | Status: AC
  Administered 2020-08-06: 0.25 mg via INTRAVENOUS
  Filled 2020-08-06: qty 5

## 2020-08-06 MED ORDER — HYDROCODONE-ACETAMINOPHEN 5-325 MG PO TABS: 1.0000 | ORAL_TABLET | Freq: Four times a day (QID) | ORAL | 0 refills | Status: DC | PRN

## 2020-08-06 MED ORDER — SODIUM CHLORIDE 0.9% FLUSH
10.0000 mL | INTRAVENOUS | Status: DC | PRN
  Administered 2020-08-06: 10 mL
  Filled 2020-08-06: qty 10

## 2020-08-06 MED ORDER — SODIUM CHLORIDE 0.9 % IV SOLN
10.0000 mg | Freq: Once | INTRAVENOUS | Status: AC
  Administered 2020-08-06: 10 mg via INTRAVENOUS
  Filled 2020-08-06: qty 1

## 2020-08-06 MED ORDER — OXALIPLATIN CHEMO INJECTION 100 MG/20ML
85.0000 mg/m2 | Freq: Once | INTRAVENOUS | Status: AC
  Administered 2020-08-06: 135 mg via INTRAVENOUS
  Filled 2020-08-06: qty 10

## 2020-08-06 MED ORDER — LEUCOVORIN CALCIUM INJECTION 350 MG
650.0000 mg | Freq: Once | INTRAMUSCULAR | Status: AC
Start: 2020-08-06 — End: 2020-08-06
  Administered 2020-08-06: 650 mg via INTRAVENOUS
  Filled 2020-08-06: qty 32.5

## 2020-08-06 NOTE — Progress Notes (Signed)
Pt was having pain above the kidney tube on left side, changed dressing and there was no issues. The spot hurting her is 1 inch above the site. Pt to get pain med to take if needed

## 2020-08-06 NOTE — Progress Notes (Signed)
FOLFOX well tolerated. Discharged home in stable condition. Return Friday for 5FU pump d/c.

## 2020-08-08 ENCOUNTER — Inpatient Hospital Stay: Payer: Medicaid Other

## 2020-08-08 VITALS — BP 90/62 | HR 84 | Temp 97.6°F

## 2020-08-08 DIAGNOSIS — Z823 Family history of stroke: Secondary | ICD-10-CM | POA: Diagnosis not present

## 2020-08-08 DIAGNOSIS — Z79899 Other long term (current) drug therapy: Secondary | ICD-10-CM | POA: Diagnosis not present

## 2020-08-08 DIAGNOSIS — N289 Disorder of kidney and ureter, unspecified: Secondary | ICD-10-CM | POA: Diagnosis not present

## 2020-08-08 DIAGNOSIS — D649 Anemia, unspecified: Secondary | ICD-10-CM | POA: Diagnosis not present

## 2020-08-08 DIAGNOSIS — Z5111 Encounter for antineoplastic chemotherapy: Secondary | ICD-10-CM | POA: Diagnosis not present

## 2020-08-08 DIAGNOSIS — R531 Weakness: Secondary | ICD-10-CM | POA: Diagnosis not present

## 2020-08-08 DIAGNOSIS — Z833 Family history of diabetes mellitus: Secondary | ICD-10-CM | POA: Diagnosis not present

## 2020-08-08 DIAGNOSIS — R5383 Other fatigue: Secondary | ICD-10-CM | POA: Diagnosis not present

## 2020-08-08 DIAGNOSIS — R109 Unspecified abdominal pain: Secondary | ICD-10-CM | POA: Diagnosis not present

## 2020-08-08 DIAGNOSIS — Z8049 Family history of malignant neoplasm of other genital organs: Secondary | ICD-10-CM | POA: Diagnosis not present

## 2020-08-08 DIAGNOSIS — Z5189 Encounter for other specified aftercare: Secondary | ICD-10-CM | POA: Diagnosis not present

## 2020-08-08 DIAGNOSIS — Z8249 Family history of ischemic heart disease and other diseases of the circulatory system: Secondary | ICD-10-CM | POA: Diagnosis not present

## 2020-08-08 DIAGNOSIS — Z800 Family history of malignant neoplasm of digestive organs: Secondary | ICD-10-CM | POA: Diagnosis not present

## 2020-08-08 DIAGNOSIS — C19 Malignant neoplasm of rectosigmoid junction: Secondary | ICD-10-CM

## 2020-08-08 DIAGNOSIS — I959 Hypotension, unspecified: Secondary | ICD-10-CM | POA: Diagnosis not present

## 2020-08-08 DIAGNOSIS — D709 Neutropenia, unspecified: Secondary | ICD-10-CM | POA: Diagnosis not present

## 2020-08-08 DIAGNOSIS — Z809 Family history of malignant neoplasm, unspecified: Secondary | ICD-10-CM | POA: Diagnosis not present

## 2020-08-08 MED ORDER — HEPARIN SOD (PORK) LOCK FLUSH 100 UNIT/ML IV SOLN
INTRAVENOUS | Status: AC
  Filled 2020-08-08: qty 5

## 2020-08-08 MED ORDER — SODIUM CHLORIDE 0.9% FLUSH
10.0000 mL | INTRAVENOUS | Status: DC | PRN
  Filled 2020-08-08: qty 10

## 2020-08-08 MED ORDER — HEPARIN SOD (PORK) LOCK FLUSH 100 UNIT/ML IV SOLN
500.0000 [IU] | Freq: Once | INTRAVENOUS | Status: AC | PRN
  Administered 2020-08-08: 500 [IU]
  Filled 2020-08-08: qty 5

## 2020-08-08 MED ORDER — PEGFILGRASTIM-CBQV 6 MG/0.6ML ~~LOC~~ SOSY
6.0000 mg | PREFILLED_SYRINGE | Freq: Once | SUBCUTANEOUS | Status: AC
  Administered 2020-08-08: 6 mg via SUBCUTANEOUS
  Filled 2020-08-08: qty 0.6

## 2020-08-08 NOTE — Progress Notes (Signed)
Pt experiencing more cold sensitivity after this last tx, pt received udenyca, and d/ced home

## 2020-08-15 ENCOUNTER — Other Ambulatory Visit: Payer: Self-pay

## 2020-08-15 ENCOUNTER — Ambulatory Visit
Admission: RE | Admit: 2020-08-15 | Discharge: 2020-08-15 | Disposition: A | Discharge status: Home / Self Care | Payer: Medicaid Other | Source: Ambulatory Visit | Attending: Oncology | Admitting: Oncology

## 2020-08-15 DIAGNOSIS — C19 Malignant neoplasm of rectosigmoid junction: Secondary | ICD-10-CM | POA: Diagnosis not present

## 2020-08-15 DIAGNOSIS — Z436 Encounter for attention to other artificial openings of urinary tract: Secondary | ICD-10-CM | POA: Diagnosis not present

## 2020-08-15 DIAGNOSIS — Z936 Other artificial openings of urinary tract status: Secondary | ICD-10-CM | POA: Insufficient documentation

## 2020-08-15 DIAGNOSIS — N1330 Unspecified hydronephrosis: Secondary | ICD-10-CM | POA: Insufficient documentation

## 2020-08-15 HISTORY — PX: IR URETERAL STENT RIGHT NEW ACCESS W/O SEP NEPHROSTOMY CATH: IMG6076

## 2020-08-15 HISTORY — PX: IR URETERAL STENT LEFT NEW ACCESS W/O SEP NEPHROSTOMY CATH: IMG6075

## 2020-08-15 MED ORDER — IODIXANOL 320 MG/ML IV SOLN
50.0000 mL | Freq: Once | INTRAVENOUS | Status: AC
  Administered 2020-08-15: 11:00:00 15 mL

## 2020-08-15 MED ORDER — MIDAZOLAM HCL 2 MG/2ML IJ SOLN
INTRAMUSCULAR | Status: AC | PRN
  Administered 2020-08-15 (×2): 1 mg via INTRAVENOUS

## 2020-08-15 MED ORDER — FENTANYL CITRATE (PF) 100 MCG/2ML IJ SOLN
INTRAMUSCULAR | Status: AC | PRN
Start: 2020-08-15 — End: 2020-08-15
  Administered 2020-08-15 (×2): 50 ug via INTRAVENOUS

## 2020-08-15 MED ORDER — FENTANYL CITRATE (PF) 100 MCG/2ML IJ SOLN
INTRAMUSCULAR | Status: AC
  Filled 2020-08-15: qty 2

## 2020-08-15 MED ORDER — MIDAZOLAM HCL 2 MG/2ML IJ SOLN
INTRAMUSCULAR | Status: AC
  Filled 2020-08-15: qty 2

## 2020-08-15 MED ORDER — SODIUM CHLORIDE 0.9 % IV SOLN: INTRAVENOUS | Status: DC

## 2020-08-15 NOTE — Procedures (Signed)
Interventional Radiology Procedure Note  Procedure: removal of bilateral pcns  Successful bilateral 16fr x 26cm ureteral stents inserted    Complications: None  Estimated Blood Loss:  0  Findings: Full report in pacs     Tamera Punt, MD

## 2020-08-15 NOTE — H&P (Signed)
Chief Complaint: Bilateral hydronephrosis. Request is for nephrostomy tube exchange possible internalization.  Referring Physician(s): [name],[name]  Supervising Physician: Daryll Brod  Patient Status: [facility] - Out-pt  History of Present Illness: Anita Sullivan is a 55 y.o. female Presented to the ED at [facility] in October 2021 with intermittent rectal bleeding  x 3 months  found to have metastatic colorectal adenocarcinoma and bilateral hydronephrosis, AKI and anemia. On 10.22.21  IR placed bilateral nephrostomy tubes. Last exchanged on 12.10.21 where the left ureter appeared to be patent and the there was an obstruction noted to the right ureter. patient presents for bilateral conversion from nephrostomy to nephroureteral catheters.   Currently without any significant complaints. Patient alert and laying in bed, calm and comfortable. Denies any fevers, headache, chest pain, SOB, cough, abdominal pain, nausea, vomiting or bleeding. Patient reports "soreness" at the left nephrostomy tube site which has been presents since placement. Patient reports spontaneous urination. Return precautions and treatment recommendations and follow-up discussed with the patient who is agreeable with the plan.   Past Medical History:  Diagnosis Date  . Colon cancer ([place])   . Family history of pancreatic cancer   . Family history of uterine cancer     Past Surgical History:  Procedure Laterality Date  . COLONOSCOPY WITH PROPOFOL N/A 05/27/2020   Procedure: COLONOSCOPY WITH PROPOFOL;  Surgeon: [name], MD;  Location: [facility] ENDOSCOPY;  Service: Gastroenterology;  Laterality: N/A;  . FLEXIBLE SIGMOIDOSCOPY N/A 05/28/2020   Procedure: FLEXIBLE SIGMOIDOSCOPY;  Surgeon: [name], MD;  Location: [facility] ENDOSCOPY;  Service: Gastroenterology;  Laterality: N/A;  . IR NEPHROSTOMY EXCHANGE LEFT  07/11/2020  . IR NEPHROSTOMY EXCHANGE RIGHT  07/11/2020  . IR NEPHROSTOMY PLACEMENT LEFT   05/23/2020  . IR NEPHROSTOMY PLACEMENT RIGHT  05/23/2020  . PORTACATH PLACEMENT Left 05/29/2020   Procedure: INSERTION PORT-A-CATH;  Surgeon: [name], MD;  Location: [facility] ORS;  Service: General;  Laterality: Left;    Allergies: Patient has no known allergies.  Medications: Prior to Admission medications   Medication Sig Start Date End Date Taking? Authorizing Provider  HYDROcodone-acetaminophen (NORCO) 5-325 MG tablet Take 1 tablet by mouth every 6 (six) hours as needed for moderate pain. 08/06/20   [name], MD  ondansetron (ZOFRAN) 4 MG tablet Take 4 mg by mouth every 8 (eight) hours as needed for nausea or vomiting.    [provider]  pantoprazole (PROTONIX) 40 MG tablet Take 1 tablet (40 mg total) by mouth 2 (two) times daily. 06/01/20   Swayze, Ava, DO  prochlorperazine (COMPAZINE) 10 MG tablet Take 1 tablet (10 mg total) by mouth every 6 (six) hours as needed (Nausea or vomiting). 06/04/20 06/12/20  [name], MD     Family History  Problem Relation Age of Onset  . Other Mother        Corticobasal degeneration  . Uterine cancer Mother   . Diabetes Maternal Grandmother   . Stroke Maternal Grandfather   . Stroke Paternal Grandfather   . Heart disease Father   . Pancreatic cancer Maternal Uncle 70  . Cancer Paternal Aunt        unk type  . Liver cancer Maternal Uncle   . Pancreatic cancer Cousin   . Cancer Cousin        unk type    Social History   Socioeconomic History  . Marital status: Divorced    Spouse name: Not on file  . Number of children: 1  .  Years of education: Not on file  . Highest education level: Not on file  Occupational History  . Not on file  Tobacco Use  . Smoking status: Never Smoker  . Smokeless tobacco: Never Used  Vaping Use  . Vaping Use: Never used  Substance and Sexual Activity  . Alcohol use: Never  . Drug use: Never  . Sexual activity: Not Currently  Other Topics Concern  . Not on file  Social  History Narrative   Lives with dad, 1 daughter and 2 dogs   Social Determinants of Health   Financial Resource Strain: Not on file  Food Insecurity: Not on file  Transportation Needs: Not on file  Physical Activity: Not on file  Stress: Not on file  Social Connections: Not on file    Review of Systems: A 12 point ROS discussed and pertinent positives are indicated in the HPI above.  All other systems are negative.  Review of Systems  Constitutional: Negative for fatigue and fever.  HENT: Negative for congestion.   Respiratory: Negative for cough and shortness of breath.   Gastrointestinal: Negative for abdominal pain, diarrhea, nausea and vomiting.  Genitourinary: Positive for flank pain ( soreness at left nephrostomy tube site.).    Vital Signs: BP 102/74   Pulse 78   Resp 20   Ht 5' (1.524 m)   Wt 134 lb 4.2 oz (60.9 kg)   SpO2 100%   BMI 26.22 kg/m   Physical Exam Vitals and nursing note reviewed.  Constitutional:      Appearance: She is well-developed and well-nourished.  HENT:     Head: Normocephalic and atraumatic.  Eyes:     Conjunctiva/sclera: Conjunctivae normal.  Cardiovascular:     Rate and Rhythm: Normal rate and regular rhythm.     Heart sounds: Normal heart sounds.  Pulmonary:     Effort: Pulmonary effort is normal.  Musculoskeletal:     Cervical back: Normal range of motion.  Neurological:     Mental Status: She is alert and oriented to person, place, and time.  Psychiatric:        Mood and Affect: Mood and affect normal.     Imaging: No results found.  Labs:  CBC: Recent Labs    07/02/20 0829 07/09/20 0817 07/22/20 0842 08/06/20 0857  WBC 1.9* 5.5 8.2 10.2  HGB 8.8* 8.6* 9.1* 9.1*  HCT 27.5* 26.6* 28.1* 28.6*  PLT 200 230 267 216    COAGS: Recent Labs    05/22/20 2313 05/26/20 0946  INR 1.2 1.1    BMP: Recent Labs    07/02/20 0829 07/09/20 0817 07/22/20 0842 08/06/20 0857  NA 133* 138 135 136  K 4.2 3.9 4.0 3.8   CL 98 103 101 104  CO2 24 26 26  20*  GLUCOSE 98 88 82 87  BUN 16 13 13 14   CALCIUM 8.4* 8.3* 8.4* 8.1*  CREATININE 1.21* 1.15* 1.21* 1.11*  GFRNONAA 54* 57* 54* 60*    LIVER FUNCTION TESTS: Recent Labs    07/02/20 0829 07/09/20 0817 07/22/20 0842 08/06/20 0857  BILITOT 0.7 0.4 0.3 0.3  AST 25 20 42* 25  ALT 13 10 39 15  ALKPHOS 42 43 62 66  PROT 7.3 6.4* 6.9 6.3*  ALBUMIN 3.0* 2.6* 2.9* 2.7*    Assessment and Plan:  55 y.o. female outpatient. Presented to the ED at [facility] in October 2021 with intermittent rectal bleeding  x 3 months  found to have metastatic colorectal adenocarcinoma and bilateral  hydronephrosis, AKI and anemia. On 10.22.21  IR placed bilateral nephrostomy tubes. Last exchanged on 12.10.21 where the left ureter appeared to be patent and the there was an obstruction noted to the right ureter. patient presents for bilateral conversion from nephrostomy to nephroureteral catheters.   All labs and medications are within acceptable parameters. NKDA. NPO since midnight.  Risks and benefits of bilateral PCN exchange possible internalization was discussed with the patient including, but not limited to, infection, bleeding, significant bleeding causing loss or decrease in renal function or damage to adjacent structures.   All of the patient's questions were answered, patient is agreeable to proceed.  Consent signed and in chart.   Thank you for this interesting consult.  I greatly enjoyed meeting Anita Sullivan and look forward to participating in their care.  A copy of this report was sent to the requesting provider on this date.  Electronically Signed: Jacqualine Mau, NP 08/15/2020, 9:06 AM   I spent a total of    15 Minutes in face to face in clinical consultation, greater than 50% of which was counseling/coordinating care for Nephrostomy tube exchange possible internalization

## 2020-08-16 NOTE — Progress Notes (Unsigned)
[place]  [phone] [phone]) [phone]  ID: Anita Sullivan OB: 1966/05/05  MR#: [id number]  DDU#:[id number]  Patient Care Team: Valerie Roys, DO as PCP - General (Family Medicine) [name], MD as Consulting Physician (Oncology) [name], RN as Oncology Nurse Navigator  CHIEF COMPLAINT: Metastatic colorectal adenocarcinoma, KRAS wild-type  INTERVAL HISTORY: Patient returns to clinic today for further evaluation and consideration of cycle 6 of FOLFOX.  She continues to have chronic weakness and fatigue, but otherwise feels well.  Her nephrostomy tubes were internalized recently and she no longer is complaining of pain.  She no longer has rectal bleeding.  She has no neurologic complaints.  She has a fair appetite, but denies weight loss.  She denies any chest pain, shortness of breath, cough, or hemoptysis.  She denies any nausea, vomiting, constipation, or diarrhea.  She has no urinary complaints.  Patient offers no further specific complaints today.  REVIEW OF SYSTEMS:   Review of Systems  Constitutional: Positive for malaise/fatigue. Negative for fever and weight loss.  HENT: Negative.  Negative for congestion.   Respiratory: Negative.  Negative for cough, hemoptysis and shortness of breath.   Cardiovascular: Negative.  Negative for chest pain and leg swelling.  Gastrointestinal: Negative for blood in stool, constipation, nausea and vomiting.  Genitourinary: Negative.  Negative for flank pain and hematuria.  Musculoskeletal: Negative.  Negative for back pain.  Skin: Negative.  Negative for rash.  Neurological: Positive for weakness. Negative for dizziness, seizures and headaches.  Psychiatric/Behavioral: Negative.  The patient is not nervous/anxious.     As per HPI. Otherwise, a complete review of systems is negative.  PAST MEDICAL HISTORY: Past Medical History:  Diagnosis Date  . Colon cancer ([place])   . Family history  of pancreatic cancer   . Family history of uterine cancer     PAST SURGICAL HISTORY: Past Surgical History:  Procedure Laterality Date  . COLONOSCOPY WITH PROPOFOL N/A 05/27/2020   Procedure: COLONOSCOPY WITH PROPOFOL;  Surgeon: [name], MD;  Location: [facility] ENDOSCOPY;  Service: Gastroenterology;  Laterality: N/A;  . FLEXIBLE SIGMOIDOSCOPY N/A 05/28/2020   Procedure: FLEXIBLE SIGMOIDOSCOPY;  Surgeon: [name], MD;  Location: [facility] ENDOSCOPY;  Service: Gastroenterology;  Laterality: N/A;  . IR NEPHROSTOMY EXCHANGE LEFT  07/11/2020  . IR NEPHROSTOMY EXCHANGE RIGHT  07/11/2020  . IR NEPHROSTOMY PLACEMENT LEFT  05/23/2020  . IR NEPHROSTOMY PLACEMENT RIGHT  05/23/2020  . IR URETERAL STENT LEFT NEW ACCESS W/O SEP NEPHROSTOMY CATH  08/15/2020  . IR URETERAL STENT RIGHT NEW ACCESS W/O SEP NEPHROSTOMY CATH  08/15/2020  . PORTACATH PLACEMENT Left 05/29/2020   Procedure: INSERTION PORT-A-CATH;  Surgeon: [name], MD;  Location: [facility] ORS;  Service: General;  Laterality: Left;    FAMILY HISTORY: Family History  Problem Relation Age of Onset  . Other Mother        Corticobasal degeneration  . Uterine cancer Mother   . Diabetes Maternal Grandmother   . Stroke Maternal Grandfather   . Stroke Paternal Grandfather   . Heart disease Father   . Pancreatic cancer Maternal Uncle 43  . Cancer Paternal Aunt        unk type  . Liver cancer Maternal Uncle   . Pancreatic cancer Cousin   . Cancer Cousin        unk type    ADVANCED DIRECTIVES (Y/N):  N  HEALTH MAINTENANCE: Social History   Tobacco Use  . Smoking status: Never Smoker  .  Smokeless tobacco: Never Used  Vaping Use  . Vaping Use: Never used  Substance Use Topics  . Alcohol use: Never  . Drug use: Never     Colonoscopy:  PAP:  Bone density:  Lipid panel:  No Known Allergies  Current Outpatient Medications  Medication Sig Dispense Refill  . pantoprazole (PROTONIX) 40 MG tablet Take 1 tablet (40 mg  total) by mouth 2 (two) times daily. 60 tablet 0  . HYDROcodone-acetaminophen (NORCO) 5-325 MG tablet Take 1 tablet by mouth every 6 (six) hours as needed for moderate pain. (Patient not taking: No sig reported) 30 tablet 0  . ondansetron (ZOFRAN) 4 MG tablet Take 4 mg by mouth every 8 (eight) hours as needed for nausea or vomiting. (Patient not taking: No sig reported)     No current facility-administered medications for this visit.   Facility-Administered Medications Ordered in Other Visits  Medication Dose Route Frequency Provider Last Rate Last Admin  . dexamethasone (DECADRON) 10 mg in sodium chloride 0.9 % 50 mL IVPB  10 mg Intravenous Once [name], MD      . fluorouracil (ADRUCIL) 3,750 mg in sodium chloride 0.9 % 75 mL chemo infusion  2,400 mg/m2 (Treatment Plan Recorded) Intravenous 1 day or 1 dose [name], MD      . fluorouracil (ADRUCIL) chemo injection 650 mg  400 mg/m2 (Treatment Plan Recorded) Intravenous Once [name], MD      . leucovorin 650 mg in dextrose 5 % 250 mL infusion  650 mg Intravenous Once [name], MD      . oxaliplatin (ELOXATIN) 135 mg in dextrose 5 % 500 mL chemo infusion  85 mg/m2 (Treatment Plan Recorded) Intravenous Once [name], MD      . palonosetron (ALOXI) injection 0.25 mg  0.25 mg Intravenous Once [name], MD        OBJECTIVE: Vitals:   08/20/20 0911  BP: (!) 85/59  Pulse: 82  Resp: 20  Temp: 98.7 F (37.1 C)  SpO2: 99%     Body mass index is 24.86 kg/m.    ECOG FS:1 - Symptomatic but completely ambulatory  General: Well-developed, well-nourished, no acute distress. Eyes: Pink conjunctiva, anicteric sclera. HEENT: Normocephalic, moist mucous membranes. Lungs: No audible wheezing or coughing. Heart: Regular rate and rhythm. Abdomen: Soft, nontender, no obvious distention. Musculoskeletal: No edema, cyanosis, or clubbing. Neuro: Alert, answering all questions appropriately.  Cranial nerves grossly intact. Skin: No rashes or petechiae noted. Psych: Normal affect.  LAB RESULTS:  Lab Results  Component Value Date   NA 135 08/20/2020   K 3.7 08/20/2020   CL 101 08/20/2020   CO2 22 08/20/2020   GLUCOSE 98 08/20/2020   BUN 16 08/20/2020   CREATININE 1.14 (H) 08/20/2020   CALCIUM 8.1 (L) 08/20/2020   PROT 7.0 08/20/2020   ALBUMIN 2.9 (L) 08/20/2020   AST 31 08/20/2020   ALT 12 08/20/2020   ALKPHOS 56 08/20/2020   BILITOT 0.2 (L) 08/20/2020   GFRNONAA 58 (L) 08/20/2020    Lab Results  Component Value Date   WBC 5.7 08/20/2020   NEUTROABS 3.2 08/20/2020   HGB 9.1 (L) 08/20/2020   HCT 27.3 (L) 08/20/2020   MCV 94.8 08/20/2020   PLT 154 08/20/2020     STUDIES: IR URETERAL STENT LEFT NEW ACCESS W/O SEP NEPHROSTOMY CATH  Result Date: 08/15/2020 INDICATION: Metastatic colorectal cancer, bilateral external for ostomy EXAM: Removal of the nephrostomies Bilateral antegrade nephrostogram Successful placement  of bilateral 8 x 26 internal ureteral stents. COMPARISON:  07/11/2020 MEDICATIONS: 1% lidocaine local ANESTHESIA/SEDATION: Fentanyl 100 mcg IV; Versed 2.0 mg IV Moderate Sedation Time:  30 minutes The patient was continuously monitored during the procedure by the interventional radiology nurse under my direct supervision. CONTRAST:  20 cc-administered into the collecting system(s) FLUOROSCOPY TIME:  Fluoroscopy Time: 7 minutes 0 seconds (29 mGy). COMPLICATIONS: None immediate. PROCEDURE: Informed written consent was obtained from the patient after a thorough discussion of the procedural risks, benefits and alternatives. All questions were addressed. Maximal Sterile Barrier Technique was utilized including caps, mask, sterile gowns, sterile gloves, sterile drape, hand hygiene and skin antiseptic. A timeout was performed prior to the initiation of the procedure. LEFT NEPHROSTOMY CONVERSION TO URETERAL STENT: Initial nephrostogram performed through the existing  left nephrostomy. This demonstrates patency of the left ureter. There is mild left hydroureteronephrosis extending to the pelvic level. Below this, there is ureteral narrowing but the pelvic portion of the ureter is patent to the bladder. Catheter was cut and removed over a Bentson guidewire. Kumpe catheter utilized to advance the access through the distal left ureteral stricture into the bladder. Contrast injection confirms position in the bladder. Measurements obtained for the appropriate stent length. Amplatz guidewire inserted. Seven French sheath advanced followed by placement of a Bentson safety guidewire alongside the Amplatz guidewire. Over the Amplatz guidewire, an 8 French x 26 cm ureteral stent was advanced with the distal loop formed in the bladder and the proximal loop in the renal pelvis. Position confirmed with fluoroscopy. Contrast injection confirms antegrade flow through the stent. External access and safety guidewire removed. Images obtained for documentation. RIGHT NEPHROSTOMY CONVERSION TO URETERAL STENT: In a similar fashion, the right nephrostomy catheter was injected for antegrade nephrostogram. There is similar hydronephrosis and hydroureter to the pelvic level. Pelvic portion of the ureter demonstrates diffuse narrowing without occlusion. Contrast does drain into the bladder. Catheter was cut and removed over a Bentson guidewire. Kumpe catheter and Bentson guidewire advanced into the bladder. Seven French sheath advanced. Amplatz guidewire also advanced into the bladder. Over the Amplatz guidewire, a 8 French 26 cm ureteral stent was advanced with the distal loop formed in the bladder and the proximal loop in the renal pelvis. Position confirmed with contrast injection. There is antegrade flow through the stent. Safety guidewire and external access removed. Final imaging obtained. IMPRESSION: Successful conversion of the bilateral nephrostomies to internal ureteral stents as above. External  nephrostomy access removed completely. Electronically Signed   By: Jerilynn Mages.  Shick M.D.   On: 08/15/2020 11:41   IR URETERAL STENT RIGHT NEW ACCESS W/O SEP NEPHROSTOMY CATH  Result Date: 08/15/2020 INDICATION: Metastatic colorectal cancer, bilateral external for ostomy EXAM: Removal of the nephrostomies Bilateral antegrade nephrostogram Successful placement of bilateral 8 x 26 internal ureteral stents. COMPARISON:  07/11/2020 MEDICATIONS: 1% lidocaine local ANESTHESIA/SEDATION: Fentanyl 100 mcg IV; Versed 2.0 mg IV Moderate Sedation Time:  30 minutes The patient was continuously monitored during the procedure by the interventional radiology nurse under my direct supervision. CONTRAST:  20 cc-administered into the collecting system(s) FLUOROSCOPY TIME:  Fluoroscopy Time: 7 minutes 0 seconds (29 mGy). COMPLICATIONS: None immediate. PROCEDURE: Informed written consent was obtained from the patient after a thorough discussion of the procedural risks, benefits and alternatives. All questions were addressed. Maximal Sterile Barrier Technique was utilized including caps, mask, sterile gowns, sterile gloves, sterile drape, hand hygiene and skin antiseptic. A timeout was performed prior to the initiation of the procedure. LEFT  NEPHROSTOMY CONVERSION TO URETERAL STENT: Initial nephrostogram performed through the existing left nephrostomy. This demonstrates patency of the left ureter. There is mild left hydroureteronephrosis extending to the pelvic level. Below this, there is ureteral narrowing but the pelvic portion of the ureter is patent to the bladder. Catheter was cut and removed over a Bentson guidewire. Kumpe catheter utilized to advance the access through the distal left ureteral stricture into the bladder. Contrast injection confirms position in the bladder. Measurements obtained for the appropriate stent length. Amplatz guidewire inserted. Seven French sheath advanced followed by placement of a Bentson safety guidewire  alongside the Amplatz guidewire. Over the Amplatz guidewire, an 8 French x 26 cm ureteral stent was advanced with the distal loop formed in the bladder and the proximal loop in the renal pelvis. Position confirmed with fluoroscopy. Contrast injection confirms antegrade flow through the stent. External access and safety guidewire removed. Images obtained for documentation. RIGHT NEPHROSTOMY CONVERSION TO URETERAL STENT: In a similar fashion, the right nephrostomy catheter was injected for antegrade nephrostogram. There is similar hydronephrosis and hydroureter to the pelvic level. Pelvic portion of the ureter demonstrates diffuse narrowing without occlusion. Contrast does drain into the bladder. Catheter was cut and removed over a Bentson guidewire. Kumpe catheter and Bentson guidewire advanced into the bladder. Seven French sheath advanced. Amplatz guidewire also advanced into the bladder. Over the Amplatz guidewire, a 8 French 26 cm ureteral stent was advanced with the distal loop formed in the bladder and the proximal loop in the renal pelvis. Position confirmed with contrast injection. There is antegrade flow through the stent. Safety guidewire and external access removed. Final imaging obtained. IMPRESSION: Successful conversion of the bilateral nephrostomies to internal ureteral stents as above. External nephrostomy access removed completely. Electronically Signed   By: Jerilynn Mages.  Shick M.D.   On: 08/15/2020 11:41    ASSESSMENT: Metastatic colorectal adenocarcinoma,  KRAS wild-type.  PLAN:   1. Metastatic colorectal adenocarcinoma: Although unclear whether malignancy originated in colon or rectum, given the pattern of spread to liver, will treat as a stage IV colon cancer.  CEA is only mildly elevated at 9.9.  Patient has had port placement.  Initial plan was to treat with FOLFOX plus Avastin every 2 weeks.  Can consider Panitumumab as second line.  Although rectal bleeding has stopped, will continue to hold  Avastin and added to regimen with cycle 7.  Proceed with cycle 6 of FOLFOX today.  Return to clinic in 2 days for pump removal and Udenyca.  Patient will then return to clinic in 2 weeks for further evaluation and consideration of cycle 7 which will be FOLFOX plus Avastin.  Will reimage with CT scan prior to next treatment.  Appreciate palliative care and genetics input.  2.  Anemia: Chronic and unchanged.  Patient's hemoglobin is stable at 9.1.  Add Avastin with cycle 7 as above. 3.  Renal insufficiency: Chronic and unchanged.  Patient's creatinine is 1.14 today. 4.  Neutropenia: Resolved.  Udenyca as needed for remainder of cycles if needed. 5.  Hypokalemia: Resolved. 6.  Congestion/sinus drainage: Resolved. 7.  Hypotension: Encourage fluid intake.  Proceed with treatment as above. 8.  Nephrostomy tubes: Internalized on August 15, 2020.  Patient expressed understanding and was in agreement with this plan. She also understands that She can call clinic at any time with any questions, concerns, or complaints.   Cancer Staging Metastatic colorectal cancer [facility]) Staging form: Colon and Rectum, AJCC 8th Edition - Clinical stage from 05/31/2020: Stage  IVC (cTX, cNX, pM1c) - Signed by [name], MD on 05/31/2020   [name], MD   08/20/2020 10:10 AM

## 2020-08-20 ENCOUNTER — Other Ambulatory Visit: Payer: Self-pay

## 2020-08-20 ENCOUNTER — Encounter: Payer: Self-pay | Admitting: Oncology

## 2020-08-20 ENCOUNTER — Inpatient Hospital Stay: Payer: Medicaid Other

## 2020-08-20 ENCOUNTER — Inpatient Hospital Stay (HOSPITAL_BASED_OUTPATIENT_CLINIC_OR_DEPARTMENT_OTHER): Payer: Medicaid Other | Admitting: Oncology

## 2020-08-20 VITALS — BP 85/59 | HR 82 | Temp 98.7°F | Resp 20 | Wt 127.3 lb

## 2020-08-20 DIAGNOSIS — Z8049 Family history of malignant neoplasm of other genital organs: Secondary | ICD-10-CM | POA: Diagnosis not present

## 2020-08-20 DIAGNOSIS — R5383 Other fatigue: Secondary | ICD-10-CM | POA: Diagnosis not present

## 2020-08-20 DIAGNOSIS — N289 Disorder of kidney and ureter, unspecified: Secondary | ICD-10-CM | POA: Diagnosis not present

## 2020-08-20 DIAGNOSIS — C19 Malignant neoplasm of rectosigmoid junction: Secondary | ICD-10-CM

## 2020-08-20 DIAGNOSIS — D709 Neutropenia, unspecified: Secondary | ICD-10-CM | POA: Diagnosis not present

## 2020-08-20 DIAGNOSIS — D649 Anemia, unspecified: Secondary | ICD-10-CM | POA: Diagnosis not present

## 2020-08-20 DIAGNOSIS — Z79899 Other long term (current) drug therapy: Secondary | ICD-10-CM | POA: Diagnosis not present

## 2020-08-20 DIAGNOSIS — I959 Hypotension, unspecified: Secondary | ICD-10-CM | POA: Diagnosis not present

## 2020-08-20 DIAGNOSIS — Z800 Family history of malignant neoplasm of digestive organs: Secondary | ICD-10-CM | POA: Diagnosis not present

## 2020-08-20 DIAGNOSIS — Z5111 Encounter for antineoplastic chemotherapy: Secondary | ICD-10-CM | POA: Diagnosis not present

## 2020-08-20 DIAGNOSIS — Z833 Family history of diabetes mellitus: Secondary | ICD-10-CM | POA: Diagnosis not present

## 2020-08-20 DIAGNOSIS — R531 Weakness: Secondary | ICD-10-CM | POA: Diagnosis not present

## 2020-08-20 DIAGNOSIS — R109 Unspecified abdominal pain: Secondary | ICD-10-CM | POA: Diagnosis not present

## 2020-08-20 DIAGNOSIS — Z5189 Encounter for other specified aftercare: Secondary | ICD-10-CM | POA: Diagnosis not present

## 2020-08-20 DIAGNOSIS — Z8249 Family history of ischemic heart disease and other diseases of the circulatory system: Secondary | ICD-10-CM | POA: Diagnosis not present

## 2020-08-20 DIAGNOSIS — Z823 Family history of stroke: Secondary | ICD-10-CM | POA: Diagnosis not present

## 2020-08-20 DIAGNOSIS — Z809 Family history of malignant neoplasm, unspecified: Secondary | ICD-10-CM | POA: Diagnosis not present

## 2020-08-20 LAB — CBC WITH DIFFERENTIAL/PLATELET
Abs Immature Granulocytes: 0.12 10*3/uL — ABNORMAL HIGH (ref 0.00–0.07)
Basophils Absolute: 0 10*3/uL (ref 0.0–0.1)
Basophils Relative: 1 %
Eosinophils Absolute: 0 10*3/uL (ref 0.0–0.5)
Eosinophils Relative: 0 %
HCT: 27.3 % — ABNORMAL LOW (ref 36.0–46.0)
Hemoglobin: 9.1 g/dL — ABNORMAL LOW (ref 12.0–15.0)
Immature Granulocytes: 2 %
Lymphocytes Relative: 28 %
Lymphs Abs: 1.6 10*3/uL (ref 0.7–4.0)
MCH: 31.6 pg (ref 26.0–34.0)
MCHC: 33.3 g/dL (ref 30.0–36.0)
MCV: 94.8 fL (ref 80.0–100.0)
Monocytes Absolute: 0.8 10*3/uL (ref 0.1–1.0)
Monocytes Relative: 14 %
Neutro Abs: 3.2 10*3/uL (ref 1.7–7.7)
Neutrophils Relative %: 55 %
Platelets: 154 10*3/uL (ref 150–400)
RBC: 2.88 MIL/uL — ABNORMAL LOW (ref 3.87–5.11)
RDW: 19.1 % — ABNORMAL HIGH (ref 11.5–15.5)
WBC: 5.7 10*3/uL (ref 4.0–10.5)
nRBC: 0 % (ref 0.0–0.2)

## 2020-08-20 LAB — COMPREHENSIVE METABOLIC PANEL
ALT: 12 U/L (ref 0–44)
AST: 31 U/L (ref 15–41)
Albumin: 2.9 g/dL — ABNORMAL LOW (ref 3.5–5.0)
Alkaline Phosphatase: 56 U/L (ref 38–126)
Anion gap: 12 (ref 5–15)
BUN: 16 mg/dL (ref 6–20)
CO2: 22 mmol/L (ref 22–32)
Calcium: 8.1 mg/dL — ABNORMAL LOW (ref 8.9–10.3)
Chloride: 101 mmol/L (ref 98–111)
Creatinine, Ser: 1.14 mg/dL — ABNORMAL HIGH (ref 0.44–1.00)
GFR, Estimated: 58 mL/min — ABNORMAL LOW (ref >60)
Glucose, Bld: 98 mg/dL (ref 70–99)
Potassium: 3.7 mmol/L (ref 3.5–5.1)
Sodium: 135 mmol/L (ref 135–145)
Total Bilirubin: 0.2 mg/dL — ABNORMAL LOW (ref 0.3–1.2)
Total Protein: 7 g/dL (ref 6.5–8.1)

## 2020-08-20 MED ORDER — DEXTROSE 5 % IV SOLN
Freq: Once | INTRAVENOUS | Status: AC
  Filled 2020-08-20: qty 250

## 2020-08-20 MED ORDER — OXALIPLATIN CHEMO INJECTION 100 MG/20ML
85.0000 mg/m2 | Freq: Once | INTRAVENOUS | Status: AC
  Administered 2020-08-20: 135 mg via INTRAVENOUS
  Filled 2020-08-20: qty 27

## 2020-08-20 MED ORDER — FLUOROURACIL CHEMO INJECTION 5 GM/100ML
2400.0000 mg/m2 | INTRAVENOUS | Status: DC
  Administered 2020-08-20: 3750 mg via INTRAVENOUS
  Filled 2020-08-20: qty 75

## 2020-08-20 MED ORDER — SODIUM CHLORIDE 0.9 % IV SOLN
10.0000 mg | Freq: Once | INTRAVENOUS | Status: AC
  Administered 2020-08-20: 10 mg via INTRAVENOUS
  Filled 2020-08-20: qty 10

## 2020-08-20 MED ORDER — LEUCOVORIN CALCIUM INJECTION 350 MG
650.0000 mg | Freq: Once | INTRAVENOUS | Status: AC
  Administered 2020-08-20: 650 mg via INTRAVENOUS
  Filled 2020-08-20: qty 25

## 2020-08-20 MED ORDER — PALONOSETRON HCL INJECTION 0.25 MG/5ML
0.2500 mg | Freq: Once | INTRAVENOUS | Status: AC
  Administered 2020-08-20: 0.25 mg via INTRAVENOUS
  Filled 2020-08-20: qty 5

## 2020-08-20 MED ORDER — FLUOROURACIL CHEMO INJECTION 2.5 GM/50ML
400.0000 mg/m2 | Freq: Once | INTRAVENOUS | Status: AC
  Administered 2020-08-20: 650 mg via INTRAVENOUS
  Filled 2020-08-20: qty 13

## 2020-08-20 NOTE — Progress Notes (Signed)
Patient states she has no appetite due to her lack of taste. Patient states her taste is completely gone but she is staying hydrated. Patient states she drinks a lot of fluids. Patient states she is very fatigue.

## 2020-08-20 NOTE — Progress Notes (Signed)
B/P 85/59, Per Dr. Grayland Ormond okay to proceed with treatment, pt encouraged to drink plenty of water, pt verbalizes understanding.   1310: pt tolerated infusion well. No s/s of distress or reaction noted. Pt stable at discharge.

## 2020-08-22 ENCOUNTER — Inpatient Hospital Stay: Payer: Medicaid Other

## 2020-08-22 ENCOUNTER — Other Ambulatory Visit: Payer: Self-pay

## 2020-08-22 VITALS — BP 76/48 | HR 70 | Temp 100.5°F | Resp 22

## 2020-08-22 DIAGNOSIS — I959 Hypotension, unspecified: Secondary | ICD-10-CM

## 2020-08-22 DIAGNOSIS — Z800 Family history of malignant neoplasm of digestive organs: Secondary | ICD-10-CM | POA: Diagnosis not present

## 2020-08-22 DIAGNOSIS — R5383 Other fatigue: Secondary | ICD-10-CM | POA: Diagnosis not present

## 2020-08-22 DIAGNOSIS — C19 Malignant neoplasm of rectosigmoid junction: Secondary | ICD-10-CM | POA: Diagnosis not present

## 2020-08-22 DIAGNOSIS — R531 Weakness: Secondary | ICD-10-CM | POA: Diagnosis not present

## 2020-08-22 DIAGNOSIS — Z79899 Other long term (current) drug therapy: Secondary | ICD-10-CM | POA: Diagnosis not present

## 2020-08-22 DIAGNOSIS — D709 Neutropenia, unspecified: Secondary | ICD-10-CM | POA: Diagnosis not present

## 2020-08-22 DIAGNOSIS — Z809 Family history of malignant neoplasm, unspecified: Secondary | ICD-10-CM | POA: Diagnosis not present

## 2020-08-22 DIAGNOSIS — D649 Anemia, unspecified: Secondary | ICD-10-CM | POA: Diagnosis not present

## 2020-08-22 DIAGNOSIS — Z5111 Encounter for antineoplastic chemotherapy: Secondary | ICD-10-CM | POA: Diagnosis not present

## 2020-08-22 DIAGNOSIS — Z833 Family history of diabetes mellitus: Secondary | ICD-10-CM | POA: Diagnosis not present

## 2020-08-22 DIAGNOSIS — R109 Unspecified abdominal pain: Secondary | ICD-10-CM | POA: Diagnosis not present

## 2020-08-22 DIAGNOSIS — Z8249 Family history of ischemic heart disease and other diseases of the circulatory system: Secondary | ICD-10-CM | POA: Diagnosis not present

## 2020-08-22 DIAGNOSIS — R112 Nausea with vomiting, unspecified: Secondary | ICD-10-CM

## 2020-08-22 DIAGNOSIS — Z8049 Family history of malignant neoplasm of other genital organs: Secondary | ICD-10-CM | POA: Diagnosis not present

## 2020-08-22 DIAGNOSIS — Z823 Family history of stroke: Secondary | ICD-10-CM | POA: Diagnosis not present

## 2020-08-22 DIAGNOSIS — N289 Disorder of kidney and ureter, unspecified: Secondary | ICD-10-CM | POA: Diagnosis not present

## 2020-08-22 DIAGNOSIS — Z5189 Encounter for other specified aftercare: Secondary | ICD-10-CM | POA: Diagnosis not present

## 2020-08-22 DIAGNOSIS — R509 Fever, unspecified: Secondary | ICD-10-CM

## 2020-08-22 MED ORDER — ONDANSETRON HCL 4 MG/2ML IJ SOLN
8.0000 mg | Freq: Once | INTRAMUSCULAR | Status: AC
  Administered 2020-08-22: 8 mg via INTRAVENOUS
  Filled 2020-08-22: qty 4

## 2020-08-22 MED ORDER — SODIUM CHLORIDE 0.9% FLUSH
10.0000 mL | INTRAVENOUS | Status: DC | PRN
  Filled 2020-08-22: qty 10

## 2020-08-22 MED ORDER — HEPARIN SOD (PORK) LOCK FLUSH 100 UNIT/ML IV SOLN
500.0000 [IU] | Freq: Once | INTRAVENOUS | Status: AC | PRN
  Administered 2020-08-22: 500 [IU]
  Filled 2020-08-22: qty 5

## 2020-08-22 MED ORDER — PEGFILGRASTIM-CBQV 6 MG/0.6ML ~~LOC~~ SOSY
6.0000 mg | PREFILLED_SYRINGE | Freq: Once | SUBCUTANEOUS | Status: AC
  Administered 2020-08-22: 6 mg via SUBCUTANEOUS
  Filled 2020-08-22: qty 0.6

## 2020-08-22 MED ORDER — SODIUM CHLORIDE 0.9 % IV SOLN
Freq: Once | INTRAVENOUS | Status: AC
  Filled 2020-08-22: qty 250

## 2020-08-22 NOTE — Progress Notes (Signed)
Patient came into clinic today to have her pump disconnected and to receive Udenyca injection post chemo. Patient stated that she felt fatigued, and had some nausea, but offered no other complaints. Upon assessment, BP  63/48, temp 100.5, resp 20,pulse 77, O2 100% on room air.. Patient stated that the low BP was normal for her and that she felt fine. Patient stated that she has been drinking plenty of fluids and denied dizziness. While disconnecting pump, patient started coughing, and when asked, she stated that she had noticed a cough and a "head cold" that started 2 days ago, which was the day she was here for her chemotherapy treatment and pump connection. She stated that she told Dr. Grayland Ormond about it when she was here, prior to her treatment, but that he told her that he was not worried about it.  Patient was put into an isolation room in the infusion area, and secure chat message to Faythe Casa, NP in the symptom management clinic.  Faythe Casa, NP assessed patient in isolation room and ordered 1 Liter of NS to be given over 1 hour and 8 mg Zofran to be given IV. She also instructed patient to go and get a home test or get tested for Covid 19 today.  BP after 1 Liter of NS was 78/48. Udenyca injection given in clinic. Port was de accessed. Patient was discharged to home with the instructions to go get Covid tested today, stay well hydrated, and to quarantine until she receives results.  Patient verbalized understanding of all instructions given.

## 2020-08-29 ENCOUNTER — Telehealth: Payer: Self-pay | Admitting: Nurse Practitioner

## 2020-08-29 DIAGNOSIS — R509 Fever, unspecified: Secondary | ICD-10-CM

## 2020-08-29 NOTE — Telephone Encounter (Signed)
Complains of fever onset 1 week ago- 102.9 at highest to 100.5 Essentially unchanged with tylenol. Coughing up clear mucous. Doesn't feel good. Syncopal episode yesterday without injury. Advised patient to seek evaluation in ER or Urgent care. Suspect she is orthostatic due to fever. Patient declines. Concerned of bad weather and wait times. Recommended covid test. Again she declines citing concerns of weather. She will consider testing at ancillary location over weekend should weather allow. If negative, could consider evaluation at [facility], possible IV fluids.

## 2020-08-29 NOTE — Progress Notes (Deleted)
[place]  [phone] [phone]) [phone]  ID: Anita Sullivan OB: [birth year]  MR#: [id number]  OVF#:[id number]  Patient Care Team: Valerie Roys, DO as PCP - General (Family Medicine) [name], MD as Consulting Physician (Oncology) [name], RN as Oncology Nurse Navigator  CHIEF COMPLAINT: Metastatic colorectal adenocarcinoma, KRAS wild-type  INTERVAL HISTORY: Patient returns to clinic today for further evaluation and consideration of cycle 6 of FOLFOX.  She continues to have chronic weakness and fatigue, but otherwise feels well.  Her nephrostomy tubes were internalized recently and she no longer is complaining of pain.  She no longer has rectal bleeding.  She has no neurologic complaints.  She has a fair appetite, but denies weight loss.  She denies any chest pain, shortness of breath, cough, or hemoptysis.  She denies any nausea, vomiting, constipation, or diarrhea.  She has no urinary complaints.  Patient offers no further specific complaints today.  REVIEW OF SYSTEMS:   Review of Systems  Constitutional: Positive for malaise/fatigue. Negative for fever and weight loss.  HENT: Negative.  Negative for congestion.   Respiratory: Negative.  Negative for cough, hemoptysis and shortness of breath.   Cardiovascular: Negative.  Negative for chest pain and leg swelling.  Gastrointestinal: Negative for blood in stool, constipation, nausea and vomiting.  Genitourinary: Negative.  Negative for flank pain and hematuria.  Musculoskeletal: Negative.  Negative for back pain.  Skin: Negative.  Negative for rash.  Neurological: Positive for weakness. Negative for dizziness, seizures and headaches.  Psychiatric/Behavioral: Negative.  The patient is not nervous/anxious.     As per HPI. Otherwise, a complete review of systems is negative.  PAST MEDICAL HISTORY: Past Medical History:  Diagnosis Date  . Colon cancer ([place])   . Family history  of pancreatic cancer   . Family history of uterine cancer     PAST SURGICAL HISTORY: Past Surgical History:  Procedure Laterality Date  . COLONOSCOPY WITH PROPOFOL N/A 05/27/2020   Procedure: COLONOSCOPY WITH PROPOFOL;  Surgeon: [name], MD;  Location: [facility] ENDOSCOPY;  Service: Gastroenterology;  Laterality: N/A;  . FLEXIBLE SIGMOIDOSCOPY N/A 05/28/2020   Procedure: FLEXIBLE SIGMOIDOSCOPY;  Surgeon: [name], MD;  Location: [facility] ENDOSCOPY;  Service: Gastroenterology;  Laterality: N/A;  . IR NEPHROSTOMY EXCHANGE LEFT  07/11/2020  . IR NEPHROSTOMY EXCHANGE RIGHT  07/11/2020  . IR NEPHROSTOMY PLACEMENT LEFT  05/23/2020  . IR NEPHROSTOMY PLACEMENT RIGHT  05/23/2020  . IR URETERAL STENT LEFT NEW ACCESS W/O SEP NEPHROSTOMY CATH  08/15/2020  . IR URETERAL STENT RIGHT NEW ACCESS W/O SEP NEPHROSTOMY CATH  08/15/2020  . PORTACATH PLACEMENT Left 05/29/2020   Procedure: INSERTION PORT-A-CATH;  Surgeon: [name], MD;  Location: [facility] ORS;  Service: General;  Laterality: Left;    FAMILY HISTORY: Family History  Problem Relation Age of Onset  . Other Mother        Corticobasal degeneration  . Uterine cancer Mother   . Diabetes Maternal Grandmother   . Stroke Maternal Grandfather   . Stroke Paternal Grandfather   . Heart disease Father   . Pancreatic cancer Maternal Uncle 15  . Cancer Paternal Aunt        unk type  . Liver cancer Maternal Uncle   . Pancreatic cancer Cousin   . Cancer Cousin        unk type    ADVANCED DIRECTIVES (Y/N):  N  HEALTH MAINTENANCE: Social History   Tobacco Use  . Smoking status: Never Smoker  .  Smokeless tobacco: Never Used  Vaping Use  . Vaping Use: Never used  Substance Use Topics  . Alcohol use: Never  . Drug use: Never     Colonoscopy:  PAP:  Bone density:  Lipid panel:  No Known Allergies  Current Outpatient Medications  Medication Sig Dispense Refill  . HYDROcodone-acetaminophen (NORCO) 5-325 MG tablet Take 1  tablet by mouth every 6 (six) hours as needed for moderate pain. (Patient not taking: No sig reported) 30 tablet 0  . ondansetron (ZOFRAN) 4 MG tablet Take 4 mg by mouth every 8 (eight) hours as needed for nausea or vomiting. (Patient not taking: No sig reported)    . pantoprazole (PROTONIX) 40 MG tablet Take 1 tablet (40 mg total) by mouth 2 (two) times daily. 60 tablet 0   No current facility-administered medications for this visit.    OBJECTIVE: There were no vitals filed for this visit.   There is no height or weight on file to calculate BMI.    ECOG FS:1 - Symptomatic but completely ambulatory  General: Well-developed, well-nourished, no acute distress. Eyes: Pink conjunctiva, anicteric sclera. HEENT: Normocephalic, moist mucous membranes. Lungs: No audible wheezing or coughing. Heart: Regular rate and rhythm. Abdomen: Soft, nontender, no obvious distention. Musculoskeletal: No edema, cyanosis, or clubbing. Neuro: Alert, answering all questions appropriately. Cranial nerves grossly intact. Skin: No rashes or petechiae noted. Psych: Normal affect.  LAB RESULTS:  Lab Results  Component Value Date   NA 135 08/20/2020   K 3.7 08/20/2020   CL 101 08/20/2020   CO2 22 08/20/2020   GLUCOSE 98 08/20/2020   BUN 16 08/20/2020   CREATININE 1.14 (H) 08/20/2020   CALCIUM 8.1 (L) 08/20/2020   PROT 7.0 08/20/2020   ALBUMIN 2.9 (L) 08/20/2020   AST 31 08/20/2020   ALT 12 08/20/2020   ALKPHOS 56 08/20/2020   BILITOT 0.2 (L) 08/20/2020   GFRNONAA 58 (L) 08/20/2020    Lab Results  Component Value Date   WBC 5.7 08/20/2020   NEUTROABS 3.2 08/20/2020   HGB 9.1 (L) 08/20/2020   HCT 27.3 (L) 08/20/2020   MCV 94.8 08/20/2020   PLT 154 08/20/2020     STUDIES: IR URETERAL STENT LEFT NEW ACCESS W/O SEP NEPHROSTOMY CATH  Result Date: 08/15/2020 INDICATION: Metastatic colorectal cancer, bilateral external for ostomy EXAM: Removal of the nephrostomies Bilateral antegrade nephrostogram  Successful placement of bilateral 8 x 26 internal ureteral stents. COMPARISON:  07/11/2020 MEDICATIONS: 1% lidocaine local ANESTHESIA/SEDATION: Fentanyl 100 mcg IV; Versed 2.0 mg IV Moderate Sedation Time:  30 minutes The patient was continuously monitored during the procedure by the interventional radiology nurse under my direct supervision. CONTRAST:  20 cc-administered into the collecting system(s) FLUOROSCOPY TIME:  Fluoroscopy Time: 7 minutes 0 seconds (29 mGy). COMPLICATIONS: None immediate. PROCEDURE: Informed written consent was obtained from the patient after a thorough discussion of the procedural risks, benefits and alternatives. All questions were addressed. Maximal Sterile Barrier Technique was utilized including caps, mask, sterile gowns, sterile gloves, sterile drape, hand hygiene and skin antiseptic. A timeout was performed prior to the initiation of the procedure. LEFT NEPHROSTOMY CONVERSION TO URETERAL STENT: Initial nephrostogram performed through the existing left nephrostomy. This demonstrates patency of the left ureter. There is mild left hydroureteronephrosis extending to the pelvic level. Below this, there is ureteral narrowing but the pelvic portion of the ureter is patent to the bladder. Catheter was cut and removed over a Bentson guidewire. Kumpe catheter utilized to advance the access through the distal  left ureteral stricture into the bladder. Contrast injection confirms position in the bladder. Measurements obtained for the appropriate stent length. Amplatz guidewire inserted. Seven French sheath advanced followed by placement of a Bentson safety guidewire alongside the Amplatz guidewire. Over the Amplatz guidewire, an 8 French x 26 cm ureteral stent was advanced with the distal loop formed in the bladder and the proximal loop in the renal pelvis. Position confirmed with fluoroscopy. Contrast injection confirms antegrade flow through the stent. External access and safety guidewire  removed. Images obtained for documentation. RIGHT NEPHROSTOMY CONVERSION TO URETERAL STENT: In a similar fashion, the right nephrostomy catheter was injected for antegrade nephrostogram. There is similar hydronephrosis and hydroureter to the pelvic level. Pelvic portion of the ureter demonstrates diffuse narrowing without occlusion. Contrast does drain into the bladder. Catheter was cut and removed over a Bentson guidewire. Kumpe catheter and Bentson guidewire advanced into the bladder. Seven French sheath advanced. Amplatz guidewire also advanced into the bladder. Over the Amplatz guidewire, a 8 French 26 cm ureteral stent was advanced with the distal loop formed in the bladder and the proximal loop in the renal pelvis. Position confirmed with contrast injection. There is antegrade flow through the stent. Safety guidewire and external access removed. Final imaging obtained. IMPRESSION: Successful conversion of the bilateral nephrostomies to internal ureteral stents as above. External nephrostomy access removed completely. Electronically Signed   By: Jerilynn Mages.  Shick M.D.   On: 08/15/2020 11:41   IR URETERAL STENT RIGHT NEW ACCESS W/O SEP NEPHROSTOMY CATH  Result Date: 08/15/2020 INDICATION: Metastatic colorectal cancer, bilateral external for ostomy EXAM: Removal of the nephrostomies Bilateral antegrade nephrostogram Successful placement of bilateral 8 x 26 internal ureteral stents. COMPARISON:  07/11/2020 MEDICATIONS: 1% lidocaine local ANESTHESIA/SEDATION: Fentanyl 100 mcg IV; Versed 2.0 mg IV Moderate Sedation Time:  30 minutes The patient was continuously monitored during the procedure by the interventional radiology nurse under my direct supervision. CONTRAST:  20 cc-administered into the collecting system(s) FLUOROSCOPY TIME:  Fluoroscopy Time: 7 minutes 0 seconds (29 mGy). COMPLICATIONS: None immediate. PROCEDURE: Informed written consent was obtained from the patient after a thorough discussion of the  procedural risks, benefits and alternatives. All questions were addressed. Maximal Sterile Barrier Technique was utilized including caps, mask, sterile gowns, sterile gloves, sterile drape, hand hygiene and skin antiseptic. A timeout was performed prior to the initiation of the procedure. LEFT NEPHROSTOMY CONVERSION TO URETERAL STENT: Initial nephrostogram performed through the existing left nephrostomy. This demonstrates patency of the left ureter. There is mild left hydroureteronephrosis extending to the pelvic level. Below this, there is ureteral narrowing but the pelvic portion of the ureter is patent to the bladder. Catheter was cut and removed over a Bentson guidewire. Kumpe catheter utilized to advance the access through the distal left ureteral stricture into the bladder. Contrast injection confirms position in the bladder. Measurements obtained for the appropriate stent length. Amplatz guidewire inserted. Seven French sheath advanced followed by placement of a Bentson safety guidewire alongside the Amplatz guidewire. Over the Amplatz guidewire, an 8 French x 26 cm ureteral stent was advanced with the distal loop formed in the bladder and the proximal loop in the renal pelvis. Position confirmed with fluoroscopy. Contrast injection confirms antegrade flow through the stent. External access and safety guidewire removed. Images obtained for documentation. RIGHT NEPHROSTOMY CONVERSION TO URETERAL STENT: In a similar fashion, the right nephrostomy catheter was injected for antegrade nephrostogram. There is similar hydronephrosis and hydroureter to the pelvic level. Pelvic portion of the ureter  demonstrates diffuse narrowing without occlusion. Contrast does drain into the bladder. Catheter was cut and removed over a Bentson guidewire. Kumpe catheter and Bentson guidewire advanced into the bladder. Seven French sheath advanced. Amplatz guidewire also advanced into the bladder. Over the Amplatz guidewire, a 8  French 26 cm ureteral stent was advanced with the distal loop formed in the bladder and the proximal loop in the renal pelvis. Position confirmed with contrast injection. There is antegrade flow through the stent. Safety guidewire and external access removed. Final imaging obtained. IMPRESSION: Successful conversion of the bilateral nephrostomies to internal ureteral stents as above. External nephrostomy access removed completely. Electronically Signed   By: Jerilynn Mages.  Shick M.D.   On: 08/15/2020 11:41    ASSESSMENT: Metastatic colorectal adenocarcinoma,  KRAS wild-type.  PLAN:   1. Metastatic colorectal adenocarcinoma: Although unclear whether malignancy originated in colon or rectum, given the pattern of spread to liver, will treat as a stage IV colon cancer.  CEA is only mildly elevated at 9.9.  Patient has had port placement.  Initial plan was to treat with FOLFOX plus Avastin every 2 weeks.  Can consider Panitumumab as second line.  Although rectal bleeding has stopped, will continue to hold Avastin and added to regimen with cycle 7.  Proceed with cycle 6 of FOLFOX today.  Return to clinic in 2 days for pump removal and Udenyca.  Patient will then return to clinic in 2 weeks for further evaluation and consideration of cycle 7 which will be FOLFOX plus Avastin.  Will reimage with CT scan prior to next treatment.  Appreciate palliative care and genetics input.  2.  Anemia: Chronic and unchanged.  Patient's hemoglobin is stable at 9.1.  Add Avastin with cycle 7 as above. 3.  Renal insufficiency: Chronic and unchanged.  Patient's creatinine is 1.14 today. 4.  Neutropenia: Resolved.  Udenyca as needed for remainder of cycles if needed. 5.  Hypokalemia: Resolved. 6.  Congestion/sinus drainage: Resolved. 7.  Hypotension: Encourage fluid intake.  Proceed with treatment as above. 8.  Nephrostomy tubes: Internalized on August 15, 2020.  Patient expressed understanding and was in agreement with this plan. She  also understands that She can call clinic at any time with any questions, concerns, or complaints.   Cancer Staging Metastatic colorectal cancer [facility]) Staging form: Colon and Rectum, AJCC 8th Edition - Clinical stage from 05/31/2020: Stage IVC (cTX, cNX, pM1c) - Signed by [name], MD on 05/31/2020   [name], MD   08/29/2020 6:51 AM

## 2020-09-01 ENCOUNTER — Telehealth: Payer: Self-pay | Admitting: *Deleted

## 2020-09-01 NOTE — Telephone Encounter (Signed)
Patient called in requesting to be set up for covid testing at her appointment on 2/2. Left vm for patient in regards to needing to r/s her appointments on 2/2 until we have results. Patient also informed that we cannot do covid testing at the clinic, left message to return call to get information on how she can schedule covid testing at a [place] testing site.

## 2020-09-02 ENCOUNTER — Telehealth: Payer: Self-pay | Admitting: *Deleted

## 2020-09-02 ENCOUNTER — Ambulatory Visit: Payer: Medicaid Other

## 2020-09-02 NOTE — Telephone Encounter (Signed)
Call placed to patient to follow up on status of covid testing, results. Covid test recommended on Friday based on telephone visit with Beckey Rutter. Patient reports that her symptoms have improved and that she does not need covid testing at this time. Patient informed that based on symptoms of cough and fever she had Friday she would need to proceed with testing prior to coming into clinic for appts per Dr. Grayland Ormond. Patient is currently out of town in [place], states she will get tested there. Appts for 2/2 have been cancelled, patient to call with test results and will r/s based on covid test results.

## 2020-09-03 ENCOUNTER — Inpatient Hospital Stay: Payer: Medicaid Other

## 2020-09-03 ENCOUNTER — Inpatient Hospital Stay: Payer: Medicaid Other | Admitting: Oncology

## 2020-09-03 DIAGNOSIS — C19 Malignant neoplasm of rectosigmoid junction: Secondary | ICD-10-CM

## 2020-09-04 ENCOUNTER — Inpatient Hospital Stay: Payer: Medicaid Other | Attending: Oncology

## 2020-09-04 ENCOUNTER — Telehealth: Payer: Self-pay | Admitting: Nurse Practitioner

## 2020-09-04 DIAGNOSIS — Z8049 Family history of malignant neoplasm of other genital organs: Secondary | ICD-10-CM | POA: Insufficient documentation

## 2020-09-04 DIAGNOSIS — R531 Weakness: Secondary | ICD-10-CM | POA: Insufficient documentation

## 2020-09-04 DIAGNOSIS — D649 Anemia, unspecified: Secondary | ICD-10-CM | POA: Insufficient documentation

## 2020-09-04 DIAGNOSIS — Z5189 Encounter for other specified aftercare: Secondary | ICD-10-CM | POA: Insufficient documentation

## 2020-09-04 DIAGNOSIS — Z5111 Encounter for antineoplastic chemotherapy: Secondary | ICD-10-CM | POA: Insufficient documentation

## 2020-09-04 DIAGNOSIS — C19 Malignant neoplasm of rectosigmoid junction: Secondary | ICD-10-CM | POA: Insufficient documentation

## 2020-09-04 DIAGNOSIS — Z8249 Family history of ischemic heart disease and other diseases of the circulatory system: Secondary | ICD-10-CM | POA: Insufficient documentation

## 2020-09-04 DIAGNOSIS — Z800 Family history of malignant neoplasm of digestive organs: Secondary | ICD-10-CM | POA: Insufficient documentation

## 2020-09-04 DIAGNOSIS — Z833 Family history of diabetes mellitus: Secondary | ICD-10-CM | POA: Insufficient documentation

## 2020-09-04 DIAGNOSIS — U071 COVID-19: Secondary | ICD-10-CM

## 2020-09-04 DIAGNOSIS — R5383 Other fatigue: Secondary | ICD-10-CM | POA: Insufficient documentation

## 2020-09-04 DIAGNOSIS — Z809 Family history of malignant neoplasm, unspecified: Secondary | ICD-10-CM | POA: Insufficient documentation

## 2020-09-04 DIAGNOSIS — Z823 Family history of stroke: Secondary | ICD-10-CM | POA: Insufficient documentation

## 2020-09-04 DIAGNOSIS — I959 Hypotension, unspecified: Secondary | ICD-10-CM | POA: Insufficient documentation

## 2020-09-04 DIAGNOSIS — Z79899 Other long term (current) drug therapy: Secondary | ICD-10-CM | POA: Insufficient documentation

## 2020-09-04 DIAGNOSIS — Z5112 Encounter for antineoplastic immunotherapy: Secondary | ICD-10-CM | POA: Insufficient documentation

## 2020-09-04 NOTE — Telephone Encounter (Signed)
Spoke to patient based on positive covid test result. She reports sinusitis symptoms developed approximately 2 weeks ago. Now 'feels like she was hit by a car'. Based on this, don't believe she qualifies for antiviral or antibody treatment. Based on immunocompromise, recommend 21 day quarantine. Will review with Dr. Grayland Ormond.    Reviewed home care supportive care including flonase for sinus congestion. Afrin ok but no longer than 5 days. Stay hydrated. Not having cough. Rest. Ambulate as tolerated. Mucinex to thin secretions. Tylenol for body aches. Reviewed symptoms that would warrant ER or hospital care. Encouraged patient to monitor oxygen levels with pulse oximeter, reviewed troubleshooting, and to seek ER for SpO2 < 94%.  - CDC criteria for quarantine and isolation reviewed - In non-hospitalized patients, we do not treat COVID-19 with dexamethasone, prednisone, or other corticosteroids. Per the literature, extrapolating from the results of studies of hospitalized patients, there is no evidence that corticosteroids benefit patients without a supplemental oxygen requirement, and further, they may be associated with poorer clinical outcomes - There is not high-quality data that suggests that supplementation with vitamin C, vitamin D, or zinc reduces the severity of COVID-19 in non-hospitalized patients - For patients with documented COVID-19, we do not routinely administer empiric therapy for bacterial pneumonia. Data are limited, but bacterial superinfection does not appear to be a prominent feature of COVID-19

## 2020-09-04 NOTE — Progress Notes (Signed)
Nutrition Follow-up:  Patient with metastatic colorectal adenocarcinoma.    Patient reports that she tested positive for COVID (results received today). She states that she took test at [facility] in [place] yesterday.  She is currently in [place].  Reports that appetite is poor still having issues with dizziness.  Trying to push fluids.  Reports that she is trying to eat q 2 hours.  Foods taste like paste.  Reports that she was able to eat fettuccine noodles with a garlic sauce and butter and she was able to taste it and enjoyed it. Has been trying to eat ice cream which she never does.  Did try the boost soothe and was able to drink them, only half one day and the other half the next day.  Continues to try and experiment with different flavors.      Medications: reviewed  Labs: reviewed  Anthropometrics:   Weight 127 lb on 1/19 decreased from 132 lb on 12/21   NUTRITION DIAGNOSIS: Inadequate oral intake continues   INTERVENTION:  Will notify Dr Grayland Ormond, Beckey Rutter, NP and Tillie Rung, [place]. Encouraged patient to continue eating q 2 hours Encouraged patient to push fluids.   Discussed ways to add calories and protein    MONITORING, EVALUATION, GOAL: weight trends, intake   NEXT VISIT: next 4-6 weeks  Larsen Zettel B. Zenia Resides, [place], [place] Registered Dietitian [phone] (mobile)

## 2020-09-05 ENCOUNTER — Inpatient Hospital Stay: Payer: Medicaid Other

## 2020-09-12 ENCOUNTER — Telehealth: Payer: Self-pay | Admitting: *Deleted

## 2020-09-12 NOTE — Telephone Encounter (Signed)
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

## 2020-09-15 ENCOUNTER — Encounter: Payer: Self-pay | Admitting: *Deleted

## 2020-09-15 NOTE — Telephone Encounter (Signed)
I will take care of completing letter for patient and let her know when it is ready. Thanks.

## 2020-09-19 ENCOUNTER — Other Ambulatory Visit: Payer: Self-pay

## 2020-09-19 ENCOUNTER — Ambulatory Visit
Admission: RE | Admit: 2020-09-19 | Discharge: 2020-09-19 | Disposition: A | Discharge status: Home / Self Care | Payer: Medicaid Other | Source: Ambulatory Visit | Attending: Oncology | Admitting: Oncology

## 2020-09-19 ENCOUNTER — Ambulatory Visit
Admission: RE | Admit: 2020-09-19 | Discharge: 2020-09-19 | Disposition: A | Discharge status: Home / Self Care | Payer: Medicaid Other | Source: Home / Self Care | Attending: Oncology | Admitting: Oncology

## 2020-09-19 DIAGNOSIS — J181 Lobar pneumonia, unspecified organism: Secondary | ICD-10-CM | POA: Diagnosis not present

## 2020-09-19 DIAGNOSIS — C189 Malignant neoplasm of colon, unspecified: Secondary | ICD-10-CM | POA: Diagnosis not present

## 2020-09-19 DIAGNOSIS — C19 Malignant neoplasm of rectosigmoid junction: Secondary | ICD-10-CM | POA: Diagnosis not present

## 2020-09-19 DIAGNOSIS — J90 Pleural effusion, not elsewhere classified: Secondary | ICD-10-CM | POA: Diagnosis not present

## 2020-09-19 DIAGNOSIS — D259 Leiomyoma of uterus, unspecified: Secondary | ICD-10-CM | POA: Diagnosis not present

## 2020-09-19 DIAGNOSIS — K769 Liver disease, unspecified: Secondary | ICD-10-CM | POA: Diagnosis not present

## 2020-09-19 DIAGNOSIS — N1330 Unspecified hydronephrosis: Secondary | ICD-10-CM | POA: Diagnosis not present

## 2020-09-19 MED ORDER — IOHEXOL 300 MG/ML  SOLN
100.0000 mL | Freq: Once | INTRAMUSCULAR | Status: AC | PRN
  Administered 2020-09-19: 100 mL via INTRAVENOUS

## 2020-09-21 NOTE — Progress Notes (Signed)
[place]  [phone] [phone]  ID: Anita Sullivan OB: 10-Mar-1966  MR#: [id number]  WVP#:[id number]  Patient Care Team: Valerie Roys, DO as PCP - General (Family Medicine) [name], MD as Consulting Physician (Oncology) [name], RN as Oncology Nurse Navigator  CHIEF COMPLAINT: Metastatic colorectal adenocarcinoma, KRAS wild-type  INTERVAL HISTORY: Patient returns to clinic today for further evaluation, discussion of her imaging results, and consideration of cycle 7 of FOLFOX plus Avastin.  Treatment was delayed several weeks secondary to Covid infection, but patient is now fully recovered and back to her baseline.  She continues to have chronic weakness and fatigue.  She no longer has rectal bleeding.  She has no neurologic complaints.  She has a fair appetite, but denies weight loss.  She denies any chest pain, shortness of breath, cough, or hemoptysis.  She denies any nausea, vomiting, constipation, or diarrhea.  She has no urinary complaints.  Patient offers no further specific complaints today.  REVIEW OF SYSTEMS:   Review of Systems  Constitutional: Positive for malaise/fatigue. Negative for fever and weight loss.  HENT: Negative.  Negative for congestion.   Respiratory: Negative.  Negative for cough, hemoptysis and shortness of breath.   Cardiovascular: Negative.  Negative for chest pain and leg swelling.  Gastrointestinal: Negative for blood in stool, constipation, nausea and vomiting.  Genitourinary: Negative.  Negative for flank pain and hematuria.  Musculoskeletal: Negative.  Negative for back pain.  Skin: Negative.  Negative for rash.  Neurological: Positive for weakness. Negative for dizziness, seizures and headaches.  Psychiatric/Behavioral: Negative.  The patient is not nervous/anxious.     As per HPI. Otherwise, a complete review of systems is negative.  PAST MEDICAL HISTORY: Past Medical History:   Diagnosis Date  . Colon cancer ([place])   . Family history of pancreatic cancer   . Family history of uterine cancer     PAST SURGICAL HISTORY: Past Surgical History:  Procedure Laterality Date  . COLONOSCOPY WITH PROPOFOL N/A 05/27/2020   Procedure: COLONOSCOPY WITH PROPOFOL;  Surgeon: [name], MD;  Location: [facility] ENDOSCOPY;  Service: Gastroenterology;  Laterality: N/A;  . FLEXIBLE SIGMOIDOSCOPY N/A 05/28/2020   Procedure: FLEXIBLE SIGMOIDOSCOPY;  Surgeon: [name], MD;  Location: [facility] ENDOSCOPY;  Service: Gastroenterology;  Laterality: N/A;  . IR NEPHROSTOMY EXCHANGE LEFT  07/11/2020  . IR NEPHROSTOMY EXCHANGE RIGHT  07/11/2020  . IR NEPHROSTOMY PLACEMENT LEFT  05/23/2020  . IR NEPHROSTOMY PLACEMENT RIGHT  05/23/2020  . IR URETERAL STENT LEFT NEW ACCESS W/O SEP NEPHROSTOMY CATH  08/15/2020  . IR URETERAL STENT RIGHT NEW ACCESS W/O SEP NEPHROSTOMY CATH  08/15/2020  . PORTACATH PLACEMENT Left 05/29/2020   Procedure: INSERTION PORT-A-CATH;  Surgeon: [name], MD;  Location: [facility] ORS;  Service: General;  Laterality: Left;    FAMILY HISTORY: Family History  Problem Relation Age of Onset  . Other Mother        Corticobasal degeneration  . Uterine cancer Mother   . Diabetes Maternal Grandmother   . Stroke Maternal Grandfather   . Stroke Paternal Grandfather   . Heart disease Father   . Pancreatic cancer Maternal Uncle 20  . Cancer Paternal Aunt        unk type  . Liver cancer Maternal Uncle   . Pancreatic cancer Cousin   . Cancer Cousin        unk type    ADVANCED DIRECTIVES (Y/N):  N  HEALTH MAINTENANCE: Social History  Tobacco Use  . Smoking status: Never Smoker  . Smokeless tobacco: Never Used  Vaping Use  . Vaping Use: Never used  Substance Use Topics  . Alcohol use: Never  . Drug use: Never     Colonoscopy:  PAP:  Bone density:  Lipid panel:  No Known Allergies  Current Outpatient Medications  Medication Sig Dispense Refill   . pantoprazole (PROTONIX) 40 MG tablet Take 1 tablet (40 mg total) by mouth 2 (two) times daily. 60 tablet 0  . HYDROcodone-acetaminophen (NORCO) 5-325 MG tablet Take 1 tablet by mouth every 6 (six) hours as needed for moderate pain. (Patient not taking: No sig reported) 30 tablet 0  . ondansetron (ZOFRAN) 4 MG tablet Take 4 mg by mouth every 8 (eight) hours as needed for nausea or vomiting. (Patient not taking: No sig reported)     No current facility-administered medications for this visit.    OBJECTIVE: Vitals:   09/24/20 0919  BP: (!) 87/72  Pulse: 84  Temp: (!) 96.1 F (35.6 C)  SpO2: 99%     Body mass index is 25.97 kg/m.    ECOG FS:0 - Asymptomatic  General: Well-developed, well-nourished, no acute distress. Eyes: Pink conjunctiva, anicteric sclera. HEENT: Normocephalic, moist mucous membranes. Lungs: No audible wheezing or coughing. Heart: Regular rate and rhythm. Abdomen: Soft, nontender, no obvious distention. Musculoskeletal: No edema, cyanosis, or clubbing. Neuro: Alert, answering all questions appropriately. Cranial nerves grossly intact. Skin: No rashes or petechiae noted. Psych: Normal affect.   LAB RESULTS:  Lab Results  Component Value Date   NA 138 09/24/2020   K 4.2 09/24/2020   CL 107 09/24/2020   CO2 23 09/24/2020   GLUCOSE 90 09/24/2020   BUN 19 09/24/2020   CREATININE 0.99 09/24/2020   CALCIUM 8.3 (L) 09/24/2020   PROT 7.3 09/24/2020   ALBUMIN 2.5 (L) 09/24/2020   AST 21 09/24/2020   ALT 12 09/24/2020   ALKPHOS 57 09/24/2020   BILITOT 0.5 09/24/2020   GFRNONAA >60 09/24/2020    Lab Results  Component Value Date   WBC 6.7 09/24/2020   NEUTROABS 3.8 09/24/2020   HGB 8.4 (L) 09/24/2020   HCT 27.4 (L) 09/24/2020   MCV 106.2 (H) 09/24/2020   PLT 325 09/24/2020     STUDIES: CT CHEST ABDOMEN PELVIS W CONTRAST  Result Date: 09/19/2020 CLINICAL DATA:  COLORECTAL CARCINOMA.  Restaging EXAM: CT CHEST, ABDOMEN, AND PELVIS WITH CONTRAST  TECHNIQUE: Multidetector CT imaging of the chest, abdomen and pelvis was performed following the standard protocol during bolus administration of intravenous contrast. CONTRAST:  192m OMNIPAQUE IOHEXOL 300 MG/ML  SOLN COMPARISON:  CT chest from 05/27/2020 and CT AP 05/22/2020 FINDINGS: CT CHEST FINDINGS Cardiovascular: Normal heart size.  No pericardial of fusion. Mediastinum/Nodes: Normal appearance of the thyroid gland. The trachea appears patent and is midline. Normal appearance of the esophagus. No axillary, supraclavicular, mediastinal, or hilar adenopathy. Lungs/Pleura: Trace pleural effusions noted bilaterally. There is subpleural consolidation involving the posterior and lateral right lower lobe, image 79/5. This is a new finding when compared with previous exam. Subpleural nodular density within the anteromedial right upper lobe is new from previous exam and is likely postinflammatory/infectious in etiology. No suspicious lung nodules identified at this time. Musculoskeletal: No suspicious bone lesions identified. There is a sclerotic lesion within the proximal body of sternum. This is fairly dense with well-defined margins and likely represents a benign bone island. No suspicious aggressive bone lesions identified. CT ABDOMEN PELVIS FINDINGS Hepatobiliary: 3 liver  lesions are identified within the right lobe of liver. -index lesion within dome of liver measures 2 cm, image 39/2. Previously 3.3 cm. -index lesion within the inferior right hepatic lobe measures 2.3 x 2.0 cm, image 61/2. This is compared with approximately 6.2 x 5.3 cm previously. -Index lesion along the posteromedial margin of right hepatic lobe measures 1.3 x 0.9 cm, image 54/2. Previously 3.0 x 2.0 cm. Pancreas: Unremarkable. No pancreatic ductal dilatation or surrounding inflammatory changes. Spleen: Normal in size without focal abnormality. Adrenals/Urinary Tract: Normal appearance of the adrenal glands. Bilateral nephroureteral stents  are in place. There is been interval improvement in bilateral hydronephrosis and hydroureter. Mild asymmetric atrophy of the right kidney. There is mild diffuse bladder wall thickening and enhancement. Stomach/Bowel: Stomach appears normal. The appendix is visualized and is within normal limits. The small bowel loops are unremarkable. Circumferential wall thickening of the mid and distal sigmoid colon and rectum compatible with known colorectal adenocarcinoma. Subjectively this appears decreased in size from previous exam. On axial image 104/2 the mass measures 4.0 x 2.9 cm. This is compared with 5.6 x 4.4 cm previously. Vascular/Lymphatic: No aneurysm. Interval improvement in abdominopelvic adenopathy. -Left periaortic lymph node measures 0.4 cm, image 60/2. Previously 1.8 cm. -Aortocaval lymph node measures 0.4 cm, image 75/2 previously 1.1 cm. -Left external iliac lymph node measures 0.6 cm short axis, image 103/2. This is compared with 2.7 cm previously. -Left posterior perirectal lymph node measures 0.7 cm, image 111/2. Previously 1.1 cm. Reproductive: The uterus is displaced into the left anterior pelvis by the colon mass. There is a dominant fibroid within the uterine fundus which measures 4.8 by 3.6 cm. Other: Soft tissue stranding and free fluid is noted within the presacral soft tissue space. No focal fluid collections. Musculoskeletal: No acute or significant osseous findings. IMPRESSION: 1. Interval response to therapy. There has been interval decrease in size of colorectal adenocarcinoma involving the mid and distal sigmoid colon and rectum. 2. Decrease in size of abdominopelvic adenopathy. 3. Decrease in size of liver metastases. 4. New subpleural consolidation involving the posterior and lateral right lower lobe is identified. This is a new finding when compared with previous exam and is favored to represent an area of pneumonia. Additionally, there is a small subpleural nodule within the anteromedial  right middle lobe which is new and is favored to be postinflammatory/infectious. Consider short-term interval follow-up to ensure resolution clinical correlation is advised. 5. Bilateral nephroureteral stents are in place with interval improvement in bilateral hydronephrosis and hydroureter. 6. Uterine fibroid. Electronically Signed   By: Kerby Moors M.D.   On: 09/19/2020 12:58    ASSESSMENT: Metastatic colorectal adenocarcinoma,  KRAS wild-type.  PLAN:   1. Metastatic colorectal adenocarcinoma: Although unclear whether malignancy originated in colon or rectum, given the pattern of spread to liver, will treat as a stage IV colon cancer.  CEA is only mildly elevated at 9.9.  Patient has had port placement.  Initial plan was to treat with FOLFOX plus Avastin every 2 weeks.  Can consider Panitumumab as second line.  Avastin was held for the first 6 cycles secondary to persistent rectal bleeding, but this is now resolved.  CT scan results from September 03, 2020 reviewed independently and report as above with significant improvement of patient's disease burden.  Proceed with cycle 7 of FOLFOX plus Avastin today.  Return to clinic in 2 days for pump removal and then in 2 weeks for further evaluation and consideration of cycle 8.  Appreciate  palliative care and genetics input.  2.  Anemia: Hemoglobin continues to slowly trend down at 8.4.  Monitor. 3.  Renal insufficiency: Resolved. 4.  Neutropenia: Resolved.  Udenyca as needed for remainder of cycles if needed. 5.  Hypokalemia: Resolved. 6.  Covid infection: Resolved.  Patient is now asymptomatic. 7.  Hypotension: Encourage fluid intake.  Proceed with treatment as above. 8.  Nephrostomy tubes: Internalized on August 15, 2020.  Patient expressed understanding and was in agreement with this plan. She also understands that She can call clinic at any time with any questions, concerns, or complaints.   Cancer Staging Metastatic colorectal cancer  [facility]) Staging form: Colon and Rectum, AJCC 8th Edition - Clinical stage from 05/31/2020: Stage IVC (cTX, cNX, pM1c) - Signed by [name], MD on 05/31/2020   [name], MD   09/25/2020 6:05 AM

## 2020-09-24 ENCOUNTER — Encounter: Payer: Self-pay | Admitting: Oncology

## 2020-09-24 ENCOUNTER — Inpatient Hospital Stay: Payer: Medicaid Other

## 2020-09-24 ENCOUNTER — Encounter (INDEPENDENT_AMBULATORY_CARE_PROVIDER_SITE_OTHER): Payer: Self-pay

## 2020-09-24 ENCOUNTER — Inpatient Hospital Stay (HOSPITAL_BASED_OUTPATIENT_CLINIC_OR_DEPARTMENT_OTHER): Payer: Medicaid Other | Admitting: Oncology

## 2020-09-24 ENCOUNTER — Other Ambulatory Visit: Payer: Self-pay

## 2020-09-24 VITALS — BP 87/72 | HR 84 | Temp 96.1°F | Wt 133.0 lb

## 2020-09-24 VITALS — BP 94/66 | HR 68

## 2020-09-24 DIAGNOSIS — C19 Malignant neoplasm of rectosigmoid junction: Secondary | ICD-10-CM | POA: Diagnosis not present

## 2020-09-24 DIAGNOSIS — Z79899 Other long term (current) drug therapy: Secondary | ICD-10-CM | POA: Diagnosis not present

## 2020-09-24 DIAGNOSIS — Z823 Family history of stroke: Secondary | ICD-10-CM | POA: Diagnosis not present

## 2020-09-24 DIAGNOSIS — I959 Hypotension, unspecified: Secondary | ICD-10-CM | POA: Diagnosis not present

## 2020-09-24 DIAGNOSIS — Z833 Family history of diabetes mellitus: Secondary | ICD-10-CM | POA: Diagnosis not present

## 2020-09-24 DIAGNOSIS — Z5112 Encounter for antineoplastic immunotherapy: Secondary | ICD-10-CM | POA: Diagnosis not present

## 2020-09-24 DIAGNOSIS — D649 Anemia, unspecified: Secondary | ICD-10-CM | POA: Diagnosis not present

## 2020-09-24 DIAGNOSIS — Z8049 Family history of malignant neoplasm of other genital organs: Secondary | ICD-10-CM | POA: Diagnosis not present

## 2020-09-24 DIAGNOSIS — Z800 Family history of malignant neoplasm of digestive organs: Secondary | ICD-10-CM | POA: Diagnosis not present

## 2020-09-24 DIAGNOSIS — Z5111 Encounter for antineoplastic chemotherapy: Secondary | ICD-10-CM | POA: Diagnosis not present

## 2020-09-24 DIAGNOSIS — R5383 Other fatigue: Secondary | ICD-10-CM | POA: Diagnosis not present

## 2020-09-24 DIAGNOSIS — Z809 Family history of malignant neoplasm, unspecified: Secondary | ICD-10-CM | POA: Diagnosis not present

## 2020-09-24 DIAGNOSIS — Z8249 Family history of ischemic heart disease and other diseases of the circulatory system: Secondary | ICD-10-CM | POA: Diagnosis not present

## 2020-09-24 DIAGNOSIS — Z5189 Encounter for other specified aftercare: Secondary | ICD-10-CM | POA: Diagnosis not present

## 2020-09-24 DIAGNOSIS — R531 Weakness: Secondary | ICD-10-CM | POA: Diagnosis not present

## 2020-09-24 LAB — CBC WITH DIFFERENTIAL/PLATELET
Abs Immature Granulocytes: 0.03 10*3/uL (ref 0.00–0.07)
Basophils Absolute: 0 10*3/uL (ref 0.0–0.1)
Basophils Relative: 1 %
Eosinophils Absolute: 0.1 10*3/uL (ref 0.0–0.5)
Eosinophils Relative: 2 %
HCT: 27.4 % — ABNORMAL LOW (ref 36.0–46.0)
Hemoglobin: 8.4 g/dL — ABNORMAL LOW (ref 12.0–15.0)
Immature Granulocytes: 0 %
Lymphocytes Relative: 31 %
Lymphs Abs: 2 10*3/uL (ref 0.7–4.0)
MCH: 32.6 pg (ref 26.0–34.0)
MCHC: 30.7 g/dL (ref 30.0–36.0)
MCV: 106.2 fL — ABNORMAL HIGH (ref 80.0–100.0)
Monocytes Absolute: 0.7 10*3/uL (ref 0.1–1.0)
Monocytes Relative: 10 %
Neutro Abs: 3.8 10*3/uL (ref 1.7–7.7)
Neutrophils Relative %: 56 %
Platelets: 325 10*3/uL (ref 150–400)
RBC: 2.58 MIL/uL — ABNORMAL LOW (ref 3.87–5.11)
RDW: 18.5 % — ABNORMAL HIGH (ref 11.5–15.5)
WBC: 6.7 10*3/uL (ref 4.0–10.5)
nRBC: 0 % (ref 0.0–0.2)

## 2020-09-24 LAB — COMPREHENSIVE METABOLIC PANEL
ALT: 12 U/L (ref 0–44)
AST: 21 U/L (ref 15–41)
Albumin: 2.5 g/dL — ABNORMAL LOW (ref 3.5–5.0)
Alkaline Phosphatase: 57 U/L (ref 38–126)
Anion gap: 8 (ref 5–15)
BUN: 19 mg/dL (ref 6–20)
CO2: 23 mmol/L (ref 22–32)
Calcium: 8.3 mg/dL — ABNORMAL LOW (ref 8.9–10.3)
Chloride: 107 mmol/L (ref 98–111)
Creatinine, Ser: 0.99 mg/dL (ref 0.44–1.00)
GFR, Estimated: >60 mL/min (ref >60)
Glucose, Bld: 90 mg/dL (ref 70–99)
Potassium: 4.2 mmol/L (ref 3.5–5.1)
Sodium: 138 mmol/L (ref 135–145)
Total Bilirubin: 0.5 mg/dL (ref 0.3–1.2)
Total Protein: 7.3 g/dL (ref 6.5–8.1)

## 2020-09-24 LAB — URINALYSIS, DIPSTICK ONLY
Bilirubin Urine: NEGATIVE
Glucose, UA: NEGATIVE mg/dL
Ketones, ur: NEGATIVE mg/dL
Nitrite: POSITIVE — AB
Protein, ur: NEGATIVE mg/dL
Specific Gravity, Urine: 1.01 (ref 1.005–1.030)
pH: 6 (ref 5.0–8.0)

## 2020-09-24 MED ORDER — DEXTROSE 5 % IV SOLN
Freq: Once | INTRAVENOUS | Status: AC
  Filled 2020-09-24: qty 250

## 2020-09-24 MED ORDER — OXALIPLATIN CHEMO INJECTION 100 MG/20ML
85.0000 mg/m2 | Freq: Once | INTRAVENOUS | Status: AC
  Administered 2020-09-24: 135 mg via INTRAVENOUS
  Filled 2020-09-24: qty 10

## 2020-09-24 MED ORDER — LEUCOVORIN CALCIUM INJECTION 350 MG
650.0000 mg | Freq: Once | INTRAMUSCULAR | Status: AC
  Administered 2020-09-24: 650 mg via INTRAVENOUS
  Filled 2020-09-24: qty 32.5

## 2020-09-24 MED ORDER — SODIUM CHLORIDE 0.9 % IV SOLN
10.0000 mg | Freq: Once | INTRAVENOUS | Status: AC
  Administered 2020-09-24: 10 mg via INTRAVENOUS
  Filled 2020-09-24: qty 10

## 2020-09-24 MED ORDER — SODIUM CHLORIDE 0.9 % IV SOLN
Freq: Once | INTRAVENOUS | Status: AC
  Filled 2020-09-24: qty 250

## 2020-09-24 MED ORDER — SODIUM CHLORIDE 0.9% FLUSH
10.0000 mL | Freq: Once | INTRAVENOUS | Status: AC
  Administered 2020-09-24: 10 mL via INTRAVENOUS
  Filled 2020-09-24: qty 10

## 2020-09-24 MED ORDER — FLUOROURACIL CHEMO INJECTION 2.5 GM/50ML
400.0000 mg/m2 | Freq: Once | INTRAVENOUS | Status: AC
  Administered 2020-09-24: 650 mg via INTRAVENOUS
  Filled 2020-09-24: qty 13

## 2020-09-24 MED ORDER — SODIUM CHLORIDE 0.9 % IV SOLN
10.0000 mg/kg | Freq: Once | INTRAVENOUS | Status: AC
  Administered 2020-09-24: 600 mg via INTRAVENOUS
  Filled 2020-09-24: qty 16

## 2020-09-24 MED ORDER — PALONOSETRON HCL INJECTION 0.25 MG/5ML
0.2500 mg | Freq: Once | INTRAVENOUS | Status: AC
  Administered 2020-09-24: 0.25 mg via INTRAVENOUS
  Filled 2020-09-24: qty 5

## 2020-09-24 MED ORDER — SODIUM CHLORIDE 0.9 % IV SOLN
2400.0000 mg/m2 | INTRAVENOUS | Status: DC
  Administered 2020-09-24: 3750 mg via INTRAVENOUS
  Filled 2020-09-24: qty 75

## 2020-09-24 MED ORDER — HEPARIN SOD (PORK) LOCK FLUSH 100 UNIT/ML IV SOLN
500.0000 [IU] | Freq: Once | INTRAVENOUS | Status: DC
  Filled 2020-09-24: qty 5

## 2020-09-26 ENCOUNTER — Inpatient Hospital Stay: Payer: Medicaid Other

## 2020-09-26 ENCOUNTER — Other Ambulatory Visit: Payer: Self-pay

## 2020-09-26 VITALS — BP 107/48 | HR 69 | Temp 97.5°F

## 2020-09-26 DIAGNOSIS — R5383 Other fatigue: Secondary | ICD-10-CM | POA: Diagnosis not present

## 2020-09-26 DIAGNOSIS — Z8049 Family history of malignant neoplasm of other genital organs: Secondary | ICD-10-CM | POA: Diagnosis not present

## 2020-09-26 DIAGNOSIS — Z800 Family history of malignant neoplasm of digestive organs: Secondary | ICD-10-CM | POA: Diagnosis not present

## 2020-09-26 DIAGNOSIS — Z5112 Encounter for antineoplastic immunotherapy: Secondary | ICD-10-CM | POA: Diagnosis not present

## 2020-09-26 DIAGNOSIS — I959 Hypotension, unspecified: Secondary | ICD-10-CM | POA: Diagnosis not present

## 2020-09-26 DIAGNOSIS — Z833 Family history of diabetes mellitus: Secondary | ICD-10-CM | POA: Diagnosis not present

## 2020-09-26 DIAGNOSIS — Z8249 Family history of ischemic heart disease and other diseases of the circulatory system: Secondary | ICD-10-CM | POA: Diagnosis not present

## 2020-09-26 DIAGNOSIS — Z79899 Other long term (current) drug therapy: Secondary | ICD-10-CM | POA: Diagnosis not present

## 2020-09-26 DIAGNOSIS — R531 Weakness: Secondary | ICD-10-CM | POA: Diagnosis not present

## 2020-09-26 DIAGNOSIS — Z5189 Encounter for other specified aftercare: Secondary | ICD-10-CM | POA: Diagnosis not present

## 2020-09-26 DIAGNOSIS — D649 Anemia, unspecified: Secondary | ICD-10-CM | POA: Diagnosis not present

## 2020-09-26 DIAGNOSIS — Z823 Family history of stroke: Secondary | ICD-10-CM | POA: Diagnosis not present

## 2020-09-26 DIAGNOSIS — C19 Malignant neoplasm of rectosigmoid junction: Secondary | ICD-10-CM

## 2020-09-26 DIAGNOSIS — Z809 Family history of malignant neoplasm, unspecified: Secondary | ICD-10-CM | POA: Diagnosis not present

## 2020-09-26 DIAGNOSIS — Z5111 Encounter for antineoplastic chemotherapy: Secondary | ICD-10-CM | POA: Diagnosis not present

## 2020-09-26 MED ORDER — SODIUM CHLORIDE 0.9% FLUSH
10.0000 mL | INTRAVENOUS | Status: DC | PRN
  Administered 2020-09-26: 10 mL
  Filled 2020-09-26: qty 10

## 2020-09-26 MED ORDER — HEPARIN SOD (PORK) LOCK FLUSH 100 UNIT/ML IV SOLN
INTRAVENOUS | Status: AC
  Filled 2020-09-26: qty 5

## 2020-09-26 MED ORDER — HEPARIN SOD (PORK) LOCK FLUSH 100 UNIT/ML IV SOLN
500.0000 [IU] | Freq: Once | INTRAVENOUS | Status: AC | PRN
  Administered 2020-09-26: 500 [IU]
  Filled 2020-09-26: qty 5

## 2020-09-26 MED ORDER — PEGFILGRASTIM-CBQV 6 MG/0.6ML ~~LOC~~ SOSY
6.0000 mg | PREFILLED_SYRINGE | Freq: Once | SUBCUTANEOUS | Status: AC
  Administered 2020-09-26: 6 mg via SUBCUTANEOUS
  Filled 2020-09-26: qty 0.6

## 2020-10-02 ENCOUNTER — Telehealth: Payer: Self-pay

## 2020-10-02 NOTE — Telephone Encounter (Signed)
Nutrition Follow-up:  Patient with metastatic colorectal adenocarcinoma.  Patient receiving folfox and bevacizumab.    Spoke with patient via phone. Patient reports that appetite is better.  Had fried potatoes with onions for breakfast, egg and tortilla.  Lunch was homemade vegetable soup.  Reports that she is in [place] with family and they feed her and help take care of her on her chemo off weeks.  Reports taste alterations but can taste vinegar, garlic flavors.  Medications: reviewed  Labs: reviewed  Anthropometrics:   Weight increased to 133 lb on 2/23 increased from 127 lb on 1/19  132 lb on 12/21   NUTRITION DIAGNOSIS: Inadequate oral intake improving   INTERVENTION:  Encouraged patient to continue eating high calorie, high protein foods for weight maintenance.   Discussed where to purchase "juice type" shakes.     MONITORING, EVALUATION, GOAL: weight trends, intake   NEXT VISIT: ~ 4-6 weeks, phone call  Hosam Mcfetridge B. Zenia Resides, [place], [place] Registered Dietitian [phone] (mobile)

## 2020-10-04 NOTE — Progress Notes (Signed)
[place]  [phone] Fax:(336) [phone]  ID: Anita Sullivan OB: 07/31/1966  MR#: [id number]  MCN#:[id number]  Patient Care Team: Valerie Roys, DO as PCP - General (Family Medicine) [name], MD as Consulting Physician (Oncology) [name], RN as Oncology Nurse Navigator  CHIEF COMPLAINT: Metastatic colorectal adenocarcinoma, KRAS wild-type  INTERVAL HISTORY: Patient returns to clinic today for further evaluation and consideration of cycle 8 of FOLFOX plus Avastin.  She currently feels well and is asymptomatic.  Patient states her weakness and fatigue have improved.  She no longer has rectal bleeding.  She has no neurologic complaints.  She has a good appetite and denies weight loss.  She denies any chest pain, shortness of breath, cough, or hemoptysis.  She denies any nausea, vomiting, constipation, or diarrhea.  She has no urinary complaints.  Patient offers no further specific complaints today.  REVIEW OF SYSTEMS:   Review of Systems  Constitutional: Positive for malaise/fatigue. Negative for fever and weight loss.  HENT: Negative.  Negative for congestion.   Respiratory: Negative.  Negative for cough, hemoptysis and shortness of breath.   Cardiovascular: Negative.  Negative for chest pain and leg swelling.  Gastrointestinal: Negative for blood in stool, constipation, nausea and vomiting.  Genitourinary: Negative.  Negative for flank pain and hematuria.  Musculoskeletal: Negative.  Negative for back pain.  Skin: Negative.  Negative for rash.  Neurological: Positive for weakness. Negative for dizziness, seizures and headaches.  Psychiatric/Behavioral: Negative.  The patient is not nervous/anxious.     As per HPI. Otherwise, a complete review of systems is negative.  PAST MEDICAL HISTORY: Past Medical History:  Diagnosis Date  . Colon cancer ([place])   . Family history of pancreatic cancer   . Family history of uterine  cancer     PAST SURGICAL HISTORY: Past Surgical History:  Procedure Laterality Date  . COLONOSCOPY WITH PROPOFOL N/A 05/27/2020   Procedure: COLONOSCOPY WITH PROPOFOL;  Surgeon: [name], MD;  Location: [facility] ENDOSCOPY;  Service: Gastroenterology;  Laterality: N/A;  . FLEXIBLE SIGMOIDOSCOPY N/A 05/28/2020   Procedure: FLEXIBLE SIGMOIDOSCOPY;  Surgeon: [name], MD;  Location: [facility] ENDOSCOPY;  Service: Gastroenterology;  Laterality: N/A;  . IR NEPHROSTOMY EXCHANGE LEFT  07/11/2020  . IR NEPHROSTOMY EXCHANGE RIGHT  07/11/2020  . IR NEPHROSTOMY PLACEMENT LEFT  05/23/2020  . IR NEPHROSTOMY PLACEMENT RIGHT  05/23/2020  . IR URETERAL STENT LEFT NEW ACCESS W/O SEP NEPHROSTOMY CATH  08/15/2020  . IR URETERAL STENT RIGHT NEW ACCESS W/O SEP NEPHROSTOMY CATH  08/15/2020  . PORTACATH PLACEMENT Left 05/29/2020   Procedure: INSERTION PORT-A-CATH;  Surgeon: [name], MD;  Location: [facility] ORS;  Service: General;  Laterality: Left;    FAMILY HISTORY: Family History  Problem Relation Age of Onset  . Other Mother        Corticobasal degeneration  . Uterine cancer Mother   . Diabetes Maternal Grandmother   . Stroke Maternal Grandfather   . Stroke Paternal Grandfather   . Heart disease Father   . Pancreatic cancer Maternal Uncle 62  . Cancer Paternal Aunt        unk type  . Liver cancer Maternal Uncle   . Pancreatic cancer Cousin   . Cancer Cousin        unk type    ADVANCED DIRECTIVES (Y/N):  N  HEALTH MAINTENANCE: Social History   Tobacco Use  . Smoking status: Never Smoker  . Smokeless tobacco: Never Used  Vaping Use  .  Vaping Use: Never used  Substance Use Topics  . Alcohol use: Never  . Drug use: Never     Colonoscopy:  PAP:  Bone density:  Lipid panel:  No Known Allergies  Current Outpatient Medications  Medication Sig Dispense Refill  . acetaminophen (TYLENOL) 325 MG tablet Take 650 mg by mouth every 6 (six) hours as needed.    . loratadine  (CLARITIN) 10 MG tablet Take 10 mg by mouth daily.    . ondansetron (ZOFRAN) 4 MG tablet Take 4 mg by mouth every 8 (eight) hours as needed for nausea or vomiting.    . pantoprazole (PROTONIX) 40 MG tablet Take 1 tablet (40 mg total) by mouth 2 (two) times daily. 60 tablet 0  . HYDROcodone-acetaminophen (NORCO) 5-325 MG tablet Take 1 tablet by mouth every 6 (six) hours as needed for moderate pain. (Patient not taking: No sig reported) 30 tablet 0   No current facility-administered medications for this visit.   Facility-Administered Medications Ordered in Other Visits  Medication Dose Route Frequency Provider Last Rate Last Admin  . 0.9 %  sodium chloride infusion   Intravenous Once [name], MD      . bevacizumab-bvzr (ZIRABEV) 600 mg in sodium chloride 0.9 % 100 mL chemo infusion  10 mg/kg (Treatment Plan Recorded) Intravenous Once [name], MD      . dexamethasone (DECADRON) 10 mg in sodium chloride 0.9 % 50 mL IVPB  10 mg Intravenous Once [name], MD      . dextrose 5 % solution   Intravenous Once [name], MD      . fluorouracil (ADRUCIL) 3,750 mg in sodium chloride 0.9 % 75 mL chemo infusion  2,400 mg/m2 (Treatment Plan Recorded) Intravenous 1 day or 1 dose [name], MD      . fluorouracil (ADRUCIL) chemo injection 650 mg  400 mg/m2 (Treatment Plan Recorded) Intravenous Once [name], MD      . leucovorin 650 mg in dextrose 5 % 250 mL infusion  414 mg/m2 (Treatment Plan Recorded) Intravenous Once [name], MD      . oxaliplatin (ELOXATIN) 135 mg in dextrose 5 % 500 mL chemo infusion  85 mg/m2 (Treatment Plan Recorded) Intravenous Once [name], MD      . palonosetron (ALOXI) injection 0.25 mg  0.25 mg Intravenous Once [name], MD      . sodium chloride flush (NS) 0.9 % injection 10 mL  10 mL Intravenous PRN [name], MD   10 mL at 10/08/20 0854    OBJECTIVE: Vitals:   10/08/20  0920  BP: (!) 98/59  Pulse: 68  Resp: 18  Temp: (!) 96.9 F (36.1 C)  SpO2: 100%     Body mass index is 25.94 kg/m.    ECOG FS:0 - Asymptomatic  General: Well-developed, well-nourished, no acute distress. Eyes: Pink conjunctiva, anicteric sclera. HEENT: Normocephalic, moist mucous membranes. Lungs: No audible wheezing or coughing. Heart: Regular rate and rhythm. Abdomen: Soft, nontender, no obvious distention. Musculoskeletal: No edema, cyanosis, or clubbing. Neuro: Alert, answering all questions appropriately. Cranial nerves grossly intact. Skin: No rashes or petechiae noted. Psych: Normal affect.  LAB RESULTS:  Lab Results  Component Value Date   NA 139 10/08/2020   K 3.3 (L) 10/08/2020   CL 107 10/08/2020   CO2 24 10/08/2020   GLUCOSE 89 10/08/2020   BUN 13 10/08/2020   CREATININE 1.05 (H) 10/08/2020   CALCIUM 8.2 (  L) 10/08/2020   PROT 7.1 10/08/2020   ALBUMIN 2.9 (L) 10/08/2020   AST 33 10/08/2020   ALT 15 10/08/2020   ALKPHOS 74 10/08/2020   BILITOT 0.4 10/08/2020   GFRNONAA >60 10/08/2020    Lab Results  Component Value Date   WBC 7.4 10/08/2020   NEUTROABS 4.0 10/08/2020   HGB 9.5 (L) 10/08/2020   HCT 30.8 (L) 10/08/2020   MCV 107.3 (H) 10/08/2020   PLT 206 10/08/2020     STUDIES: CT CHEST ABDOMEN PELVIS W CONTRAST  Result Date: 09/19/2020 CLINICAL DATA:  COLORECTAL CARCINOMA.  Restaging EXAM: CT CHEST, ABDOMEN, AND PELVIS WITH CONTRAST TECHNIQUE: Multidetector CT imaging of the chest, abdomen and pelvis was performed following the standard protocol during bolus administration of intravenous contrast. CONTRAST:  13m OMNIPAQUE IOHEXOL 300 MG/ML  SOLN COMPARISON:  CT chest from 05/27/2020 and CT AP 05/22/2020 FINDINGS: CT CHEST FINDINGS Cardiovascular: Normal heart size.  No pericardial of fusion. Mediastinum/Nodes: Normal appearance of the thyroid gland. The trachea appears patent and is midline. Normal appearance of the esophagus. No axillary,  supraclavicular, mediastinal, or hilar adenopathy. Lungs/Pleura: Trace pleural effusions noted bilaterally. There is subpleural consolidation involving the posterior and lateral right lower lobe, image 79/5. This is a new finding when compared with previous exam. Subpleural nodular density within the anteromedial right upper lobe is new from previous exam and is likely postinflammatory/infectious in etiology. No suspicious lung nodules identified at this time. Musculoskeletal: No suspicious bone lesions identified. There is a sclerotic lesion within the proximal body of sternum. This is fairly dense with well-defined margins and likely represents a benign bone island. No suspicious aggressive bone lesions identified. CT ABDOMEN PELVIS FINDINGS Hepatobiliary: 3 liver lesions are identified within the right lobe of liver. -index lesion within dome of liver measures 2 cm, image 39/2. Previously 3.3 cm. -index lesion within the inferior right hepatic lobe measures 2.3 x 2.0 cm, image 61/2. This is compared with approximately 6.2 x 5.3 cm previously. -Index lesion along the posteromedial margin of right hepatic lobe measures 1.3 x 0.9 cm, image 54/2. Previously 3.0 x 2.0 cm. Pancreas: Unremarkable. No pancreatic ductal dilatation or surrounding inflammatory changes. Spleen: Normal in size without focal abnormality. Adrenals/Urinary Tract: Normal appearance of the adrenal glands. Bilateral nephroureteral stents are in place. There is been interval improvement in bilateral hydronephrosis and hydroureter. Mild asymmetric atrophy of the right kidney. There is mild diffuse bladder wall thickening and enhancement. Stomach/Bowel: Stomach appears normal. The appendix is visualized and is within normal limits. The small bowel loops are unremarkable. Circumferential wall thickening of the mid and distal sigmoid colon and rectum compatible with known colorectal adenocarcinoma. Subjectively this appears decreased in size from  previous exam. On axial image 104/2 the mass measures 4.0 x 2.9 cm. This is compared with 5.6 x 4.4 cm previously. Vascular/Lymphatic: No aneurysm. Interval improvement in abdominopelvic adenopathy. -Left periaortic lymph node measures 0.4 cm, image 60/2. Previously 1.8 cm. -Aortocaval lymph node measures 0.4 cm, image 75/2 previously 1.1 cm. -Left external iliac lymph node measures 0.6 cm short axis, image 103/2. This is compared with 2.7 cm previously. -Left posterior perirectal lymph node measures 0.7 cm, image 111/2. Previously 1.1 cm. Reproductive: The uterus is displaced into the left anterior pelvis by the colon mass. There is a dominant fibroid within the uterine fundus which measures 4.8 by 3.6 cm. Other: Soft tissue stranding and free fluid is noted within the presacral soft tissue space. No focal fluid collections. Musculoskeletal: No acute  or significant osseous findings. IMPRESSION: 1. Interval response to therapy. There has been interval decrease in size of colorectal adenocarcinoma involving the mid and distal sigmoid colon and rectum. 2. Decrease in size of abdominopelvic adenopathy. 3. Decrease in size of liver metastases. 4. New subpleural consolidation involving the posterior and lateral right lower lobe is identified. This is a new finding when compared with previous exam and is favored to represent an area of pneumonia. Additionally, there is a small subpleural nodule within the anteromedial right middle lobe which is new and is favored to be postinflammatory/infectious. Consider short-term interval follow-up to ensure resolution clinical correlation is advised. 5. Bilateral nephroureteral stents are in place with interval improvement in bilateral hydronephrosis and hydroureter. 6. Uterine fibroid. Electronically Signed   By: Kerby Moors M.D.   On: 09/19/2020 12:58    ASSESSMENT: Metastatic colorectal adenocarcinoma,  KRAS wild-type.  PLAN:   1. Metastatic colorectal adenocarcinoma:  Although unclear whether malignancy originated in colon or rectum, given the pattern of spread to liver, will treat as a stage IV colon cancer.  CEA is only mildly elevated at 9.9.  Patient has had port placement.  Initial plan was to treat with FOLFOX plus Avastin every 2 weeks.  Can consider Panitumumab as second line.  Avastin was held for the first 6 cycles secondary to persistent rectal bleeding, but this is now resolved.  CT scan results from September 03, 2020 reviewed independently and report as above with significant improvement of patient's disease burden.  Proceed with cycle 8 of FOLFOX plus Avastin today.  Return to clinic in 2 days for pump removal and Udenyca and then in 2 weeks for further evaluation and consideration of cycle 9.  Will reimage after cycle 12.  Appreciate palliative care and genetics input.  2.  Anemia: Hemoglobin improved to 9.5, monitor. 3.  Renal insufficiency: Resolved. 4.  Neutropenia: Resolved.  Udenyca as above. 5.  Hypokalemia: Mild.  Patient was given dietary changes. 6.  Covid infection: Resolved.  Patient is now asymptomatic. 7.  Hypotension: Improved.  Patient is asymptomatic. 8.  Nephrostomy tubes: Internalized on August 15, 2020.  Patient expressed understanding and was in agreement with this plan. She also understands that She can call clinic at any time with any questions, concerns, or complaints.   Cancer Staging Metastatic colorectal cancer [facility]) Staging form: Colon and Rectum, AJCC 8th Edition - Clinical stage from 05/31/2020: Stage IVC (cTX, cNX, pM1c) - Signed by [name], MD on 05/31/2020   [name], MD   10/08/2020 9:52 AM

## 2020-10-08 ENCOUNTER — Inpatient Hospital Stay: Payer: Medicaid Other

## 2020-10-08 ENCOUNTER — Inpatient Hospital Stay: Payer: Medicaid Other | Attending: Oncology

## 2020-10-08 ENCOUNTER — Encounter: Payer: Self-pay | Admitting: Oncology

## 2020-10-08 ENCOUNTER — Inpatient Hospital Stay (HOSPITAL_BASED_OUTPATIENT_CLINIC_OR_DEPARTMENT_OTHER): Payer: Medicaid Other | Admitting: Oncology

## 2020-10-08 ENCOUNTER — Other Ambulatory Visit: Payer: Self-pay

## 2020-10-08 VITALS — BP 98/59 | HR 68 | Temp 96.9°F | Resp 18 | Wt 132.8 lb

## 2020-10-08 DIAGNOSIS — I959 Hypotension, unspecified: Secondary | ICD-10-CM | POA: Insufficient documentation

## 2020-10-08 DIAGNOSIS — C19 Malignant neoplasm of rectosigmoid junction: Secondary | ICD-10-CM

## 2020-10-08 DIAGNOSIS — Z8249 Family history of ischemic heart disease and other diseases of the circulatory system: Secondary | ICD-10-CM | POA: Insufficient documentation

## 2020-10-08 DIAGNOSIS — Z5189 Encounter for other specified aftercare: Secondary | ICD-10-CM | POA: Insufficient documentation

## 2020-10-08 DIAGNOSIS — Z833 Family history of diabetes mellitus: Secondary | ICD-10-CM | POA: Diagnosis not present

## 2020-10-08 DIAGNOSIS — Z823 Family history of stroke: Secondary | ICD-10-CM | POA: Diagnosis not present

## 2020-10-08 DIAGNOSIS — D259 Leiomyoma of uterus, unspecified: Secondary | ICD-10-CM | POA: Insufficient documentation

## 2020-10-08 DIAGNOSIS — Z8049 Family history of malignant neoplasm of other genital organs: Secondary | ICD-10-CM | POA: Insufficient documentation

## 2020-10-08 DIAGNOSIS — Z809 Family history of malignant neoplasm, unspecified: Secondary | ICD-10-CM | POA: Insufficient documentation

## 2020-10-08 DIAGNOSIS — Z5112 Encounter for antineoplastic immunotherapy: Secondary | ICD-10-CM | POA: Diagnosis not present

## 2020-10-08 DIAGNOSIS — R531 Weakness: Secondary | ICD-10-CM | POA: Insufficient documentation

## 2020-10-08 DIAGNOSIS — R5383 Other fatigue: Secondary | ICD-10-CM | POA: Diagnosis not present

## 2020-10-08 DIAGNOSIS — N1330 Unspecified hydronephrosis: Secondary | ICD-10-CM | POA: Insufficient documentation

## 2020-10-08 DIAGNOSIS — Z800 Family history of malignant neoplasm of digestive organs: Secondary | ICD-10-CM | POA: Insufficient documentation

## 2020-10-08 DIAGNOSIS — E876 Hypokalemia: Secondary | ICD-10-CM | POA: Insufficient documentation

## 2020-10-08 DIAGNOSIS — D649 Anemia, unspecified: Secondary | ICD-10-CM | POA: Insufficient documentation

## 2020-10-08 DIAGNOSIS — C787 Secondary malignant neoplasm of liver and intrahepatic bile duct: Secondary | ICD-10-CM | POA: Diagnosis not present

## 2020-10-08 DIAGNOSIS — Z5111 Encounter for antineoplastic chemotherapy: Secondary | ICD-10-CM | POA: Insufficient documentation

## 2020-10-08 DIAGNOSIS — Z79899 Other long term (current) drug therapy: Secondary | ICD-10-CM | POA: Diagnosis not present

## 2020-10-08 LAB — COMPREHENSIVE METABOLIC PANEL
ALT: 15 U/L (ref 0–44)
AST: 33 U/L (ref 15–41)
Albumin: 2.9 g/dL — ABNORMAL LOW (ref 3.5–5.0)
Alkaline Phosphatase: 74 U/L (ref 38–126)
Anion gap: 8 (ref 5–15)
BUN: 13 mg/dL (ref 6–20)
CO2: 24 mmol/L (ref 22–32)
Calcium: 8.2 mg/dL — ABNORMAL LOW (ref 8.9–10.3)
Chloride: 107 mmol/L (ref 98–111)
Creatinine, Ser: 1.05 mg/dL — ABNORMAL HIGH (ref 0.44–1.00)
GFR, Estimated: >60 mL/min (ref >60)
Glucose, Bld: 89 mg/dL (ref 70–99)
Potassium: 3.3 mmol/L — ABNORMAL LOW (ref 3.5–5.1)
Sodium: 139 mmol/L (ref 135–145)
Total Bilirubin: 0.4 mg/dL (ref 0.3–1.2)
Total Protein: 7.1 g/dL (ref 6.5–8.1)

## 2020-10-08 LAB — CBC WITH DIFFERENTIAL/PLATELET
Abs Immature Granulocytes: 0.08 10*3/uL — ABNORMAL HIGH (ref 0.00–0.07)
Basophils Absolute: 0 10*3/uL (ref 0.0–0.1)
Basophils Relative: 1 %
Eosinophils Absolute: 0.1 10*3/uL (ref 0.0–0.5)
Eosinophils Relative: 2 %
HCT: 30.8 % — ABNORMAL LOW (ref 36.0–46.0)
Hemoglobin: 9.5 g/dL — ABNORMAL LOW (ref 12.0–15.0)
Immature Granulocytes: 1 %
Lymphocytes Relative: 32 %
Lymphs Abs: 2.4 10*3/uL (ref 0.7–4.0)
MCH: 33.1 pg (ref 26.0–34.0)
MCHC: 30.8 g/dL (ref 30.0–36.0)
MCV: 107.3 fL — ABNORMAL HIGH (ref 80.0–100.0)
Monocytes Absolute: 0.7 10*3/uL (ref 0.1–1.0)
Monocytes Relative: 10 %
Neutro Abs: 4 10*3/uL (ref 1.7–7.7)
Neutrophils Relative %: 54 %
Platelets: 206 10*3/uL (ref 150–400)
RBC: 2.87 MIL/uL — ABNORMAL LOW (ref 3.87–5.11)
RDW: 16.7 % — ABNORMAL HIGH (ref 11.5–15.5)
WBC: 7.4 10*3/uL (ref 4.0–10.5)
nRBC: 0 % (ref 0.0–0.2)

## 2020-10-08 LAB — URINALYSIS, DIPSTICK ONLY
Bilirubin Urine: NEGATIVE
Glucose, UA: NEGATIVE mg/dL
Ketones, ur: NEGATIVE mg/dL
Nitrite: POSITIVE — AB
Protein, ur: NEGATIVE mg/dL
Specific Gravity, Urine: 1.005 (ref 1.005–1.030)
pH: 7 (ref 5.0–8.0)

## 2020-10-08 MED ORDER — SODIUM CHLORIDE 0.9 % IV SOLN
10.0000 mg/kg | Freq: Once | INTRAVENOUS | Status: AC
  Administered 2020-10-08: 600 mg via INTRAVENOUS
  Filled 2020-10-08: qty 16

## 2020-10-08 MED ORDER — SODIUM CHLORIDE 0.9 % IV SOLN
Freq: Once | INTRAVENOUS | Status: AC
  Filled 2020-10-08: qty 250

## 2020-10-08 MED ORDER — PALONOSETRON HCL INJECTION 0.25 MG/5ML
0.2500 mg | Freq: Once | INTRAVENOUS | Status: AC
  Administered 2020-10-08: 0.25 mg via INTRAVENOUS
  Filled 2020-10-08: qty 5

## 2020-10-08 MED ORDER — OXALIPLATIN CHEMO INJECTION 100 MG/20ML
85.0000 mg/m2 | Freq: Once | INTRAVENOUS | Status: AC
  Administered 2020-10-08: 135 mg via INTRAVENOUS
  Filled 2020-10-08: qty 20

## 2020-10-08 MED ORDER — DEXTROSE 5 % IV SOLN
Freq: Once | INTRAVENOUS | Status: AC
Start: 2020-10-08 — End: 2020-10-08
  Filled 2020-10-08: qty 250

## 2020-10-08 MED ORDER — LEUCOVORIN CALCIUM INJECTION 350 MG
414.0000 mg/m2 | Freq: Once | INTRAVENOUS | Status: AC
  Administered 2020-10-08: 650 mg via INTRAVENOUS
  Filled 2020-10-08: qty 10

## 2020-10-08 MED ORDER — SODIUM CHLORIDE 0.9 % IV SOLN
2400.0000 mg/m2 | INTRAVENOUS | Status: DC
  Administered 2020-10-08: 3750 mg via INTRAVENOUS
  Filled 2020-10-08: qty 75

## 2020-10-08 MED ORDER — SODIUM CHLORIDE 0.9% FLUSH
10.0000 mL | INTRAVENOUS | Status: DC | PRN
  Administered 2020-10-08: 10 mL via INTRAVENOUS
  Filled 2020-10-08: qty 10

## 2020-10-08 MED ORDER — FLUOROURACIL CHEMO INJECTION 2.5 GM/50ML
400.0000 mg/m2 | Freq: Once | INTRAVENOUS | Status: AC
  Administered 2020-10-08: 650 mg via INTRAVENOUS
  Filled 2020-10-08: qty 13

## 2020-10-08 MED ORDER — SODIUM CHLORIDE 0.9 % IV SOLN
10.0000 mg | Freq: Once | INTRAVENOUS | Status: AC
  Administered 2020-10-08: 10 mg via INTRAVENOUS
  Filled 2020-10-08: qty 10

## 2020-10-08 NOTE — Progress Notes (Signed)
Patient here for oncology follow-up appointment, expresses concerns of diarrhea and loss of taste from chemo.

## 2020-10-10 ENCOUNTER — Inpatient Hospital Stay: Payer: Medicaid Other

## 2020-10-10 ENCOUNTER — Other Ambulatory Visit: Payer: Self-pay

## 2020-10-10 VITALS — BP 95/63 | HR 96 | Temp 100.2°F | Resp 18

## 2020-10-10 DIAGNOSIS — C19 Malignant neoplasm of rectosigmoid junction: Secondary | ICD-10-CM

## 2020-10-10 DIAGNOSIS — Z5112 Encounter for antineoplastic immunotherapy: Secondary | ICD-10-CM | POA: Diagnosis not present

## 2020-10-10 MED ORDER — PEGFILGRASTIM-CBQV 6 MG/0.6ML ~~LOC~~ SOSY
6.0000 mg | PREFILLED_SYRINGE | Freq: Once | SUBCUTANEOUS | Status: AC
Start: 2020-10-10 — End: 2020-10-10
  Administered 2020-10-10: 6 mg via SUBCUTANEOUS
  Filled 2020-10-10: qty 0.6

## 2020-10-10 MED ORDER — HEPARIN SOD (PORK) LOCK FLUSH 100 UNIT/ML IV SOLN
INTRAVENOUS | Status: AC
  Filled 2020-10-10: qty 5

## 2020-10-10 MED ORDER — SODIUM CHLORIDE 0.9% FLUSH
10.0000 mL | INTRAVENOUS | Status: DC | PRN
  Administered 2020-10-10: 10 mL
  Filled 2020-10-10: qty 10

## 2020-10-10 MED ORDER — HEPARIN SOD (PORK) LOCK FLUSH 100 UNIT/ML IV SOLN
500.0000 [IU] | Freq: Once | INTRAVENOUS | Status: AC | PRN
  Administered 2020-10-10: 500 [IU]
  Filled 2020-10-10: qty 5

## 2020-10-10 NOTE — Progress Notes (Signed)
1430: pt reports "not feeling good", feeling tired, excessively thirsty, and a "slight headache".  Temp 100.2. Per Dr. Grayland Ormond proceed with scheduled treatment, Faythe Casa NP to assess pt in infusion.  1445: Per Faythe Casa NP (at chairside) okay to d/c pt home, pt to call clinic if new symptoms develop of if current symptoms worsens, pt verbalizes understanding. Pt stable at discharge.

## 2020-10-16 ENCOUNTER — Telehealth: Payer: Self-pay | Admitting: *Deleted

## 2020-10-16 NOTE — Telephone Encounter (Signed)
[employer] called requesting information regarding patient being out of work, they need to know how long we anticipte patient to have treatment and how long after treatment for recovery before she can return to work, what do we expect of her condition to be, improvement, no change or deterioration so that they can set up her disability payments.

## 2020-10-16 NOTE — Telephone Encounter (Signed)
Do they have a form to fill out or just need a letter?

## 2020-10-17 NOTE — Progress Notes (Signed)
[place]  Telephone:(336) [phone] Fax:(336) [phone]  ID: Anita Sullivan OB: 1965/11/11  MR#: [id number]  YQI#:[id number]  Patient Care Team: [name], NP as PCP - General (Nurse Practitioner) [name], MD as Consulting Physician (Oncology) [name], RN as Oncology Nurse Navigator  CHIEF COMPLAINT: Metastatic colorectal adenocarcinoma, KRAS wild-type  INTERVAL HISTORY: Patient returns to clinic today for further evaluation and consideration of cycle 9 of FOLFOX plus Avastin.  She currently feels well and is asymptomatic.  She no longer complains of weakness or fatigue. She no longer has rectal bleeding.  She has no neurologic complaints.  She has a good appetite and denies weight loss.  She denies any chest pain, shortness of breath, cough, or hemoptysis.  She denies any nausea, vomiting, constipation, or diarrhea.  She has no urinary complaints.  Patient offers no specific complaints today.  REVIEW OF SYSTEMS:   Review of Systems  Constitutional: Negative.  Negative for fever, malaise/fatigue and weight loss.  HENT: Negative.  Negative for congestion.   Respiratory: Negative.  Negative for cough, hemoptysis and shortness of breath.   Cardiovascular: Negative.  Negative for chest pain and leg swelling.  Gastrointestinal: Negative for blood in stool, constipation, nausea and vomiting.  Genitourinary: Negative.  Negative for flank pain and hematuria.  Musculoskeletal: Negative.  Negative for back pain.  Skin: Negative.  Negative for rash.  Neurological: Negative.  Negative for dizziness, seizures, weakness and headaches.  Psychiatric/Behavioral: Negative.  The patient is not nervous/anxious.     As per HPI. Otherwise, a complete review of systems is negative.  PAST MEDICAL HISTORY: Past Medical History:  Diagnosis Date  . Colon cancer ([place])   . Family history of pancreatic cancer   . Family history of uterine cancer     PAST  SURGICAL HISTORY: Past Surgical History:  Procedure Laterality Date  . COLONOSCOPY WITH PROPOFOL N/A 05/27/2020   Procedure: COLONOSCOPY WITH PROPOFOL;  Surgeon: [name], MD;  Location: [facility] ENDOSCOPY;  Service: Gastroenterology;  Laterality: N/A;  . FLEXIBLE SIGMOIDOSCOPY N/A 05/28/2020   Procedure: FLEXIBLE SIGMOIDOSCOPY;  Surgeon: [name], MD;  Location: [facility] ENDOSCOPY;  Service: Gastroenterology;  Laterality: N/A;  . IR NEPHROSTOMY EXCHANGE LEFT  07/11/2020  . IR NEPHROSTOMY EXCHANGE RIGHT  07/11/2020  . IR NEPHROSTOMY PLACEMENT LEFT  05/23/2020  . IR NEPHROSTOMY PLACEMENT RIGHT  05/23/2020  . IR URETERAL STENT LEFT NEW ACCESS W/O SEP NEPHROSTOMY CATH  08/15/2020  . IR URETERAL STENT RIGHT NEW ACCESS W/O SEP NEPHROSTOMY CATH  08/15/2020  . PORTACATH PLACEMENT Left 05/29/2020   Procedure: INSERTION PORT-A-CATH;  Surgeon: [name], MD;  Location: [facility] ORS;  Service: General;  Laterality: Left;    FAMILY HISTORY: Family History  Problem Relation Age of Onset  . Other Mother        Corticobasal degeneration  . Uterine cancer Mother   . Diabetes Maternal Grandmother   . Stroke Maternal Grandfather   . Stroke Paternal Grandfather   . Heart disease Father   . Pancreatic cancer Maternal Uncle 70  . Cancer Paternal Aunt        unk type  . Liver cancer Maternal Uncle   . Pancreatic cancer Cousin   . Cancer Cousin        unk type    ADVANCED DIRECTIVES (Y/N):  N  HEALTH MAINTENANCE: Social History   Tobacco Use  . Smoking status: Never Smoker  . Smokeless tobacco: Never Used  Vaping Use  . Vaping  Use: Never used  Substance Use Topics  . Alcohol use: Never  . Drug use: Never     Colonoscopy:  PAP:  Bone density:  Lipid panel:  No Known Allergies  Current Outpatient Medications  Medication Sig Dispense Refill  . acetaminophen (TYLENOL) 325 MG tablet Take 650 mg by mouth every 6 (six) hours as needed.    . loratadine (CLARITIN) 10 MG  tablet Take 10 mg by mouth daily.    . ondansetron (ZOFRAN) 4 MG tablet Take 4 mg by mouth every 8 (eight) hours as needed for nausea or vomiting.    . pantoprazole (PROTONIX) 40 MG tablet Take 1 tablet (40 mg total) by mouth 2 (two) times daily. 60 tablet 0  . HYDROcodone-acetaminophen (NORCO) 5-325 MG tablet Take 1 tablet by mouth every 6 (six) hours as needed for moderate pain. (Patient not taking: No sig reported) 30 tablet 0   No current facility-administered medications for this visit.   Facility-Administered Medications Ordered in Other Visits  Medication Dose Route Frequency Provider Last Rate Last Admin  . bevacizumab-bvzr (ZIRABEV) 600 mg in sodium chloride 0.9 % 100 mL chemo infusion  10 mg/kg (Treatment Plan Recorded) Intravenous Once [name], MD      . dexamethasone (DECADRON) 10 mg in sodium chloride 0.9 % 50 mL IVPB  10 mg Intravenous Once [name], MD      . dextrose 5 % solution   Intravenous Once [name], MD      . fluorouracil (ADRUCIL) 3,750 mg in sodium chloride 0.9 % 75 mL chemo infusion  2,400 mg/m2 (Treatment Plan Recorded) Intravenous 1 day or 1 dose [name], MD      . fluorouracil (ADRUCIL) chemo injection 650 mg  400 mg/m2 (Treatment Plan Recorded) Intravenous Once [name], MD      . heparin lock flush 100 unit/mL  500 Units Intravenous Once [name], MD      . leucovorin 650 mg in dextrose 5 % 250 mL infusion  414 mg/m2 (Treatment Plan Recorded) Intravenous Once [name], MD      . oxaliplatin (ELOXATIN) 135 mg in dextrose 5 % 500 mL chemo infusion  85 mg/m2 (Treatment Plan Recorded) Intravenous Once [name], MD        OBJECTIVE: Vitals:   10/22/20 0900  BP: 101/69  Pulse: 76  Resp: 20  Temp: (!) 97.4 F (36.3 C)     Body mass index is 25.76 kg/m.    ECOG FS:0 - Asymptomatic  General: Well-developed, well-nourished, no acute distress. Eyes: Pink conjunctiva,  anicteric sclera. HEENT: Normocephalic, moist mucous membranes. Lungs: No audible wheezing or coughing. Heart: Regular rate and rhythm. Abdomen: Soft, nontender, no obvious distention. Musculoskeletal: No edema, cyanosis, or clubbing. Neuro: Alert, answering all questions appropriately. Cranial nerves grossly intact. Skin: No rashes or petechiae noted. Psych: Normal affect.  LAB RESULTS:  Lab Results  Component Value Date   NA 137 10/22/2020   K 3.7 10/22/2020   CL 106 10/22/2020   CO2 25 10/22/2020   GLUCOSE 86 10/22/2020   BUN 17 10/22/2020   CREATININE 1.08 (H) 10/22/2020   CALCIUM 8.2 (L) 10/22/2020   PROT 6.8 10/22/2020   ALBUMIN 3.0 (L) 10/22/2020   AST 25 10/22/2020   ALT 14 10/22/2020   ALKPHOS 94 10/22/2020   BILITOT 0.5 10/22/2020   GFRNONAA >60 10/22/2020    Lab Results  Component Value Date   WBC 7.9 10/22/2020  NEUTROABS 4.5 10/22/2020   HGB 9.8 (L) 10/22/2020   HCT 31.0 (L) 10/22/2020   MCV 103.0 (H) 10/22/2020   PLT 157 10/22/2020     STUDIES: No results found.  ASSESSMENT: Metastatic colorectal adenocarcinoma,  KRAS wild-type.  PLAN:   1. Metastatic colorectal adenocarcinoma: Although unclear whether malignancy originated in colon or rectum, given the pattern of spread to liver, will treat as a stage IV colon cancer.  CEA is only mildly elevated at 9.9.  Patient has had port placement.  Initial plan was to treat with FOLFOX plus Avastin every 2 weeks.  Can consider Panitumumab as second line.  Avastin was held for the first 6 cycles secondary to persistent rectal bleeding, but this is now resolved.  CT scan results from September 03, 2020 reviewed independently with significant improvement of patient's disease burden.  Proceed with cycle 9 of FOLFOX plus Avastin today.  Return to clinic in 2 days for pump removal and Udenyca and then in 2 weeks for further evaluation and consideration of cycle 10.  Will reimage at the conclusion of cycle 12.  Appreciate  palliative care and genetics input.  2.  Anemia: Hemoglobin continues to slowly improve and is now 9.8. 3.  Renal insufficiency: Mild, monitor. 4.  Neutropenia: Resolved.  Udenyca as above. 5.  Hypokalemia: Resolved with dietary changes. 6.  Covid infection: Resolved.  Patient is now asymptomatic. 7.  Hypotension: Improved.  Patient is asymptomatic. 8.  Nephrostomy tubes: Internalized on August 15, 2020.  Patient expressed understanding and was in agreement with this plan. She also understands that She can call clinic at any time with any questions, concerns, or complaints.   Cancer Staging Metastatic colorectal cancer [facility]) Staging form: Colon and Rectum, AJCC 8th Edition - Clinical stage from 05/31/2020: Stage IVC (cTX, cNX, pM1c) - Signed by [name], MD on 05/31/2020   [name], MD   10/22/2020 9:52 AM

## 2020-10-17 NOTE — Telephone Encounter (Signed)
I was able to call and give needed information over the phone. No forms needed at this time. Thanks!

## 2020-10-17 NOTE — Telephone Encounter (Signed)
She said something about she was going to fax something also

## 2020-10-17 NOTE — Telephone Encounter (Signed)
Can you call her and ask what she needs?  Thanks!

## 2020-10-22 ENCOUNTER — Inpatient Hospital Stay: Payer: Medicaid Other

## 2020-10-22 ENCOUNTER — Encounter: Payer: Self-pay | Admitting: *Deleted

## 2020-10-22 ENCOUNTER — Encounter: Payer: Self-pay | Admitting: Oncology

## 2020-10-22 ENCOUNTER — Inpatient Hospital Stay (HOSPITAL_BASED_OUTPATIENT_CLINIC_OR_DEPARTMENT_OTHER): Payer: Medicaid Other | Admitting: Oncology

## 2020-10-22 ENCOUNTER — Other Ambulatory Visit: Payer: Self-pay

## 2020-10-22 VITALS — BP 101/69 | HR 76 | Temp 97.4°F | Resp 20 | Wt 131.9 lb

## 2020-10-22 DIAGNOSIS — C19 Malignant neoplasm of rectosigmoid junction: Secondary | ICD-10-CM

## 2020-10-22 DIAGNOSIS — Z5112 Encounter for antineoplastic immunotherapy: Secondary | ICD-10-CM | POA: Diagnosis not present

## 2020-10-22 LAB — COMPREHENSIVE METABOLIC PANEL
ALT: 14 U/L (ref 0–44)
AST: 25 U/L (ref 15–41)
Albumin: 3 g/dL — ABNORMAL LOW (ref 3.5–5.0)
Alkaline Phosphatase: 94 U/L (ref 38–126)
Anion gap: 6 (ref 5–15)
BUN: 17 mg/dL (ref 6–20)
CO2: 25 mmol/L (ref 22–32)
Calcium: 8.2 mg/dL — ABNORMAL LOW (ref 8.9–10.3)
Chloride: 106 mmol/L (ref 98–111)
Creatinine, Ser: 1.08 mg/dL — ABNORMAL HIGH (ref 0.44–1.00)
GFR, Estimated: >60 mL/min (ref >60)
Glucose, Bld: 86 mg/dL (ref 70–99)
Potassium: 3.7 mmol/L (ref 3.5–5.1)
Sodium: 137 mmol/L (ref 135–145)
Total Bilirubin: 0.5 mg/dL (ref 0.3–1.2)
Total Protein: 6.8 g/dL (ref 6.5–8.1)

## 2020-10-22 LAB — CBC WITH DIFFERENTIAL/PLATELET
Abs Immature Granulocytes: 0.06 10*3/uL (ref 0.00–0.07)
Basophils Absolute: 0 10*3/uL (ref 0.0–0.1)
Basophils Relative: 1 %
Eosinophils Absolute: 0.1 10*3/uL (ref 0.0–0.5)
Eosinophils Relative: 1 %
HCT: 31 % — ABNORMAL LOW (ref 36.0–46.0)
Hemoglobin: 9.8 g/dL — ABNORMAL LOW (ref 12.0–15.0)
Immature Granulocytes: 1 %
Lymphocytes Relative: 29 %
Lymphs Abs: 2.3 10*3/uL (ref 0.7–4.0)
MCH: 32.6 pg (ref 26.0–34.0)
MCHC: 31.6 g/dL (ref 30.0–36.0)
MCV: 103 fL — ABNORMAL HIGH (ref 80.0–100.0)
Monocytes Absolute: 0.8 10*3/uL (ref 0.1–1.0)
Monocytes Relative: 11 %
Neutro Abs: 4.5 10*3/uL (ref 1.7–7.7)
Neutrophils Relative %: 57 %
Platelets: 157 10*3/uL (ref 150–400)
RBC: 3.01 MIL/uL — ABNORMAL LOW (ref 3.87–5.11)
RDW: 15.7 % — ABNORMAL HIGH (ref 11.5–15.5)
WBC: 7.9 10*3/uL (ref 4.0–10.5)
nRBC: 0 % (ref 0.0–0.2)

## 2020-10-22 LAB — URINALYSIS, DIPSTICK ONLY
Bilirubin Urine: NEGATIVE
Glucose, UA: NEGATIVE mg/dL
Ketones, ur: NEGATIVE mg/dL
Nitrite: POSITIVE — AB
Protein, ur: 30 mg/dL — AB
Specific Gravity, Urine: 1.006 (ref 1.005–1.030)
pH: 7 (ref 5.0–8.0)

## 2020-10-22 MED ORDER — SODIUM CHLORIDE 0.9% FLUSH
10.0000 mL | Freq: Once | INTRAVENOUS | Status: AC
  Administered 2020-10-22: 10 mL via INTRAVENOUS
  Filled 2020-10-22: qty 10

## 2020-10-22 MED ORDER — FLUOROURACIL CHEMO INJECTION 2.5 GM/50ML
400.0000 mg/m2 | Freq: Once | INTRAVENOUS | Status: AC
  Administered 2020-10-22: 650 mg via INTRAVENOUS
  Filled 2020-10-22: qty 13

## 2020-10-22 MED ORDER — SODIUM CHLORIDE 0.9 % IV SOLN
10.0000 mg | Freq: Once | INTRAVENOUS | Status: AC
  Administered 2020-10-22: 10 mg via INTRAVENOUS
  Filled 2020-10-22: qty 10

## 2020-10-22 MED ORDER — HEPARIN SOD (PORK) LOCK FLUSH 100 UNIT/ML IV SOLN
500.0000 [IU] | Freq: Once | INTRAVENOUS | Status: DC
  Filled 2020-10-22: qty 5

## 2020-10-22 MED ORDER — PALONOSETRON HCL INJECTION 0.25 MG/5ML
0.2500 mg | Freq: Once | INTRAVENOUS | Status: AC
  Administered 2020-10-22: 0.25 mg via INTRAVENOUS
  Filled 2020-10-22: qty 5

## 2020-10-22 MED ORDER — SODIUM CHLORIDE 0.9 % IV SOLN
2400.0000 mg/m2 | INTRAVENOUS | Status: DC
  Administered 2020-10-22: 3750 mg via INTRAVENOUS
  Filled 2020-10-22: qty 75

## 2020-10-22 MED ORDER — LEUCOVORIN CALCIUM INJECTION 350 MG
414.0000 mg/m2 | Freq: Once | INTRAVENOUS | Status: AC
  Administered 2020-10-22: 650 mg via INTRAVENOUS
  Filled 2020-10-22: qty 25

## 2020-10-22 MED ORDER — SODIUM CHLORIDE 0.9 % IV SOLN
Freq: Once | INTRAVENOUS | Status: AC
Start: 2020-10-22 — End: 2020-10-22
  Filled 2020-10-22: qty 250

## 2020-10-22 MED ORDER — OXALIPLATIN CHEMO INJECTION 100 MG/20ML
85.0000 mg/m2 | Freq: Once | INTRAVENOUS | Status: AC
  Administered 2020-10-22: 135 mg via INTRAVENOUS
  Filled 2020-10-22: qty 10

## 2020-10-22 MED ORDER — DEXTROSE 5 % IV SOLN
Freq: Once | INTRAVENOUS | Status: AC
  Filled 2020-10-22: qty 250

## 2020-10-22 MED ORDER — SODIUM CHLORIDE 0.9 % IV SOLN
10.0000 mg/kg | Freq: Once | INTRAVENOUS | Status: AC
  Administered 2020-10-22: 600 mg via INTRAVENOUS
  Filled 2020-10-22: qty 16

## 2020-10-22 NOTE — Progress Notes (Signed)
Patient denies any concerns today.  

## 2020-10-22 NOTE — Patient Instructions (Signed)
Order for Depend Undergarments faxed to [employer] per patient request, [phone]) [phone].

## 2020-10-24 ENCOUNTER — Other Ambulatory Visit: Payer: Self-pay

## 2020-10-24 ENCOUNTER — Inpatient Hospital Stay: Payer: Medicaid Other

## 2020-10-24 VITALS — BP 87/60 | HR 85 | Temp 97.3°F | Resp 20

## 2020-10-24 DIAGNOSIS — Z5112 Encounter for antineoplastic immunotherapy: Secondary | ICD-10-CM | POA: Diagnosis not present

## 2020-10-24 DIAGNOSIS — C19 Malignant neoplasm of rectosigmoid junction: Secondary | ICD-10-CM

## 2020-10-24 MED ORDER — HEPARIN SOD (PORK) LOCK FLUSH 100 UNIT/ML IV SOLN
500.0000 [IU] | Freq: Once | INTRAVENOUS | Status: AC
  Administered 2020-10-24: 500 [IU] via INTRAVENOUS
  Filled 2020-10-24: qty 5

## 2020-10-24 MED ORDER — PEGFILGRASTIM-CBQV 6 MG/0.6ML ~~LOC~~ SOSY
6.0000 mg | PREFILLED_SYRINGE | Freq: Once | SUBCUTANEOUS | Status: AC
  Administered 2020-10-24: 6 mg via SUBCUTANEOUS
  Filled 2020-10-24: qty 0.6

## 2020-10-30 NOTE — Progress Notes (Signed)
[place]  Telephone:(336) [phone] Fax:(336) [phone]  ID: Anita Sullivan OB: 1965/10/07  MR#: [id number]  ESP#:[id number]  Patient Care Team: [name], NP as PCP - General (Nurse Practitioner) [name], MD as Consulting Physician (Oncology) [name], RN as Oncology Nurse Navigator  CHIEF COMPLAINT: Metastatic colorectal adenocarcinoma, KRAS wild-type  INTERVAL HISTORY: Patient returns to clinic today for further evaluation and consideration of cycle 10 of FOLFOX plus Avastin.  She currently feels well and is asymptomatic.  She denies any weakness or fatigue. She no longer has rectal bleeding.  She has no neurologic complaints.  She has a good appetite and denies weight loss.  She denies any chest pain, shortness of breath, cough, or hemoptysis.  She denies any nausea, vomiting, constipation, or diarrhea.  She has no urinary complaints.  Patient offers no specific complaints today.  REVIEW OF SYSTEMS:   Review of Systems  Constitutional: Negative.  Negative for fever, malaise/fatigue and weight loss.  HENT: Negative.  Negative for congestion.   Respiratory: Negative.  Negative for cough, hemoptysis and shortness of breath.   Cardiovascular: Negative.  Negative for chest pain and leg swelling.  Gastrointestinal: Negative for blood in stool, constipation, nausea and vomiting.  Genitourinary: Negative.  Negative for flank pain and hematuria.  Musculoskeletal: Negative.  Negative for back pain.  Skin: Negative.  Negative for rash.  Neurological: Negative.  Negative for dizziness, seizures, weakness and headaches.  Psychiatric/Behavioral: Negative.  The patient is not nervous/anxious.     As per HPI. Otherwise, a complete review of systems is negative.  PAST MEDICAL HISTORY: Past Medical History:  Diagnosis Date  . Colon cancer ([place])   . Family history of pancreatic cancer   . Family history of uterine cancer     PAST SURGICAL  HISTORY: Past Surgical History:  Procedure Laterality Date  . COLONOSCOPY WITH PROPOFOL N/A 05/27/2020   Procedure: COLONOSCOPY WITH PROPOFOL;  Surgeon: [name], MD;  Location: [facility] ENDOSCOPY;  Service: Gastroenterology;  Laterality: N/A;  . FLEXIBLE SIGMOIDOSCOPY N/A 05/28/2020   Procedure: FLEXIBLE SIGMOIDOSCOPY;  Surgeon: [name], MD;  Location: [facility] ENDOSCOPY;  Service: Gastroenterology;  Laterality: N/A;  . IR NEPHROSTOMY EXCHANGE LEFT  07/11/2020  . IR NEPHROSTOMY EXCHANGE RIGHT  07/11/2020  . IR NEPHROSTOMY PLACEMENT LEFT  05/23/2020  . IR NEPHROSTOMY PLACEMENT RIGHT  05/23/2020  . IR URETERAL STENT LEFT NEW ACCESS W/O SEP NEPHROSTOMY CATH  08/15/2020  . IR URETERAL STENT RIGHT NEW ACCESS W/O SEP NEPHROSTOMY CATH  08/15/2020  . PORTACATH PLACEMENT Left 05/29/2020   Procedure: INSERTION PORT-A-CATH;  Surgeon: [name], MD;  Location: [facility] ORS;  Service: General;  Laterality: Left;    FAMILY HISTORY: Family History  Problem Relation Age of Onset  . Other Mother        Corticobasal degeneration  . Uterine cancer Mother   . Diabetes Maternal Grandmother   . Stroke Maternal Grandfather   . Stroke Paternal Grandfather   . Heart disease Father   . Pancreatic cancer Maternal Uncle 20  . Cancer Paternal Aunt        unk type  . Liver cancer Maternal Uncle   . Pancreatic cancer Cousin   . Cancer Cousin        unk type    ADVANCED DIRECTIVES (Y/N):  N  HEALTH MAINTENANCE: Social History   Tobacco Use  . Smoking status: Never Smoker  . Smokeless tobacco: Never Used  Vaping Use  . Vaping Use: Never  used  Substance Use Topics  . Alcohol use: Never  . Drug use: Never     Colonoscopy:  PAP:  Bone density:  Lipid panel:  No Known Allergies  Current Outpatient Medications  Medication Sig Dispense Refill  . acetaminophen (TYLENOL) 325 MG tablet Take 650 mg by mouth every 6 (six) hours as needed.    Marland Kitchen HYDROcodone-acetaminophen (NORCO) 5-325 MG  tablet Take 1 tablet by mouth every 6 (six) hours as needed for moderate pain. 30 tablet 0  . lidocaine-prilocaine (EMLA) cream Apply 1 application topically as needed. Apply prior to appointment. Cover with plastic wrap. 30 g 2  . loratadine (CLARITIN) 10 MG tablet Take 10 mg by mouth daily.    . ondansetron (ZOFRAN) 4 MG tablet Take 1 tablet (4 mg total) by mouth every 8 (eight) hours as needed for nausea or vomiting. 30 tablet 2  . pantoprazole (PROTONIX) 40 MG tablet Take 1 tablet (40 mg total) by mouth 2 (two) times daily. 60 tablet 0   No current facility-administered medications for this visit.    OBJECTIVE: Vitals:   11/05/20 0855  BP: 101/69  Pulse: 76  Resp: 16  Temp: (!) 97.2 F (36.2 C)     Body mass index is 25.88 kg/m.    ECOG FS:0 - Asymptomatic  General: Well-developed, well-nourished, no acute distress. Eyes: Pink conjunctiva, anicteric sclera. HEENT: Normocephalic, moist mucous membranes. Lungs: No audible wheezing or coughing. Heart: Regular rate and rhythm. Abdomen: Soft, nontender, no obvious distention. Musculoskeletal: No edema, cyanosis, or clubbing. Neuro: Alert, answering all questions appropriately. Cranial nerves grossly intact. Skin: No rashes or petechiae noted. Psych: Normal affect.  LAB RESULTS:  Lab Results  Component Value Date   NA 139 11/05/2020   K 3.9 11/05/2020   CL 108 11/05/2020   CO2 24 11/05/2020   GLUCOSE 93 11/05/2020   BUN 14 11/05/2020   CREATININE 1.09 (H) 11/05/2020   CALCIUM 8.4 (L) 11/05/2020   PROT 6.6 11/05/2020   ALBUMIN 3.0 (L) 11/05/2020   AST 27 11/05/2020   ALT 14 11/05/2020   ALKPHOS 87 11/05/2020   BILITOT 0.2 (L) 11/05/2020   GFRNONAA >60 11/05/2020    Lab Results  Component Value Date   WBC 7.0 11/05/2020   NEUTROABS 4.1 11/05/2020   HGB 9.6 (L) 11/05/2020   HCT 30.3 (L) 11/05/2020   MCV 103.8 (H) 11/05/2020   PLT 103 (L) 11/05/2020     STUDIES: No results found.  ASSESSMENT: Metastatic  colorectal adenocarcinoma,  KRAS wild-type.  PLAN:   1. Metastatic colorectal adenocarcinoma: Although unclear whether malignancy originated in colon or rectum, given the pattern of spread to liver, will treat as a stage IV colon cancer.  CEA is only mildly elevated at 9.9.  Patient has had port placement. Initial plan was to treat with FOLFOX plus Avastin every 2 weeks.  Can consider Panitumumab as second line.  Avastin was held for the first 6 cycles secondary to persistent rectal bleeding, but this is now resolved.  CT scan results from September 03, 2020 reviewed independently with significant improvement of patient's disease burden.  Proceed with cycle 10 of FOLFOX plus Avastin today.  Return to clinic in 2 days for pump removal and Udenyca, then in 2 weeks for further evaluation and consideration of cycle 11.  Plan to reimage after cycle 12. Appreciate palliative care and genetics input.  2.  Anemia: Chronic and unchanged.  Patient's hemoglobin is 9.6 today. 3.  Renal insufficiency: Chronic and unchanged.  4.  Neutropenia: Resolved.  Udenyca as above. 5.  Nephrostomy tubes: Internalized on August 15, 2020.  Patient expressed understanding and was in agreement with this plan. She also understands that She can call clinic at any time with any questions, concerns, or complaints.   Cancer Staging Metastatic colorectal cancer [facility]) Staging form: Colon and Rectum, AJCC 8th Edition - Clinical stage from 05/31/2020: Stage IVC (cTX, cNX, pM1c) - Signed by [name], MD on 05/31/2020   [name], MD   11/06/2020 6:45 AM

## 2020-10-31 DIAGNOSIS — C19 Malignant neoplasm of rectosigmoid junction: Secondary | ICD-10-CM | POA: Diagnosis not present

## 2020-11-05 ENCOUNTER — Inpatient Hospital Stay: Payer: Medicaid Other

## 2020-11-05 ENCOUNTER — Encounter: Payer: Self-pay | Admitting: Oncology

## 2020-11-05 ENCOUNTER — Inpatient Hospital Stay (HOSPITAL_BASED_OUTPATIENT_CLINIC_OR_DEPARTMENT_OTHER): Payer: Medicaid Other | Admitting: Oncology

## 2020-11-05 ENCOUNTER — Inpatient Hospital Stay: Payer: Medicaid Other | Attending: Oncology

## 2020-11-05 VITALS — BP 101/69 | HR 76 | Temp 97.2°F | Resp 16 | Wt 132.5 lb

## 2020-11-05 DIAGNOSIS — C19 Malignant neoplasm of rectosigmoid junction: Secondary | ICD-10-CM

## 2020-11-05 DIAGNOSIS — N189 Chronic kidney disease, unspecified: Secondary | ICD-10-CM | POA: Insufficient documentation

## 2020-11-05 DIAGNOSIS — Z833 Family history of diabetes mellitus: Secondary | ICD-10-CM | POA: Insufficient documentation

## 2020-11-05 DIAGNOSIS — Z5189 Encounter for other specified aftercare: Secondary | ICD-10-CM | POA: Diagnosis not present

## 2020-11-05 DIAGNOSIS — Z823 Family history of stroke: Secondary | ICD-10-CM | POA: Insufficient documentation

## 2020-11-05 DIAGNOSIS — Z809 Family history of malignant neoplasm, unspecified: Secondary | ICD-10-CM | POA: Diagnosis not present

## 2020-11-05 DIAGNOSIS — G629 Polyneuropathy, unspecified: Secondary | ICD-10-CM | POA: Insufficient documentation

## 2020-11-05 DIAGNOSIS — D649 Anemia, unspecified: Secondary | ICD-10-CM | POA: Insufficient documentation

## 2020-11-05 DIAGNOSIS — Z8249 Family history of ischemic heart disease and other diseases of the circulatory system: Secondary | ICD-10-CM | POA: Insufficient documentation

## 2020-11-05 DIAGNOSIS — D696 Thrombocytopenia, unspecified: Secondary | ICD-10-CM | POA: Insufficient documentation

## 2020-11-05 DIAGNOSIS — Z5111 Encounter for antineoplastic chemotherapy: Secondary | ICD-10-CM | POA: Diagnosis present

## 2020-11-05 DIAGNOSIS — Z79899 Other long term (current) drug therapy: Secondary | ICD-10-CM | POA: Diagnosis not present

## 2020-11-05 DIAGNOSIS — Z5112 Encounter for antineoplastic immunotherapy: Secondary | ICD-10-CM | POA: Insufficient documentation

## 2020-11-05 DIAGNOSIS — Z8049 Family history of malignant neoplasm of other genital organs: Secondary | ICD-10-CM | POA: Diagnosis not present

## 2020-11-05 DIAGNOSIS — Z800 Family history of malignant neoplasm of digestive organs: Secondary | ICD-10-CM | POA: Insufficient documentation

## 2020-11-05 LAB — COMPREHENSIVE METABOLIC PANEL
ALT: 14 U/L (ref 0–44)
AST: 27 U/L (ref 15–41)
Albumin: 3 g/dL — ABNORMAL LOW (ref 3.5–5.0)
Alkaline Phosphatase: 87 U/L (ref 38–126)
Anion gap: 7 (ref 5–15)
BUN: 14 mg/dL (ref 6–20)
CO2: 24 mmol/L (ref 22–32)
Calcium: 8.4 mg/dL — ABNORMAL LOW (ref 8.9–10.3)
Chloride: 108 mmol/L (ref 98–111)
Creatinine, Ser: 1.09 mg/dL — ABNORMAL HIGH (ref 0.44–1.00)
GFR, Estimated: >60 mL/min (ref >60)
Glucose, Bld: 93 mg/dL (ref 70–99)
Potassium: 3.9 mmol/L (ref 3.5–5.1)
Sodium: 139 mmol/L (ref 135–145)
Total Bilirubin: 0.2 mg/dL — ABNORMAL LOW (ref 0.3–1.2)
Total Protein: 6.6 g/dL (ref 6.5–8.1)

## 2020-11-05 LAB — URINALYSIS, DIPSTICK ONLY
Bilirubin Urine: NEGATIVE
Glucose, UA: NEGATIVE mg/dL
Ketones, ur: NEGATIVE mg/dL
Nitrite: POSITIVE — AB
Protein, ur: 30 mg/dL — AB
Specific Gravity, Urine: 1.006 (ref 1.005–1.030)
pH: 7 (ref 5.0–8.0)

## 2020-11-05 LAB — CBC WITH DIFFERENTIAL/PLATELET
Abs Immature Granulocytes: 0.04 10*3/uL (ref 0.00–0.07)
Basophils Absolute: 0.1 10*3/uL (ref 0.0–0.1)
Basophils Relative: 1 %
Eosinophils Absolute: 0.1 10*3/uL (ref 0.0–0.5)
Eosinophils Relative: 1 %
HCT: 30.3 % — ABNORMAL LOW (ref 36.0–46.0)
Hemoglobin: 9.6 g/dL — ABNORMAL LOW (ref 12.0–15.0)
Immature Granulocytes: 1 %
Lymphocytes Relative: 29 %
Lymphs Abs: 2 10*3/uL (ref 0.7–4.0)
MCH: 32.9 pg (ref 26.0–34.0)
MCHC: 31.7 g/dL (ref 30.0–36.0)
MCV: 103.8 fL — ABNORMAL HIGH (ref 80.0–100.0)
Monocytes Absolute: 0.7 10*3/uL (ref 0.1–1.0)
Monocytes Relative: 10 %
Neutro Abs: 4.1 10*3/uL (ref 1.7–7.7)
Neutrophils Relative %: 58 %
Platelets: 103 10*3/uL — ABNORMAL LOW (ref 150–400)
RBC: 2.92 MIL/uL — ABNORMAL LOW (ref 3.87–5.11)
RDW: 16.2 % — ABNORMAL HIGH (ref 11.5–15.5)
WBC: 7 10*3/uL (ref 4.0–10.5)
nRBC: 0 % (ref 0.0–0.2)

## 2020-11-05 MED ORDER — SODIUM CHLORIDE 0.9 % IV SOLN
2400.0000 mg/m2 | INTRAVENOUS | Status: DC
  Administered 2020-11-05: 3750 mg via INTRAVENOUS
  Filled 2020-11-05: qty 75

## 2020-11-05 MED ORDER — HEPARIN SOD (PORK) LOCK FLUSH 100 UNIT/ML IV SOLN
500.0000 [IU] | Freq: Once | INTRAVENOUS | Status: DC
  Filled 2020-11-05: qty 5

## 2020-11-05 MED ORDER — FLUOROURACIL CHEMO INJECTION 2.5 GM/50ML
400.0000 mg/m2 | Freq: Once | INTRAVENOUS | Status: AC
  Administered 2020-11-05: 650 mg via INTRAVENOUS
  Filled 2020-11-05: qty 13

## 2020-11-05 MED ORDER — LIDOCAINE-PRILOCAINE 2.5-2.5 % EX CREA: 1.0000 "application " | TOPICAL_CREAM | CUTANEOUS | 2 refills | Status: DC | PRN

## 2020-11-05 MED ORDER — OXALIPLATIN CHEMO INJECTION 100 MG/20ML
85.0000 mg/m2 | Freq: Once | INTRAVENOUS | Status: AC
  Administered 2020-11-05: 135 mg via INTRAVENOUS
  Filled 2020-11-05: qty 20

## 2020-11-05 MED ORDER — PANTOPRAZOLE SODIUM 40 MG PO TBEC: 40.0000 mg | DELAYED_RELEASE_TABLET | Freq: Two times a day (BID) | ORAL | 0 refills | Status: DC

## 2020-11-05 MED ORDER — LEUCOVORIN CALCIUM INJECTION 350 MG
414.0000 mg/m2 | Freq: Once | INTRAVENOUS | Status: AC
  Administered 2020-11-05: 650 mg via INTRAVENOUS
  Filled 2020-11-05: qty 32.5

## 2020-11-05 MED ORDER — DEXTROSE 5 % IV SOLN
Freq: Once | INTRAVENOUS | Status: AC
Start: 2020-11-05 — End: 2020-11-05
  Filled 2020-11-05: qty 250

## 2020-11-05 MED ORDER — SODIUM CHLORIDE 0.9 % IV SOLN
10.0000 mg/kg | Freq: Once | INTRAVENOUS | Status: AC
  Administered 2020-11-05: 600 mg via INTRAVENOUS
  Filled 2020-11-05: qty 16

## 2020-11-05 MED ORDER — ONDANSETRON HCL 4 MG PO TABS: 4.0000 mg | ORAL_TABLET | Freq: Three times a day (TID) | ORAL | 2 refills | Status: DC | PRN

## 2020-11-05 MED ORDER — DEXAMETHASONE SODIUM PHOSPHATE 100 MG/10ML IJ SOLN
10.0000 mg | Freq: Once | INTRAMUSCULAR | Status: AC
  Administered 2020-11-05: 10 mg via INTRAVENOUS
  Filled 2020-11-05: qty 10

## 2020-11-05 MED ORDER — SODIUM CHLORIDE 0.9% FLUSH
10.0000 mL | Freq: Once | INTRAVENOUS | Status: AC
  Administered 2020-11-05: 10 mL via INTRAVENOUS
  Filled 2020-11-05: qty 10

## 2020-11-05 MED ORDER — PALONOSETRON HCL INJECTION 0.25 MG/5ML
0.2500 mg | Freq: Once | INTRAVENOUS | Status: AC
  Administered 2020-11-05: 0.25 mg via INTRAVENOUS
  Filled 2020-11-05: qty 5

## 2020-11-05 MED ORDER — SODIUM CHLORIDE 0.9 % IV SOLN
Freq: Once | INTRAVENOUS | Status: AC
  Filled 2020-11-05: qty 250

## 2020-11-07 ENCOUNTER — Other Ambulatory Visit: Payer: Self-pay

## 2020-11-07 ENCOUNTER — Inpatient Hospital Stay: Payer: Medicaid Other

## 2020-11-07 VITALS — BP 91/65 | HR 84 | Temp 95.4°F | Resp 20

## 2020-11-07 DIAGNOSIS — C19 Malignant neoplasm of rectosigmoid junction: Secondary | ICD-10-CM

## 2020-11-07 DIAGNOSIS — Z5112 Encounter for antineoplastic immunotherapy: Secondary | ICD-10-CM | POA: Diagnosis not present

## 2020-11-07 MED ORDER — HEPARIN SOD (PORK) LOCK FLUSH 100 UNIT/ML IV SOLN
500.0000 [IU] | Freq: Once | INTRAVENOUS | Status: AC | PRN
  Administered 2020-11-07: 500 [IU]
  Filled 2020-11-07: qty 5

## 2020-11-07 MED ORDER — HEPARIN SOD (PORK) LOCK FLUSH 100 UNIT/ML IV SOLN
INTRAVENOUS | Status: AC
  Filled 2020-11-07: qty 5

## 2020-11-07 MED ORDER — SODIUM CHLORIDE 0.9% FLUSH
10.0000 mL | INTRAVENOUS | Status: DC | PRN
  Administered 2020-11-07: 10 mL
  Filled 2020-11-07: qty 10

## 2020-11-07 MED ORDER — PEGFILGRASTIM-CBQV 6 MG/0.6ML ~~LOC~~ SOSY
6.0000 mg | PREFILLED_SYRINGE | Freq: Once | SUBCUTANEOUS | Status: AC
  Administered 2020-11-07: 6 mg via SUBCUTANEOUS
  Filled 2020-11-07: qty 0.6

## 2020-11-16 NOTE — Progress Notes (Signed)
[place]  Telephone:([phone] Fax:(336) [phone]  ID: Anita Sullivan OB: 04/26/1966  MR#: [id number]  KCL#:[id number]  Patient Care Team: [name], NP as PCP - General (Nurse Practitioner) [name], MD as Consulting Physician (Oncology) [name], RN as Oncology Nurse Navigator  CHIEF COMPLAINT: Metastatic colorectal adenocarcinoma, KRAS wild-type  INTERVAL HISTORY: Patient returns to clinic today for further evaluation and consideration of cycle 11 of FOLFOX plus Avastin.  She continues to feel well.  She reports a nonpainful peripheral neuropathy in her fingertips and feet that does not affect her day-to-day activity.  She has no other neurologic complaints.  She denies any weakness or fatigue. She no longer has rectal bleeding. She has a good appetite and denies weight loss.  She denies any chest pain, shortness of breath, cough, or hemoptysis.  She denies any nausea, vomiting, constipation, or diarrhea.  She has no urinary complaints.  Patient offers no further specific complaints today.  REVIEW OF SYSTEMS:   Review of Systems  Constitutional: Negative.  Negative for fever, malaise/fatigue and weight loss.  HENT: Negative.  Negative for congestion.   Respiratory: Negative.  Negative for cough, hemoptysis and shortness of breath.   Cardiovascular: Negative.  Negative for chest pain and leg swelling.  Gastrointestinal: Negative for blood in stool, constipation, nausea and vomiting.  Genitourinary: Negative.  Negative for flank pain and hematuria.  Musculoskeletal: Negative.  Negative for back pain.  Skin: Negative.  Negative for rash.  Neurological: Positive for tingling and sensory change. Negative for dizziness, seizures, weakness and headaches.  Psychiatric/Behavioral: Negative.  The patient is not nervous/anxious.     As per HPI. Otherwise, a complete review of systems is negative.  PAST MEDICAL HISTORY: Past Medical  History:  Diagnosis Date  . Colon cancer ([place])   . Family history of pancreatic cancer   . Family history of uterine cancer     PAST SURGICAL HISTORY: Past Surgical History:  Procedure Laterality Date  . COLONOSCOPY WITH PROPOFOL N/A 05/27/2020   Procedure: COLONOSCOPY WITH PROPOFOL;  Surgeon: [name], MD;  Location: [facility] ENDOSCOPY;  Service: Gastroenterology;  Laterality: N/A;  . FLEXIBLE SIGMOIDOSCOPY N/A 05/28/2020   Procedure: FLEXIBLE SIGMOIDOSCOPY;  Surgeon: [name], MD;  Location: [facility] ENDOSCOPY;  Service: Gastroenterology;  Laterality: N/A;  . IR NEPHROSTOMY EXCHANGE LEFT  07/11/2020  . IR NEPHROSTOMY EXCHANGE RIGHT  07/11/2020  . IR NEPHROSTOMY PLACEMENT LEFT  05/23/2020  . IR NEPHROSTOMY PLACEMENT RIGHT  05/23/2020  . IR URETERAL STENT LEFT NEW ACCESS W/O SEP NEPHROSTOMY CATH  08/15/2020  . IR URETERAL STENT RIGHT NEW ACCESS W/O SEP NEPHROSTOMY CATH  08/15/2020  . PORTACATH PLACEMENT Left 05/29/2020   Procedure: INSERTION PORT-A-CATH;  Surgeon: [name], MD;  Location: [facility] ORS;  Service: General;  Laterality: Left;    FAMILY HISTORY: Family History  Problem Relation Age of Onset  . Other Mother        Corticobasal degeneration  . Uterine cancer Mother   . Diabetes Maternal Grandmother   . Stroke Maternal Grandfather   . Stroke Paternal Grandfather   . Heart disease Father   . Pancreatic cancer Maternal Uncle 65  . Cancer Paternal Aunt        unk type  . Liver cancer Maternal Uncle   . Pancreatic cancer Cousin   . Cancer Cousin        unk type    ADVANCED DIRECTIVES (Y/N):  N  HEALTH MAINTENANCE: Social History  Tobacco Use  . Smoking status: Never Smoker  . Smokeless tobacco: Never Used  Vaping Use  . Vaping Use: Never used  Substance Use Topics  . Alcohol use: Never  . Drug use: Never     Colonoscopy:  PAP:  Bone density:  Lipid panel:  No Known Allergies  Current Outpatient Medications  Medication Sig Dispense  Refill  . acetaminophen (TYLENOL) 325 MG tablet Take 650 mg by mouth every 6 (six) hours as needed.    Marland Kitchen HYDROcodone-acetaminophen (NORCO) 5-325 MG tablet Take 1 tablet by mouth every 6 (six) hours as needed for moderate pain. 30 tablet 0  . lidocaine-prilocaine (EMLA) cream Apply 1 application topically as needed. Apply prior to appointment. Cover with plastic wrap. 30 g 2  . loratadine (CLARITIN) 10 MG tablet Take 10 mg by mouth daily.    . ondansetron (ZOFRAN) 4 MG tablet Take 1 tablet (4 mg total) by mouth every 8 (eight) hours as needed for nausea or vomiting. 30 tablet 2  . pantoprazole (PROTONIX) 40 MG tablet Take 1 tablet (40 mg total) by mouth 2 (two) times daily. 60 tablet 0   No current facility-administered medications for this visit.    OBJECTIVE: Vitals:   11/19/20 0853  BP: 106/78  Pulse: 82  Resp: 18  Temp: (!) 96 F (35.6 C)  SpO2: 100%     Body mass index is 26.23 kg/m.    ECOG FS:0 - Asymptomatic  General: Well-developed, well-nourished, no acute distress. Eyes: Pink conjunctiva, anicteric sclera. HEENT: Normocephalic, moist mucous membranes. Lungs: No audible wheezing or coughing. Heart: Regular rate and rhythm. Abdomen: Soft, nontender, no obvious distention. Musculoskeletal: No edema, cyanosis, or clubbing. Neuro: Alert, answering all questions appropriately. Cranial nerves grossly intact. Skin: No rashes or petechiae noted. Psych: Normal affect.  LAB RESULTS:  Lab Results  Component Value Date   NA 139 11/19/2020   K 3.7 11/19/2020   CL 109 11/19/2020   CO2 22 11/19/2020   GLUCOSE 106 (H) 11/19/2020   BUN 17 11/19/2020   CREATININE 1.34 (H) 11/19/2020   CALCIUM 8.4 (L) 11/19/2020   PROT 6.7 11/19/2020   ALBUMIN 3.2 (L) 11/19/2020   AST 30 11/19/2020   ALT 16 11/19/2020   ALKPHOS 105 11/19/2020   BILITOT 0.3 11/19/2020   GFRNONAA 48 (L) 11/19/2020    Lab Results  Component Value Date   WBC 5.3 11/19/2020   NEUTROABS 2.7 11/19/2020   HGB  9.3 (L) 11/19/2020   HCT 28.9 (L) 11/19/2020   MCV 105.5 (H) 11/19/2020   PLT 78 (L) 11/19/2020     STUDIES: No results found.  ASSESSMENT: Metastatic colorectal adenocarcinoma,  KRAS wild-type.  PLAN:   1. Metastatic colorectal adenocarcinoma: Although unclear whether malignancy originated in colon or rectum, given the pattern of spread to liver, will treat as a stage IV colon cancer.  CEA is only mildly elevated at 9.9.  Patient has had port placement. Initial plan was to treat with FOLFOX plus Avastin every 2 weeks.  Can consider Panitumumab as second line.  Avastin was held for the first 6 cycles secondary to persistent rectal bleeding, but this is now resolved.  CT scan results from September 03, 2020 reviewed independently with significant improvement of patient's disease burden.  Delay cycle 11 of FOLFOX plus Avastin secondary to thrombocytopenia.  Return to clinic in 1 week for further evaluation and reconsideration of treatment. Plan to reimage after cycle 12. Appreciate palliative care and genetics input.   2.  Anemia: Hemoglobin has trended down slightly to 9.3, monitor. 3.  Renal insufficiency: Creatinine is trended up slightly to 1.34, monitor. 4.  Neutropenia: Resolved.  Udenyca with pump removal as above. 5.  Nephrostomy tubes: Internalized on August 15, 2020. 6.  Thrombocytopenia: Delay treatment as above. 7.  Peripheral neuropathy: Delay treatment as above.  Consider dose reduction of oxaliplatin for final 2 cycles.  Patient expressed understanding and was in agreement with this plan. She also understands that She can call clinic at any time with any questions, concerns, or complaints.   Cancer Staging Metastatic colorectal cancer [facility]) Staging form: Colon and Rectum, AJCC 8th Edition - Clinical stage from 05/31/2020: Stage IVC (cTX, cNX, pM1c) - Signed by [name], MD on 05/31/2020   [name], MD   11/19/2020 4:00 PM

## 2020-11-19 ENCOUNTER — Inpatient Hospital Stay: Payer: Medicaid Other

## 2020-11-19 ENCOUNTER — Inpatient Hospital Stay (HOSPITAL_BASED_OUTPATIENT_CLINIC_OR_DEPARTMENT_OTHER): Payer: Medicaid Other | Admitting: Oncology

## 2020-11-19 ENCOUNTER — Other Ambulatory Visit: Payer: Self-pay

## 2020-11-19 VITALS — BP 106/78 | HR 82 | Temp 96.0°F | Resp 18 | Wt 134.3 lb

## 2020-11-19 DIAGNOSIS — Z5112 Encounter for antineoplastic immunotherapy: Secondary | ICD-10-CM | POA: Diagnosis not present

## 2020-11-19 DIAGNOSIS — C19 Malignant neoplasm of rectosigmoid junction: Secondary | ICD-10-CM | POA: Diagnosis not present

## 2020-11-19 LAB — CBC WITH DIFFERENTIAL/PLATELET
Abs Immature Granulocytes: 0.03 10*3/uL (ref 0.00–0.07)
Basophils Absolute: 0 10*3/uL (ref 0.0–0.1)
Basophils Relative: 1 %
Eosinophils Absolute: 0.1 10*3/uL (ref 0.0–0.5)
Eosinophils Relative: 1 %
HCT: 28.9 % — ABNORMAL LOW (ref 36.0–46.0)
Hemoglobin: 9.3 g/dL — ABNORMAL LOW (ref 12.0–15.0)
Immature Granulocytes: 1 %
Lymphocytes Relative: 33 %
Lymphs Abs: 1.8 10*3/uL (ref 0.7–4.0)
MCH: 33.9 pg (ref 26.0–34.0)
MCHC: 32.2 g/dL (ref 30.0–36.0)
MCV: 105.5 fL — ABNORMAL HIGH (ref 80.0–100.0)
Monocytes Absolute: 0.7 10*3/uL (ref 0.1–1.0)
Monocytes Relative: 13 %
Neutro Abs: 2.7 10*3/uL (ref 1.7–7.7)
Neutrophils Relative %: 51 %
Platelets: 78 10*3/uL — ABNORMAL LOW (ref 150–400)
RBC: 2.74 MIL/uL — ABNORMAL LOW (ref 3.87–5.11)
RDW: 17.3 % — ABNORMAL HIGH (ref 11.5–15.5)
WBC: 5.3 10*3/uL (ref 4.0–10.5)
nRBC: 0 % (ref 0.0–0.2)

## 2020-11-19 LAB — COMPREHENSIVE METABOLIC PANEL
ALT: 16 U/L (ref 0–44)
AST: 30 U/L (ref 15–41)
Albumin: 3.2 g/dL — ABNORMAL LOW (ref 3.5–5.0)
Alkaline Phosphatase: 105 U/L (ref 38–126)
Anion gap: 8 (ref 5–15)
BUN: 17 mg/dL (ref 6–20)
CO2: 22 mmol/L (ref 22–32)
Calcium: 8.4 mg/dL — ABNORMAL LOW (ref 8.9–10.3)
Chloride: 109 mmol/L (ref 98–111)
Creatinine, Ser: 1.34 mg/dL — ABNORMAL HIGH (ref 0.44–1.00)
GFR, Estimated: 48 mL/min — ABNORMAL LOW (ref >60)
Glucose, Bld: 106 mg/dL — ABNORMAL HIGH (ref 70–99)
Potassium: 3.7 mmol/L (ref 3.5–5.1)
Sodium: 139 mmol/L (ref 135–145)
Total Bilirubin: 0.3 mg/dL (ref 0.3–1.2)
Total Protein: 6.7 g/dL (ref 6.5–8.1)

## 2020-11-19 LAB — URINALYSIS, DIPSTICK ONLY
Bilirubin Urine: NEGATIVE
Glucose, UA: NEGATIVE mg/dL
Ketones, ur: NEGATIVE mg/dL
Nitrite: POSITIVE — AB
Protein, ur: 30 mg/dL — AB
Specific Gravity, Urine: 1.009 (ref 1.005–1.030)
pH: 7 (ref 5.0–8.0)

## 2020-11-19 MED ORDER — SODIUM CHLORIDE 0.9% FLUSH
10.0000 mL | Freq: Once | INTRAVENOUS | Status: AC
  Administered 2020-11-19: 10 mL via INTRAVENOUS
  Filled 2020-11-19: qty 10

## 2020-11-19 MED ORDER — HEPARIN SOD (PORK) LOCK FLUSH 100 UNIT/ML IV SOLN
500.0000 [IU] | Freq: Once | INTRAVENOUS | Status: AC
  Administered 2020-11-19: 500 [IU] via INTRAVENOUS
  Filled 2020-11-19: qty 5

## 2020-11-19 NOTE — Progress Notes (Signed)
Pt experiencing some mild dyspnea with exertion only. It goes away with rest. Having increasing peripheral neuropathy. Here for chemo today.

## 2020-11-21 ENCOUNTER — Inpatient Hospital Stay: Payer: Medicaid Other

## 2020-11-21 NOTE — Progress Notes (Signed)
[place]  [phone] [phone]) [phone]  ID: Anita Sullivan OB: 12-08-1965  MR#: [id number]  JZP#:[id number]  Patient Care Team: [name], NP as PCP - General (Nurse Practitioner) [name], MD as Consulting Physician (Oncology) [name], RN as Oncology Nurse Navigator  CHIEF COMPLAINT: Metastatic colorectal adenocarcinoma, KRAS wild-type  INTERVAL HISTORY: Patient returns to clinic today for further evaluation and reconsideration of cycle 11 of FOLFOX plus Avastin.  She continues to feel well.  She is tolerating her treatments without significant side effects.  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  She has no other neurologic complaints.  She denies any weakness or fatigue. She no longer has rectal bleeding. She has a good appetite and denies weight loss.  She denies any chest pain, shortness of breath, cough, or hemoptysis.  She denies any nausea, vomiting, constipation, or diarrhea.  She has no urinary complaints.  Patient offers no further specific complaints today.    REVIEW OF SYSTEMS:   Review of Systems  Constitutional: Negative.  Negative for fever, malaise/fatigue and weight loss.  HENT: Negative.  Negative for congestion.   Respiratory: Negative.  Negative for cough, hemoptysis and shortness of breath.   Cardiovascular: Negative.  Negative for chest pain and leg swelling.  Gastrointestinal: Negative for blood in stool, constipation, nausea and vomiting.  Genitourinary: Negative.  Negative for flank pain and hematuria.  Musculoskeletal: Negative.  Negative for back pain.  Skin: Negative.  Negative for rash.  Neurological: Positive for tingling and sensory change. Negative for dizziness, seizures, weakness and headaches.  Psychiatric/Behavioral: Negative.  The patient is not nervous/anxious.     As per HPI.  Otherwise, a complete review of systems is negative.  PAST MEDICAL HISTORY: Past Medical History:  Diagnosis Date  . Colon cancer ([place])   . Family history of pancreatic cancer   . Family history of uterine cancer     PAST SURGICAL HISTORY: Past Surgical History:  Procedure Laterality Date  . COLONOSCOPY WITH PROPOFOL N/A 05/27/2020   Procedure: COLONOSCOPY WITH PROPOFOL;  Surgeon: [name], MD;  Location: [facility] ENDOSCOPY;  Service: Gastroenterology;  Laterality: N/A;  . FLEXIBLE SIGMOIDOSCOPY N/A 05/28/2020   Procedure: FLEXIBLE SIGMOIDOSCOPY;  Surgeon: [name], MD;  Location: [facility] ENDOSCOPY;  Service: Gastroenterology;  Laterality: N/A;  . IR NEPHROSTOMY EXCHANGE LEFT  07/11/2020  . IR NEPHROSTOMY EXCHANGE RIGHT  07/11/2020  . IR NEPHROSTOMY PLACEMENT LEFT  05/23/2020  . IR NEPHROSTOMY PLACEMENT RIGHT  05/23/2020  . IR URETERAL STENT LEFT NEW ACCESS W/O SEP NEPHROSTOMY CATH  08/15/2020  . IR URETERAL STENT RIGHT NEW ACCESS W/O SEP NEPHROSTOMY CATH  08/15/2020  . PORTACATH PLACEMENT Left 05/29/2020   Procedure: INSERTION PORT-A-CATH;  Surgeon: [name], MD;  Location: [facility] ORS;  Service: General;  Laterality: Left;    FAMILY HISTORY: Family History  Problem Relation Age of Onset  . Other Mother        Corticobasal degeneration  . Uterine cancer Mother   . Diabetes Maternal Grandmother   . Stroke Maternal Grandfather   . Stroke Paternal Grandfather   . Heart disease Father   . Pancreatic cancer Maternal Uncle 32  . Cancer Paternal Aunt        unk type  . Liver cancer Maternal Uncle   . Pancreatic cancer Cousin   . Cancer Cousin        unk type  ADVANCED DIRECTIVES (Y/N):  N  HEALTH MAINTENANCE: Social History   Tobacco Use  . Smoking status: Never Smoker  . Smokeless tobacco: Never Used  Vaping Use  . Vaping Use: Never used  Substance Use Topics  . Alcohol use: Never  . Drug use: Never     Colonoscopy:  PAP:  Bone  density:  Lipid panel:  No Known Allergies  Current Outpatient Medications  Medication Sig Dispense Refill  . acetaminophen (TYLENOL) 325 MG tablet Take 650 mg by mouth every 6 (six) hours as needed.    Marland Kitchen HYDROcodone-acetaminophen (NORCO) 5-325 MG tablet Take 1 tablet by mouth every 6 (six) hours as needed for moderate pain. 30 tablet 0  . lidocaine-prilocaine (EMLA) cream Apply 1 application topically as needed. Apply prior to appointment. Cover with plastic wrap. 30 g 2  . loratadine (CLARITIN) 10 MG tablet Take 10 mg by mouth daily.    . ondansetron (ZOFRAN) 4 MG tablet Take 1 tablet (4 mg total) by mouth every 8 (eight) hours as needed for nausea or vomiting. 30 tablet 2  . pantoprazole (PROTONIX) 40 MG tablet Take 1 tablet (40 mg total) by mouth 2 (two) times daily. 60 tablet 0   No current facility-administered medications for this visit.   Facility-Administered Medications Ordered in Other Visits  Medication Dose Route Frequency Provider Last Rate Last Admin  . bevacizumab-bvzr (ZIRABEV) 600 mg in sodium chloride 0.9 % 100 mL chemo infusion  10 mg/kg (Treatment Plan Recorded) Intravenous Once [name], MD      . dexamethasone (DECADRON) 10 mg in sodium chloride 0.9 % 50 mL IVPB  10 mg Intravenous Once [name], MD 204 mL/hr at 11/26/20 1004 10 mg at 11/26/20 1004  . dextrose 5 % solution   Intravenous Once [name], MD      . fluorouracil (ADRUCIL) 3,750 mg in sodium chloride 0.9 % 75 mL chemo infusion  2,400 mg/m2 (Treatment Plan Recorded) Intravenous 1 day or 1 dose [name], MD      . fluorouracil (ADRUCIL) chemo injection 650 mg  400 mg/m2 (Treatment Plan Recorded) Intravenous Once [name], MD      . heparin lock flush 100 unit/mL  500 Units Intracatheter Once PRN [name], MD      . leucovorin 650 mg in dextrose 5 % 250 mL infusion  414 mg/m2 (Treatment Plan Recorded) Intravenous Once [name], MD       . oxaliplatin (ELOXATIN) 135 mg in dextrose 5 % 500 mL chemo infusion  85 mg/m2 (Treatment Plan Recorded) Intravenous Once [name], MD      . sodium chloride flush (NS) 0.9 % injection 10 mL  10 mL Intravenous PRN [name], MD   10 mL at 11/26/20 0817  . sodium chloride flush (NS) 0.9 % injection 10 mL  10 mL Intracatheter PRN [name], MD        OBJECTIVE: Vitals:   11/26/20 0832  BP: 99/73  Pulse: 82  Resp: 17  Temp: (!) 94.9 F (34.9 C)  SpO2: 100%     Body mass index is 26.17 kg/m.    ECOG FS:0 - Asymptomatic  General: Well-developed, well-nourished, no acute distress. Eyes: Pink conjunctiva, anicteric sclera. HEENT: Normocephalic, moist mucous membranes. Lungs: No audible wheezing or coughing. Heart: Regular rate and rhythm. Abdomen: Soft, nontender, no obvious distention. Musculoskeletal: No edema, cyanosis, or clubbing. Neuro: Alert, answering all questions appropriately. Cranial nerves grossly  intact. Skin: No rashes or petechiae noted. Psych: Normal affect.  LAB RESULTS:  Lab Results  Component Value Date   NA 138 11/26/2020   K 3.9 11/26/2020   CL 106 11/26/2020   CO2 24 11/26/2020   GLUCOSE 94 11/26/2020   BUN 17 11/26/2020   CREATININE 1.22 (H) 11/26/2020   CALCIUM 8.8 (L) 11/26/2020   PROT 6.9 11/26/2020   ALBUMIN 3.2 (L) 11/26/2020   AST 45 (H) 11/26/2020   ALT 30 11/26/2020   ALKPHOS 118 11/26/2020   BILITOT 0.4 11/26/2020   GFRNONAA 53 (L) 11/26/2020    Lab Results  Component Value Date   WBC 7.5 11/26/2020   NEUTROABS 4.8 11/26/2020   HGB 9.9 (L) 11/26/2020   HCT 31.2 (L) 11/26/2020   MCV 106.1 (H) 11/26/2020   PLT 128 (L) 11/26/2020     STUDIES: No results found.  ASSESSMENT: Metastatic colorectal adenocarcinoma,  KRAS wild-type.  PLAN:   1. Metastatic colorectal adenocarcinoma: Although unclear whether malignancy originated in colon or rectum, given the pattern of spread to liver, will treat as a  stage IV colon cancer.  CEA was only mildly elevated at 9.9.  Patient has had port placement. Initial plan was to treat with FOLFOX plus Avastin every 2 weeks.  Can consider Panitumumab as second line.  Avastin was held for the first 6 cycles secondary to persistent rectal bleeding, but this is now resolved.  CT scan results from September 03, 2020 reviewed independently with significant improvement of patient's disease burden.  Proceed with cycle 11 of FOLFOX plus Avastin today.  Return to clinic in 2 days for pump removal and Udenyca and then in 2 weeks for further evaluation and consideration of cycle 12.  Plan to reimage after cycle 12. Appreciate palliative care and genetics input.   2.  Anemia: Hemoglobin mildly improved to 9.9, monitor. 3.  Renal insufficiency: Resolved.  Creatinine 1.22 today. 4.  Neutropenia: Resolved.  Udenyca with pump removal as above. 5.  Nephrostomy tubes: Internalized on August 15, 2020. 6.  Thrombocytopenia: Improved.  Proceed with treatment as above. 7.  Peripheral neuropathy: Unchanged.  Monitor.  Patient expressed understanding and was in agreement with this plan. She also understands that She can call clinic at any time with any questions, concerns, or complaints.   Cancer Staging Metastatic colorectal cancer [facility]) Staging form: Colon and Rectum, AJCC 8th Edition - Clinical stage from 05/31/2020: Stage IVC (cTX, cNX, pM1c) - Signed by [name], MD on 05/31/2020   [name], MD   11/26/2020 10:05 AM

## 2020-11-26 ENCOUNTER — Inpatient Hospital Stay (HOSPITAL_BASED_OUTPATIENT_CLINIC_OR_DEPARTMENT_OTHER): Payer: Medicaid Other | Admitting: Oncology

## 2020-11-26 ENCOUNTER — Inpatient Hospital Stay: Payer: Medicaid Other

## 2020-11-26 ENCOUNTER — Other Ambulatory Visit: Payer: Self-pay

## 2020-11-26 ENCOUNTER — Encounter: Payer: Self-pay | Admitting: Oncology

## 2020-11-26 VITALS — BP 99/73 | HR 82 | Temp 94.9°F | Resp 17 | Ht 60.0 in | Wt 134.0 lb

## 2020-11-26 DIAGNOSIS — C19 Malignant neoplasm of rectosigmoid junction: Secondary | ICD-10-CM

## 2020-11-26 DIAGNOSIS — Z5112 Encounter for antineoplastic immunotherapy: Secondary | ICD-10-CM | POA: Diagnosis not present

## 2020-11-26 LAB — CBC WITH DIFFERENTIAL/PLATELET
Abs Immature Granulocytes: 0.04 10*3/uL (ref 0.00–0.07)
Basophils Absolute: 0 10*3/uL (ref 0.0–0.1)
Basophils Relative: 0 %
Eosinophils Absolute: 0.1 10*3/uL (ref 0.0–0.5)
Eosinophils Relative: 1 %
HCT: 31.2 % — ABNORMAL LOW (ref 36.0–46.0)
Hemoglobin: 9.9 g/dL — ABNORMAL LOW (ref 12.0–15.0)
Immature Granulocytes: 1 %
Lymphocytes Relative: 25 %
Lymphs Abs: 1.9 10*3/uL (ref 0.7–4.0)
MCH: 33.7 pg (ref 26.0–34.0)
MCHC: 31.7 g/dL (ref 30.0–36.0)
MCV: 106.1 fL — ABNORMAL HIGH (ref 80.0–100.0)
Monocytes Absolute: 0.7 10*3/uL (ref 0.1–1.0)
Monocytes Relative: 9 %
Neutro Abs: 4.8 10*3/uL (ref 1.7–7.7)
Neutrophils Relative %: 64 %
Platelets: 128 10*3/uL — ABNORMAL LOW (ref 150–400)
RBC: 2.94 MIL/uL — ABNORMAL LOW (ref 3.87–5.11)
RDW: 18 % — ABNORMAL HIGH (ref 11.5–15.5)
WBC: 7.5 10*3/uL (ref 4.0–10.5)
nRBC: 0 % (ref 0.0–0.2)

## 2020-11-26 LAB — URINALYSIS, DIPSTICK ONLY
Bilirubin Urine: NEGATIVE
Glucose, UA: NEGATIVE mg/dL
Ketones, ur: NEGATIVE mg/dL
Nitrite: POSITIVE — AB
Protein, ur: NEGATIVE mg/dL
Specific Gravity, Urine: 1.004 — ABNORMAL LOW (ref 1.005–1.030)
pH: 8 (ref 5.0–8.0)

## 2020-11-26 LAB — COMPREHENSIVE METABOLIC PANEL
ALT: 30 U/L (ref 0–44)
AST: 45 U/L — ABNORMAL HIGH (ref 15–41)
Albumin: 3.2 g/dL — ABNORMAL LOW (ref 3.5–5.0)
Alkaline Phosphatase: 118 U/L (ref 38–126)
Anion gap: 8 (ref 5–15)
BUN: 17 mg/dL (ref 6–20)
CO2: 24 mmol/L (ref 22–32)
Calcium: 8.8 mg/dL — ABNORMAL LOW (ref 8.9–10.3)
Chloride: 106 mmol/L (ref 98–111)
Creatinine, Ser: 1.22 mg/dL — ABNORMAL HIGH (ref 0.44–1.00)
GFR, Estimated: 53 mL/min — ABNORMAL LOW (ref >60)
Glucose, Bld: 94 mg/dL (ref 70–99)
Potassium: 3.9 mmol/L (ref 3.5–5.1)
Sodium: 138 mmol/L (ref 135–145)
Total Bilirubin: 0.4 mg/dL (ref 0.3–1.2)
Total Protein: 6.9 g/dL (ref 6.5–8.1)

## 2020-11-26 MED ORDER — HEPARIN SOD (PORK) LOCK FLUSH 100 UNIT/ML IV SOLN
500.0000 [IU] | Freq: Once | INTRAVENOUS | Status: DC | PRN
  Filled 2020-11-26: qty 5

## 2020-11-26 MED ORDER — SODIUM CHLORIDE 0.9% FLUSH
10.0000 mL | INTRAVENOUS | Status: DC | PRN
  Administered 2020-11-26: 10 mL via INTRAVENOUS
  Filled 2020-11-26: qty 10

## 2020-11-26 MED ORDER — LEUCOVORIN CALCIUM INJECTION 350 MG
414.0000 mg/m2 | Freq: Once | INTRAVENOUS | Status: AC
  Administered 2020-11-26: 650 mg via INTRAVENOUS
  Filled 2020-11-26: qty 25

## 2020-11-26 MED ORDER — PALONOSETRON HCL INJECTION 0.25 MG/5ML
0.2500 mg | Freq: Once | INTRAVENOUS | Status: AC
  Administered 2020-11-26: 0.25 mg via INTRAVENOUS
  Filled 2020-11-26: qty 5

## 2020-11-26 MED ORDER — DEXTROSE 5 % IV SOLN
Freq: Once | INTRAVENOUS | Status: AC
  Filled 2020-11-26: qty 250

## 2020-11-26 MED ORDER — OXALIPLATIN CHEMO INJECTION 100 MG/20ML
85.0000 mg/m2 | Freq: Once | INTRAVENOUS | Status: AC
  Administered 2020-11-26: 135 mg via INTRAVENOUS
  Filled 2020-11-26: qty 20

## 2020-11-26 MED ORDER — SODIUM CHLORIDE 0.9 % IV SOLN
2400.0000 mg/m2 | INTRAVENOUS | Status: DC
  Administered 2020-11-26: 3750 mg via INTRAVENOUS
  Filled 2020-11-26: qty 75

## 2020-11-26 MED ORDER — SODIUM CHLORIDE 0.9 % IV SOLN
Freq: Once | INTRAVENOUS | Status: AC
  Filled 2020-11-26: qty 250

## 2020-11-26 MED ORDER — SODIUM CHLORIDE 0.9 % IV SOLN
10.0000 mg | Freq: Once | INTRAVENOUS | Status: AC
  Administered 2020-11-26: 10 mg via INTRAVENOUS
  Filled 2020-11-26: qty 10

## 2020-11-26 MED ORDER — SODIUM CHLORIDE 0.9% FLUSH
10.0000 mL | INTRAVENOUS | Status: DC | PRN
  Filled 2020-11-26: qty 10

## 2020-11-26 MED ORDER — FLUOROURACIL CHEMO INJECTION 2.5 GM/50ML
400.0000 mg/m2 | Freq: Once | INTRAVENOUS | Status: AC
  Administered 2020-11-26: 650 mg via INTRAVENOUS
  Filled 2020-11-26: qty 13

## 2020-11-26 MED ORDER — SODIUM CHLORIDE 0.9 % IV SOLN
10.0000 mg/kg | Freq: Once | INTRAVENOUS | Status: AC
  Administered 2020-11-26: 600 mg via INTRAVENOUS
  Filled 2020-11-26: qty 16

## 2020-11-26 NOTE — Patient Instructions (Signed)
[facility]  Discharge Instructions: Thank you for choosing [facility] to provide your oncology and hematology care.  If you have a lab appointment with the [facility], please go directly to the [facility] and check in at the registration area.  Wear comfortable clothing and clothing appropriate for easy access to any Portacath or PICC line.   We strive to give you quality time with your provider. You may need to reschedule your appointment if you arrive late (15 or more minutes).  Arriving late affects you and other patients whose appointments are after yours.  Also, if you miss three or more appointments without notifying the office, you may be dismissed from the clinic at the provider's discretion.      For prescription refill requests, have your pharmacy contact our office and allow 72 hours for refills to be completed.    Today you received the following chemotherapy and/or immunotherapy agents avastin, oxaliplatin, leucovorin, 5FU      To help prevent nausea and vomiting after your treatment, we encourage you to take your nausea medication as directed.  BELOW ARE SYMPTOMS THAT SHOULD BE REPORTED IMMEDIATELY: . *FEVER GREATER THAN 100.4 F (38 C) OR HIGHER . *CHILLS OR SWEATING . *NAUSEA AND VOMITING THAT IS NOT CONTROLLED WITH YOUR NAUSEA MEDICATION . *UNUSUAL SHORTNESS OF BREATH . *UNUSUAL BRUISING OR BLEEDING . *URINARY PROBLEMS (pain or burning when urinating, or frequent urination) . *BOWEL PROBLEMS (unusual diarrhea, constipation, pain near the anus) . TENDERNESS IN MOUTH AND THROAT WITH OR WITHOUT PRESENCE OF ULCERS (sore throat, sores in mouth, or a toothache) . UNUSUAL RASH, SWELLING OR PAIN  . UNUSUAL VAGINAL DISCHARGE OR ITCHING   Items with * indicate a potential emergency and should be followed up as soon as possible or go to the Emergency Department if any problems should occur.  Please show the CHEMOTHERAPY  ALERT CARD or IMMUNOTHERAPY ALERT CARD at check-in to the Emergency Department and triage nurse.  Should you have questions after your visit or need to cancel or reschedule your appointment, please contact [facility]  [phone] and follow the prompts.  Office hours are 8:00 a.m. to 4:30 p.m. Monday - Friday. Please note that voicemails left after 4:00 p.m. may not be returned until the following business day.  We are closed weekends and major holidays. You have access to a nurse at all times for urgent questions. Please call the main number to the clinic [phone] and follow the prompts.  For any non-urgent questions, you may also contact your provider using MyChart. We now offer e-Visits for anyone 68 and older to request care online for non-urgent symptoms. For details visit [email or web address].   Also download the MyChart app! Go to the app store, search "MyChart", open the app, select , and log in with your MyChart username and password.  Due to Covid, a mask is required upon entering the hospital/clinic. If you do not have a mask, one will be given to you upon arrival. For doctor visits, patients may have 1 support person aged 23 or older with them. For treatment visits, patients cannot have anyone with them due to current Covid guidelines and our immunocompromised population.   Oxaliplatin Injection What is this medicine? OXALIPLATIN (ox AL i PLA tin) is a chemotherapy drug. It targets fast dividing cells, like cancer cells, and causes these cells to die. This medicine is used to treat cancers of the colon and rectum,  and many other cancers. This medicine may be used for other purposes; ask your health care provider or pharmacist if you have questions. COMMON BRAND NAME(S): Eloxatin What should I tell my health care provider before I take this medicine? They need to know if you have any of these conditions:  heart disease  history of  irregular heartbeat  liver disease  low blood counts, like white cells, platelets, or red blood cells  lung or breathing disease, like asthma  take medicines that treat or prevent blood clots  tingling of the fingers or toes, or other nerve disorder  an unusual or allergic reaction to oxaliplatin, other chemotherapy, other medicines, foods, dyes, or preservatives  pregnant or trying to get pregnant  breast-feeding How should I use this medicine? This drug is given as an infusion into a vein. It is administered in a hospital or clinic by a specially trained health care professional. Talk to your pediatrician regarding the use of this medicine in children. Special care may be needed. Overdosage: If you think you have taken too much of this medicine contact a poison control center or emergency room at once. NOTE: This medicine is only for you. Do not share this medicine with others. What if I miss a dose? It is important not to miss a dose. Call your doctor or health care professional if you are unable to keep an appointment. What may interact with this medicine? Do not take this medicine with any of the following medications:  cisapride  dronedarone  pimozide  thioridazine This medicine may also interact with the following medications:  aspirin and aspirin-like medicines  certain medicines that treat or prevent blood clots like warfarin, apixaban, dabigatran, and rivaroxaban  cisplatin  cyclosporine  diuretics  medicines for infection like acyclovir, adefovir, amphotericin B, bacitracin, cidofovir, foscarnet, ganciclovir, gentamicin, pentamidine, vancomycin  NSAIDs, medicines for pain and inflammation, like ibuprofen or naproxen  other medicines that prolong the QT interval (an abnormal heart rhythm)  pamidronate  zoledronic acid This list may not describe all possible interactions. Give your health care provider a list of all the medicines, herbs,  non-prescription drugs, or dietary supplements you use. Also tell them if you smoke, drink alcohol, or use illegal drugs. Some items may interact with your medicine. What should I watch for while using this medicine? Your condition will be monitored carefully while you are receiving this medicine. You may need blood work done while you are taking this medicine. This medicine may make you feel generally unwell. This is not uncommon as chemotherapy can affect healthy cells as well as cancer cells. Report any side effects. Continue your course of treatment even though you feel ill unless your healthcare professional tells you to stop. This medicine can make you more sensitive to cold. Do not drink cold drinks or use ice. Cover exposed skin before coming in contact with cold temperatures or cold objects. When out in cold weather wear warm clothing and cover your mouth and nose to warm the air that goes into your lungs. Tell your doctor if you get sensitive to the cold. Do not become pregnant while taking this medicine or for 9 months after stopping it. Women should inform their health care professional if they wish to become pregnant or think they might be pregnant. Men should not father a child while taking this medicine and for 6 months after stopping it. There is potential for serious side effects to an unborn child. Talk to your health care professional  for more information. Do not breast-feed a child while taking this medicine or for 3 months after stopping it. This medicine has caused ovarian failure in some women. This medicine may make it more difficult to get pregnant. Talk to your health care professional if you are concerned about your fertility. This medicine has caused decreased sperm counts in some men. This may make it more difficult to father a child. Talk to your health care professional if you are concerned about your fertility. This medicine may increase your risk of getting an infection.  Call your health care professional for advice if you get a fever, chills, or sore throat, or other symptoms of a cold or flu. Do not treat yourself. Try to avoid being around people who are sick. Avoid taking medicines that contain aspirin, acetaminophen, ibuprofen, naproxen, or ketoprofen unless instructed by your health care professional. These medicines may hide a fever. Be careful brushing or flossing your teeth or using a toothpick because you may get an infection or bleed more easily. If you have any dental work done, tell your dentist you are receiving this medicine. What side effects may I notice from receiving this medicine? Side effects that you should report to your doctor or health care professional as soon as possible:  allergic reactions like skin rash, itching or hives, swelling of the face, lips, or tongue  breathing problems  cough  low blood counts - this medicine may decrease the number of white blood cells, red blood cells, and platelets. You may be at increased risk for infections and bleeding  nausea, vomiting  pain, redness, or irritation at site where injected  pain, tingling, numbness in the hands or feet  signs and symptoms of bleeding such as bloody or black, tarry stools; red or dark brown urine; spitting up blood or brown material that looks like coffee grounds; red spots on the skin; unusual bruising or bleeding from the eyes, gums, or nose  signs and symptoms of a dangerous change in heartbeat or heart rhythm like chest pain; dizziness; fast, irregular heartbeat; palpitations; feeling faint or lightheaded; falls  signs and symptoms of infection like fever; chills; cough; sore throat; pain or trouble passing urine  signs and symptoms of liver injury like dark yellow or brown urine; general ill feeling or flu-like symptoms; light-colored stools; loss of appetite; nausea; right upper belly pain; unusually weak or tired; yellowing of the eyes or skin  signs and  symptoms of low red blood cells or anemia such as unusually weak or tired; feeling faint or lightheaded; falls  signs and symptoms of muscle injury like dark urine; trouble passing urine or change in the amount of urine; unusually weak or tired; muscle pain; back pain Side effects that usually do not require medical attention (report to your doctor or health care professional if they continue or are bothersome):  changes in taste  diarrhea  gas  hair loss  loss of appetite  mouth sores This list may not describe all possible side effects. Call your doctor for medical advice about side effects. You may report side effects to FDA at 1-800-FDA-1088. Where should I keep my medicine? This drug is given in a hospital or clinic and will not be stored at home. NOTE: This sheet is a summary. It may not cover all possible information. If you have questions about this medicine, talk to your doctor, pharmacist, or health care provider.  2021 Elsevier/Gold Standard (2018-12-06 12:20:35)  Leucovorin injection What is this medicine?  LEUCOVORIN (loo koe VOR in) is used to prevent or treat the harmful effects of some medicines. This medicine is used to treat anemia caused by a low amount of folic acid in the body. It is also used with 5-fluorouracil (5-FU) to treat colon cancer. This medicine may be used for other purposes; ask your health care provider or pharmacist if you have questions. What should I tell my health care provider before I take this medicine? They need to know if you have any of these conditions:  anemia from low levels of vitamin B-12 in the blood  an unusual or allergic reaction to leucovorin, folic acid, other medicines, foods, dyes, or preservatives  pregnant or trying to get pregnant  breast-feeding How should I use this medicine? This medicine is for injection into a muscle or into a vein. It is given by a health care professional in a hospital or clinic setting. Talk to  your pediatrician regarding the use of this medicine in children. Special care may be needed. Overdosage: If you think you have taken too much of this medicine contact a poison control center or emergency room at once. NOTE: This medicine is only for you. Do not share this medicine with others. What if I miss a dose? This does not apply. What may interact with this medicine?  capecitabine  fluorouracil  phenobarbital  phenytoin  primidone  trimethoprim-sulfamethoxazole This list may not describe all possible interactions. Give your health care provider a list of all the medicines, herbs, non-prescription drugs, or dietary supplements you use. Also tell them if you smoke, drink alcohol, or use illegal drugs. Some items may interact with your medicine. What should I watch for while using this medicine? Your condition will be monitored carefully while you are receiving this medicine. This medicine may increase the side effects of 5-fluorouracil, 5-FU. Tell your doctor or health care professional if you have diarrhea or mouth sores that do not get better or that get worse. What side effects may I notice from receiving this medicine? Side effects that you should report to your doctor or health care professional as soon as possible:  allergic reactions like skin rash, itching or hives, swelling of the face, lips, or tongue  breathing problems  fever, infection  mouth sores  unusual bleeding or bruising  unusually weak or tired Side effects that usually do not require medical attention (report to your doctor or health care professional if they continue or are bothersome):  constipation or diarrhea  loss of appetite  nausea, vomiting This list may not describe all possible side effects. Call your doctor for medical advice about side effects. You may report side effects to FDA at 1-800-FDA-1088. Where should I keep my medicine? This drug is given in a hospital or clinic and will  not be stored at home. NOTE: This sheet is a summary. It may not cover all possible information. If you have questions about this medicine, talk to your doctor, pharmacist, or health care provider.  2021 Elsevier/Gold Standard (2008-01-23 16:50:29)  Fluorouracil, 5-FU injection What is this medicine? FLUOROURACIL, 5-FU (flure oh YOOR a sil) is a chemotherapy drug. It slows the growth of cancer cells. This medicine is used to treat many types of cancer like breast cancer, colon or rectal cancer, pancreatic cancer, and stomach cancer. This medicine may be used for other purposes; ask your health care provider or pharmacist if you have questions. COMMON BRAND NAME(S): Adrucil What should I tell my health care provider before  I take this medicine? They need to know if you have any of these conditions:  blood disorders  dihydropyrimidine dehydrogenase (DPD) deficiency  infection (especially a virus infection such as chickenpox, cold sores, or herpes)  kidney disease  liver disease  malnourished, poor nutrition  recent or ongoing radiation therapy  an unusual or allergic reaction to fluorouracil, other chemotherapy, other medicines, foods, dyes, or preservatives  pregnant or trying to get pregnant  breast-feeding How should I use this medicine? This drug is given as an infusion or injection into a vein. It is administered in a hospital or clinic by a specially trained health care professional. Talk to your pediatrician regarding the use of this medicine in children. Special care may be needed. Overdosage: If you think you have taken too much of this medicine contact a poison control center or emergency room at once. NOTE: This medicine is only for you. Do not share this medicine with others. What if I miss a dose? It is important not to miss your dose. Call your doctor or health care professional if you are unable to keep an appointment. What may interact with this medicine? Do not  take this medicine with any of the following medications:  live virus vaccines This medicine may also interact with the following medications:  medicines that treat or prevent blood clots like warfarin, enoxaparin, and dalteparin This list may not describe all possible interactions. Give your health care provider a list of all the medicines, herbs, non-prescription drugs, or dietary supplements you use. Also tell them if you smoke, drink alcohol, or use illegal drugs. Some items may interact with your medicine. What should I watch for while using this medicine? Visit your doctor for checks on your progress. This drug may make you feel generally unwell. This is not uncommon, as chemotherapy can affect healthy cells as well as cancer cells. Report any side effects. Continue your course of treatment even though you feel ill unless your doctor tells you to stop. In some cases, you may be given additional medicines to help with side effects. Follow all directions for their use. Call your doctor or health care professional for advice if you get a fever, chills or sore throat, or other symptoms of a cold or flu. Do not treat yourself. This drug decreases your body's ability to fight infections. Try to avoid being around people who are sick. This medicine may increase your risk to bruise or bleed. Call your doctor or health care professional if you notice any unusual bleeding. Be careful brushing and flossing your teeth or using a toothpick because you may get an infection or bleed more easily. If you have any dental work done, tell your dentist you are receiving this medicine. Avoid taking products that contain aspirin, acetaminophen, ibuprofen, naproxen, or ketoprofen unless instructed by your doctor. These medicines may hide a fever. Do not become pregnant while taking this medicine. Women should inform their doctor if they wish to become pregnant or think they might be pregnant. There is a potential for  serious side effects to an unborn child. Talk to your health care professional or pharmacist for more information. Do not breast-feed an infant while taking this medicine. Men should inform their doctor if they wish to father a child. This medicine may lower sperm counts. Do not treat diarrhea with over the counter products. Contact your doctor if you have diarrhea that lasts more than 2 days or if it is severe and watery. This medicine can  make you more sensitive to the sun. Keep out of the sun. If you cannot avoid being in the sun, wear protective clothing and use sunscreen. Do not use sun lamps or tanning beds/booths. What side effects may I notice from receiving this medicine? Side effects that you should report to your doctor or health care professional as soon as possible:  allergic reactions like skin rash, itching or hives, swelling of the face, lips, or tongue  low blood counts - this medicine may decrease the number of white blood cells, red blood cells and platelets. You may be at increased risk for infections and bleeding.  signs of infection - fever or chills, cough, sore throat, pain or difficulty passing urine  signs of decreased platelets or bleeding - bruising, pinpoint red spots on the skin, black, tarry stools, blood in the urine  signs of decreased red blood cells - unusually weak or tired, fainting spells, lightheadedness  breathing problems  changes in vision  chest pain  mouth sores  nausea and vomiting  pain, swelling, redness at site where injected  pain, tingling, numbness in the hands or feet  redness, swelling, or sores on hands or feet  stomach pain  unusual bleeding Side effects that usually do not require medical attention (report to your doctor or health care professional if they continue or are bothersome):  changes in finger or toe nails  diarrhea  dry or itchy skin  hair loss  headache  loss of appetite  sensitivity of eyes to the  light  stomach upset  unusually teary eyes This list may not describe all possible side effects. Call your doctor for medical advice about side effects. You may report side effects to FDA at 1-800-FDA-1088. Where should I keep my medicine? This drug is given in a hospital or clinic and will not be stored at home. NOTE: This sheet is a summary. It may not cover all possible information. If you have questions about this medicine, talk to your doctor, pharmacist, or health care provider.  2021 Elsevier/Gold Standard (2019-06-19 15:00:03)  Bevacizumab injection What is this medicine? BEVACIZUMAB (be va SIZ yoo mab) is a monoclonal antibody. It is used to treat many types of cancer. This medicine may be used for other purposes; ask your health care provider or pharmacist if you have questions. COMMON BRAND NAME(S): Avastin, MVASI, Zirabev What should I tell my health care provider before I take this medicine? They need to know if you have any of these conditions:  diabetes  heart disease  high blood pressure  history of coughing up blood  prior anthracycline chemotherapy (e.g., doxorubicin, daunorubicin, epirubicin)  recent or ongoing radiation therapy  recent or planning to have surgery  stroke  an unusual or allergic reaction to bevacizumab, hamster proteins, mouse proteins, other medicines, foods, dyes, or preservatives  pregnant or trying to get pregnant  breast-feeding How should I use this medicine? This medicine is for infusion into a vein. It is given by a health care professional in a hospital or clinic setting. Talk to your pediatrician regarding the use of this medicine in children. Special care may be needed. Overdosage: If you think you have taken too much of this medicine contact a poison control center or emergency room at once. NOTE: This medicine is only for you. Do not share this medicine with others. What if I miss a dose? It is important not to miss your  dose. Call your doctor or health care professional if you are  unable to keep an appointment. What may interact with this medicine? Interactions are not expected. This list may not describe all possible interactions. Give your health care provider a list of all the medicines, herbs, non-prescription drugs, or dietary supplements you use. Also tell them if you smoke, drink alcohol, or use illegal drugs. Some items may interact with your medicine. What should I watch for while using this medicine? Your condition will be monitored carefully while you are receiving this medicine. You will need important blood work and urine testing done while you are taking this medicine. This medicine may increase your risk to bruise or bleed. Call your doctor or health care professional if you notice any unusual bleeding. Before having surgery, talk to your health care provider to make sure it is ok. This drug can increase the risk of poor healing of your surgical site or wound. You will need to stop this drug for 28 days before surgery. After surgery, wait at least 28 days before restarting this drug. Make sure the surgical site or wound is healed enough before restarting this drug. Talk to your health care provider if questions. Do not become pregnant while taking this medicine or for 6 months after stopping it. Women should inform their doctor if they wish to become pregnant or think they might be pregnant. There is a potential for serious side effects to an unborn child. Talk to your health care professional or pharmacist for more information. Do not breast-feed an infant while taking this medicine and for 6 months after the last dose. This medicine has caused ovarian failure in some women. This medicine may interfere with the ability to have a child. You should talk to your doctor or health care professional if you are concerned about your fertility. What side effects may I notice from receiving this medicine? Side  effects that you should report to your doctor or health care professional as soon as possible:  allergic reactions like skin rash, itching or hives, swelling of the face, lips, or tongue  chest pain or chest tightness  chills  coughing up blood  high fever  seizures  severe constipation  signs and symptoms of bleeding such as bloody or black, tarry stools; red or dark-brown urine; spitting up blood or brown material that looks like coffee grounds; red spots on the skin; unusual bruising or bleeding from the eye, gums, or nose  signs and symptoms of a blood clot such as breathing problems; chest pain; severe, sudden headache; pain, swelling, warmth in the leg  signs and symptoms of a stroke like changes in vision; confusion; trouble speaking or understanding; severe headaches; sudden numbness or weakness of the face, arm or leg; trouble walking; dizziness; loss of balance or coordination  stomach pain  sweating  swelling of legs or ankles  vomiting  weight gain Side effects that usually do not require medical attention (report to your doctor or health care professional if they continue or are bothersome):  back pain  changes in taste  decreased appetite  dry skin  nausea  tiredness This list may not describe all possible side effects. Call your doctor for medical advice about side effects. You may report side effects to FDA at 1-800-FDA-1088. Where should I keep my medicine? This drug is given in a hospital or clinic and will not be stored at home. NOTE: This sheet is a summary. It may not cover all possible information. If you have questions about this medicine, talk to your doctor,  pharmacist, or health care provider.  2021 Elsevier/Gold Standard (2019-05-16 10:50:46)

## 2020-11-26 NOTE — Progress Notes (Signed)
Nutrition Follow-up:  Patient with metastatic colorectal adenocarcinoma.  Patient receiving folfox and bevacizumab.    Spoke with patient during infusion.  Patient reports that appetite is much better.  Eating "anything I want."  Reports that she is eating good source of protein and  craving sweets (fruits, juice, chocolate).     Medications: reviewed  Labs: reviewed  Anthropometrics:   Weight 134 lb today increased from 133 lb on 2/23  127 lb on 1/19   NUTRITION DIAGNOSIS: Inadequate oral intake improving   INTERVENTION:  Patient to continue eating good sources of protein and well balanced diet to prevent weight loss.     MONITORING, EVALUATION, GOAL: weight trends, intake   NEXT VISIT: Wednesday, May 11 during infusion  Abril Cappiello B. Zenia Resides, [place], [place] Registered Dietitian [phone] (mobile)

## 2020-11-26 NOTE — Progress Notes (Signed)
Pt tolerated all infusions well today with no problem or complaints. Pt left chemo suite stable and ambulatory with her 5FU pump infusing as ordered.

## 2020-11-28 ENCOUNTER — Inpatient Hospital Stay: Payer: Medicaid Other

## 2020-11-28 VITALS — BP 91/60 | HR 72 | Temp 98.0°F | Resp 17

## 2020-11-28 DIAGNOSIS — C19 Malignant neoplasm of rectosigmoid junction: Secondary | ICD-10-CM

## 2020-11-28 DIAGNOSIS — Z5112 Encounter for antineoplastic immunotherapy: Secondary | ICD-10-CM | POA: Diagnosis not present

## 2020-11-28 MED ORDER — HEPARIN SOD (PORK) LOCK FLUSH 100 UNIT/ML IV SOLN
500.0000 [IU] | Freq: Once | INTRAVENOUS | Status: AC | PRN
  Administered 2020-11-28: 500 [IU]
  Filled 2020-11-28: qty 5

## 2020-11-28 MED ORDER — HEPARIN SOD (PORK) LOCK FLUSH 100 UNIT/ML IV SOLN
INTRAVENOUS | Status: AC
  Filled 2020-11-28: qty 5

## 2020-11-28 MED ORDER — SODIUM CHLORIDE 0.9% FLUSH
10.0000 mL | INTRAVENOUS | Status: DC | PRN
  Administered 2020-11-28: 10 mL
  Filled 2020-11-28: qty 10

## 2020-11-28 MED ORDER — PEGFILGRASTIM-CBQV 6 MG/0.6ML ~~LOC~~ SOSY
6.0000 mg | PREFILLED_SYRINGE | Freq: Once | SUBCUTANEOUS | Status: AC
  Administered 2020-11-28: 6 mg via SUBCUTANEOUS
  Filled 2020-11-28: qty 0.6

## 2020-11-28 NOTE — Patient Instructions (Signed)
Pegfilgrastim injection What is this medicine? PEGFILGRASTIM (PEG fil gra stim) is a long-acting granulocyte colony-stimulating factor that stimulates the growth of neutrophils, a type of white blood cell important in the body's fight against infection. It is used to reduce the incidence of fever and infection in patients with certain types of cancer who are receiving chemotherapy that affects the bone marrow, and to increase survival after being exposed to high doses of radiation. This medicine may be used for other purposes; ask your health care provider or pharmacist if you have questions. COMMON BRAND NAME(S): Fulphila, Neulasta, Nyvepria, UDENYCA, Ziextenzo What should I tell my health care provider before I take this medicine? They need to know if you have any of these conditions:  kidney disease  latex allergy  ongoing radiation therapy  sickle cell disease  skin reactions to acrylic adhesives (On-Body Injector only)  an unusual or allergic reaction to pegfilgrastim, filgrastim, other medicines, foods, dyes, or preservatives  pregnant or trying to get pregnant  breast-feeding How should I use this medicine? This medicine is for injection under the skin. If you get this medicine at home, you will be taught how to prepare and give the pre-filled syringe or how to use the On-body Injector. Refer to the patient Instructions for Use for detailed instructions. Use exactly as directed. Tell your healthcare provider immediately if you suspect that the On-body Injector may not have performed as intended or if you suspect the use of the On-body Injector resulted in a missed or partial dose. It is important that you put your used needles and syringes in a special sharps container. Do not put them in a trash can. If you do not have a sharps container, call your pharmacist or healthcare provider to get one. Talk to your pediatrician regarding the use of this medicine in children. While this drug  may be prescribed for selected conditions, precautions do apply. Overdosage: If you think you have taken too much of this medicine contact a poison control center or emergency room at once. NOTE: This medicine is only for you. Do not share this medicine with others. What if I miss a dose? It is important not to miss your dose. Call your doctor or health care professional if you miss your dose. If you miss a dose due to an On-body Injector failure or leakage, a new dose should be administered as soon as possible using a single prefilled syringe for manual use. What may interact with this medicine? Interactions have not been studied. This list may not describe all possible interactions. Give your health care provider a list of all the medicines, herbs, non-prescription drugs, or dietary supplements you use. Also tell them if you smoke, drink alcohol, or use illegal drugs. Some items may interact with your medicine. What should I watch for while using this medicine? Your condition will be monitored carefully while you are receiving this medicine. You may need blood work done while you are taking this medicine. Talk to your health care provider about your risk of cancer. You may be more at risk for certain types of cancer if you take this medicine. If you are going to need a MRI, CT scan, or other procedure, tell your doctor that you are using this medicine (On-Body Injector only). What side effects may I notice from receiving this medicine? Side effects that you should report to your doctor or health care professional as soon as possible:  allergic reactions (skin rash, itching or hives, swelling of   the face, lips, or tongue)  back pain  dizziness  fever  pain, redness, or irritation at site where injected  pinpoint red spots on the skin  red or dark-brown urine  shortness of breath or breathing problems  stomach or side pain, or pain at the shoulder  swelling  tiredness  trouble  passing urine or change in the amount of urine  unusual bruising or bleeding Side effects that usually do not require medical attention (report to your doctor or health care professional if they continue or are bothersome):  bone pain  muscle pain This list may not describe all possible side effects. Call your doctor for medical advice about side effects. You may report side effects to FDA at 1-800-FDA-1088. Where should I keep my medicine? Keep out of the reach of children. If you are using this medicine at home, you will be instructed on how to store it. Throw away any unused medicine after the expiration date on the label. NOTE: This sheet is a summary. It may not cover all possible information. If you have questions about this medicine, talk to your doctor, pharmacist, or health care provider.  2021 Elsevier/Gold Standard (2019-08-10 13:20:51)  

## 2020-12-05 NOTE — Progress Notes (Signed)
[facility]  [phone] [phone]) [phone]  ID: Anita Sullivan OB: October 24, 1965  MR#: [id number]  TRZ#:[id number]  Patient Care Team: [name], NP as PCP - General (Nurse Practitioner) [name], MD as Consulting Physician (Oncology) [name], RN as Oncology Nurse Navigator  CHIEF COMPLAINT: Metastatic colorectal adenocarcinoma, KRAS wild-type  INTERVAL HISTORY: Patient returns to clinic today for further evaluation and consideration of cycle 12 of FOLFOX plus Avastin.  She has a nonpainful peripheral neuropathy in her hands and feet that is slightly worse, but does not affect her day-to-day activity.  She otherwise feels well and is tolerating her treatments without significant side effects.  She has no other neurologic complaints.  She denies any weakness or fatigue. She no longer has rectal bleeding. She has a good appetite and denies weight loss.  She denies any chest pain, shortness of breath, cough, or hemoptysis.  She denies any nausea, vomiting, constipation, or diarrhea.  She has no urinary complaints.  Patient offers no further specific complaints today.  REVIEW OF SYSTEMS:   Review of Systems  Constitutional: Negative.  Negative for fever, malaise/fatigue and weight loss.  HENT: Negative.  Negative for congestion.   Respiratory: Negative.  Negative for cough, hemoptysis and shortness of breath.   Cardiovascular: Negative.  Negative for chest pain and leg swelling.  Gastrointestinal: Negative for blood in stool, constipation, nausea and vomiting.  Genitourinary: Negative.  Negative for flank pain and hematuria.  Musculoskeletal: Negative.  Negative for back pain.  Skin: Negative.  Negative for rash.  Neurological: Positive for tingling and sensory change. Negative for dizziness, seizures, weakness and headaches.  Psychiatric/Behavioral: Negative.  The patient is not nervous/anxious.     As per HPI. Otherwise, a  complete review of systems is negative.  PAST MEDICAL HISTORY: Past Medical History:  Diagnosis Date  . Colon cancer ([place])   . Family history of pancreatic cancer   . Family history of uterine cancer     PAST SURGICAL HISTORY: Past Surgical History:  Procedure Laterality Date  . COLONOSCOPY WITH PROPOFOL N/A 05/27/2020   Procedure: COLONOSCOPY WITH PROPOFOL;  Surgeon: [name], MD;  Location[facility] ENDOSCOPY;  Service: Gastroenterology;  Laterality: N/A;  . FLEXIBLE SIGMOIDOSCOPY N/A 05/28/2020   Procedure: FLEXIBLE SIGMOIDOSCOPY;  Surgeon: [name], MD;  Location: [facility] ENDOSCOPY;  Service: Gastroenterology;  Laterality: N/A;  . IR NEPHROSTOMY EXCHANGE LEFT  07/11/2020  . IR NEPHROSTOMY EXCHANGE RIGHT  07/11/2020  . IR NEPHROSTOMY PLACEMENT LEFT  05/23/2020  . IR NEPHROSTOMY PLACEMENT RIGHT  05/23/2020  . IR URETERAL STENT LEFT NEW ACCESS W/O SEP NEPHROSTOMY CATH  08/15/2020  . IR URETERAL STENT RIGHT NEW ACCESS W/O SEP NEPHROSTOMY CATH  08/15/2020  . PORTACATH PLACEMENT Left 05/29/2020   Procedure: INSERTION PORT-A-CATH;  Surgeon: [name], MD;  Location: [facility] ORS;  Service: General;  Laterality: Left;    FAMILY HISTORY: Family History  Problem Relation Age of Onset  . Other Mother        Corticobasal degeneration  . Uterine cancer Mother   . Diabetes Maternal Grandmother   . Stroke Maternal Grandfather   . Stroke Paternal Grandfather   . Heart disease Father   . Pancreatic cancer Maternal Uncle 51  . Cancer Paternal Aunt        unk type  . Liver cancer Maternal Uncle   . Pancreatic cancer Cousin   . Cancer Cousin        unk type  ADVANCED DIRECTIVES (Y/N):  N  HEALTH MAINTENANCE: Social History   Tobacco Use  . Smoking status: Never Smoker  . Smokeless tobacco: Never Used  Vaping Use  . Vaping Use: Never used  Substance Use Topics  . Alcohol use: Never  . Drug use: Never     Colonoscopy:  PAP:  Bone density:  Lipid  panel:  No Known Allergies  Current Outpatient Medications  Medication Sig Dispense Refill  . acetaminophen (TYLENOL) 325 MG tablet Take 650 mg by mouth every 6 (six) hours as needed.    Marland Kitchen HYDROcodone-acetaminophen (NORCO) 5-325 MG tablet Take 1 tablet by mouth every 6 (six) hours as needed for moderate pain. 30 tablet 0  . lidocaine-prilocaine (EMLA) cream Apply 1 application topically as needed. Apply prior to appointment. Cover with plastic wrap. 30 g 2  . loratadine (CLARITIN) 10 MG tablet Take 10 mg by mouth daily.    . ondansetron (ZOFRAN) 4 MG tablet Take 1 tablet (4 mg total) by mouth every 8 (eight) hours as needed for nausea or vomiting. 30 tablet 2  . pantoprazole (PROTONIX) 40 MG tablet Take 1 tablet (40 mg total) by mouth 2 (two) times daily. 60 tablet 0   No current facility-administered medications for this visit.   Facility-Administered Medications Ordered in Other Visits  Medication Dose Route Frequency Provider Last Rate Last Admin  . bevacizumab-bvzr (ZIRABEV) 600 mg in sodium chloride 0.9 % 100 mL chemo infusion  10 mg/kg (Treatment Plan Recorded) Intravenous Once [name], MD      . dextrose 5 % solution   Intravenous Once [name], MD      . fluorouracil (ADRUCIL) 3,750 mg in sodium chloride 0.9 % 75 mL chemo infusion  2,400 mg/m2 (Treatment Plan Recorded) Intravenous 1 day or 1 dose [name], MD      . fluorouracil (ADRUCIL) chemo injection 650 mg  400 mg/m2 (Treatment Plan Recorded) Intravenous Once [name], MD      . heparin lock flush 100 unit/mL  500 Units Intravenous Once [name], MD      . leucovorin 650 mg in dextrose 5 % 250 mL infusion  650 mg Intravenous Once [name], MD      . oxaliplatin (ELOXATIN) 135 mg in dextrose 5 % 500 mL chemo infusion  85 mg/m2 (Treatment Plan Recorded) Intravenous Once [name], MD      . sodium chloride flush (NS) 0.9 % injection 10 mL  10 mL  Intravenous Once [name], MD        OBJECTIVE: Vitals:   12/10/20 0910  BP: 106/68  Pulse: 73  Resp: 16  Temp: (!) 97 F (36.1 C)     Body mass index is 25.7 kg/m.    ECOG FS:0 - Asymptomatic  General: Well-developed, well-nourished, no acute distress. Eyes: Pink conjunctiva, anicteric sclera. HEENT: Normocephalic, moist mucous membranes. Lungs: No audible wheezing or coughing. Heart: Regular rate and rhythm. Abdomen: Soft, nontender, no obvious distention. Musculoskeletal: No edema, cyanosis, or clubbing. Neuro: Alert, answering all questions appropriately. Cranial nerves grossly intact. Skin: No rashes or petechiae noted. Psych: Normal affect.   LAB RESULTS:  Lab Results  Component Value Date   NA 136 12/10/2020   K 3.6 12/10/2020   CL 106 12/10/2020   CO2 22 12/10/2020   GLUCOSE 87 12/10/2020   BUN 16 12/10/2020   CREATININE 1.32 (H) 12/10/2020   CALCIUM 8.5 (L) 12/10/2020   PROT 7.1  12/10/2020   ALBUMIN 3.3 (L) 12/10/2020   AST 38 12/10/2020   ALT 20 12/10/2020   ALKPHOS 152 (H) 12/10/2020   BILITOT 0.5 12/10/2020   GFRNONAA 49 (L) 12/10/2020    Lab Results  Component Value Date   WBC 8.2 12/10/2020   NEUTROABS 5.2 12/10/2020   HGB 9.9 (L) 12/10/2020   HCT 31.2 (L) 12/10/2020   MCV 104.3 (H) 12/10/2020   PLT 127 (L) 12/10/2020     STUDIES: No results found.  ASSESSMENT: Metastatic colorectal adenocarcinoma,  KRAS wild-type.  PLAN:   1. Metastatic colorectal adenocarcinoma: Although unclear whether malignancy originated in colon or rectum, given the pattern of spread to liver, will treat as a stage IV colon cancer.  CEA was only mildly elevated at 9.9.  Patient has had port placement. Initial plan was to treat with FOLFOX plus Avastin every 2 weeks.  Can consider Panitumumab as second line.  Avastin was held for the first 6 cycles secondary to persistent rectal bleeding, but this is now resolved.  CT scan results from September 03, 2020  reviewed independently with significant improvement of patient's disease burden.  Proceed with cycle 12 of FOLFOX plus Avastin today.  Return to clinic in 2 days for pump removal.  Repeat CT scan in 4 weeks and then patient will follow-up 1 to 2 days later to discuss the results and determine whether additional treatment is necessary. Appreciate palliative care and genetics input.   2.  Anemia: Chronic and unchanged.  Patient's hemoglobin is 9.9 today. 3.  Renal insufficiency: Creatinine is trended up slightly to 1.32, monitor. 4.  Neutropenia: Resolved.  Udenyca with pump removal as above. 5.  Nephrostomy tubes: Internalized on August 15, 2020. 6.  Thrombocytopenia: Chronic and unchanged.  Patient platelet count is 127.  Proceed with treatment as above. 7.  Peripheral neuropathy: Slightly worse today.  Patient will get a slight chemo holiday as above.  If her neuropathy becomes worse or painful will consider referral to neuro oncology.  Patient expressed understanding and was in agreement with this plan. She also understands that She can call clinic at any time with any questions, concerns, or complaints.   Cancer Staging Metastatic colorectal cancer [facility]) Staging form: Colon and Rectum, AJCC 8th Edition - Clinical stage from 05/31/2020: Stage IVC (cTX, cNX, pM1c) - Signed by [name], MD on 05/31/2020   [name], MD   12/10/2020 10:37 AM

## 2020-12-10 ENCOUNTER — Other Ambulatory Visit: Payer: Self-pay

## 2020-12-10 ENCOUNTER — Inpatient Hospital Stay: Payer: Medicaid Other

## 2020-12-10 ENCOUNTER — Inpatient Hospital Stay: Payer: Medicaid Other | Attending: Oncology

## 2020-12-10 ENCOUNTER — Encounter: Payer: Self-pay | Admitting: Oncology

## 2020-12-10 ENCOUNTER — Inpatient Hospital Stay (HOSPITAL_BASED_OUTPATIENT_CLINIC_OR_DEPARTMENT_OTHER): Payer: Medicaid Other | Admitting: Oncology

## 2020-12-10 VITALS — BP 106/68 | HR 73 | Temp 97.0°F | Resp 16 | Wt 131.6 lb

## 2020-12-10 DIAGNOSIS — C19 Malignant neoplasm of rectosigmoid junction: Secondary | ICD-10-CM

## 2020-12-10 DIAGNOSIS — G629 Polyneuropathy, unspecified: Secondary | ICD-10-CM | POA: Diagnosis not present

## 2020-12-10 DIAGNOSIS — Z5189 Encounter for other specified aftercare: Secondary | ICD-10-CM | POA: Diagnosis not present

## 2020-12-10 DIAGNOSIS — Z8049 Family history of malignant neoplasm of other genital organs: Secondary | ICD-10-CM | POA: Insufficient documentation

## 2020-12-10 DIAGNOSIS — Z8249 Family history of ischemic heart disease and other diseases of the circulatory system: Secondary | ICD-10-CM | POA: Diagnosis not present

## 2020-12-10 DIAGNOSIS — D649 Anemia, unspecified: Secondary | ICD-10-CM | POA: Diagnosis not present

## 2020-12-10 DIAGNOSIS — N289 Disorder of kidney and ureter, unspecified: Secondary | ICD-10-CM | POA: Diagnosis not present

## 2020-12-10 DIAGNOSIS — Z833 Family history of diabetes mellitus: Secondary | ICD-10-CM | POA: Insufficient documentation

## 2020-12-10 DIAGNOSIS — Z823 Family history of stroke: Secondary | ICD-10-CM | POA: Diagnosis not present

## 2020-12-10 DIAGNOSIS — Z809 Family history of malignant neoplasm, unspecified: Secondary | ICD-10-CM | POA: Insufficient documentation

## 2020-12-10 DIAGNOSIS — Z800 Family history of malignant neoplasm of digestive organs: Secondary | ICD-10-CM | POA: Diagnosis not present

## 2020-12-10 DIAGNOSIS — D696 Thrombocytopenia, unspecified: Secondary | ICD-10-CM | POA: Insufficient documentation

## 2020-12-10 DIAGNOSIS — Z79899 Other long term (current) drug therapy: Secondary | ICD-10-CM | POA: Diagnosis not present

## 2020-12-10 LAB — COMPREHENSIVE METABOLIC PANEL
ALT: 20 U/L (ref 0–44)
AST: 38 U/L (ref 15–41)
Albumin: 3.3 g/dL — ABNORMAL LOW (ref 3.5–5.0)
Alkaline Phosphatase: 152 U/L — ABNORMAL HIGH (ref 38–126)
Anion gap: 8 (ref 5–15)
BUN: 16 mg/dL (ref 6–20)
CO2: 22 mmol/L (ref 22–32)
Calcium: 8.5 mg/dL — ABNORMAL LOW (ref 8.9–10.3)
Chloride: 106 mmol/L (ref 98–111)
Creatinine, Ser: 1.32 mg/dL — ABNORMAL HIGH (ref 0.44–1.00)
GFR, Estimated: 49 mL/min — ABNORMAL LOW (ref >60)
Glucose, Bld: 87 mg/dL (ref 70–99)
Potassium: 3.6 mmol/L (ref 3.5–5.1)
Sodium: 136 mmol/L (ref 135–145)
Total Bilirubin: 0.5 mg/dL (ref 0.3–1.2)
Total Protein: 7.1 g/dL (ref 6.5–8.1)

## 2020-12-10 LAB — CBC WITH DIFFERENTIAL/PLATELET
Abs Immature Granulocytes: 0.05 10*3/uL (ref 0.00–0.07)
Basophils Absolute: 0 10*3/uL (ref 0.0–0.1)
Basophils Relative: 1 %
Eosinophils Absolute: 0.1 10*3/uL (ref 0.0–0.5)
Eosinophils Relative: 1 %
HCT: 31.2 % — ABNORMAL LOW (ref 36.0–46.0)
Hemoglobin: 9.9 g/dL — ABNORMAL LOW (ref 12.0–15.0)
Immature Granulocytes: 1 %
Lymphocytes Relative: 23 %
Lymphs Abs: 1.9 10*3/uL (ref 0.7–4.0)
MCH: 33.1 pg (ref 26.0–34.0)
MCHC: 31.7 g/dL (ref 30.0–36.0)
MCV: 104.3 fL — ABNORMAL HIGH (ref 80.0–100.0)
Monocytes Absolute: 0.9 10*3/uL (ref 0.1–1.0)
Monocytes Relative: 10 %
Neutro Abs: 5.2 10*3/uL (ref 1.7–7.7)
Neutrophils Relative %: 64 %
Platelets: 127 10*3/uL — ABNORMAL LOW (ref 150–400)
RBC: 2.99 MIL/uL — ABNORMAL LOW (ref 3.87–5.11)
RDW: 16.8 % — ABNORMAL HIGH (ref 11.5–15.5)
WBC: 8.2 10*3/uL (ref 4.0–10.5)
nRBC: 0 % (ref 0.0–0.2)

## 2020-12-10 LAB — URINALYSIS, DIPSTICK ONLY
Bilirubin Urine: NEGATIVE
Glucose, UA: NEGATIVE mg/dL
Ketones, ur: NEGATIVE mg/dL
Nitrite: POSITIVE — AB
Protein, ur: 100 mg/dL — AB
Specific Gravity, Urine: 1.008 (ref 1.005–1.030)
pH: 8 (ref 5.0–8.0)

## 2020-12-10 MED ORDER — SODIUM CHLORIDE 0.9% FLUSH
10.0000 mL | Freq: Once | INTRAVENOUS | Status: DC
  Filled 2020-12-10: qty 10

## 2020-12-10 MED ORDER — PALONOSETRON HCL INJECTION 0.25 MG/5ML
0.2500 mg | Freq: Once | INTRAVENOUS | Status: AC
  Administered 2020-12-10: 0.25 mg via INTRAVENOUS
  Filled 2020-12-10: qty 5

## 2020-12-10 MED ORDER — SODIUM CHLORIDE 0.9 % IV SOLN
10.0000 mg/kg | Freq: Once | INTRAVENOUS | Status: AC
  Administered 2020-12-10: 600 mg via INTRAVENOUS
  Filled 2020-12-10: qty 16

## 2020-12-10 MED ORDER — OXALIPLATIN CHEMO INJECTION 100 MG/20ML
85.0000 mg/m2 | Freq: Once | INTRAVENOUS | Status: AC
  Administered 2020-12-10: 135 mg via INTRAVENOUS
  Filled 2020-12-10: qty 10

## 2020-12-10 MED ORDER — DEXTROSE 5 % IV SOLN
Freq: Once | INTRAVENOUS | Status: AC
  Filled 2020-12-10: qty 250

## 2020-12-10 MED ORDER — SODIUM CHLORIDE 0.9 % IV SOLN
2400.0000 mg/m2 | INTRAVENOUS | Status: DC
  Administered 2020-12-10: 3750 mg via INTRAVENOUS
  Filled 2020-12-10: qty 75

## 2020-12-10 MED ORDER — LEUCOVORIN CALCIUM INJECTION 350 MG
650.0000 mg | Freq: Once | INTRAVENOUS | Status: AC
  Administered 2020-12-10: 650 mg via INTRAVENOUS
  Filled 2020-12-10: qty 32.5

## 2020-12-10 MED ORDER — FLUOROURACIL CHEMO INJECTION 2.5 GM/50ML
400.0000 mg/m2 | Freq: Once | INTRAVENOUS | Status: AC
  Administered 2020-12-10: 650 mg via INTRAVENOUS
  Filled 2020-12-10: qty 13

## 2020-12-10 MED ORDER — SODIUM CHLORIDE 0.9 % IV SOLN
Freq: Once | INTRAVENOUS | Status: AC
  Filled 2020-12-10: qty 250

## 2020-12-10 MED ORDER — DEXAMETHASONE SODIUM PHOSPHATE 100 MG/10ML IJ SOLN
10.0000 mg | Freq: Once | INTRAMUSCULAR | Status: AC
  Administered 2020-12-10: 10 mg via INTRAVENOUS
  Filled 2020-12-10: qty 10

## 2020-12-10 MED ORDER — HEPARIN SOD (PORK) LOCK FLUSH 100 UNIT/ML IV SOLN
500.0000 [IU] | Freq: Once | INTRAVENOUS | Status: DC
  Filled 2020-12-10: qty 5

## 2020-12-10 NOTE — Patient Instructions (Signed)
[facility]  Discharge Instructions: Thank you for choosing [facility] to provide your oncology and hematology care.  If you have a lab appointment with the [facility], please go directly to the [facility] and check in at the registration area.  Wear comfortable clothing and clothing appropriate for easy access to any Portacath or PICC line.   We strive to give you quality time with your provider. You may need to reschedule your appointment if you arrive late (15 or more minutes).  Arriving late affects you and other patients whose appointments are after yours.  Also, if you miss three or more appointments without notifying the office, you may be dismissed from the clinic at the provider's discretion.      For prescription refill requests, have your pharmacy contact our office and allow 72 hours for refills to be completed.    Today you received the following chemotherapy and/or immunotherapy agents : Folfox, Avastin   To help prevent nausea and vomiting after your treatment, we encourage you to take your nausea medication as directed.  BELOW ARE SYMPTOMS THAT SHOULD BE REPORTED IMMEDIATELY: . *FEVER GREATER THAN 100.4 F (38 C) OR HIGHER . *CHILLS OR SWEATING . *NAUSEA AND VOMITING THAT IS NOT CONTROLLED WITH YOUR NAUSEA MEDICATION . *UNUSUAL SHORTNESS OF BREATH . *UNUSUAL BRUISING OR BLEEDING . *URINARY PROBLEMS (pain or burning when urinating, or frequent urination) . *BOWEL PROBLEMS (unusual diarrhea, constipation, pain near the anus) . TENDERNESS IN MOUTH AND THROAT WITH OR WITHOUT PRESENCE OF ULCERS (sore throat, sores in mouth, or a toothache) . UNUSUAL RASH, SWELLING OR PAIN  . UNUSUAL VAGINAL DISCHARGE OR ITCHING   Items with * indicate a potential emergency and should be followed up as soon as possible or go to the Emergency Department if any problems should occur.  Please show the CHEMOTHERAPY ALERT CARD or IMMUNOTHERAPY  ALERT CARD at check-in to the Emergency Department and triage nurse.  Should you have questions after your visit or need to cancel or reschedule your appointment, please contact [facility]  [phone] and follow the prompts.  Office hours are 8:00 a.m. to 4:30 p.m. Monday - Friday. Please note that voicemails left after 4:00 p.m. may not be returned until the following business day.  We are closed weekends and major holidays. You have access to a nurse at all times for urgent questions. Please call the main number to the clinic [phone] and follow the prompts.  For any non-urgent questions, you may also contact your provider using MyChart. We now offer e-Visits for anyone 53 and older to request care online for non-urgent symptoms. For details visit [email or web address].   Also download the MyChart app! Go to the app store, search "MyChart", open the app, select [facility], and log in with your MyChart username and password.  Due to Covid, a mask is required upon entering the hospital/clinic. If you do not have a mask, one will be given to you upon arrival. For doctor visits, patients may have 1 support person aged 53 or older with them. For treatment visits, patients cannot have anyone with them due to current Covid guidelines and our immunocompromised population.

## 2020-12-10 NOTE — Progress Notes (Signed)
Patient here for pre treatment check she reports that  her neuropathy is getting worse.

## 2020-12-10 NOTE — Progress Notes (Signed)
Nutrition Follow-up:  Patient with metastatic colorectal adenocarcinoma.  Patient receiving folfox and bevacizumab.    Met with patient during infusion.  Patient reports that she is eating.  Has stopped eating past 6pm due to indigestion, may have accounted for weight loss this time.  Says that she is eating protein foods (chicken, peanut butter, eggs, cheese) also incorporating vegetables and mostly canned fruit and juice.  Likes hot tea and water. Not drinking any nutrition supplements    Medications: reviewed  Labs: creatinine 1.32  Anthropometrics:   Weight 131 lb 9.6 oz today   134 lb on 4/27 133 lb on 2/23 127 lb on 1/19   NUTRITION DIAGNOSIS: Inadequate oral intake improving   INTERVENTION:  Discussed strategies to help with GERD.   Patient to continue to eat well balanced diet including good sources of protein    MONITORING, EVALUATION, GOAL: weight trends, intake   NEXT VISIT: as needed  Anita Sullivan B. Zenia Resides, [place], [place] Registered Dietitian [phone] (mobile)

## 2020-12-12 ENCOUNTER — Other Ambulatory Visit: Payer: Self-pay

## 2020-12-12 ENCOUNTER — Inpatient Hospital Stay: Payer: Medicaid Other

## 2020-12-12 VITALS — BP 95/72 | HR 74 | Resp 17

## 2020-12-12 DIAGNOSIS — C19 Malignant neoplasm of rectosigmoid junction: Secondary | ICD-10-CM

## 2020-12-12 MED ORDER — PEGFILGRASTIM-CBQV 6 MG/0.6ML ~~LOC~~ SOSY
6.0000 mg | PREFILLED_SYRINGE | Freq: Once | SUBCUTANEOUS | Status: AC
  Administered 2020-12-12: 6 mg via SUBCUTANEOUS
  Filled 2020-12-12: qty 0.6

## 2020-12-12 MED ORDER — HEPARIN SOD (PORK) LOCK FLUSH 100 UNIT/ML IV SOLN
500.0000 [IU] | Freq: Once | INTRAVENOUS | Status: AC | PRN
  Administered 2020-12-12: 500 [IU]
  Filled 2020-12-12: qty 5

## 2020-12-12 MED ORDER — SODIUM CHLORIDE 0.9% FLUSH
10.0000 mL | INTRAVENOUS | Status: DC | PRN
  Administered 2020-12-12: 10 mL
  Filled 2020-12-12: qty 10

## 2020-12-12 NOTE — Patient Instructions (Signed)
Pegfilgrastim injection What is this medicine? PEGFILGRASTIM (PEG fil gra stim) is a long-acting granulocyte colony-stimulating factor that stimulates the growth of neutrophils, a type of white blood cell important in the body's fight against infection. It is used to reduce the incidence of fever and infection in patients with certain types of cancer who are receiving chemotherapy that affects the bone marrow, and to increase survival after being exposed to high doses of radiation. This medicine may be used for other purposes; ask your health care provider or pharmacist if you have questions. COMMON BRAND NAME(S): Fulphila, Neulasta, Nyvepria, UDENYCA, Ziextenzo What should I tell my health care provider before I take this medicine? They need to know if you have any of these conditions:  kidney disease  latex allergy  ongoing radiation therapy  sickle cell disease  skin reactions to acrylic adhesives (On-Body Injector only)  an unusual or allergic reaction to pegfilgrastim, filgrastim, other medicines, foods, dyes, or preservatives  pregnant or trying to get pregnant  breast-feeding How should I use this medicine? This medicine is for injection under the skin. If you get this medicine at home, you will be taught how to prepare and give the pre-filled syringe or how to use the On-body Injector. Refer to the patient Instructions for Use for detailed instructions. Use exactly as directed. Tell your healthcare provider immediately if you suspect that the On-body Injector may not have performed as intended or if you suspect the use of the On-body Injector resulted in a missed or partial dose. It is important that you put your used needles and syringes in a special sharps container. Do not put them in a trash can. If you do not have a sharps container, call your pharmacist or healthcare provider to get one. Talk to your pediatrician regarding the use of this medicine in children. While this drug  may be prescribed for selected conditions, precautions do apply. Overdosage: If you think you have taken too much of this medicine contact a poison control center or emergency room at once. NOTE: This medicine is only for you. Do not share this medicine with others. What if I miss a dose? It is important not to miss your dose. Call your doctor or health care professional if you miss your dose. If you miss a dose due to an On-body Injector failure or leakage, a new dose should be administered as soon as possible using a single prefilled syringe for manual use. What may interact with this medicine? Interactions have not been studied. This list may not describe all possible interactions. Give your health care provider a list of all the medicines, herbs, non-prescription drugs, or dietary supplements you use. Also tell them if you smoke, drink alcohol, or use illegal drugs. Some items may interact with your medicine. What should I watch for while using this medicine? Your condition will be monitored carefully while you are receiving this medicine. You may need blood work done while you are taking this medicine. Talk to your health care provider about your risk of cancer. You may be more at risk for certain types of cancer if you take this medicine. If you are going to need a MRI, CT scan, or other procedure, tell your doctor that you are using this medicine (On-Body Injector only). What side effects may I notice from receiving this medicine? Side effects that you should report to your doctor or health care professional as soon as possible:  allergic reactions (skin rash, itching or hives, swelling of   the face, lips, or tongue)  back pain  dizziness  fever  pain, redness, or irritation at site where injected  pinpoint red spots on the skin  red or dark-brown urine  shortness of breath or breathing problems  stomach or side pain, or pain at the shoulder  swelling  tiredness  trouble  passing urine or change in the amount of urine  unusual bruising or bleeding Side effects that usually do not require medical attention (report to your doctor or health care professional if they continue or are bothersome):  bone pain  muscle pain This list may not describe all possible side effects. Call your doctor for medical advice about side effects. You may report side effects to FDA at 1-800-FDA-1088. Where should I keep my medicine? Keep out of the reach of children. If you are using this medicine at home, you will be instructed on how to store it. Throw away any unused medicine after the expiration date on the label. NOTE: This sheet is a summary. It may not cover all possible information. If you have questions about this medicine, talk to your doctor, pharmacist, or health care provider.  2021 Elsevier/Gold Standard (2019-08-10 13:20:51)  

## 2021-01-09 ENCOUNTER — Ambulatory Visit
Admission: RE | Admit: 2021-01-09 | Discharge: 2021-01-09 | Disposition: A | Discharge status: Home / Self Care | Payer: Medicaid Other | Source: Ambulatory Visit | Attending: Oncology | Admitting: Oncology

## 2021-01-09 DIAGNOSIS — K7689 Other specified diseases of liver: Secondary | ICD-10-CM | POA: Diagnosis not present

## 2021-01-09 DIAGNOSIS — N1330 Unspecified hydronephrosis: Secondary | ICD-10-CM | POA: Diagnosis not present

## 2021-01-09 DIAGNOSIS — C19 Malignant neoplasm of rectosigmoid junction: Secondary | ICD-10-CM | POA: Diagnosis not present

## 2021-01-09 DIAGNOSIS — D259 Leiomyoma of uterus, unspecified: Secondary | ICD-10-CM | POA: Diagnosis not present

## 2021-01-09 DIAGNOSIS — J189 Pneumonia, unspecified organism: Secondary | ICD-10-CM | POA: Diagnosis not present

## 2021-01-09 MED ORDER — IOHEXOL 350 MG/ML SOLN: 75.0000 mL | Freq: Once | INTRAVENOUS | Status: DC | PRN

## 2021-01-09 MED ORDER — IOHEXOL 300 MG/ML  SOLN
75.0000 mL | Freq: Once | INTRAMUSCULAR | Status: AC | PRN
  Administered 2021-01-09: 75 mL via INTRAVENOUS

## 2021-01-09 NOTE — Progress Notes (Deleted)
[place]  Telephone:(336) [phone] Fax:(336) [phone]  ID: [name] OB: 1966/05/07  MR#: [id number]  [id number]#:[id number]  Patient Care Team: [name], NP as PCP - General (Nurse Practitioner) [name], MD as Consulting Physician (Oncology) [name], RN as Oncology Nurse Navigator  CHIEF COMPLAINT: Metastatic colorectal adenocarcinoma, KRAS wild-type  INTERVAL HISTORY: Patient returns to clinic today for further evaluation and consideration of cycle 12 of FOLFOX plus Avastin.  She has a nonpainful peripheral neuropathy in her hands and feet that is slightly worse, but does not affect her day-to-day activity.  She otherwise feels well and is tolerating her treatments without significant side effects.  She has no other neurologic complaints.  She denies any weakness or fatigue. She no longer has rectal bleeding. She has a good appetite and denies weight loss.  She denies any chest pain, shortness of breath, cough, or hemoptysis.  She denies any nausea, vomiting, constipation, or diarrhea.  She has no urinary complaints.  Patient offers no further specific complaints today.  REVIEW OF SYSTEMS:   Review of Systems  Constitutional: Negative.  Negative for fever, malaise/fatigue and weight loss.  HENT: Negative.  Negative for congestion.   Respiratory: Negative.  Negative for cough, hemoptysis and shortness of breath.   Cardiovascular: Negative.  Negative for chest pain and leg swelling.  Gastrointestinal:  Negative for blood in stool, constipation, nausea and vomiting.  Genitourinary: Negative.  Negative for flank pain and hematuria.  Musculoskeletal: Negative.  Negative for back pain.  Skin: Negative.  Negative for rash.  Neurological:  Positive for tingling and sensory change. Negative for dizziness, seizures, weakness and headaches.  Psychiatric/Behavioral: Negative.  The patient is not nervous/anxious.    As per HPI. Otherwise, a  complete review of systems is negative.  PAST MEDICAL HISTORY: Past Medical History:  Diagnosis Date   Colon cancer ([place])    Family history of pancreatic cancer    Family history of uterine cancer     PAST SURGICAL HISTORY: Past Surgical History:  Procedure Laterality Date   COLONOSCOPY WITH PROPOFOL N/A 05/27/2020   Procedure: COLONOSCOPY WITH PROPOFOL;  Surgeon: [name], MD;  Location: [facility] ENDOSCOPY;  Service: Gastroenterology;  Laterality: N/A;   FLEXIBLE SIGMOIDOSCOPY N/A 05/28/2020   Procedure: FLEXIBLE SIGMOIDOSCOPY;  Surgeon: [name], MD;  Location: [facility] ENDOSCOPY;  Service: Gastroenterology;  Laterality: N/A;   IR NEPHROSTOMY EXCHANGE LEFT  07/11/2020   IR NEPHROSTOMY EXCHANGE RIGHT  07/11/2020   IR NEPHROSTOMY PLACEMENT LEFT  05/23/2020   IR NEPHROSTOMY PLACEMENT RIGHT  05/23/2020   IR URETERAL STENT LEFT NEW ACCESS W/O SEP NEPHROSTOMY CATH  08/15/2020   IR URETERAL STENT RIGHT NEW ACCESS W/O SEP NEPHROSTOMY CATH  08/15/2020   PORTACATH PLACEMENT Left 05/29/2020   Procedure: INSERTION PORT-A-CATH;  Surgeon: [name], MD;  Location: [facility] ORS;  Service: General;  Laterality: Left;    FAMILY HISTORY: Family History  Problem Relation Age of Onset   Other Mother        Corticobasal degeneration   Uterine cancer Mother    Diabetes Maternal Grandmother    Stroke Maternal Grandfather    Stroke Paternal Grandfather    Heart disease Father    Pancreatic cancer Maternal Uncle 17   Cancer Paternal Aunt        unk type   Liver cancer Maternal Uncle    Pancreatic cancer Cousin    Cancer Cousin        unk type  ADVANCED DIRECTIVES (Y/N):  N  HEALTH MAINTENANCE: Social History   Tobacco Use   Smoking status: Never   Smokeless tobacco: Never  Vaping Use   Vaping Use: Never used  Substance Use Topics   Alcohol use: Never   Drug use: Never     Colonoscopy:  PAP:  Bone density:  Lipid panel:  No Known Allergies  Current Outpatient  Medications  Medication Sig Dispense Refill   acetaminophen (TYLENOL) 325 MG tablet Take 650 mg by mouth every 6 (six) hours as needed.     HYDROcodone-acetaminophen (NORCO) 5-325 MG tablet Take 1 tablet by mouth every 6 (six) hours as needed for moderate pain. 30 tablet 0   lidocaine-prilocaine (EMLA) cream Apply 1 application topically as needed. Apply prior to appointment. Cover with plastic wrap. 30 g 2   loratadine (CLARITIN) 10 MG tablet Take 10 mg by mouth daily.     ondansetron (ZOFRAN) 4 MG tablet Take 1 tablet (4 mg total) by mouth every 8 (eight) hours as needed for nausea or vomiting. 30 tablet 2   pantoprazole (PROTONIX) 40 MG tablet Take 1 tablet (40 mg total) by mouth 2 (two) times daily. 60 tablet 0   No current facility-administered medications for this visit.    OBJECTIVE: There were no vitals filed for this visit.    There is no height or weight on file to calculate BMI.    ECOG FS:0 - Asymptomatic  General: Well-developed, well-nourished, no acute distress. Eyes: Pink conjunctiva, anicteric sclera. HEENT: Normocephalic, moist mucous membranes. Lungs: No audible wheezing or coughing. Heart: Regular rate and rhythm. Abdomen: Soft, nontender, no obvious distention. Musculoskeletal: No edema, cyanosis, or clubbing. Neuro: Alert, answering all questions appropriately. Cranial nerves grossly intact. Skin: No rashes or petechiae noted. Psych: Normal affect.   LAB RESULTS:  Lab Results  Component Value Date   NA 136 12/10/2020   K 3.6 12/10/2020   CL 106 12/10/2020   CO2 22 12/10/2020   GLUCOSE 87 12/10/2020   BUN 16 12/10/2020   CREATININE 1.32 (H) 12/10/2020   CALCIUM 8.5 (L) 12/10/2020   PROT 7.1 12/10/2020   ALBUMIN 3.3 (L) 12/10/2020   AST 38 12/10/2020   ALT 20 12/10/2020   ALKPHOS 152 (H) 12/10/2020   BILITOT 0.5 12/10/2020   GFRNONAA 49 (L) 12/10/2020    Lab Results  Component Value Date   WBC 8.2 12/10/2020   NEUTROABS 5.2 12/10/2020   HGB 9.9  (L) 12/10/2020   HCT 31.2 (L) 12/10/2020   MCV 104.3 (H) 12/10/2020   PLT 127 (L) 12/10/2020     STUDIES: CT CHEST ABDOMEN PELVIS W CONTRAST  Result Date: 01/09/2021 CLINICAL DATA:  Metastatic colorectal cancer restaging, assess treatment response, ongoing chemotherapy, former smoker EXAM: CT CHEST, ABDOMEN, AND PELVIS WITH CONTRAST TECHNIQUE: Multidetector CT imaging of the chest, abdomen and pelvis was performed following the standard protocol during bolus administration of intravenous contrast. CONTRAST:  60m OMNIPAQUE IOHEXOL 300 MG/ML SOLN, additional oral enteric contrast COMPARISON:  09/19/2020 FINDINGS: CT CHEST FINDINGS Cardiovascular: Left chest port catheter. Normal heart size. No pericardial effusion. Mediastinum/Nodes: No enlarged mediastinal, hilar, or axillary lymph nodes. Thyroid gland, trachea, and esophagus demonstrate no significant findings. Lungs/Pleura: Improvement in dense consolidation previously seen in the dependent right lower lobe, with residual heterogeneous and ground-glass airspace opacity as well as bandlike elements of scarring and or atelectasis. The disease no pleural effusion or pneumothorax. Musculoskeletal: No chest wall mass or suspicious bone lesions identified. CT ABDOMEN PELVIS FINDINGS Hepatobiliary:  Unchanged hypodense liver lesions of the central liver dome, posteromedial right lobe, and inferior right lobe, index lesion of the inferior right lobe, hepatic segment VI, measuring 2.1 x 2.0 cm (series 2, image 62). Additional lesion of the left lobe of the liver measuring 0.8 cm is not definitely seen on prior examination, possibly due to slice selection (series 2, image 51). No gallstones, gallbladder wall thickening, or biliary dilatation. Pancreas: Unremarkable. No pancreatic ductal dilatation or surrounding inflammatory changes. Spleen: Normal in size without significant abnormality. Adrenals/Urinary Tract: Adrenal glands are unremarkable. Bilateral double-J  ureteral stents remain in position with formed pigtails in the renal pelves and urinary bladder. Mild residual bilateral hydronephrosis, not significantly changed. Bladder is unremarkable. Stomach/Bowel: Stomach is within normal limits. Appendix appears normal. Unchanged post treatment appearance of a mass of the rectosigmoid junction with adjacent perirectal and presacral fat stranding and nodularity. Perirectal lymph nodes measuring up to 0.8 cm unchanged (series 2, image 112). Vascular/Lymphatic: No significant vascular findings are present. Unchanged post treatment appearance of retroperitoneal and iliac lymph nodes, with residual fat stranding but without persistent abnormally enlarged lymph nodes (series 2, image 76). Reproductive: Uterine fibroids. Other: No abdominal wall hernia or abnormality. No abdominopelvic ascites. Musculoskeletal: No acute or significant osseous findings. IMPRESSION: 1. Unchanged post treatment appearance of a mass of the rectosigmoid junction with adjacent perirectal and presacral fat stranding and nodularity. Perirectal lymph nodes measuring up to 0.8 cm unchanged. 2. Unchanged hypodense metastatic lesions of the right lobe of the liver. 3. Additional lesion of the left lobe of the liver measuring 0.8 cm is not definitely seen on prior examination, possibly due to slice selection. Attention on follow-up. 4. Unchanged post treatment appearance of retroperitoneal and iliac lymph nodes, without persistent abnormally enlarged lymph nodes. 5. Bilateral double-J ureteral stents remain in position with formed pigtails in the renal pelves and urinary bladder. Mild residual bilateral hydronephrosis, not significantly changed. 6. Improvement in dense consolidation previously seen in the dependent right lower lobe, with residual heterogeneous and ground-glass airspace opacity as well as bandlike elements of scarring and or atelectasis. Findings are consistent with improved sequelae of prior  infection or aspiration, which may however be ongoing. 7. Uterine fibroids. Electronically Signed   By: Eddie Candle M.D.   On: 01/09/2021 20:16    ASSESSMENT: Metastatic colorectal adenocarcinoma,  KRAS wild-type.  PLAN:   1. Metastatic colorectal adenocarcinoma: Although unclear whether malignancy originated in colon or rectum, given the pattern of spread to liver, will treat as a stage IV colon cancer.  CEA was only mildly elevated at 9.9.  Patient has had port placement. Initial plan was to treat with FOLFOX plus Avastin every 2 weeks.  Can consider Panitumumab as second line.  Avastin was held for the first 6 cycles secondary to persistent rectal bleeding, but this is now resolved.  CT scan results from September 03, 2020 reviewed independently with significant improvement of patient's disease burden.  Proceed with cycle 12 of FOLFOX plus Avastin today.  Return to clinic in 2 days for pump removal.  Repeat CT scan in 4 weeks and then patient will follow-up 1 to 2 days later to discuss the results and determine whether additional treatment is necessary. Appreciate palliative care and genetics input.   2.  Anemia: Chronic and unchanged.  Patient's hemoglobin is 9.9 today. 3.  Renal insufficiency: Creatinine is trended up slightly to 1.32, monitor. 4.  Neutropenia: Resolved.  Udenyca with pump removal as above. 5.  Nephrostomy tubes: Internalized  on August 15, 2020. 6.  Thrombocytopenia: Chronic and unchanged.  Patient platelet count is 127.  Proceed with treatment as above. 7.  Peripheral neuropathy: Slightly worse today.  Patient will get a slight chemo holiday as above.  If her neuropathy becomes worse or painful will consider referral to neuro oncology.  Patient expressed understanding and was in agreement with this plan. She also understands that She can call clinic at any time with any questions, concerns, or complaints.   Cancer Staging Metastatic colorectal cancer [facility]) Staging form: Colon  and Rectum, AJCC 8th Edition - Clinical stage from 05/31/2020: Stage IVC (cTX, cNX, pM1c) - Signed by [name], MD on 05/31/2020   [name], MD   01/09/2021 10:29 PM

## 2021-01-13 ENCOUNTER — Inpatient Hospital Stay: Payer: Medicaid Other

## 2021-01-13 ENCOUNTER — Inpatient Hospital Stay: Payer: Medicaid Other | Admitting: Oncology

## 2021-01-24 NOTE — Progress Notes (Signed)
[place]  Telephone:(336) [phone] Fax:(336) [phone]  ID: [name] OB: Sep 20, 1965  MR#: [id number]  RXV#:[id number]  Patient Care Team: [name], NP as PCP - General (Nurse Practitioner) [name], MD as Consulting Physician (Oncology) [name], RN as Oncology Nurse Navigator  CHIEF COMPLAINT: Metastatic colorectal adenocarcinoma, KRAS wild-type  INTERVAL HISTORY: Patient returns to clinic today for further evaluation and discussion of her imaging results.  She continues to have a peripheral neuropathy that is unchanged and does not affect her day-to-day activity.  She otherwise feels well.  She has no other neurologic complaints. She denies any weakness or fatigue. She no longer has rectal bleeding. She has a good appetite and denies weight loss.  She denies any chest pain, shortness of breath, cough, or hemoptysis.  She denies any nausea, vomiting, constipation, or diarrhea.  She has no urinary complaints.  Patient offers no further specific complaints today.  REVIEW OF SYSTEMS:   Review of Systems  Constitutional: Negative.  Negative for fever, malaise/fatigue and weight loss.  HENT: Negative.  Negative for congestion.   Respiratory: Negative.  Negative for cough, hemoptysis and shortness of breath.   Cardiovascular: Negative.  Negative for chest pain and leg swelling.  Gastrointestinal:  Negative for blood in stool, constipation, nausea and vomiting.  Genitourinary: Negative.  Negative for flank pain and hematuria.  Musculoskeletal: Negative.  Negative for back pain.  Skin: Negative.  Negative for rash.  Neurological:  Positive for tingling and sensory change. Negative for dizziness, seizures, weakness and headaches.  Psychiatric/Behavioral: Negative.  The patient is not nervous/anxious.    As per HPI. Otherwise, a complete review of systems is negative.  PAST MEDICAL HISTORY: Past Medical History:  Diagnosis Date    Colon cancer ([place])    Family history of pancreatic cancer    Family history of uterine cancer     PAST SURGICAL HISTORY: Past Surgical History:  Procedure Laterality Date   COLONOSCOPY WITH PROPOFOL N/A 05/27/2020   Procedure: COLONOSCOPY WITH PROPOFOL;  Surgeon: [name], MD;  Location: [facility] ENDOSCOPY;  Service: Gastroenterology;  Laterality: N/A;   FLEXIBLE SIGMOIDOSCOPY N/A 05/28/2020   Procedure: FLEXIBLE SIGMOIDOSCOPY;  Surgeon: [name], MD;  Location: [facility] ENDOSCOPY;  Service: Gastroenterology;  Laterality: N/A;   IR NEPHROSTOMY EXCHANGE LEFT  07/11/2020   IR NEPHROSTOMY EXCHANGE RIGHT  07/11/2020   IR NEPHROSTOMY PLACEMENT LEFT  05/23/2020   IR NEPHROSTOMY PLACEMENT RIGHT  05/23/2020   IR URETERAL STENT LEFT NEW ACCESS W/O SEP NEPHROSTOMY CATH  08/15/2020   IR URETERAL STENT RIGHT NEW ACCESS W/O SEP NEPHROSTOMY CATH  08/15/2020   PORTACATH PLACEMENT Left 05/29/2020   Procedure: INSERTION PORT-A-CATH;  Surgeon: [name], MD;  Location: [facility] ORS;  Service: General;  Laterality: Left;    FAMILY HISTORY: Family History  Problem Relation Age of Onset   Other Mother        Corticobasal degeneration   Uterine cancer Mother    Diabetes Maternal Grandmother    Stroke Maternal Grandfather    Stroke Paternal Grandfather    Heart disease Father    Pancreatic cancer Maternal Uncle 51   Cancer Paternal Aunt        unk type   Liver cancer Maternal Uncle    Pancreatic cancer Cousin    Cancer Cousin        unk type    ADVANCED DIRECTIVES (Y/N):  N  HEALTH MAINTENANCE: Social History   Tobacco Use   Smoking  status: Never   Smokeless tobacco: Never  Vaping Use   Vaping Use: Never used  Substance Use Topics   Alcohol use: Never   Drug use: Never     Colonoscopy:  PAP:  Bone density:  Lipid panel:  No Known Allergies  Current Outpatient Medications  Medication Sig Dispense Refill   acetaminophen (TYLENOL) 325 MG tablet Take 650 mg by mouth  every 6 (six) hours as needed.     HYDROcodone-acetaminophen (NORCO) 5-325 MG tablet Take 1 tablet by mouth every 6 (six) hours as needed for moderate pain. 30 tablet 0   lidocaine-prilocaine (EMLA) cream Apply 1 application topically as needed. Apply prior to appointment. Cover with plastic wrap. 30 g 2   loratadine (CLARITIN) 10 MG tablet Take 10 mg by mouth daily.     ondansetron (ZOFRAN) 4 MG tablet Take 1 tablet (4 mg total) by mouth every 8 (eight) hours as needed for nausea or vomiting. 30 tablet 2   pantoprazole (PROTONIX) 40 MG tablet Take 1 tablet (40 mg total) by mouth 2 (two) times daily. 60 tablet 0   No current facility-administered medications for this visit.    OBJECTIVE: Vitals:   01/28/21 0956  BP: 94/79  Pulse: 84  Resp: 20  Temp: (!) 97.3 F (36.3 C)     Body mass index is 26.62 kg/m.    ECOG FS:0 - Asymptomatic  General: Well-developed, well-nourished, no acute distress. Eyes: Pink conjunctiva, anicteric sclera. HEENT: Normocephalic, moist mucous membranes. Lungs: No audible wheezing or coughing. Heart: Regular rate and rhythm. Abdomen: Soft, nontender, no obvious distention. Musculoskeletal: No edema, cyanosis, or clubbing. Neuro: Alert, answering all questions appropriately. Cranial nerves grossly intact. Skin: No rashes or petechiae noted. Psych: Normal affect.   LAB RESULTS:  Lab Results  Component Value Date   NA 137 01/28/2021   K 4.3 01/28/2021   CL 105 01/28/2021   CO2 24 01/28/2021   GLUCOSE 100 (H) 01/28/2021   BUN 23 (H) 01/28/2021   CREATININE 1.38 (H) 01/28/2021   CALCIUM 8.8 (L) 01/28/2021   PROT 7.3 01/28/2021   ALBUMIN 3.3 (L) 01/28/2021   AST 69 (H) 01/28/2021   ALT 50 (H) 01/28/2021   ALKPHOS 194 (H) 01/28/2021   BILITOT 0.7 01/28/2021   GFRNONAA 46 (L) 01/28/2021    Lab Results  Component Value Date   WBC 4.8 01/28/2021   NEUTROABS 3.0 01/28/2021   HGB 11.6 (L) 01/28/2021   HCT 37.1 01/28/2021   MCV 108.5 (H)  01/28/2021   PLT 124 (L) 01/28/2021     STUDIES: CT CHEST ABDOMEN PELVIS W CONTRAST  Result Date: 01/09/2021 CLINICAL DATA:  Metastatic colorectal cancer restaging, assess treatment response, ongoing chemotherapy, former smoker EXAM: CT CHEST, ABDOMEN, AND PELVIS WITH CONTRAST TECHNIQUE: Multidetector CT imaging of the chest, abdomen and pelvis was performed following the standard protocol during bolus administration of intravenous contrast. CONTRAST:  23m OMNIPAQUE IOHEXOL 300 MG/ML SOLN, additional oral enteric contrast COMPARISON:  09/19/2020 FINDINGS: CT CHEST FINDINGS Cardiovascular: Left chest port catheter. Normal heart size. No pericardial effusion. Mediastinum/Nodes: No enlarged mediastinal, hilar, or axillary lymph nodes. Thyroid gland, trachea, and esophagus demonstrate no significant findings. Lungs/Pleura: Improvement in dense consolidation previously seen in the dependent right lower lobe, with residual heterogeneous and ground-glass airspace opacity as well as bandlike elements of scarring and or atelectasis. The disease no pleural effusion or pneumothorax. Musculoskeletal: No chest wall mass or suspicious bone lesions identified. CT ABDOMEN PELVIS FINDINGS Hepatobiliary: Unchanged hypodense liver lesions of  the central liver dome, posteromedial right lobe, and inferior right lobe, index lesion of the inferior right lobe, hepatic segment VI, measuring 2.1 x 2.0 cm (series 2, image 62). Additional lesion of the left lobe of the liver measuring 0.8 cm is not definitely seen on prior examination, possibly due to slice selection (series 2, image 51). No gallstones, gallbladder wall thickening, or biliary dilatation. Pancreas: Unremarkable. No pancreatic ductal dilatation or surrounding inflammatory changes. Spleen: Normal in size without significant abnormality. Adrenals/Urinary Tract: Adrenal glands are unremarkable. Bilateral double-J ureteral stents remain in position with formed pigtails in  the renal pelves and urinary bladder. Mild residual bilateral hydronephrosis, not significantly changed. Bladder is unremarkable. Stomach/Bowel: Stomach is within normal limits. Appendix appears normal. Unchanged post treatment appearance of a mass of the rectosigmoid junction with adjacent perirectal and presacral fat stranding and nodularity. Perirectal lymph nodes measuring up to 0.8 cm unchanged (series 2, image 112). Vascular/Lymphatic: No significant vascular findings are present. Unchanged post treatment appearance of retroperitoneal and iliac lymph nodes, with residual fat stranding but without persistent abnormally enlarged lymph nodes (series 2, image 76). Reproductive: Uterine fibroids. Other: No abdominal wall hernia or abnormality. No abdominopelvic ascites. Musculoskeletal: No acute or significant osseous findings. IMPRESSION: 1. Unchanged post treatment appearance of a mass of the rectosigmoid junction with adjacent perirectal and presacral fat stranding and nodularity. Perirectal lymph nodes measuring up to 0.8 cm unchanged. 2. Unchanged hypodense metastatic lesions of the right lobe of the liver. 3. Additional lesion of the left lobe of the liver measuring 0.8 cm is not definitely seen on prior examination, possibly due to slice selection. Attention on follow-up. 4. Unchanged post treatment appearance of retroperitoneal and iliac lymph nodes, without persistent abnormally enlarged lymph nodes. 5. Bilateral double-J ureteral stents remain in position with formed pigtails in the renal pelves and urinary bladder. Mild residual bilateral hydronephrosis, not significantly changed. 6. Improvement in dense consolidation previously seen in the dependent right lower lobe, with residual heterogeneous and ground-glass airspace opacity as well as bandlike elements of scarring and or atelectasis. Findings are consistent with improved sequelae of prior infection or aspiration, which may however be ongoing. 7.  Uterine fibroids. Electronically Signed   By: Eddie Candle M.D.   On: 01/09/2021 20:16    ASSESSMENT: Metastatic colorectal adenocarcinoma,  KRAS wild-type.  PLAN:   1. Metastatic colorectal adenocarcinoma: Although unclear whether malignancy originated in colon or rectum, given the pattern of spread to liver, will treat as a stage IV colon cancer.  Initial CEA was only mildly elevated at 9.9.  Patient has had port placement. Initial plan was to treat with FOLFOX plus Avastin every 2 weeks.  Can consider Panitumumab as second line.  Avastin was held for the first 6 cycles secondary to persistent rectal bleeding.  Patient completed 12 cycles on Dec 12, 2020. CT results from January 09, 2021 reviewed independently and reported as above with essentially stable disease. No additional treatment is need at this time, but patient expressed understanding that she will likely require additional chemotherapy in the future.  Return to clinic in 2 months with repeat imagining and further evaluation.  2.  Anemia: Improved. Patient's hemoglobin is 11.6 today. 3.  Renal insufficiency: Chronic and unchanged.  Patient's creatinine is 1.38 today.  Monitor.  4.  Neutropenia: Resolved. Patient required periodic Udenyca with her treatments. 5.  Nephrostomy tubes: Internalized on August 15, 2020. 6.  Thrombocytopenia: Chronic and unchanged.  Patient platelet count is 124 today. 7.  Peripheral  neuropathy: Chronic and unchanged. If her neuropathy becomes worse or painful will consider referral to neuro oncology. 8.  Elevated LFTs: Mild, monitor.  Patient expressed understanding and was in agreement with this plan. She also understands that She can call clinic at any time with any questions, concerns, or complaints.   Cancer Staging Metastatic colorectal cancer [facility]) Staging form: Colon and Rectum, AJCC 8th Edition - Clinical stage from 05/31/2020: Stage IVC (cTX, cNX, pM1c) - Signed by [name], MD on  05/31/2020   [name], MD   01/30/2021 7:52 AM

## 2021-01-28 ENCOUNTER — Inpatient Hospital Stay (HOSPITAL_BASED_OUTPATIENT_CLINIC_OR_DEPARTMENT_OTHER): Payer: Medicaid Other | Admitting: Oncology

## 2021-01-28 ENCOUNTER — Encounter: Payer: Self-pay | Admitting: Oncology

## 2021-01-28 ENCOUNTER — Inpatient Hospital Stay: Payer: Medicaid Other | Attending: Oncology

## 2021-01-28 ENCOUNTER — Other Ambulatory Visit: Payer: Self-pay

## 2021-01-28 VITALS — BP 94/79 | HR 84 | Temp 97.3°F | Resp 20 | Wt 136.3 lb

## 2021-01-28 DIAGNOSIS — Z8049 Family history of malignant neoplasm of other genital organs: Secondary | ICD-10-CM | POA: Diagnosis not present

## 2021-01-28 DIAGNOSIS — J984 Other disorders of lung: Secondary | ICD-10-CM | POA: Diagnosis not present

## 2021-01-28 DIAGNOSIS — Z833 Family history of diabetes mellitus: Secondary | ICD-10-CM | POA: Insufficient documentation

## 2021-01-28 DIAGNOSIS — R7989 Other specified abnormal findings of blood chemistry: Secondary | ICD-10-CM | POA: Diagnosis not present

## 2021-01-28 DIAGNOSIS — Z823 Family history of stroke: Secondary | ICD-10-CM | POA: Diagnosis not present

## 2021-01-28 DIAGNOSIS — Z800 Family history of malignant neoplasm of digestive organs: Secondary | ICD-10-CM | POA: Diagnosis not present

## 2021-01-28 DIAGNOSIS — Z79899 Other long term (current) drug therapy: Secondary | ICD-10-CM | POA: Diagnosis not present

## 2021-01-28 DIAGNOSIS — N1330 Unspecified hydronephrosis: Secondary | ICD-10-CM | POA: Insufficient documentation

## 2021-01-28 DIAGNOSIS — Z809 Family history of malignant neoplasm, unspecified: Secondary | ICD-10-CM | POA: Diagnosis not present

## 2021-01-28 DIAGNOSIS — C19 Malignant neoplasm of rectosigmoid junction: Secondary | ICD-10-CM | POA: Diagnosis not present

## 2021-01-28 DIAGNOSIS — C787 Secondary malignant neoplasm of liver and intrahepatic bile duct: Secondary | ICD-10-CM | POA: Diagnosis not present

## 2021-01-28 DIAGNOSIS — G629 Polyneuropathy, unspecified: Secondary | ICD-10-CM | POA: Insufficient documentation

## 2021-01-28 DIAGNOSIS — D649 Anemia, unspecified: Secondary | ICD-10-CM | POA: Diagnosis not present

## 2021-01-28 DIAGNOSIS — D259 Leiomyoma of uterus, unspecified: Secondary | ICD-10-CM | POA: Insufficient documentation

## 2021-01-28 DIAGNOSIS — Z8249 Family history of ischemic heart disease and other diseases of the circulatory system: Secondary | ICD-10-CM | POA: Diagnosis not present

## 2021-01-28 DIAGNOSIS — D696 Thrombocytopenia, unspecified: Secondary | ICD-10-CM | POA: Insufficient documentation

## 2021-01-28 LAB — CBC WITH DIFFERENTIAL/PLATELET
Abs Immature Granulocytes: 0.01 10*3/uL (ref 0.00–0.07)
Basophils Absolute: 0 10*3/uL (ref 0.0–0.1)
Basophils Relative: 0 %
Eosinophils Absolute: 0.1 10*3/uL (ref 0.0–0.5)
Eosinophils Relative: 3 %
HCT: 37.1 % (ref 36.0–46.0)
Hemoglobin: 11.6 g/dL — ABNORMAL LOW (ref 12.0–15.0)
Immature Granulocytes: 0 %
Lymphocytes Relative: 29 %
Lymphs Abs: 1.4 10*3/uL (ref 0.7–4.0)
MCH: 33.9 pg (ref 26.0–34.0)
MCHC: 31.3 g/dL (ref 30.0–36.0)
MCV: 108.5 fL — ABNORMAL HIGH (ref 80.0–100.0)
Monocytes Absolute: 0.3 10*3/uL (ref 0.1–1.0)
Monocytes Relative: 7 %
Neutro Abs: 3 10*3/uL (ref 1.7–7.7)
Neutrophils Relative %: 61 %
Platelets: 124 10*3/uL — ABNORMAL LOW (ref 150–400)
RBC: 3.42 MIL/uL — ABNORMAL LOW (ref 3.87–5.11)
RDW: 14.8 % (ref 11.5–15.5)
WBC: 4.8 10*3/uL (ref 4.0–10.5)
nRBC: 0 % (ref 0.0–0.2)

## 2021-01-28 LAB — COMPREHENSIVE METABOLIC PANEL
ALT: 50 U/L — ABNORMAL HIGH (ref 0–44)
AST: 69 U/L — ABNORMAL HIGH (ref 15–41)
Albumin: 3.3 g/dL — ABNORMAL LOW (ref 3.5–5.0)
Alkaline Phosphatase: 194 U/L — ABNORMAL HIGH (ref 38–126)
Anion gap: 8 (ref 5–15)
BUN: 23 mg/dL — ABNORMAL HIGH (ref 6–20)
CO2: 24 mmol/L (ref 22–32)
Calcium: 8.8 mg/dL — ABNORMAL LOW (ref 8.9–10.3)
Chloride: 105 mmol/L (ref 98–111)
Creatinine, Ser: 1.38 mg/dL — ABNORMAL HIGH (ref 0.44–1.00)
GFR, Estimated: 46 mL/min — ABNORMAL LOW (ref >60)
Glucose, Bld: 100 mg/dL — ABNORMAL HIGH (ref 70–99)
Potassium: 4.3 mmol/L (ref 3.5–5.1)
Sodium: 137 mmol/L (ref 135–145)
Total Bilirubin: 0.7 mg/dL (ref 0.3–1.2)
Total Protein: 7.3 g/dL (ref 6.5–8.1)

## 2021-01-28 NOTE — Progress Notes (Signed)
Patient here today for follow up regarding colon cancer. Patient reports numbness to feel, legs and hands.

## 2021-01-29 DIAGNOSIS — C19 Malignant neoplasm of rectosigmoid junction: Secondary | ICD-10-CM | POA: Diagnosis not present

## 2021-01-29 LAB — CEA: CEA: 2.5 ng/mL (ref 0.0–4.7)

## 2021-01-30 ENCOUNTER — Encounter: Payer: Self-pay | Admitting: Oncology

## 2021-02-13 ENCOUNTER — Inpatient Hospital Stay: Payer: Medicaid Other | Attending: Oncology

## 2021-02-13 ENCOUNTER — Other Ambulatory Visit: Payer: Self-pay

## 2021-02-13 DIAGNOSIS — C19 Malignant neoplasm of rectosigmoid junction: Secondary | ICD-10-CM | POA: Insufficient documentation

## 2021-02-13 DIAGNOSIS — Z452 Encounter for adjustment and management of vascular access device: Secondary | ICD-10-CM | POA: Insufficient documentation

## 2021-02-13 MED ORDER — HEPARIN SOD (PORK) LOCK FLUSH 100 UNIT/ML IV SOLN
INTRAVENOUS | Status: AC
  Filled 2021-02-13: qty 5

## 2021-02-13 MED ORDER — SODIUM CHLORIDE 0.9% FLUSH
10.0000 mL | Freq: Once | INTRAVENOUS | Status: AC
  Administered 2021-02-13: 10 mL via INTRAVENOUS
  Filled 2021-02-13: qty 10

## 2021-02-13 MED ORDER — HEPARIN SOD (PORK) LOCK FLUSH 100 UNIT/ML IV SOLN
500.0000 [IU] | Freq: Once | INTRAVENOUS | Status: AC
  Administered 2021-02-13: 500 [IU] via INTRAVENOUS
  Filled 2021-02-13: qty 5

## 2021-03-02 DIAGNOSIS — C19 Malignant neoplasm of rectosigmoid junction: Secondary | ICD-10-CM | POA: Diagnosis not present

## 2021-03-12 ENCOUNTER — Encounter: Payer: Self-pay | Admitting: Oncology

## 2021-03-27 ENCOUNTER — Inpatient Hospital Stay: Payer: Medicaid Other

## 2021-03-27 ENCOUNTER — Ambulatory Visit: Payer: Medicaid Other

## 2021-03-27 DIAGNOSIS — C19 Malignant neoplasm of rectosigmoid junction: Secondary | ICD-10-CM | POA: Diagnosis not present

## 2021-03-31 ENCOUNTER — Ambulatory Visit: Payer: Medicaid Other | Admitting: Oncology

## 2021-04-09 ENCOUNTER — Ambulatory Visit: Payer: Medicaid Other

## 2021-04-09 ENCOUNTER — Other Ambulatory Visit: Payer: Medicaid Other

## 2021-04-11 NOTE — Progress Notes (Signed)
[place]  [phone] [phone]  ID: Anita Sullivan OB: Jun 02, 1966  MR#: [id number]  BPP#:[id number]  Patient Care Team: [name], NP as PCP - General (Nurse Practitioner) [name], MD as Consulting Physician (Oncology) [name], RN as Oncology Nurse Navigator  CHIEF COMPLAINT: Metastatic colorectal adenocarcinoma, KRAS wild-type  INTERVAL HISTORY: Patient returns to clinic today for further evaluation and discussion of her imaging results.  She has noticed an increase in lower back and pelvic pain recently, but otherwise has felt well.  She has a mild peripheral neuropathy, but no other neurologic complaints.  She denies any weakness or fatigue. She no longer has rectal bleeding. She has a good appetite and denies weight loss.  She denies any chest pain, shortness of breath, cough, or hemoptysis.  She denies any nausea, vomiting, constipation, or diarrhea.  She has no urinary complaints.  Patient offers no further specific complaints today.  REVIEW OF SYSTEMS:   Review of Systems  Constitutional: Negative.  Negative for fever, malaise/fatigue and weight loss.  HENT: Negative.  Negative for congestion.   Respiratory: Negative.  Negative for cough, hemoptysis and shortness of breath.   Cardiovascular: Negative.  Negative for chest pain and leg swelling.  Gastrointestinal:  Negative for blood in stool, constipation, nausea and vomiting.  Genitourinary: Negative.  Negative for flank pain and hematuria.  Musculoskeletal: Negative.  Negative for back pain.  Skin: Negative.  Negative for rash.  Neurological:  Positive for tingling and sensory change. Negative for dizziness, seizures, weakness and headaches.  Psychiatric/Behavioral: Negative.  The patient is not nervous/anxious.    As per HPI. Otherwise, a complete review of systems is negative.  PAST MEDICAL HISTORY: Past Medical History:  Diagnosis Date   Colon  cancer ([place])    Family history of pancreatic cancer    Family history of uterine cancer     PAST SURGICAL HISTORY: Past Surgical History:  Procedure Laterality Date   COLONOSCOPY WITH PROPOFOL N/A 05/27/2020   Procedure: COLONOSCOPY WITH PROPOFOL;  Surgeon: [name], MD;  Location: [facility] ENDOSCOPY;  Service: Gastroenterology;  Laterality: N/A;   FLEXIBLE SIGMOIDOSCOPY N/A 05/28/2020   Procedure: FLEXIBLE SIGMOIDOSCOPY;  Surgeon: [name], MD;  Location: [facility] ENDOSCOPY;  Service: Gastroenterology;  Laterality: N/A;   IR NEPHROSTOMY EXCHANGE LEFT  07/11/2020   IR NEPHROSTOMY EXCHANGE RIGHT  07/11/2020   IR NEPHROSTOMY PLACEMENT LEFT  05/23/2020   IR NEPHROSTOMY PLACEMENT RIGHT  05/23/2020   IR URETERAL STENT LEFT NEW ACCESS W/O SEP NEPHROSTOMY CATH  08/15/2020   IR URETERAL STENT RIGHT NEW ACCESS W/O SEP NEPHROSTOMY CATH  08/15/2020   PORTACATH PLACEMENT Left 05/29/2020   Procedure: INSERTION PORT-A-CATH;  Surgeon: [name], MD;  Location: [facility] ORS;  Service: General;  Laterality: Left;    FAMILY HISTORY: Family History  Problem Relation Age of Onset   Other Mother        Corticobasal degeneration   Uterine cancer Mother    Diabetes Maternal Grandmother    Stroke Maternal Grandfather    Stroke Paternal Grandfather    Heart disease Father    Pancreatic cancer Maternal Uncle 56   Cancer Paternal Aunt        unk type   Liver cancer Maternal Uncle    Pancreatic cancer Cousin    Cancer Cousin        unk type    ADVANCED DIRECTIVES (Y/N):  N  HEALTH MAINTENANCE: Social History   Tobacco Use  Smoking status: Never   Smokeless tobacco: Never  Vaping Use   Vaping Use: Never used  Substance Use Topics   Alcohol use: Never   Drug use: Never     Colonoscopy:  PAP:  Bone density:  Lipid panel:  No Known Allergies  Current Outpatient Medications  Medication Sig Dispense Refill   acetaminophen (TYLENOL) 325 MG tablet Take 650 mg by mouth every  6 (six) hours as needed.     lidocaine-prilocaine (EMLA) cream Apply 1 application topically as needed. Apply prior to appointment. Cover with plastic wrap. 30 g 2   loratadine (CLARITIN) 10 MG tablet Take 10 mg by mouth daily.     ondansetron (ZOFRAN) 4 MG tablet Take 1 tablet (4 mg total) by mouth every 8 (eight) hours as needed for nausea or vomiting. 30 tablet 2   pantoprazole (PROTONIX) 40 MG tablet Take 1 tablet (40 mg total) by mouth 2 (two) times daily. 60 tablet 0   HYDROcodone-acetaminophen (NORCO) 5-325 MG tablet Take 1 tablet by mouth every 6 (six) hours as needed for moderate pain. 60 tablet 0   No current facility-administered medications for this visit.    OBJECTIVE: Vitals:   04/15/21 0959  BP: 102/69  Pulse: 75  Resp: 18  Temp: (!) 96.5 F (35.8 C)  SpO2: 99%     Body mass index is 28.01 kg/m.    ECOG FS:0 - Asymptomatic  General: Well-developed, well-nourished, no acute distress. Eyes: Pink conjunctiva, anicteric sclera. HEENT: Normocephalic, moist mucous membranes. Lungs: No audible wheezing or coughing. Heart: Regular rate and rhythm. Abdomen: Soft, nontender, no obvious distention. Musculoskeletal: No edema, cyanosis, or clubbing. Neuro: Alert, answering all questions appropriately. Cranial nerves grossly intact. Skin: No rashes or petechiae noted. Psych: Normal affect.  LAB RESULTS:  Lab Results  Component Value Date   NA 138 04/13/2021   K 4.2 04/13/2021   CL 104 04/13/2021   CO2 26 04/13/2021   GLUCOSE 89 04/13/2021   BUN 21 (H) 04/13/2021   CREATININE 1.16 (H) 04/13/2021   CALCIUM 8.6 (L) 04/13/2021   PROT 7.5 04/13/2021   ALBUMIN 3.6 04/13/2021   AST 30 04/13/2021   ALT 22 04/13/2021   ALKPHOS 140 (H) 04/13/2021   BILITOT 0.4 04/13/2021   GFRNONAA 56 (L) 04/13/2021    Lab Results  Component Value Date   WBC 7.3 04/13/2021   NEUTROABS 4.3 04/13/2021   HGB 12.7 04/13/2021   HCT 38.9 04/13/2021   MCV 98.5 04/13/2021   PLT 160  04/13/2021     STUDIES: CT CHEST ABDOMEN PELVIS W CONTRAST  Result Date: 04/14/2021 CLINICAL DATA:  Metastatic colorectal cancer restaging EXAM: CT CHEST, ABDOMEN, AND PELVIS WITH CONTRAST TECHNIQUE: Multidetector CT imaging of the chest, abdomen and pelvis was performed following the standard protocol during bolus administration of intravenous contrast. CONTRAST:  44m OMNIPAQUE IOHEXOL 350 MG/ML SOLN, additional oral enteric contrast COMPARISON:  01/09/2021 FINDINGS: CT CHEST FINDINGS Cardiovascular: Left chest port catheter. Normal heart size. No pericardial effusion. Mediastinum/Nodes: No enlarged mediastinal, hilar, or axillary lymph nodes. Thyroid gland, trachea, and esophagus demonstrate no significant findings. Lungs/Pleura: Further interval resolution of heterogeneous and ground-glass airspace opacity in the dependent right lung, with residual irregular subpleural scarring and fibrosis (series 4, image 73). No pleural effusion or pneumothorax. Musculoskeletal: No chest wall mass or suspicious bone lesions identified. CT ABDOMEN PELVIS FINDINGS Hepatobiliary: Slight interval enlargement of a hypodense lesion of the inferior tip of the right lobe of the liver, measuring 2.8 x 2.6  cm, previously 2.1 x 2.0 cm (series 2, image 62), as well as a lesion of the liver dome, hepatic segment VII, measuring 2.5 x 2.4 cm, previously 2.1 x 1.8 cm (series 2, image 41) and of the left lobe of the liver measuring 1.0 cm, previously 0.8 cm (series 2, image 52). No gallstones, gallbladder wall thickening, or biliary dilatation. Pancreas: Unremarkable. No pancreatic ductal dilatation or surrounding inflammatory changes. Spleen: Normal in size without significant abnormality. Adrenals/Urinary Tract: Adrenal glands are unremarkable. Bilateral double-J ureteral stent catheters remain in position, with formed pigtails in the renal pelves and urinary bladder. There is moderate left hydronephrosis, increased compared to prior  examination, and mild right hydronephrosis, not significantly changed. Bladder is unremarkable. Stomach/Bowel: Stomach is within normal limits. Appendix appears normal. Unchanged post treatment appearance of pelvis, with a treated mass of the rectosigmoid junction, with adjacent perirectal and presacral fat stranding as well as enlarged perirectal lymph nodes, again measuring up to 0.8 cm (series 2, image 112). Vascular/Lymphatic: No significant vascular findings are present. No enlarged abdominal or pelvic lymph nodes. Reproductive: Uterine fibroids. Other: No abdominal wall hernia or abnormality. No abdominopelvic ascites. Musculoskeletal: No acute or significant osseous findings. IMPRESSION: 1. Slight interval enlargement of hypodense liver lesions, consist with worsened hepatic metastatic disease. 2. Unchanged post treatment appearance of the pelvis, with a treated mass of the rectosigmoid junction, and adjacent perirectal and presacral fat stranding as well as enlarged perirectal lymph nodes. 3. Bilateral double-J ureteral stent catheters remain in position, with formed pigtails in the renal pelves and urinary bladder. There is moderate left hydronephrosis, increased compared to prior examination, and mild right hydronephrosis, not significantly changed. 4. Further interval resolution of heterogeneous and ground-glass airspace opacity in the dependent right lung, with residual irregular subpleural scarring and fibrosis, consistent with resolving infection or aspiration, which may be recurrent or ongoing. 5. Uterine fibroids. Electronically Signed   By: Eddie Candle M.D.   On: 04/14/2021 15:16    ASSESSMENT: Metastatic colorectal adenocarcinoma,  KRAS wild-type.  PLAN:   1. Metastatic colorectal adenocarcinoma: Although unclear whether malignancy originated in colon or rectum, given the pattern of spread to liver, will treat as a stage IV colon cancer.  Initial CEA was only mildly elevated at 9.9.  Patient  has had port placement. Initial plan was to treat with FOLFOX plus Avastin every 2 weeks.  Can consider Panitumumab as second line.  Avastin was held for the first 6 cycles secondary to persistent rectal bleeding.  Patient completed 12 cycles on Dec 12, 2020.  CT scan results from April 14, 2021 reviewed independently and reported as above with mild progression of disease of 2 known lesions in her liver.  There appears to be no other evidence of disease.  We will further discuss case with interventional radiology to see if localized treatment with Y 90 or ablation is appropriate.  CEA is pending at time of dictation.  Follow-up will be based on discussion with interventional radiology.  Of note, patient has declined pursuing colonoscopy. 2.  Anemia: Resolved. 3.  Renal insufficiency: Essentially resolved.   4.  Neutropenia: Resolved. Patient required periodic Udenyca with her treatments. 5.  Nephrostomy tubes: Internalized on August 15, 2020. 6.  Thrombocytopenia: Resolved. 7.  Peripheral neuropathy: Chronic and unchanged. Consider referral to neuro oncology. 8.  Elevated LFTs: Resolved. 9.  Pain: Patient was given a refill of her hydrocodone today.  Patient expressed understanding and was in agreement with this plan. She also understands that  She can call clinic at any time with any questions, concerns, or complaints.   Cancer Staging Metastatic colorectal cancer [facility]) Staging form: Colon and Rectum, AJCC 8th Edition - Clinical stage from 05/31/2020: Stage IVC (cTX, cNX, pM1c) - Signed by [name], MD on 05/31/2020   [name], MD   04/16/2021 11:54 AM

## 2021-04-13 ENCOUNTER — Inpatient Hospital Stay: Payer: Medicaid Other | Attending: Oncology

## 2021-04-13 ENCOUNTER — Other Ambulatory Visit: Payer: Self-pay

## 2021-04-13 DIAGNOSIS — C19 Malignant neoplasm of rectosigmoid junction: Secondary | ICD-10-CM | POA: Insufficient documentation

## 2021-04-13 DIAGNOSIS — C787 Secondary malignant neoplasm of liver and intrahepatic bile duct: Secondary | ICD-10-CM | POA: Insufficient documentation

## 2021-04-13 DIAGNOSIS — N1330 Unspecified hydronephrosis: Secondary | ICD-10-CM | POA: Insufficient documentation

## 2021-04-13 DIAGNOSIS — Z8049 Family history of malignant neoplasm of other genital organs: Secondary | ICD-10-CM | POA: Diagnosis not present

## 2021-04-13 DIAGNOSIS — Z809 Family history of malignant neoplasm, unspecified: Secondary | ICD-10-CM | POA: Insufficient documentation

## 2021-04-13 DIAGNOSIS — Z8249 Family history of ischemic heart disease and other diseases of the circulatory system: Secondary | ICD-10-CM | POA: Insufficient documentation

## 2021-04-13 DIAGNOSIS — G629 Polyneuropathy, unspecified: Secondary | ICD-10-CM | POA: Diagnosis not present

## 2021-04-13 DIAGNOSIS — Z833 Family history of diabetes mellitus: Secondary | ICD-10-CM | POA: Diagnosis not present

## 2021-04-13 DIAGNOSIS — Z800 Family history of malignant neoplasm of digestive organs: Secondary | ICD-10-CM | POA: Diagnosis not present

## 2021-04-13 DIAGNOSIS — D259 Leiomyoma of uterus, unspecified: Secondary | ICD-10-CM | POA: Insufficient documentation

## 2021-04-13 DIAGNOSIS — R599 Enlarged lymph nodes, unspecified: Secondary | ICD-10-CM | POA: Diagnosis not present

## 2021-04-13 DIAGNOSIS — Z823 Family history of stroke: Secondary | ICD-10-CM | POA: Diagnosis not present

## 2021-04-13 DIAGNOSIS — Z95828 Presence of other vascular implants and grafts: Secondary | ICD-10-CM

## 2021-04-13 LAB — COMPREHENSIVE METABOLIC PANEL
ALT: 22 U/L (ref 0–44)
AST: 30 U/L (ref 15–41)
Albumin: 3.6 g/dL (ref 3.5–5.0)
Alkaline Phosphatase: 140 U/L — ABNORMAL HIGH (ref 38–126)
Anion gap: 8 (ref 5–15)
BUN: 21 mg/dL — ABNORMAL HIGH (ref 6–20)
CO2: 26 mmol/L (ref 22–32)
Calcium: 8.6 mg/dL — ABNORMAL LOW (ref 8.9–10.3)
Chloride: 104 mmol/L (ref 98–111)
Creatinine, Ser: 1.16 mg/dL — ABNORMAL HIGH (ref 0.44–1.00)
GFR, Estimated: 56 mL/min — ABNORMAL LOW (ref >60)
Glucose, Bld: 89 mg/dL (ref 70–99)
Potassium: 4.2 mmol/L (ref 3.5–5.1)
Sodium: 138 mmol/L (ref 135–145)
Total Bilirubin: 0.4 mg/dL (ref 0.3–1.2)
Total Protein: 7.5 g/dL (ref 6.5–8.1)

## 2021-04-13 LAB — CBC WITH DIFFERENTIAL/PLATELET
Abs Immature Granulocytes: 0.01 10*3/uL (ref 0.00–0.07)
Basophils Absolute: 0 10*3/uL (ref 0.0–0.1)
Basophils Relative: 0 %
Eosinophils Absolute: 0.1 10*3/uL (ref 0.0–0.5)
Eosinophils Relative: 2 %
HCT: 38.9 % (ref 36.0–46.0)
Hemoglobin: 12.7 g/dL (ref 12.0–15.0)
Immature Granulocytes: 0 %
Lymphocytes Relative: 30 %
Lymphs Abs: 2.2 10*3/uL (ref 0.7–4.0)
MCH: 32.2 pg (ref 26.0–34.0)
MCHC: 32.6 g/dL (ref 30.0–36.0)
MCV: 98.5 fL (ref 80.0–100.0)
Monocytes Absolute: 0.6 10*3/uL (ref 0.1–1.0)
Monocytes Relative: 8 %
Neutro Abs: 4.3 10*3/uL (ref 1.7–7.7)
Neutrophils Relative %: 60 %
Platelets: 160 10*3/uL (ref 150–400)
RBC: 3.95 MIL/uL (ref 3.87–5.11)
RDW: 13.3 % (ref 11.5–15.5)
WBC: 7.3 10*3/uL (ref 4.0–10.5)
nRBC: 0 % (ref 0.0–0.2)

## 2021-04-13 MED ORDER — SODIUM CHLORIDE 0.9% FLUSH
10.0000 mL | Freq: Once | INTRAVENOUS | Status: AC
  Administered 2021-04-13: 10 mL via INTRAVENOUS
  Filled 2021-04-13: qty 10

## 2021-04-13 MED ORDER — HEPARIN SOD (PORK) LOCK FLUSH 100 UNIT/ML IV SOLN
500.0000 [IU] | Freq: Once | INTRAVENOUS | Status: AC
  Administered 2021-04-13: 500 [IU] via INTRAVENOUS
  Filled 2021-04-13: qty 5

## 2021-04-13 MED ORDER — HEPARIN SOD (PORK) LOCK FLUSH 100 UNIT/ML IV SOLN
INTRAVENOUS | Status: AC
  Filled 2021-04-13: qty 5

## 2021-04-14 ENCOUNTER — Ambulatory Visit
Admission: RE | Admit: 2021-04-14 | Discharge: 2021-04-14 | Disposition: A | Discharge status: Home / Self Care | Payer: Medicaid Other | Source: Ambulatory Visit | Attending: Oncology | Admitting: Oncology

## 2021-04-14 ENCOUNTER — Inpatient Hospital Stay: Payer: Medicaid Other | Admitting: Oncology

## 2021-04-14 DIAGNOSIS — N1330 Unspecified hydronephrosis: Secondary | ICD-10-CM | POA: Diagnosis not present

## 2021-04-14 DIAGNOSIS — R160 Hepatomegaly, not elsewhere classified: Secondary | ICD-10-CM | POA: Diagnosis not present

## 2021-04-14 DIAGNOSIS — C19 Malignant neoplasm of rectosigmoid junction: Secondary | ICD-10-CM | POA: Diagnosis not present

## 2021-04-14 DIAGNOSIS — K769 Liver disease, unspecified: Secondary | ICD-10-CM | POA: Diagnosis not present

## 2021-04-14 DIAGNOSIS — C189 Malignant neoplasm of colon, unspecified: Secondary | ICD-10-CM | POA: Diagnosis not present

## 2021-04-14 DIAGNOSIS — D259 Leiomyoma of uterus, unspecified: Secondary | ICD-10-CM | POA: Diagnosis not present

## 2021-04-14 MED ORDER — IOHEXOL 350 MG/ML SOLN
150.0000 mL | Freq: Once | INTRAVENOUS | Status: AC | PRN
  Administered 2021-04-14: 85 mL via INTRAVENOUS

## 2021-04-15 ENCOUNTER — Inpatient Hospital Stay (HOSPITAL_BASED_OUTPATIENT_CLINIC_OR_DEPARTMENT_OTHER): Payer: Medicaid Other | Admitting: Oncology

## 2021-04-15 VITALS — BP 102/69 | HR 75 | Temp 96.5°F | Resp 18 | Wt 143.4 lb

## 2021-04-15 DIAGNOSIS — C19 Malignant neoplasm of rectosigmoid junction: Secondary | ICD-10-CM | POA: Diagnosis not present

## 2021-04-15 NOTE — Progress Notes (Signed)
Pt c/o lower abd/back pain and achy legs which she states is similar to the pain prior to cancer dx. Pt has noticed small amounts of blood in stool 3x/week and states intermittent "fever" of up to 99.6 for 2 weeks. Also requesting refill on hydrocodone.

## 2021-04-16 MED ORDER — HYDROCODONE-ACETAMINOPHEN 5-325 MG PO TABS: 1.0000 | ORAL_TABLET | Freq: Four times a day (QID) | ORAL | 0 refills | Status: DC | PRN

## 2021-04-22 ENCOUNTER — Telehealth: Payer: Self-pay | Admitting: *Deleted

## 2021-04-22 NOTE — Telephone Encounter (Addendum)
Call received from [name] patient disability company requesting information as to patient ability of lack of to return to work or any work or is she permanently disabled. Referred to letter from Feb 2022. Please return her call.    September 24, 2020    Patient: Anita Sullivan  Date of Birth: 1965-08-27  Date of Visit: 09/24/2020      To Whom It May Concern:   It is my medical opinion that Manda Holstad will need to be out of work for an indefinite time period, due to the current Chemotherapy treatment and side effects.   If you have any questions or concerns, please don't hesitate to call.       Sincerely,       Delight Hoh, MD

## 2021-04-23 ENCOUNTER — Other Ambulatory Visit: Payer: Self-pay | Admitting: Emergency Medicine

## 2021-04-23 ENCOUNTER — Other Ambulatory Visit: Payer: Self-pay | Admitting: Oncology

## 2021-04-23 ENCOUNTER — Inpatient Hospital Stay: Payer: Medicaid Other

## 2021-04-23 DIAGNOSIS — C189 Malignant neoplasm of colon, unspecified: Secondary | ICD-10-CM

## 2021-04-23 DIAGNOSIS — C19 Malignant neoplasm of rectosigmoid junction: Secondary | ICD-10-CM

## 2021-04-23 NOTE — Progress Notes (Deleted)
Placed order for IR per MD. Anita Sullivan with IR scheduler regarding referral for y-90 ablation of liver lesions.

## 2021-04-23 NOTE — Progress Notes (Signed)
Tumor Board Documentation  Anita Sullivan was presented by Dr Grayland Ormond at our Tumor Board on 04/23/2021, which included representatives from medical oncology, surgical, pharmacy, pulmonology, radiology, pathology, nutrition, radiation oncology, navigation, research, internal medicine, palliative care.  Anita Sullivan currently presents as a current patient, for discussion, for progression with history of the following treatments: adjuvant chemotherapy, active survellience.  Additionally, we reviewed previous medical and familial history, history of present illness, and recent lab results along with all available histopathologic and imaging studies. The tumor board considered available treatment options and made the following recommendations:   Refer to IR for possible Y 90 therapy  The following procedures/referrals were also placed: No orders of the defined types were placed in this encounter.   Clinical Trial Status: not discussed   Staging used: Pathologic Stage AJCC Staging: T: x N: x M: 1C Group: Stage IV C metastatic Colorectal Carcinoma   National site-specific guidelines NCCN were discussed with respect to the case.  Tumor board is a meeting of clinicians from various specialty areas who evaluate and discuss patients for whom a multidisciplinary approach is being considered. Final determinations in the plan of care are those of the provider(s). The responsibility for follow up of recommendations given during tumor board is that of the provider.   Today's extended care, comprehensive team conference, Anita Sullivan was not present for the discussion and was not examined.   Multidisciplinary Tumor Board is a multidisciplinary case peer review process.  Decisions discussed in the Multidisciplinary Tumor Board reflect the opinions of the specialists present at the conference without having examined the patient.  Ultimately, treatment and diagnostic decisions rest with the primary  provider(s) and the patient.

## 2021-04-23 NOTE — Telephone Encounter (Signed)
Contacted IR scheduling regarding y-90 ablation for liver lesions.

## 2021-04-28 ENCOUNTER — Ambulatory Visit
Admission: RE | Admit: 2021-04-28 | Discharge: 2021-04-28 | Disposition: A | Discharge status: Home / Self Care | Payer: Medicaid Other | Source: Ambulatory Visit | Attending: Oncology | Admitting: Oncology

## 2021-04-28 ENCOUNTER — Encounter: Payer: Self-pay | Admitting: *Deleted

## 2021-04-28 DIAGNOSIS — C787 Secondary malignant neoplasm of liver and intrahepatic bile duct: Secondary | ICD-10-CM | POA: Diagnosis not present

## 2021-04-28 DIAGNOSIS — C19 Malignant neoplasm of rectosigmoid junction: Secondary | ICD-10-CM | POA: Diagnosis not present

## 2021-04-28 DIAGNOSIS — C189 Malignant neoplasm of colon, unspecified: Secondary | ICD-10-CM

## 2021-04-28 HISTORY — PX: IR RADIOLOGIST EVAL & MGMT: IMG5224

## 2021-04-28 NOTE — Consult Note (Signed)
Chief Complaint: Patient was seen in consultation today for metastatic colorectal cancer  Referring Physician(s): [name],[name]  History of Present Illness: Anita Sullivan is a 55 y.o. 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  The lesion in segment VI (initially 6.9 cm on 05/22/20 CT) was biopsied on 05/26/20 and confirmed colorectal metastasis.  Her similar appearing segment VII dome lesion has similar imaging characteristics.  There is an additional hypoattenuating subcentimeter lesion in the left lobe of uncertain etiology, though more conspicuous now than on index CT.    She has undergone 12 cycles of FOLFLOX, some cycles with Avastin however it was stopped due to GI bleeding.  Her last chemotherapy was in May 2022.  Interval CT performed on 04/14/21 for re-staging demonstrated slight interval growth of the metastases in segments VI and VII.  She is overall feeling well.  No recent hospitalizations or change in her health.  She has a normal appetite.  No jaundice, recent GI bleed, fevers, chills, nausea/vomiting.    Past Medical History:  Diagnosis Date   Colon cancer ([place])    Family history of pancreatic cancer    Family history of uterine cancer     Past Surgical History:  Procedure Laterality Date   COLONOSCOPY WITH PROPOFOL N/A 05/27/2020   Procedure: COLONOSCOPY WITH PROPOFOL;  Surgeon: [name], MD;  Location: [facility] ENDOSCOPY;  Service: Gastroenterology;  Laterality: N/A;   FLEXIBLE SIGMOIDOSCOPY N/A 05/28/2020   Procedure: FLEXIBLE SIGMOIDOSCOPY;  Surgeon: [name], MD;  Location: [facility] ENDOSCOPY;  Service: Gastroenterology;  Laterality: N/A;   IR NEPHROSTOMY EXCHANGE LEFT  07/11/2020   IR NEPHROSTOMY EXCHANGE RIGHT  07/11/2020   IR NEPHROSTOMY PLACEMENT LEFT  05/23/2020   IR NEPHROSTOMY PLACEMENT RIGHT  05/23/2020   IR RADIOLOGIST EVAL & MGMT  04/28/2021   IR URETERAL STENT LEFT NEW  ACCESS W/O SEP NEPHROSTOMY CATH  08/15/2020   IR URETERAL STENT RIGHT NEW ACCESS W/O SEP NEPHROSTOMY CATH  08/15/2020   PORTACATH PLACEMENT Left 05/29/2020   Procedure: INSERTION PORT-A-CATH;  Surgeon: [name], MD;  Location: [facility] ORS;  Service: General;  Laterality: Left;    Allergies: Patient has no known allergies.  Medications: Prior to Admission medications   Medication Sig Start Date End Date Taking? Authorizing Provider  acetaminophen (TYLENOL) 325 MG tablet Take 650 mg by mouth every 6 (six) hours as needed.    [provider]  HYDROcodone-acetaminophen (NORCO) 5-325 MG tablet Take 1 tablet by mouth every 6 (six) hours as needed for moderate pain. 04/16/21   [name], MD  lidocaine-prilocaine (EMLA) cream Apply 1 application topically as needed. Apply prior to appointment. Cover with plastic wrap. 11/05/20   [name], MD  loratadine (CLARITIN) 10 MG tablet Take 10 mg by mouth daily.    [provider]  ondansetron (ZOFRAN) 4 MG tablet Take 1 tablet (4 mg total) by mouth every 8 (eight) hours as needed for nausea or vomiting. 11/05/20   [name], MD  pantoprazole (PROTONIX) 40 MG tablet Take 1 tablet (40 mg total) by mouth 2 (two) times daily. 11/05/20   [name], MD  prochlorperazine (COMPAZINE) 10 MG tablet Take 1 tablet (10 mg total) by mouth every 6 (six) hours as needed (Nausea or vomiting). 06/04/20 06/12/20  [name], MD     Family History  Problem Relation Age of Onset   Other Mother  Corticobasal degeneration   Uterine cancer Mother    Diabetes Maternal Grandmother    Stroke Maternal Grandfather    Stroke Paternal Grandfather    Heart disease Father    Pancreatic cancer Maternal Uncle 44   Cancer Paternal Aunt        unk type   Liver cancer Maternal Uncle    Pancreatic cancer Cousin    Cancer Cousin        unk type    Social History   Socioeconomic History   Marital status: Divorced     Spouse name: Not on file   Number of children: 1   Years of education: Not on file   Highest education level: Not on file  Occupational History   Not on file  Tobacco Use   Smoking status: Never   Smokeless tobacco: Never  Vaping Use   Vaping Use: Never used  Substance and Sexual Activity   Alcohol use: Never   Drug use: Never   Sexual activity: Not Currently  Other Topics Concern   Not on file  Social History Narrative   Lives with dad, 1 daughter and 2 dogs   Social Determinants of Health   Financial Resource Strain: Not on file  Food Insecurity: Not on file  Transportation Needs: Not on file  Physical Activity: Not on file  Stress: Not on file  Social Connections: Not on file    ECOG Status: 0 - Asymptomatic  Review of Systems: A 12 point ROS discussed and pertinent positives are indicated in the HPI above.  All other systems are negative.   Vital Signs: BP 113/65 (BP Location: Right Arm)   Pulse 60   SpO2 93%   Physical Exam Constitutional:      General: She is not in acute distress. HENT:     Head: Normocephalic.     Mouth/Throat:     Mouth: Mucous membranes are moist.  Eyes:     General: No scleral icterus. Cardiovascular:     Rate and Rhythm: Normal rate and regular rhythm.  Pulmonary:     Breath sounds: Normal breath sounds.  Abdominal:     General: There is no distension.  Musculoskeletal:     Right lower leg: No edema.     Left lower leg: No edema.  Skin:    General: Skin is warm and dry.     Coloration: Skin is not jaundiced.  Neurological:     Mental Status: She is alert and oriented to person, place, and time.    Imaging: CT AP 04/14/21  Seg VII, 2.5 cm   Seg VI, 2.8 cm    Labs:  CBC: Recent Labs    11/26/20 0817 12/10/20 0838 01/28/21 0947 04/13/21 0953  WBC 7.5 8.2 4.8 7.3  HGB 9.9* 9.9* 11.6* 12.7  HCT 31.2* 31.2* 37.1 38.9  PLT 128* 127* 124* 160    COAGS: Recent Labs    05/22/20 2313 05/26/20 0946  INR  1.2 1.1    BMP: Recent Labs    11/26/20 0817 12/10/20 0838 01/28/21 0947 04/13/21 0953  NA 138 136 137 138  K 3.9 3.6 4.3 4.2  CL 106 106 105 104  CO2 _0 GLUCOSE 94 87 100* 89  BUN 17 16 23* 21*  CALCIUM 8.8* 8.5* 8.8* 8.6*  CREATININE 1.22* 1.32* 1.38* 1.16*  GFRNONAA 53* 49* 46* 56*    LIVER FUNCTION TESTS: Recent Labs    11/26/20 0817 12/10/20 0838 01/28/21 0947 04/13/21  0953  BILITOT 0.4 0.5 0.7 0.4  AST 45* 38 69* 30  ALT 30 20 50* 22  ALKPHOS 118 152* 194* 140*  PROT 6.9 7.1 7.3 7.5  ALBUMIN 3.2* 3.3* 3.3* 3.6    TUMOR MARKERS: CEA 2.5 (01/28/21)   ALBI Grade 2 (-2.51 pts)    Assessment and Plan: 55 year old female with history of colorectal adenocarcinoma metastatic to the liver.  She has two discrete masses:  segment VI (subcapsular, 2.8 cm) and segment VII (dome, 2.5 cm).  There is an additional possible subcentemeter metastasis in the left lobe.  We discussed all methods of locoregional therapy offered by Interventional Radiology.  Given the location of her metastases and overall good health, I recommend split-dose segmentectomies of the respective right lobe masses.    Risks and benefits discussed with the patient including, but not limited to bleeding, infection, vascular injury, post procedural pain, nausea, vomiting and fatigue, contrast induced renal failure, liver failure, radiation injury to the bowel, radiation induced cholecystitis, neutropenia and possible need for additional procedures.  All of the patient's questions were answered, patient is agreeable to proceed.   Plan for hepatic angiogram with Tc-MAA administration at [facility] followed by transarterial radioembolization 2 weeks following.       Electronically Signed: Suzette Battiest, MD 04/28/2021, 2:27 PM   I spent a total of  60 Minutes in face to face in clinical consultation, greater than 50% of which was counseling/coordinating care for metastatic  colorectal cancer.

## 2021-04-29 DIAGNOSIS — C19 Malignant neoplasm of rectosigmoid junction: Secondary | ICD-10-CM | POA: Diagnosis not present

## 2021-05-06 ENCOUNTER — Telehealth: Payer: Self-pay | Admitting: Oncology

## 2021-05-06 NOTE — Telephone Encounter (Signed)
Voicemail left for patient to return call for more information regarding documentation she is needing.  Patient had an appt with IR on 04/28/21.

## 2021-05-06 NOTE — Telephone Encounter (Addendum)
Called pt back. Discussed questions and paperwork. Visit scheduled with Dr. Grayland Ormond to further discuss pt concerns regarding treatment plan

## 2021-05-06 NOTE — Telephone Encounter (Signed)
Pt left VM to request appt with Dr. Grayland Ormond to discuss a procedure and to have some documentation completed. Per LOS on 9/14, follow-up was TBD. Please advise.

## 2021-05-07 ENCOUNTER — Inpatient Hospital Stay: Payer: Medicaid Other | Attending: Oncology | Admitting: Oncology

## 2021-05-07 VITALS — BP 97/77 | HR 85 | Temp 97.9°F | Resp 18 | Wt 142.5 lb

## 2021-05-07 DIAGNOSIS — Z8249 Family history of ischemic heart disease and other diseases of the circulatory system: Secondary | ICD-10-CM | POA: Insufficient documentation

## 2021-05-07 DIAGNOSIS — N1330 Unspecified hydronephrosis: Secondary | ICD-10-CM | POA: Insufficient documentation

## 2021-05-07 DIAGNOSIS — C19 Malignant neoplasm of rectosigmoid junction: Secondary | ICD-10-CM | POA: Insufficient documentation

## 2021-05-07 DIAGNOSIS — R109 Unspecified abdominal pain: Secondary | ICD-10-CM | POA: Insufficient documentation

## 2021-05-07 DIAGNOSIS — Z800 Family history of malignant neoplasm of digestive organs: Secondary | ICD-10-CM | POA: Diagnosis not present

## 2021-05-07 DIAGNOSIS — Z8049 Family history of malignant neoplasm of other genital organs: Secondary | ICD-10-CM | POA: Diagnosis not present

## 2021-05-07 DIAGNOSIS — Z833 Family history of diabetes mellitus: Secondary | ICD-10-CM | POA: Insufficient documentation

## 2021-05-07 DIAGNOSIS — R599 Enlarged lymph nodes, unspecified: Secondary | ICD-10-CM | POA: Insufficient documentation

## 2021-05-07 DIAGNOSIS — C787 Secondary malignant neoplasm of liver and intrahepatic bile duct: Secondary | ICD-10-CM | POA: Diagnosis not present

## 2021-05-07 DIAGNOSIS — D259 Leiomyoma of uterus, unspecified: Secondary | ICD-10-CM | POA: Diagnosis not present

## 2021-05-07 DIAGNOSIS — Z809 Family history of malignant neoplasm, unspecified: Secondary | ICD-10-CM | POA: Diagnosis not present

## 2021-05-07 DIAGNOSIS — G629 Polyneuropathy, unspecified: Secondary | ICD-10-CM | POA: Diagnosis not present

## 2021-05-07 DIAGNOSIS — Z823 Family history of stroke: Secondary | ICD-10-CM | POA: Insufficient documentation

## 2021-05-07 NOTE — Progress Notes (Signed)
Pt has questions about treatment plan. No other concerns at the moment.

## 2021-05-07 NOTE — Telephone Encounter (Signed)
[employer] form completed and faxed.    P:  [phone] F:  [phone] Claim # [id number]

## 2021-05-07 NOTE — Progress Notes (Signed)
[name]  [phone] [phone]) [phone]  ID: Anita Sullivan OB: 1966/01/05  MR#: [id number]  IHK#:[id number]  Patient Care Team: [name], NP as PCP - General (Nurse Practitioner) [name], MD as Consulting Physician (Oncology) [name], RN as Oncology Nurse Navigator  CHIEF COMPLAINT: Metastatic colorectal adenocarcinoma, KRAS wild-type  INTERVAL HISTORY: Patient returns to clinic today as an add-on for further evaluation and discussion of her Y 85 ablation.  She has increased anxiety today.  She continues to have mild pelvic pain.  She has a mild peripheral neuropathy, but no other neurologic complaints.  She denies any weakness or fatigue. She no longer has rectal bleeding. She has a good appetite and denies weight loss.  She denies any chest pain, shortness of breath, cough, or hemoptysis.  She denies any nausea, vomiting, constipation, or diarrhea.  She has no urinary complaints.  Patient offers no further specific complaints today.  REVIEW OF SYSTEMS:   Review of Systems  Constitutional: Negative.  Negative for fever, malaise/fatigue and weight loss.  HENT: Negative.  Negative for congestion.   Respiratory: Negative.  Negative for cough, hemoptysis and shortness of breath.   Cardiovascular: Negative.  Negative for chest pain and leg swelling.  Gastrointestinal:  Positive for abdominal pain. Negative for blood in stool, constipation, nausea and vomiting.  Genitourinary: Negative.  Negative for flank pain and hematuria.  Musculoskeletal: Negative.  Negative for back pain.  Skin: Negative.  Negative for rash.  Neurological:  Positive for tingling and sensory change. Negative for dizziness, seizures, weakness and headaches.  Psychiatric/Behavioral:  The patient is nervous/anxious.    As per HPI. Otherwise, a complete review of systems is negative.  PAST MEDICAL HISTORY: Past Medical History:  Diagnosis Date   Colon  cancer ([place])    Family history of pancreatic cancer    Family history of uterine cancer     PAST SURGICAL HISTORY: Past Surgical History:  Procedure Laterality Date   COLONOSCOPY WITH PROPOFOL N/A 05/27/2020   Procedure: COLONOSCOPY WITH PROPOFOL;  Surgeon: [name], MD;  Location: [facility] ENDOSCOPY;  Service: Gastroenterology;  Laterality: N/A;   FLEXIBLE SIGMOIDOSCOPY N/A 05/28/2020   Procedure: FLEXIBLE SIGMOIDOSCOPY;  Surgeon: [name], MD;  Location: [facility] ENDOSCOPY;  Service: Gastroenterology;  Laterality: N/A;   IR NEPHROSTOMY EXCHANGE LEFT  07/11/2020   IR NEPHROSTOMY EXCHANGE RIGHT  07/11/2020   IR NEPHROSTOMY PLACEMENT LEFT  05/23/2020   IR NEPHROSTOMY PLACEMENT RIGHT  05/23/2020   IR RADIOLOGIST EVAL & MGMT  04/28/2021   IR URETERAL STENT LEFT NEW ACCESS W/O SEP NEPHROSTOMY CATH  08/15/2020   IR URETERAL STENT RIGHT NEW ACCESS W/O SEP NEPHROSTOMY CATH  08/15/2020   PORTACATH PLACEMENT Left 05/29/2020   Procedure: INSERTION PORT-A-CATH;  Surgeon: [name], MD;  Location: [facility] ORS;  Service: General;  Laterality: Left;    FAMILY HISTORY: Family History  Problem Relation Age of Onset   Other Mother        Corticobasal degeneration   Uterine cancer Mother    Diabetes Maternal Grandmother    Stroke Maternal Grandfather    Stroke Paternal Grandfather    Heart disease Father    Pancreatic cancer Maternal Uncle 32   Cancer Paternal Aunt        unk type   Liver cancer Maternal Uncle    Pancreatic cancer Cousin    Cancer Cousin        unk type    ADVANCED DIRECTIVES (Y/N):  N  HEALTH MAINTENANCE: Social History   Tobacco Use   Smoking status: Never   Smokeless tobacco: Never  Vaping Use   Vaping Use: Never used  Substance Use Topics   Alcohol use: Never   Drug use: Never     Colonoscopy:  PAP:  Bone density:  Lipid panel:  No Known Allergies  Current Outpatient Medications  Medication Sig Dispense Refill   acetaminophen (TYLENOL)  325 MG tablet Take 650 mg by mouth every 6 (six) hours as needed.     HYDROcodone-acetaminophen (NORCO) 5-325 MG tablet Take 1 tablet by mouth every 6 (six) hours as needed for moderate pain. 60 tablet 0   lidocaine-prilocaine (EMLA) cream Apply 1 application topically as needed. Apply prior to appointment. Cover with plastic wrap. 30 g 2   loratadine (CLARITIN) 10 MG tablet Take 10 mg by mouth daily.     ondansetron (ZOFRAN) 4 MG tablet Take 1 tablet (4 mg total) by mouth every 8 (eight) hours as needed for nausea or vomiting. 30 tablet 2   pantoprazole (PROTONIX) 40 MG tablet Take 1 tablet (40 mg total) by mouth 2 (two) times daily. 60 tablet 0   No current facility-administered medications for this visit.    OBJECTIVE: Vitals:   05/07/21 1016  BP: 97/77  Pulse: 85  Resp: 18  Temp: 97.9 F (36.6 C)  SpO2: 98%     Body mass index is 27.83 kg/m.    ECOG FS:0 - Asymptomatic  General: Well-developed, well-nourished, no acute distress. Eyes: Pink conjunctiva, anicteric sclera. HEENT: Normocephalic, moist mucous membranes. Lungs: No audible wheezing or coughing. Heart: Regular rate and rhythm. Abdomen: Soft, nontender, no obvious distention. Musculoskeletal: No edema, cyanosis, or clubbing. Neuro: Alert, answering all questions appropriately. Cranial nerves grossly intact. Skin: No rashes or petechiae noted. Psych: Normal affect.  LAB RESULTS:  Lab Results  Component Value Date   NA 138 04/13/2021   K 4.2 04/13/2021   CL 104 04/13/2021   CO2 26 04/13/2021   GLUCOSE 89 04/13/2021   BUN 21 (H) 04/13/2021   CREATININE 1.16 (H) 04/13/2021   CALCIUM 8.6 (L) 04/13/2021   PROT 7.5 04/13/2021   ALBUMIN 3.6 04/13/2021   AST 30 04/13/2021   ALT 22 04/13/2021   ALKPHOS 140 (H) 04/13/2021   BILITOT 0.4 04/13/2021   GFRNONAA 56 (L) 04/13/2021    Lab Results  Component Value Date   WBC 7.3 04/13/2021   NEUTROABS 4.3 04/13/2021   HGB 12.7 04/13/2021   HCT 38.9 04/13/2021    MCV 98.5 04/13/2021   PLT 160 04/13/2021     STUDIES: CT CHEST ABDOMEN PELVIS W CONTRAST  Result Date: 04/14/2021 CLINICAL DATA:  Metastatic colorectal cancer restaging EXAM: CT CHEST, ABDOMEN, AND PELVIS WITH CONTRAST TECHNIQUE: Multidetector CT imaging of the chest, abdomen and pelvis was performed following the standard protocol during bolus administration of intravenous contrast. CONTRAST:  51m OMNIPAQUE IOHEXOL 350 MG/ML SOLN, additional oral enteric contrast COMPARISON:  01/09/2021 FINDINGS: CT CHEST FINDINGS Cardiovascular: Left chest port catheter. Normal heart size. No pericardial effusion. Mediastinum/Nodes: No enlarged mediastinal, hilar, or axillary lymph nodes. Thyroid gland, trachea, and esophagus demonstrate no significant findings. Lungs/Pleura: Further interval resolution of heterogeneous and ground-glass airspace opacity in the dependent right lung, with residual irregular subpleural scarring and fibrosis (series 4, image 73). No pleural effusion or pneumothorax. Musculoskeletal: No chest wall mass or suspicious bone lesions identified. CT ABDOMEN PELVIS FINDINGS Hepatobiliary: Slight interval enlargement of a hypodense lesion of the inferior tip of the  right lobe of the liver, measuring 2.8 x 2.6 cm, previously 2.1 x 2.0 cm (series 2, image 62), as well as a lesion of the liver dome, hepatic segment VII, measuring 2.5 x 2.4 cm, previously 2.1 x 1.8 cm (series 2, image 41) and of the left lobe of the liver measuring 1.0 cm, previously 0.8 cm (series 2, image 52). No gallstones, gallbladder wall thickening, or biliary dilatation. Pancreas: Unremarkable. No pancreatic ductal dilatation or surrounding inflammatory changes. Spleen: Normal in size without significant abnormality. Adrenals/Urinary Tract: Adrenal glands are unremarkable. Bilateral double-J ureteral stent catheters remain in position, with formed pigtails in the renal pelves and urinary bladder. There is moderate left  hydronephrosis, increased compared to prior examination, and mild right hydronephrosis, not significantly changed. Bladder is unremarkable. Stomach/Bowel: Stomach is within normal limits. Appendix appears normal. Unchanged post treatment appearance of pelvis, with a treated mass of the rectosigmoid junction, with adjacent perirectal and presacral fat stranding as well as enlarged perirectal lymph nodes, again measuring up to 0.8 cm (series 2, image 112). Vascular/Lymphatic: No significant vascular findings are present. No enlarged abdominal or pelvic lymph nodes. Reproductive: Uterine fibroids. Other: No abdominal wall hernia or abnormality. No abdominopelvic ascites. Musculoskeletal: No acute or significant osseous findings. IMPRESSION: 1. Slight interval enlargement of hypodense liver lesions, consist with worsened hepatic metastatic disease. 2. Unchanged post treatment appearance of the pelvis, with a treated mass of the rectosigmoid junction, and adjacent perirectal and presacral fat stranding as well as enlarged perirectal lymph nodes. 3. Bilateral double-J ureteral stent catheters remain in position, with formed pigtails in the renal pelves and urinary bladder. There is moderate left hydronephrosis, increased compared to prior examination, and mild right hydronephrosis, not significantly changed. 4. Further interval resolution of heterogeneous and ground-glass airspace opacity in the dependent right lung, with residual irregular subpleural scarring and fibrosis, consistent with resolving infection or aspiration, which may be recurrent or ongoing. 5. Uterine fibroids. Electronically Signed   By: Eddie Candle M.D.   On: 04/14/2021 15:16   IR Radiologist Eval & Mgmt  Result Date: 04/28/2021 Please refer to notes tab for details about interventional procedure. (Op Note)   ASSESSMENT: Metastatic colorectal adenocarcinoma,  KRAS wild-type.  PLAN:   1. Metastatic colorectal adenocarcinoma: Although unclear  whether malignancy originated in colon or rectum, given the pattern of spread to liver, will treat as a stage IV colon cancer.  Initial CEA was only mildly elevated at 9.9, but now is within normal limits at 2.5.  Patient has had port placement. Initial plan was to treat with FOLFOX plus Avastin every 2 weeks.  Can consider Panitumumab as second line.  Avastin was held for the first 6 cycles secondary to persistent rectal bleeding.  Patient completed 12 cycles on Dec 12, 2020.  CT scan results from April 14, 2021 reviewed independently and reported as above with mild progression of disease of 2 known lesions in her liver.  There appears to be no other evidence of disease.  Patient has been evaluated by interventional radiology and plan is to do Y 90 ablation to 2 liver lesions.  Patient will follow-up 1 to 2 weeks after she completes her Y 90 treatment.  Of note, patient has declined pursuing colonoscopy. 2.  Anemia: Resolved. 3.  Renal insufficiency: Essentially resolved.   4.  Neutropenia: Resolved. Patient required periodic Udenyca with her treatments. 5.  Nephrostomy tubes: Internalized on August 15, 2020. 6.  Thrombocytopenia: Resolved. 7.  Peripheral neuropathy: Chronic and unchanged. Consider referral  to neuro oncology. 8.  Elevated LFTs: Resolved. 9.  Pain: Continue hydrocodone as needed.  I spent a total of 30 minutes reviewing chart data, face-to-face evaluation with the patient, counseling and coordination of care as detailed above.   Patient expressed understanding and was in agreement with this plan. She also understands that She can call clinic at any time with any questions, concerns, or complaints.   Cancer Staging Metastatic colorectal cancer [facility]) Staging form: Colon and Rectum, AJCC 8th Edition - Clinical stage from 05/31/2020: Stage IVC (cTX, cNX, pM1c) - Signed by [name], MD on 05/31/2020   [name], MD   05/07/2021 1:40 PM

## 2021-05-15 ENCOUNTER — Telehealth: Payer: Self-pay

## 2021-05-15 NOTE — Telephone Encounter (Signed)
Received a call from [facility] requesting clarification if patient is currently able to work.  The completed forms the received states she was able to return to work on 05/07/21 but capable hours to work is listed as 0.     Since patient is currently pending to be scheduled for Y90 ablation at [facility] IR her work status is changed to unable to return to work at this time.

## 2021-05-28 ENCOUNTER — Other Ambulatory Visit (HOSPITAL_COMMUNITY): Payer: Self-pay | Admitting: Interventional Radiology

## 2021-05-28 DIAGNOSIS — C189 Malignant neoplasm of colon, unspecified: Secondary | ICD-10-CM

## 2021-05-28 DIAGNOSIS — C787 Secondary malignant neoplasm of liver and intrahepatic bile duct: Secondary | ICD-10-CM

## 2021-06-02 DIAGNOSIS — C19 Malignant neoplasm of rectosigmoid junction: Secondary | ICD-10-CM | POA: Diagnosis not present

## 2021-06-08 ENCOUNTER — Other Ambulatory Visit: Payer: Self-pay | Admitting: Radiology

## 2021-06-09 ENCOUNTER — Other Ambulatory Visit (HOSPITAL_COMMUNITY): Payer: Self-pay | Admitting: Interventional Radiology

## 2021-06-09 ENCOUNTER — Ambulatory Visit (HOSPITAL_COMMUNITY)
Admission: RE | Admit: 2021-06-09 | Discharge: 2021-06-09 | Disposition: A | Discharge status: Home / Self Care | Payer: Medicaid Other | Source: Ambulatory Visit | Attending: Interventional Radiology | Admitting: Interventional Radiology

## 2021-06-09 ENCOUNTER — Encounter (HOSPITAL_COMMUNITY): Payer: Self-pay

## 2021-06-09 ENCOUNTER — Other Ambulatory Visit: Payer: Self-pay | Admitting: Student

## 2021-06-09 ENCOUNTER — Encounter (HOSPITAL_COMMUNITY)
Admission: RE | Admit: 2021-06-09 | Discharge: 2021-06-09 | Disposition: A | Discharge status: Home / Self Care | Payer: Medicaid Other | Source: Ambulatory Visit | Attending: Interventional Radiology | Admitting: Interventional Radiology

## 2021-06-09 ENCOUNTER — Other Ambulatory Visit: Payer: Self-pay

## 2021-06-09 DIAGNOSIS — C787 Secondary malignant neoplasm of liver and intrahepatic bile duct: Secondary | ICD-10-CM | POA: Insufficient documentation

## 2021-06-09 DIAGNOSIS — C19 Malignant neoplasm of rectosigmoid junction: Secondary | ICD-10-CM

## 2021-06-09 DIAGNOSIS — C189 Malignant neoplasm of colon, unspecified: Secondary | ICD-10-CM

## 2021-06-09 DIAGNOSIS — C220 Liver cell carcinoma: Secondary | ICD-10-CM | POA: Diagnosis not present

## 2021-06-09 HISTORY — PX: IR US GUIDE VASC ACCESS RIGHT: IMG2390

## 2021-06-09 HISTORY — PX: IR ANGIOGRAM SELECTIVE EACH ADDITIONAL VESSEL: IMG667

## 2021-06-09 HISTORY — PX: IR ANGIOGRAM VISCERAL SELECTIVE: IMG657

## 2021-06-09 HISTORY — PX: IR 3D INDEPENDENT WKST: IMG2385

## 2021-06-09 LAB — CBC WITH DIFFERENTIAL/PLATELET
Abs Immature Granulocytes: 0.02 10*3/uL (ref 0.00–0.07)
Basophils Absolute: 0 10*3/uL (ref 0.0–0.1)
Basophils Relative: 0 %
Eosinophils Absolute: 0.1 10*3/uL (ref 0.0–0.5)
Eosinophils Relative: 2 %
HCT: 35.5 % — ABNORMAL LOW (ref 36.0–46.0)
Hemoglobin: 11.7 g/dL — ABNORMAL LOW (ref 12.0–15.0)
Immature Granulocytes: 0 %
Lymphocytes Relative: 21 %
Lymphs Abs: 1.6 10*3/uL (ref 0.7–4.0)
MCH: 31.9 pg (ref 26.0–34.0)
MCHC: 33 g/dL (ref 30.0–36.0)
MCV: 96.7 fL (ref 80.0–100.0)
Monocytes Absolute: 0.5 10*3/uL (ref 0.1–1.0)
Monocytes Relative: 7 %
Neutro Abs: 5.3 10*3/uL (ref 1.7–7.7)
Neutrophils Relative %: 70 %
Platelets: 170 10*3/uL (ref 150–400)
RBC: 3.67 MIL/uL — ABNORMAL LOW (ref 3.87–5.11)
RDW: 14.1 % (ref 11.5–15.5)
WBC: 7.6 10*3/uL (ref 4.0–10.5)
nRBC: 0 % (ref 0.0–0.2)

## 2021-06-09 LAB — COMPREHENSIVE METABOLIC PANEL
ALT: 10 U/L (ref 0–44)
AST: 16 U/L (ref 15–41)
Albumin: 3.5 g/dL (ref 3.5–5.0)
Alkaline Phosphatase: 84 U/L (ref 38–126)
Anion gap: 7 (ref 5–15)
BUN: 22 mg/dL — ABNORMAL HIGH (ref 6–20)
CO2: 26 mmol/L (ref 22–32)
Calcium: 8.7 mg/dL — ABNORMAL LOW (ref 8.9–10.3)
Chloride: 105 mmol/L (ref 98–111)
Creatinine, Ser: 1.23 mg/dL — ABNORMAL HIGH (ref 0.44–1.00)
GFR, Estimated: 53 mL/min — ABNORMAL LOW (ref >60)
Glucose, Bld: 91 mg/dL (ref 70–99)
Potassium: 3.7 mmol/L (ref 3.5–5.1)
Sodium: 138 mmol/L (ref 135–145)
Total Bilirubin: 0.7 mg/dL (ref 0.3–1.2)
Total Protein: 7.7 g/dL (ref 6.5–8.1)

## 2021-06-09 LAB — PROTIME-INR
INR: 1 (ref 0.8–1.2)
Prothrombin Time: 13.6 seconds (ref 11.4–15.2)

## 2021-06-09 MED ORDER — LIDOCAINE HCL (PF) 1 % IJ SOLN
INTRAMUSCULAR | Status: AC | PRN
  Administered 2021-06-09: 10 mL via INTRADERMAL

## 2021-06-09 MED ORDER — FENTANYL CITRATE (PF) 100 MCG/2ML IJ SOLN
INTRAMUSCULAR | Status: AC
  Filled 2021-06-09: qty 2

## 2021-06-09 MED ORDER — NITROGLYCERIN IN D5W 100-5 MCG/ML-% IV SOLN
INTRAVENOUS | Status: AC
  Filled 2021-06-09: qty 250

## 2021-06-09 MED ORDER — SODIUM CHLORIDE 0.9 % IV SOLN: INTRAVENOUS | Status: DC

## 2021-06-09 MED ORDER — MIDAZOLAM HCL 2 MG/2ML IJ SOLN
INTRAMUSCULAR | Status: AC | PRN
  Administered 2021-06-09: 1 mg via INTRAVENOUS

## 2021-06-09 MED ORDER — DIPHENHYDRAMINE HCL 50 MG/ML IJ SOLN
INTRAMUSCULAR | Status: AC | PRN
  Administered 2021-06-09: 25 mg via INTRAVENOUS

## 2021-06-09 MED ORDER — IOHEXOL 300 MG/ML  SOLN
100.0000 mL | Freq: Once | INTRAMUSCULAR | Status: AC | PRN
  Administered 2021-06-09: 64 mL via INTRA_ARTERIAL

## 2021-06-09 MED ORDER — ONDANSETRON HCL 4 MG/2ML IJ SOLN
INTRAMUSCULAR | Status: AC
  Administered 2021-06-09: 4 mg via INTRAVENOUS
  Filled 2021-06-09: qty 2

## 2021-06-09 MED ORDER — ONDANSETRON HCL 4 MG/2ML IJ SOLN: INTRAMUSCULAR | Status: AC | PRN

## 2021-06-09 MED ORDER — LIDOCAINE HCL 1 % IJ SOLN
INTRAMUSCULAR | Status: AC
  Filled 2021-06-09: qty 20

## 2021-06-09 MED ORDER — NITROGLYCERIN 1 MG/10 ML FOR IR/CATH LAB
INTRA_ARTERIAL | Status: AC | PRN
  Administered 2021-06-09: 200 ug via INTRA_ARTERIAL

## 2021-06-09 MED ORDER — FENTANYL CITRATE (PF) 100 MCG/2ML IJ SOLN
INTRAMUSCULAR | Status: AC | PRN
  Administered 2021-06-09: 25 ug via INTRAVENOUS

## 2021-06-09 MED ORDER — HEPARIN SOD (PORK) LOCK FLUSH 100 UNIT/ML IV SOLN
500.0000 [IU] | Freq: Once | INTRAVENOUS | Status: AC
  Administered 2021-06-09: 500 [IU] via INTRAVENOUS
  Filled 2021-06-09: qty 5

## 2021-06-09 MED ORDER — ONDANSETRON HCL 4 MG/2ML IJ SOLN: 4.0000 mg | Freq: Once | INTRAMUSCULAR | Status: AC

## 2021-06-09 MED ORDER — FENTANYL CITRATE (PF) 100 MCG/2ML IJ SOLN
INTRAMUSCULAR | Status: AC | PRN
  Administered 2021-06-09: 50 ug via INTRAVENOUS

## 2021-06-09 MED ORDER — LIDOCAINE HCL (PF) 1 % IJ SOLN
INTRAMUSCULAR | Status: AC | PRN
  Administered 2021-06-09: 5 mL via INTRADERMAL

## 2021-06-09 MED ORDER — IOHEXOL 300 MG/ML  SOLN
100.0000 mL | Freq: Once | INTRAMUSCULAR | Status: AC | PRN
  Administered 2021-06-09: 30 mL via INTRA_ARTERIAL

## 2021-06-09 MED ORDER — DIPHENHYDRAMINE HCL 50 MG/ML IJ SOLN
INTRAMUSCULAR | Status: AC
  Filled 2021-06-09: qty 1

## 2021-06-09 MED ORDER — MIDAZOLAM HCL 2 MG/2ML IJ SOLN
INTRAMUSCULAR | Status: AC
  Filled 2021-06-09: qty 4

## 2021-06-09 NOTE — Progress Notes (Signed)
Patient was suppose to be discharged at 1615; however, daughter was caught in traffic and did not arrive until 1645.

## 2021-06-09 NOTE — H&P (Addendum)
Chief Complaint: Patient was seen in consultation today for Pre Y-90, hepatic angiogram with Tc-MAA administration at the request of [name],[name]/ [name],[name]  Referring Physician(s): [name],[name]/ [name],[name]  Supervising Physician: [name]  Patient Status: [place]  History of Present Illness: Anita Sullivan is a 55 y.o. female with PMHs of colorectal cancer (KRAS wild type) with multifocal metastases to the liver. Patient was referred to Dr. [name] and had a consultation visit on 04/28/21 when she was deemed to be a good candidate for Y-90 treatment, split-dose segmentectomies of the respective right lobe masses.   After thorough discussion and shared decision making, patient decided to proceed with Y-90 treatment.   Patient presents to [facility] IR for pre Y-90 treatment today.   Patient laying in bed, not in acute distress.  Denise headache, fever, chills, shortness of breath, cough, chest pain, abdominal pain, nausea ,vomiting, and bleeding.   Past Medical History:  Diagnosis Date   Colon cancer ([name])    Family history of pancreatic cancer    Family history of uterine cancer     Past Surgical History:  Procedure Laterality Date   COLONOSCOPY WITH PROPOFOL N/A 05/27/2020   Procedure: COLONOSCOPY WITH PROPOFOL;  Surgeon: [name], MD;  Location: [facility] ENDOSCOPY;  Service: Gastroenterology;  Laterality: N/A;   FLEXIBLE SIGMOIDOSCOPY N/A 05/28/2020   Procedure: FLEXIBLE SIGMOIDOSCOPY;  Surgeon: [name], MD;  Location: [facility] ENDOSCOPY;  Service: Gastroenterology;  Laterality: N/A;   IR NEPHROSTOMY EXCHANGE LEFT  07/11/2020   IR NEPHROSTOMY EXCHANGE RIGHT  07/11/2020   IR NEPHROSTOMY PLACEMENT LEFT  05/23/2020   IR NEPHROSTOMY PLACEMENT RIGHT  05/23/2020   IR RADIOLOGIST EVAL & MGMT  04/28/2021   IR URETERAL STENT LEFT NEW ACCESS W/O SEP NEPHROSTOMY CATH  08/15/2020   IR URETERAL STENT RIGHT NEW ACCESS W/O SEP NEPHROSTOMY CATH   08/15/2020   PORTACATH PLACEMENT Left 05/29/2020   Procedure: INSERTION PORT-A-CATH;  Surgeon: [name], MD;  Location: [facility] ORS;  Service: General;  Laterality: Left;    Allergies: Patient has no known allergies.  Medications: Prior to Admission medications   Medication Sig Start Date End Date Taking? Authorizing Provider  acetaminophen (TYLENOL) 325 MG tablet Take 650 mg by mouth every 6 (six) hours as needed.   Yes [provider]  lidocaine-prilocaine (EMLA) cream Apply 1 application topically as needed. Apply prior to appointment. Cover with plastic wrap. 11/05/20  Yes [name], MD  HYDROcodone-acetaminophen (NORCO) 5-325 MG tablet Take 1 tablet by mouth every 6 (six) hours as needed for moderate pain. 04/16/21   [name], MD  loratadine (CLARITIN) 10 MG tablet Take 10 mg by mouth daily.    [provider]  ondansetron (ZOFRAN) 4 MG tablet Take 1 tablet (4 mg total) by mouth every 8 (eight) hours as needed for nausea or vomiting. 11/05/20   [name], MD  pantoprazole (PROTONIX) 40 MG tablet Take 1 tablet (40 mg total) by mouth 2 (two) times daily. 11/05/20   [name], MD  prochlorperazine (COMPAZINE) 10 MG tablet Take 1 tablet (10 mg total) by mouth every 6 (six) hours as needed (Nausea or vomiting). 06/04/20 06/12/20  [name], MD     Family History  Problem Relation Age of Onset   Other Mother        Corticobasal degeneration   Uterine cancer Mother    Diabetes Maternal Grandmother    Stroke Maternal Grandfather  Stroke Paternal Grandfather    Heart disease Father    Pancreatic cancer Maternal Uncle 33   Cancer Paternal Aunt        unk type   Liver cancer Maternal Uncle    Pancreatic cancer Cousin    Cancer Cousin        unk type    Social History   Socioeconomic History   Marital status: Divorced    Spouse name: Not on file   Number of children: 1   Years of education: Not on file   Highest  education level: Not on file  Occupational History   Not on file  Tobacco Use   Smoking status: Former    Types: Cigarettes   Smokeless tobacco: Never  Vaping Use   Vaping Use: Never used  Substance and Sexual Activity   Alcohol use: Never   Drug use: Never   Sexual activity: Not Currently  Other Topics Concern   Not on file  Social History Narrative   Lives with dad, 1 daughter and 2 dogs   Social Determinants of Health   Financial Resource Strain: Not on file  Food Insecurity: Not on file  Transportation Needs: Not on file  Physical Activity: Not on file  Stress: Not on file  Social Connections: Not on file     Review of Systems: A 12 point ROS discussed and pertinent positives are indicated in the HPI above.  All other systems are negative.  Vital Signs: BP 110/77   Pulse 76   Temp 97.7 F (36.5 C) (Oral)   Resp 16   Ht 5' (1.524 m)   Wt 138 lb (62.6 kg)   SpO2 99%   BMI 26.95 kg/m    Physical Exam Vitals reviewed.  Constitutional:      General: She is not in acute distress.    Appearance: Normal appearance. She is not ill-appearing.  HENT:     Head: Normocephalic and atraumatic.     Mouth/Throat:     Mouth: Mucous membranes are moist.  Eyes:     Extraocular Movements: Extraocular movements intact.  Cardiovascular:     Rate and Rhythm: Normal rate and regular rhythm.     Heart sounds: Normal heart sounds.     Comments: Port-a-cath on left upper chest  Pulmonary:     Effort: Pulmonary effort is normal.     Breath sounds: Normal breath sounds.  Abdominal:     General: Abdomen is flat. Bowel sounds are normal.     Palpations: Abdomen is soft.  Musculoskeletal:     Cervical back: Normal range of motion and neck supple.  Skin:    General: Skin is warm and dry.     Coloration: Skin is not jaundiced or pale.  Neurological:     Mental Status: She is alert and oriented to person, place, and time.  Psychiatric:        Mood and Affect: Mood normal.         Behavior: Behavior normal.        Judgment: Judgment normal.    MD Evaluation Airway: WNL Heart: WNL Abdomen: WNL Chest/ Lungs: WNL ASA  Classification: 3 Mallampati/Airway Score: Two  Imaging: No results found.  Labs:  CBC: Recent Labs    11/26/20 0817 12/10/20 0838 01/28/21 0947 04/13/21 0953  WBC 7.5 8.2 4.8 7.3  HGB 9.9* 9.9* 11.6* 12.7  HCT 31.2* 31.2* 37.1 38.9  PLT 128* 127* 124* 160    COAGS: No results for input(s):  INR, APTT in the last 8760 hours.  BMP: Recent Labs    11/26/20 0817 12/10/20 0838 01/28/21 0947 04/13/21 0953  NA 138 136 137 138  K 3.9 3.6 4.3 4.2  CL 106 106 105 104  CO2 _0 GLUCOSE 94 87 100* 89  BUN 17 16 23* 21*  CALCIUM 8.8* 8.5* 8.8* 8.6*  CREATININE 1.22* 1.32* 1.38* 1.16*  GFRNONAA 53* 49* 46* 56*    LIVER FUNCTION TESTS: Recent Labs    11/26/20 0817 12/10/20 0838 01/28/21 0947 04/13/21 0953  BILITOT 0.4 0.5 0.7 0.4  AST 45* 38 69* 30  ALT 30 20 50* 22  ALKPHOS 118 152* 194* 140*  PROT 6.9 7.1 7.3 7.5  ALBUMIN 3.2* 3.3* 3.3* 3.6    TUMOR MARKERS: No results for input(s): AFPTM, CEA, CA199, CHROMGRNA in the last 8760 hours.  Assessment and Plan: 55 y.o. female with metastatic CRC with multifocal metastases to the liver. Patient was referred to Dr. [name] and had a consultation visit on 04/28/21. She was deemed good candidate for Y-90 treatment, and after thorough discussion and shared decision making, patient decided to proceed with Y-90 treatment.   Patient presents to [facility] IR today for the pre Y-90 treatment which includes hepatic angiogram with Tc-MAA administration.  NPO since MN VSS Labs pending  Not on AC/AP  Pt reports hx nausea/vomiting after sedation.  87m Zofran ordered, to be given in IR.   Risks and benefits discussed with the patient including, but not limited to bleeding, infection, vascular injury, post procedural pain, nausea, vomiting and fatigue, contrast induced renal failure,  liver failure, radiation injury to the bowel, radiation induced cholecystitis, neutropenia and possible need for additional procedures.  All of the patient's questions were answered, patient is agreeable to proceed. Consent signed and in chart.   Thank you for this interesting consult.  I greatly enjoyed meeting KDamiyah Ditmarsand look forward to participating in their care.  A copy of this report was sent to the requesting provider on this date.  Electronically Signed: ATera Mater PA-C 06/09/2021, 8:37 AM   I spent a total of    25 Minutes in face to face in clinical consultation, greater than 50% of which was counseling/coordinating care for pre-Y-90.   This chart was dictated using voice recognition software.  Despite best efforts to proofread,  errors can occur which can change the documentation meaning.

## 2021-06-09 NOTE — Procedures (Signed)
Interventional Radiology Procedure Note  Procedure: Hepatic angiogram  Findings: Please refer to procedural dictation for full description. Common hepatic angiogram and cone beam CT performed.  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  Complications: Right hepatic artery spasm.  Estimated Blood Loss: <5 mL  Recommendations: Strict 4 hour bedrest (head of bed flat for 2 hours, up to 30 degrees for 2 hours). Plan to follow up in 1 month with CTA abdomen to evaluate patency of the right hepatic artery prior to repeat Tc-MAA mapping study.    [name], MD Pager: [phone]

## 2021-06-09 NOTE — Discharge Instructions (Addendum)
There are no changes to your home medications    Interventional radiology phone numbers [phone] After hours [phone] Interventional radiology phone numbers [phone] After hours [phone]   Moderate Conscious Sedation, Adult, Care After This sheet gives you information about how to care for yourself after your procedure. Your health care provider may also give you more specific instructions. If you have problems or questions, contact your health care provider. What can I expect after the procedure? After the procedure, it is common to have: Sleepiness for several hours. Impaired judgment for several hours. Difficulty with balance. Vomiting if you eat too soon. Follow these instructions at home: For the time period you were told by your health care provider: Rest. Do not participate in activities where you could fall or become injured. Do not drive or use machinery. Do not drink alcohol. Do not take sleeping pills or medicines that cause drowsiness. Do not make important decisions or sign legal documents. Do not take care of children on your own.      Eating and drinking Follow the diet recommended by your health care provider. Drink enough fluid to keep your urine pale yellow. If you vomit: Drink water, juice, or soup when you can drink without vomiting. Make sure you have little or no nausea before eating solid foods.   General instructions Take over-the-counter and prescription medicines only as told by your health care provider. Have a responsible adult stay with you for the time you are told. It is important to have someone help care for you until you are awake and alert. Do not smoke. Keep all follow-up visits as told by your health care provider. This is important. Contact a health care provider if: You are still sleepy or having trouble with balance after 24 hours. You feel light-headed. You keep feeling nauseous or you keep vomiting. You develop a  rash. You have a fever. You have redness or swelling around the IV site. Get help right away if: You have trouble breathing. You have new-onset confusion at home. Summary After the procedure, it is common to feel sleepy, have impaired judgment, or feel nauseous if you eat too soon. Rest after you get home. Know the things you should not do after the procedure. Follow the diet recommended by your health care provider and drink enough fluid to keep your urine pale yellow. Get help right away if you have trouble breathing or new-onset confusion at home. This information is not intended to replace advice given to you by your health care provider. Make sure you discuss any questions you have with your health care provider. Document Revised: 11/16/2019 Document Reviewed: 06/14/2019 Elsevier Patient Education  2021 [employer].

## 2021-06-10 ENCOUNTER — Other Ambulatory Visit (HOSPITAL_COMMUNITY): Payer: Medicaid Other

## 2021-06-16 ENCOUNTER — Other Ambulatory Visit: Payer: Self-pay | Admitting: Interventional Radiology

## 2021-06-16 DIAGNOSIS — C19 Malignant neoplasm of rectosigmoid junction: Secondary | ICD-10-CM

## 2021-06-22 ENCOUNTER — Other Ambulatory Visit (HOSPITAL_COMMUNITY): Payer: Medicaid Other

## 2021-06-22 ENCOUNTER — Ambulatory Visit (HOSPITAL_COMMUNITY): Payer: Medicaid Other

## 2021-06-22 ENCOUNTER — Encounter (HOSPITAL_COMMUNITY): Payer: Medicaid Other

## 2021-06-30 ENCOUNTER — Ambulatory Visit
Admission: RE | Admit: 2021-06-30 | Discharge: 2021-06-30 | Disposition: A | Discharge status: Home / Self Care | Payer: Medicaid Other | Source: Ambulatory Visit | Attending: Interventional Radiology | Admitting: Interventional Radiology

## 2021-06-30 DIAGNOSIS — C19 Malignant neoplasm of rectosigmoid junction: Secondary | ICD-10-CM | POA: Diagnosis not present

## 2021-06-30 DIAGNOSIS — D259 Leiomyoma of uterus, unspecified: Secondary | ICD-10-CM | POA: Diagnosis not present

## 2021-06-30 MED ORDER — IOHEXOL 350 MG/ML SOLN
100.0000 mL | Freq: Once | INTRAVENOUS | Status: AC | PRN
  Administered 2021-06-30: 100 mL via INTRAVENOUS

## 2021-07-06 DIAGNOSIS — C19 Malignant neoplasm of rectosigmoid junction: Secondary | ICD-10-CM | POA: Diagnosis not present

## 2021-07-09 ENCOUNTER — Other Ambulatory Visit: Payer: Self-pay | Admitting: *Deleted

## 2021-07-09 ENCOUNTER — Telehealth: Payer: Self-pay | Admitting: Nurse Practitioner

## 2021-07-09 DIAGNOSIS — C19 Malignant neoplasm of rectosigmoid junction: Secondary | ICD-10-CM

## 2021-07-09 NOTE — Telephone Encounter (Signed)
..  Patient declines further follow up and engagement by the Managed Medicaid Team. Appropriate care team members and provider have been notified via electronic communication. The Managed Medicaid Team is available to follow up with the patient after provider conversation with the patient regarding recommendation for engagement and subsequent re-referral to the Managed Medicaid Team.    Jennifer Alley Care Guide, High Risk Medicaid Managed Care Embedded Care Coordination [place]  [facility]   

## 2021-07-13 ENCOUNTER — Other Ambulatory Visit: Payer: Self-pay

## 2021-07-13 ENCOUNTER — Other Ambulatory Visit: Payer: Self-pay | Admitting: *Deleted

## 2021-07-13 DIAGNOSIS — Z5941 Food insecurity: Secondary | ICD-10-CM

## 2021-07-13 DIAGNOSIS — Z599 Problem related to housing and economic circumstances, unspecified: Secondary | ICD-10-CM

## 2021-07-13 NOTE — Progress Notes (Signed)
[facility]  [phone] [phone]  ID: Anita Sullivan OB: 07/08/1966  MR#: [id number]  CSN#:[id number]  Patient Care Team: Holdsworth, Karen, NP as PCP - General (Nurse Practitioner) ,  J, MD as Consulting Physician (Oncology) Stanton, Kristi D, RN as Oncology Nurse Navigator Robb, Melanie A, RN as Case Manager  CHIEF COMPLAINT: Metastatic colorectal adenocarcinoma, KRAS wild-type  INTERVAL HISTORY: Patient returns to clinic today for routine evaluation.  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  She has noticed increased weakness and fatigue, vague abdominal pain, and occasional blood in her rectum.  She continues to have a mild peripheral neuropathy, but no other neurologic complaints.  She has a good appetite and denies weight loss.  She denies any chest pain, shortness of breath, cough, or hemoptysis.  She denies any nausea, vomiting, constipation, or diarrhea.  She has no urinary complaints.  Patient offers no further specific complaints today.  REVIEW OF SYSTEMS:   Review of Systems  Constitutional:  Positive for malaise/fatigue. Negative for fever and weight loss.  HENT: Negative.  Negative for congestion.   Respiratory: Negative.  Negative for cough, hemoptysis and shortness of breath.   Cardiovascular: Negative.  Negative for chest pain and leg swelling.  Gastrointestinal:  Positive for blood in stool. Negative for abdominal pain, constipation, nausea and vomiting.  Genitourinary: Negative.  Negative for flank pain and hematuria.  Musculoskeletal: Negative.  Negative for back pain.  Skin: Negative.  Negative for rash.  Neurological:  Positive for tingling, sensory change and weakness. Negative for dizziness, seizures and headaches.  Psychiatric/Behavioral:  The patient is not nervous/anxious.    As per HPI. Otherwise, a complete review of systems is  negative.  PAST MEDICAL HISTORY: Past Medical History:  Diagnosis Date   Colon cancer (HCC)    Family history of pancreatic cancer    Family history of uterine cancer     PAST SURGICAL HISTORY: Past Surgical History:  Procedure Laterality Date   COLONOSCOPY WITH PROPOFOL N/A 05/27/2020   Procedure: COLONOSCOPY WITH PROPOFOL;  Surgeon: Vanga, Rohini Reddy, MD;  Location: [facility] ENDOSCOPY;  Service: Gastroenterology;  Laterality: N/A;   FLEXIBLE SIGMOIDOSCOPY N/A 05/28/2020   Procedure: FLEXIBLE SIGMOIDOSCOPY;  Surgeon: Vanga, Rohini Reddy, MD;  Location: [facility] ENDOSCOPY;  Service: Gastroenterology;  Laterality: N/A;   IR 3D INDEPENDENT WKST  06/09/2021   IR ANGIOGRAM SELECTIVE EACH ADDITIONAL VESSEL  06/09/2021   IR ANGIOGRAM SELECTIVE EACH ADDITIONAL VESSEL  06/09/2021   IR ANGIOGRAM SELECTIVE EACH ADDITIONAL VESSEL  06/09/2021   IR ANGIOGRAM VISCERAL SELECTIVE  06/09/2021   IR NEPHROSTOMY EXCHANGE LEFT  07/11/2020   IR NEPHROSTOMY EXCHANGE RIGHT  07/11/2020   IR NEPHROSTOMY PLACEMENT LEFT  05/23/2020   IR NEPHROSTOMY PLACEMENT RIGHT  05/23/2020   IR RADIOLOGIST EVAL & MGMT  04/28/2021   IR URETERAL STENT LEFT NEW ACCESS W/O SEP NEPHROSTOMY CATH  08/15/2020   IR URETERAL STENT RIGHT NEW ACCESS W/O SEP NEPHROSTOMY CATH  08/15/2020   IR US GUIDE VASC ACCESS RIGHT  06/09/2021   PORTACATH PLACEMENT Left 05/29/2020   Procedure: INSERTION PORT-A-CATH;  Surgeon: Piscoya, Jose, MD;  Location: [facility] ORS;  Service: General;  Laterality: Left;    FAMILY HISTORY: Family History  Problem Relation Age of Onset   Other Mother        Corticobasal degeneration   Uterine cancer Mother    Diabetes Maternal Grandmother    Stroke Maternal Grandfather      Stroke Paternal Grandfather    Heart disease Father    Pancreatic cancer Maternal Uncle 55   Cancer Paternal Aunt        unk type   Liver cancer Maternal Uncle    Pancreatic cancer Cousin    Cancer Cousin        unk type    ADVANCED DIRECTIVES  (Y/N):  N  HEALTH MAINTENANCE: Social History   Tobacco Use   Smoking status: Former    Types: Cigarettes   Smokeless tobacco: Never  Vaping Use   Vaping Use: Never used  Substance Use Topics   Alcohol use: Never   Drug use: Never     Colonoscopy:  PAP:  Bone density:  Lipid panel:  No Known Allergies  Current Outpatient Medications  Medication Sig Dispense Refill   acetaminophen (TYLENOL) 325 MG tablet Take 650 mg by mouth every 6 (six) hours as needed.     HYDROcodone-acetaminophen (NORCO) 5-325 MG tablet Take 1 tablet by mouth every 6 (six) hours as needed for moderate pain. 60 tablet 0   lidocaine-prilocaine (EMLA) cream Apply 1 application topically as needed. Apply prior to appointment. Cover with plastic wrap. 30 g 2   Loperamide HCl (IMODIUM PO) Take by mouth.     loratadine (CLARITIN) 10 MG tablet Take 10 mg by mouth daily.     ondansetron (ZOFRAN) 4 MG tablet Take 1 tablet (4 mg total) by mouth every 8 (eight) hours as needed for nausea or vomiting. 30 tablet 2   pantoprazole (PROTONIX) 40 MG tablet Take 1 tablet (40 mg total) by mouth 2 (two) times daily. 60 tablet 0   No current facility-administered medications for this visit.    OBJECTIVE: Vitals:   07/14/21 1005  BP: 90/64  Pulse: 82  Resp: 16  Temp: (!) 97.3 F (36.3 C)  SpO2: 99%     Body mass index is 27.52 kg/m.    ECOG FS:0 - Asymptomatic  General: Well-developed, well-nourished, no acute distress. Eyes: Pink conjunctiva, anicteric sclera. HEENT: Normocephalic, moist mucous membranes. Lungs: No audible wheezing or coughing. Heart: Regular rate and rhythm. Abdomen: Soft, nontender, no obvious distention. Musculoskeletal: No edema, cyanosis, or clubbing. Neuro: Alert, answering all questions appropriately. Cranial nerves grossly intact. Skin: No rashes or petechiae noted. Psych: Normal affect.   LAB RESULTS:  Lab Results  Component Value Date   NA 137 07/14/2021   K 3.7 07/14/2021    CL 101 07/14/2021   CO2 27 07/14/2021   GLUCOSE 95 07/14/2021   BUN 16 07/14/2021   CREATININE 1.17 (H) 07/14/2021   CALCIUM 8.7 (L) 07/14/2021   PROT 7.7 07/14/2021   ALBUMIN 3.4 (L) 07/14/2021   AST 17 07/14/2021   ALT 10 07/14/2021   ALKPHOS 77 07/14/2021   BILITOT 0.5 07/14/2021   GFRNONAA 56 (L) 07/14/2021    Lab Results  Component Value Date   WBC 8.0 07/14/2021   NEUTROABS 5.7 07/14/2021   HGB 11.5 (L) 07/14/2021   HCT 34.9 (L) 07/14/2021   MCV 98.0 07/14/2021   PLT 327 07/14/2021     STUDIES: CT ANGIO ABDOMEN PELVIS  W &/OR WO CONTRAST  Result Date: 06/30/2021 CLINICAL DATA:  55-year-old female with history of multifocal needle lobar colorectal hepatic metastases status post hepatic angiogram performed on 06/09/2021 as planning procedure prior to radioembolization which was 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 Presents for 1 month follow-up CT to assess patency of right hepatic artery. EXAM: CT ANGIOGRAPHY ABDOMEN AND PELVIS   WITH CONTRAST AND WITHOUT CONTRAST TECHNIQUE: Multidetector CT imaging of the abdomen and pelvis was performed using the standard protocol during bolus administration of intravenous contrast. Multiplanar reconstructed images and MIPs were obtained and reviewed to evaluate the vascular anatomy. CONTRAST:  100mL OMNIPAQUE IOHEXOL 350 MG/ML SOLN COMPARISON:  06/09/2021, 04/14/2021 FINDINGS: VASCULAR Aorta: Normal caliber aorta without aneurysm, dissection, vasculitis or significant stenosis. Celiac: Common origin of the celiac trunk and SMA is again seen. The celiac and its branches are patent proximally. Non opacification of the right hepatic artery just distal to the bifurcation of the left hepatic artery and GDA there is interval collateralization from branches of the proximal left hepatic artery which fill the right segment 6 and 7 branches. SMA: Patent without evidence of aneurysm, dissection, vasculitis or significant stenosis.  Renals: Single bilateral renal arteries are patent without evidence of aneurysm, dissection, vasculitis, fibromuscular dysplasia or significant stenosis. IMA: Patent without evidence of aneurysm, dissection, vasculitis or significant stenosis. Inflow: Patent without evidence of aneurysm, dissection, vasculitis or significant stenosis. Proximal Outflow: Bilateral common femoral and visualized portions of the superficial and profunda femoral arteries are patent without evidence of aneurysm, dissection, vasculitis or significant stenosis. Veins: The hepatic veins are widely patent. The portal system is widely patent and normal in caliber. The renal veins are patent bilaterally in standard anatomic configuration. No evidence of iliocaval thrombosis or anomaly. Review of the MIP images confirms the above findings. NON-VASCULAR Lower chest: No acute abnormality. Hepatobiliary: The liver is normal in size, contour, and attenuation. Again seen are multifocal metastatic lesions in the right lobe of the liver. Within segment 6 is a lobular, heterogeneously hypoattenuating and peripherally enhancing mass measuring approximately 4.9 x 3.3 cm in maximum axial dimensions, previously 2.8 x 2.6 cm. Similar appearing vague heterogeneously hypoattenuating mass in the dome of the right liver measuring up to approximately 3.0 x 2.9 cm, previously 2.5 x 2.4 cm. Again seen is a hypoattenuating focus in the left lobe of the liver which now measures up to approximately 1.6 x 1.2 cm, previously 1.0 x 0.8 cm. No new masses are seen. The gallbladder is present unremarkable. No intra or extrahepatic biliary ductal dilation. Pancreas: Unremarkable. No pancreatic ductal dilatation or surrounding inflammatory changes. Spleen: Normal in size without focal abnormality. Adrenals/Urinary Tract: The adrenal glands are normal in size morphology bilaterally. Similar appearing diffuse cortical atrophy of the right kidney. The left kidney appears normal.  Unchanged appearance of indwelling bilateral double-J nephroureteral stents. The bladder is within normal limits. Stomach/Bowel: Stomach is within normal limits. Appendix appears normal. Similar appearing circumferential mild mural thickening about the distal sigmoid colon. Slightly increased conspicuity of previously visualized soft tissue nodularity surrounding the anterior rectum with several scattered mildly prominent perirectal lymph nodes. Lymphatic: No abdominopelvic lymphadenopathy. Reproductive: Prominent, lobular uterus, again in keeping with uterine fibroids. Other: No abdominal wall hernia or abnormality. No abdominopelvic ascites. Musculoskeletal: No acute or significant osseous findings. 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 Interval collateralization of the segment 6 and 7 arterial branches from the left hepatic artery. NON-VASCULAR 1. Slight interval enlargement of each of the previously visualized hepatic colorectal metastatic lesions including the segment 6 mass which is enlarged to 4.9 cm from 2.8 cm, segment 7 mass to 3.0 cm from 2.5 cm, and left lobe mass to 1.6 cm from 1.0 cm. 2. Slight increased conspicuity of previously visualized soft tissue nodularity surrounding the anterior rectum without definite enlargement, though irregular morphology prevents precise comparison. 3. No   new abdominopelvic lymphadenopathy. [name], MD [facility]iology Electronically Signed   By: [name] M.D.   On: 06/30/2021 15:57    ASSESSMENT: Metastatic colorectal adenocarcinoma,  KRAS wild-type.  PLAN:   1. Metastatic colorectal adenocarcinoma: Although unclear whether malignancy originated in colon or rectum, given the pattern of spread to liver, will treat as a stage IV colon cancer.  Initial CEA was only mildly elevated at 9.9, but now is within normal limits at 2.5, today's result is pending.  Initial  plan was to treat with FOLFOX plus Avastin every 2 weeks.  Can consider Panitumumab as second line.  Avastin was held for the first 6 cycles secondary to persistent rectal bleeding.  Patient completed 12 cycles on Dec 12, 2020.  CT scan results from April 14, 2021 reviewed independently and reported as above with mild progression of disease of 2 known lesions in her liver.  There appears to be no other evidence of disease.  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  This is rescheduled for January 2023.  Given patient's vague symptoms of increased bleeding and weakness and fatigue, will repeat imaging of abdomen and pelvis in the next 1 to 2 weeks.  Current plan is to follow-up after her ablation evaluation.   2.  Anemia: Mild.  Patient's hemoglobin is 11.5 today. 3.  Renal insufficiency: Mild.  Patient's creatinine is 1.17 today. 4.  Neutropenia: Resolved. Patient required periodic Udenyca with her treatments. 5.  Nephrostomy tubes: Internalized on August 15, 2020. 6.  Thrombocytopenia: Resolved. 7.  Peripheral neuropathy: Chronic and unchanged.  Consider referral to neuro oncology. 8.  Elevated LFTs: Resolved. 9.  Pain: Patient does not complain of this today.  Continue hydrocodone as needed.   Patient expressed understanding and was in agreement with this plan. She also understands that She can call clinic at any time with any questions, concerns, or complaints.    Cancer Staging  Metastatic colorectal cancer [facility]) Staging form: Colon and Rectum, AJCC 8th Edition - Clinical stage from 05/31/2020: Stage IVC (cTX, cNX, pM1c) - Signed by [name], MD on 05/31/2020   [name], MD   07/15/2021 6:18 AM

## 2021-07-13 NOTE — Patient Outreach (Signed)
Medicaid Managed Care   Nurse Care Manager Note  07/13/2021 Name:  Anita Sullivan MRN:  [id number] DOB:  1966/06/13  Anita Sullivan is an 55 y.o. year old female who is Sullivan primary patient of Anita Billings, NP.  The [facility] Managed Care Coordination team was consulted for assistance with:    Metastatic colorectal cancer  Anita Sullivan was given information about Medicaid Managed Care Coordination team services today. Anita Sullivan Patient agreed to services and verbal consent obtained.  Engaged with patient by telephone for initial visit in response to provider referral for case management and/or care coordination services.   Assessments/Interventions:  Review of past medical history, allergies, medications, health status, including review of consultants reports, laboratory and other test data, was performed as part of comprehensive evaluation and provision of chronic care management services.  SDOH (Social Determinants of Health) assessments and interventions performed: SDOH Interventions    Flowsheet Row Most Recent Value  SDOH Interventions   Food Insecurity Interventions Other (Comment)  [Care Guide referral for assistance]  Housing Interventions Intervention Not Indicated  Transportation Interventions Intervention Not Indicated       Care Plan  No Known Allergies  Medications Reviewed Today     Reviewed by Anita Montane, RN (Registered Nurse) on 07/13/21 at 1347  Med List Status: <None>   Medication Order Taking? Sig Documenting Provider Last Dose Status Informant  acetaminophen (TYLENOL) 325 MG tablet [id number] Yes Take 650 mg by mouth every 6 (six) hours as needed. [provider] Taking Active   HYDROcodone-acetaminophen (NORCO) 5-325 MG tablet [id number] Yes Take 1 tablet by mouth every 6 (six) hours as needed for moderate pain. Anita Huger, MD Taking Active   lidocaine-prilocaine (EMLA) cream [id number] Yes Apply 1 application topically  as needed. Apply prior to appointment. Cover with plastic wrap. Anita Huger, MD Taking Active   Loperamide HCl (IMODIUM PO) [id number] Yes Take by mouth. [provider] Taking Active   loratadine (CLARITIN) 10 MG tablet [id number] Yes Take 10 mg by mouth daily. [provider] Taking Active            Med Note (Anita Sullivan   Mon Jul 13, 2021  1:43 PM) Taking as needed  ondansetron (ZOFRAN) 4 MG tablet [id number] Yes Take 1 tablet (4 mg total) by mouth every 8 (eight) hours as needed for nausea or vomiting. Anita Huger, MD Taking Active   pantoprazole (PROTONIX) 40 MG tablet [id number] Yes Take 1 tablet (40 mg total) by mouth 2 (two) times daily. Anita Huger, MD Taking Active            Med Note Anita Sullivan Jul 13, 2021  1:45 PM) Taking as needed    Discontinued 06/12/20 0901             Patient Active Problem List   Diagnosis Date Noted   Genetic testing 07/10/2020   Family history of uterine cancer    Family history of pancreatic cancer    Goals of care, counseling/discussion 06/03/2020   Palliative care encounter    Metastatic colorectal cancer ([place])    Rectal bleed 05/25/2020   Bleeding per rectum 05/24/2020   Hydronephrosis    Metabolic acidosis    GI bleed 05/22/2020   Acute renal failure ([place]) 05/22/2020   Obstructive uropathy 05/22/2020   Colitis, acute 05/22/2020    Conditions to be addressed/monitored per PCP order:   metastatic colorectal cancer   Care  Plan : RN Care Manager Plan of Care  Updates made by Anita Montane, RN since 07/13/2021 12:00 AM     Problem: Health Management Needs Related to Metastatic Colorectal Cancer      Long-Range Goal: Development of Plan of Care to Address Health Management Needs Related to Metastatic Colorectal Cancer   Start Date: 07/13/2021  Expected End Date: 10/11/2021  Priority: High  Note:   Current Barriers:  Chronic Disease Management support and education needs  related to metastatic colorectal cancer  RNCM Clinical Goal(s):  Patient will verbalize understanding of plan for management of metastatic colorectal cancer as evidenced by patient verbalization and self monitoring activities take all medications exactly as prescribed and will call provider for medication related questions as evidenced by documentation in EMR    attend all scheduled medical appointments: 07/14/21 with Dr. Massie Maroon as evidenced by provider documentation in EMR        continue to work with RN Care Manager and/or Social Worker to address care management and care coordination needs related to metastatic colorectal cancer as evidenced by adherence to CM Team Scheduled appointments     work with community resource care guide to address needs related to Financial constraints related to food and household products as evidenced by patient and/or community resource care guide support    through collaboration with Consulting civil engineer, provider, and care team.   Interventions: Inter-disciplinary care team collaboration (see longitudinal plan of care) Evaluation of current treatment plan related to  self management and patient's adherence to plan as established by provider Advised patient to schedule an appointment with PCP for an annual physical exam Provided patient with [facility] in [place] [phone]   SDOH Barriers (Status:  New goal.) Long Term Goal Patient interviewed and SDOH assessment performed        SDOH Interventions    Flowsheet Row Most Recent Value  SDOH Interventions   Food Insecurity Interventions Other (Comment)  [Care Guide referral for assistance]  Housing Interventions Intervention Not Indicated  Transportation Interventions Intervention Not Indicated     Patient interviewed and appropriate assessments performed Referred patient to community resources care guide team for assistance with food insecurity and obtaining household products Provided patient with  information about applying for food stamps at [email or web address]  Oncology:  (New goal.) Long Term Goal  Assessment of understanding of oncology diagnosis:  Assessed patient understanding of cancer diagnosis and recommended treatment plan Reviewed upcoming provider appointments and treatment appointments Assessed available transportation to appointments and treatments. Has consistent/reliable transportation: Yes Assessed support system. Has consistent/reliable family or other support: Yes Nutrition assessment performed  Patient Goals/Self-Care Activities: Take medications as prescribed   Attend all scheduled provider appointments Call pharmacy for medication refills 3-7 days in advance of running out of medications Perform all self care activities independently  Perform IADL's (shopping, preparing meals, housekeeping, managing finances) independently Call provider office for new concerns or questions        Follow Up:  Patient agrees to Care Plan and Follow-up.  Plan: The Managed Medicaid care management team will reach out to the patient again over the next 30 days.  Date/time of next scheduled RN care management/care coordination outreach:  08/13/21 @ 1:15pm  [name], BSN [place]  [employer] manager

## 2021-07-13 NOTE — Patient Instructions (Signed)
Visit Information  Ms. Frick was given information about Medicaid Managed Care team care coordination services as a part of their Healthy [facility] Medicaid benefit. Anita Sullivan verbally consented to engagement with the [facility] Managed Care team.   If you are experiencing a medical emergency, please call 911 or report to your local emergency department or urgent care.   If you have a non-emergency medical problem during routine business hours, please contact your provider's office and ask to speak with a nurse.   For questions related to your Healthy [facility] health plan, please call: [phone] or visit the homepage here: [email or web address]  If you would like to schedule transportation through your Healthy [facility] plan, please call the following number at least 2 days in advance of your appointment: [phone]  Call the [place] at [phone], at any time, 24 hours a day, 7 days a week. If you are in danger or need immediate medical attention call 911.  If you would like help to quit smoking, call 1-800-QUIT-NOW (901)741-1388) OR Espaol: 1-855-Djelo-Ya (6-283-151-7616) o para ms informacin haga clic aqu or Text READY to 200-400 to register via text   Please see education materials related to diet provided by MyChart link.  Patient has access to MyChart and can view provided education  Telephone follow up appointment with Managed Medicaid care management team member scheduled for:08/13/21 @ 1:15pm  Anita Joiner RN, BSN [place] RN Care Coordinator   Following is a copy of your plan of care:  Care Plan : RN Care Manager Plan of Care  Updates made by [name], RN since 07/13/2021 12:00 AM     Problem: Health Management Needs Related to Metastatic Colorectal Cancer      Long-Range Goal: Development of Plan of Care to Address Health Management Needs Related to  Metastatic Colorectal Cancer   Start Date: 07/13/2021  Expected End Date: 10/11/2021  Priority: High  Note:   Current Barriers:  Chronic Disease Management support and education needs related to metastatic colorectal cancer  RNCM Clinical Goal(s):  Patient will verbalize understanding of plan for management of metastatic colorectal cancer as evidenced by patient verbalization and self monitoring activities take all medications exactly as prescribed and will call provider for medication related questions as evidenced by documentation in EMR    attend all scheduled medical appointments: 07/14/21 with Dr. Massie Maroon as evidenced by provider documentation in EMR        continue to work with RN Care Manager and/or Social Worker to address care management and care coordination needs related to metastatic colorectal cancer as evidenced by adherence to CM Team Scheduled appointments     work with [employer] care guide to address needs related to Financial constraints related to food and household products as evidenced by patient and/or community resource care guide support    through collaboration with Consulting civil engineer, provider, and care team.   Interventions: Inter-disciplinary care team collaboration (see longitudinal plan of care) Evaluation of current treatment plan related to  self management and patient's adherence to plan as established by provider Advised patient to schedule an appointment with PCP for an annual physical exam Provided patient with [facility] in [place] [phone]   SDOH Barriers (Status:  New goal.) Long Term Goal Patient interviewed and SDOH assessment performed        SDOH Interventions    Flowsheet Row Most Recent Value  SDOH Interventions   Food Insecurity Interventions Other (Comment)  [  Care Guide referral for assistance]  Housing Interventions Intervention Not Indicated  Transportation Interventions Intervention Not Indicated     Patient interviewed and  appropriate assessments performed Referred patient to community resources care guide team for assistance with food insecurity and obtaining household products Provided patient with information about applying for food stamps at [email or web address].cz  Oncology:  (New goal.) Long Term Goal  Assessment of understanding of oncology diagnosis:  Assessed patient understanding of cancer diagnosis and recommended treatment plan Reviewed upcoming provider appointments and treatment appointments Assessed available transportation to appointments and treatments. Has consistent/reliable transportation: Yes Assessed support system. Has consistent/reliable family or other support: Yes Nutrition assessment performed  Patient Goals/Self-Care Activities: Take medications as prescribed   Attend all scheduled provider appointments Call pharmacy for medication refills 3-7 days in advance of running out of medications Perform all self care activities independently  Perform IADL's (shopping, preparing meals, housekeeping, managing finances) independently Call provider office for new concerns or questions

## 2021-07-14 ENCOUNTER — Inpatient Hospital Stay: Payer: Medicaid Other | Attending: Oncology

## 2021-07-14 ENCOUNTER — Inpatient Hospital Stay (HOSPITAL_BASED_OUTPATIENT_CLINIC_OR_DEPARTMENT_OTHER): Payer: Medicaid Other | Admitting: Oncology

## 2021-07-14 VITALS — BP 90/64 | HR 82 | Temp 97.3°F | Resp 16 | Wt 140.9 lb

## 2021-07-14 DIAGNOSIS — D649 Anemia, unspecified: Secondary | ICD-10-CM | POA: Insufficient documentation

## 2021-07-14 DIAGNOSIS — C19 Malignant neoplasm of rectosigmoid junction: Secondary | ICD-10-CM

## 2021-07-14 DIAGNOSIS — Z8049 Family history of malignant neoplasm of other genital organs: Secondary | ICD-10-CM | POA: Diagnosis not present

## 2021-07-14 DIAGNOSIS — Z8249 Family history of ischemic heart disease and other diseases of the circulatory system: Secondary | ICD-10-CM | POA: Insufficient documentation

## 2021-07-14 DIAGNOSIS — R531 Weakness: Secondary | ICD-10-CM | POA: Diagnosis not present

## 2021-07-14 DIAGNOSIS — R160 Hepatomegaly, not elsewhere classified: Secondary | ICD-10-CM | POA: Insufficient documentation

## 2021-07-14 DIAGNOSIS — Z800 Family history of malignant neoplasm of digestive organs: Secondary | ICD-10-CM | POA: Diagnosis not present

## 2021-07-14 DIAGNOSIS — Z823 Family history of stroke: Secondary | ICD-10-CM | POA: Insufficient documentation

## 2021-07-14 DIAGNOSIS — C787 Secondary malignant neoplasm of liver and intrahepatic bile duct: Secondary | ICD-10-CM | POA: Insufficient documentation

## 2021-07-14 DIAGNOSIS — G629 Polyneuropathy, unspecified: Secondary | ICD-10-CM | POA: Insufficient documentation

## 2021-07-14 DIAGNOSIS — K921 Melena: Secondary | ICD-10-CM | POA: Insufficient documentation

## 2021-07-14 DIAGNOSIS — Z809 Family history of malignant neoplasm, unspecified: Secondary | ICD-10-CM | POA: Diagnosis not present

## 2021-07-14 DIAGNOSIS — N289 Disorder of kidney and ureter, unspecified: Secondary | ICD-10-CM | POA: Diagnosis not present

## 2021-07-14 DIAGNOSIS — Z79899 Other long term (current) drug therapy: Secondary | ICD-10-CM | POA: Insufficient documentation

## 2021-07-14 DIAGNOSIS — R5383 Other fatigue: Secondary | ICD-10-CM | POA: Insufficient documentation

## 2021-07-14 DIAGNOSIS — Z833 Family history of diabetes mellitus: Secondary | ICD-10-CM | POA: Diagnosis not present

## 2021-07-14 DIAGNOSIS — Z87891 Personal history of nicotine dependence: Secondary | ICD-10-CM | POA: Diagnosis not present

## 2021-07-14 LAB — CBC WITH DIFFERENTIAL/PLATELET
Abs Immature Granulocytes: 0.02 10*3/uL (ref 0.00–0.07)
Basophils Absolute: 0 10*3/uL (ref 0.0–0.1)
Basophils Relative: 0 %
Eosinophils Absolute: 0.1 10*3/uL (ref 0.0–0.5)
Eosinophils Relative: 2 %
HCT: 34.9 % — ABNORMAL LOW (ref 36.0–46.0)
Hemoglobin: 11.5 g/dL — ABNORMAL LOW (ref 12.0–15.0)
Immature Granulocytes: 0 %
Lymphocytes Relative: 21 %
Lymphs Abs: 1.7 10*3/uL (ref 0.7–4.0)
MCH: 32.3 pg (ref 26.0–34.0)
MCHC: 33 g/dL (ref 30.0–36.0)
MCV: 98 fL (ref 80.0–100.0)
Monocytes Absolute: 0.5 10*3/uL (ref 0.1–1.0)
Monocytes Relative: 6 %
Neutro Abs: 5.7 10*3/uL (ref 1.7–7.7)
Neutrophils Relative %: 71 %
Platelets: 327 10*3/uL (ref 150–400)
RBC: 3.56 MIL/uL — ABNORMAL LOW (ref 3.87–5.11)
RDW: 13.5 % (ref 11.5–15.5)
WBC: 8 10*3/uL (ref 4.0–10.5)
nRBC: 0 % (ref 0.0–0.2)

## 2021-07-14 LAB — COMPREHENSIVE METABOLIC PANEL
ALT: 10 U/L (ref 0–44)
AST: 17 U/L (ref 15–41)
Albumin: 3.4 g/dL — ABNORMAL LOW (ref 3.5–5.0)
Alkaline Phosphatase: 77 U/L (ref 38–126)
Anion gap: 9 (ref 5–15)
BUN: 16 mg/dL (ref 6–20)
CO2: 27 mmol/L (ref 22–32)
Calcium: 8.7 mg/dL — ABNORMAL LOW (ref 8.9–10.3)
Chloride: 101 mmol/L (ref 98–111)
Creatinine, Ser: 1.17 mg/dL — ABNORMAL HIGH (ref 0.44–1.00)
GFR, Estimated: 56 mL/min — ABNORMAL LOW (ref >60)
Glucose, Bld: 95 mg/dL (ref 70–99)
Potassium: 3.7 mmol/L (ref 3.5–5.1)
Sodium: 137 mmol/L (ref 135–145)
Total Bilirubin: 0.5 mg/dL (ref 0.3–1.2)
Total Protein: 7.7 g/dL (ref 6.5–8.1)

## 2021-07-14 NOTE — Progress Notes (Signed)
Pt reports "not feeling well" last week, poor appetite, pain in abdomen "when digesting food." Denies fevers, nausea/vomiting. Reports symptoms improving but still feeling run down. Pt states y-90 ablation was interrupted by spasms. Tentative plan to rescheduled after first of year.

## 2021-07-15 ENCOUNTER — Telehealth: Payer: Self-pay | Admitting: *Deleted

## 2021-07-15 LAB — CEA: CEA: 8.3 ng/mL — ABNORMAL HIGH (ref 0.0–4.7)

## 2021-07-15 NOTE — Telephone Encounter (Signed)
On 07/14/21- I received incoming message from [place], [place] in triage. Per patient. [employer] has never received the Disability Claims Form. This form was previously sent on 05/07/21 per our records. However, per patient's request. I have refaxed this form. Mychart msg sent to patient to inform pt that form was refaxed.

## 2021-07-17 ENCOUNTER — Other Ambulatory Visit: Payer: Medicaid Other

## 2021-07-28 ENCOUNTER — Telehealth: Payer: Self-pay | Admitting: *Deleted

## 2021-07-28 ENCOUNTER — Other Ambulatory Visit: Payer: Self-pay | Admitting: *Deleted

## 2021-07-28 ENCOUNTER — Encounter: Payer: Self-pay | Admitting: *Deleted

## 2021-07-28 MED ORDER — HYDROCODONE-ACETAMINOPHEN 5-325 MG PO TABS: 1.0000 | ORAL_TABLET | Freq: Four times a day (QID) | ORAL | 0 refills | Status: DC | PRN

## 2021-07-28 NOTE — Telephone Encounter (Signed)
Chester Holstein Key: [id number] - PA Case ID: [id number] - Rx #: [id number]  PA for Norco submitted for patient   Approvedtoday PA Case: [id number], Status: Approved, Coverage Starts on: 07/28/2021 12:00:00 AM, Coverage Ends on: 01/24/2022 12:00:00 AM.

## 2021-07-29 DIAGNOSIS — C19 Malignant neoplasm of rectosigmoid junction: Secondary | ICD-10-CM | POA: Diagnosis not present

## 2021-07-31 ENCOUNTER — Ambulatory Visit
Admission: RE | Admit: 2021-07-31 | Discharge: 2021-07-31 | Disposition: A | Discharge status: Home / Self Care | Payer: Medicaid Other | Source: Ambulatory Visit | Attending: Oncology | Admitting: Oncology

## 2021-07-31 ENCOUNTER — Other Ambulatory Visit: Payer: Self-pay

## 2021-07-31 DIAGNOSIS — C19 Malignant neoplasm of rectosigmoid junction: Secondary | ICD-10-CM | POA: Insufficient documentation

## 2021-07-31 DIAGNOSIS — R109 Unspecified abdominal pain: Secondary | ICD-10-CM | POA: Diagnosis not present

## 2021-07-31 DIAGNOSIS — K769 Liver disease, unspecified: Secondary | ICD-10-CM | POA: Diagnosis not present

## 2021-07-31 MED ORDER — IOHEXOL 300 MG/ML  SOLN
100.0000 mL | Freq: Once | INTRAMUSCULAR | Status: AC | PRN
  Administered 2021-07-31: 13:00:00 100 mL via INTRAVENOUS

## 2021-07-31 NOTE — Patient Instructions (Signed)
Visit Information  Ms. Starlena Beil  - as a part of your Medicaid benefit, you are eligible for care management and care coordination services at no cost or copay. I was unable to reach you by phone today but would be happy to help you with your health related needs. Please feel free to call me @ [phone].   A member of the Managed Medicaid care management team will reach out to you again over the next 7 days.   Mickel Fuchs, BSW, [place] Managed Medicaid Team  [phone]

## 2021-07-31 NOTE — Patient Outreach (Signed)
Care Coordination  07/31/2021  [name] October 01, 1965 [id number]   Medicaid Managed Care   Unsuccessful Outreach Note  07/31/2021 Name: Anita Sullivan MRN: [id number] DOB: 02-Aug-1966  Referred by: [name], NP Reason for referral : High Risk Managed Medicaid (MM Social Work Unsuccessful Telephone Outreach)   An unsuccessful telephone outreach was attempted today. The patient was referred to the case management team for assistance with care management and care coordination.   Follow Up Plan: The care management team will reach out to the patient again over the next 7 days.   Mickel Fuchs, BSW, [place] Managed Medicaid Team  [phone]

## 2021-08-05 ENCOUNTER — Other Ambulatory Visit (HOSPITAL_COMMUNITY): Payer: Self-pay | Admitting: Interventional Radiology

## 2021-08-05 DIAGNOSIS — C787 Secondary malignant neoplasm of liver and intrahepatic bile duct: Secondary | ICD-10-CM

## 2021-08-05 DIAGNOSIS — C189 Malignant neoplasm of colon, unspecified: Secondary | ICD-10-CM

## 2021-08-11 ENCOUNTER — Other Ambulatory Visit: Payer: Self-pay

## 2021-08-11 ENCOUNTER — Telehealth: Payer: Self-pay | Admitting: Emergency Medicine

## 2021-08-11 NOTE — Patient Instructions (Signed)
Visit Information  [name]  - as a part of your Medicaid benefit, you are eligible for care management and care coordination services at no cost or copay. I was unable to reach you by phone today but would be happy to help you with your health related needs. Please feel free to call me @ [phone]  A member of the Managed Medicaid care management team will reach out to you again over the next 7 days.   Mickel Fuchs, BSW, [place] Managed Medicaid Team  [phone]

## 2021-08-11 NOTE — Patient Outreach (Signed)
Care Coordination  08/11/2021  Anita Sullivan 19-Nov-1965 [id number]   Medicaid Managed Care   Unsuccessful Outreach Note  08/11/2021 Name: Anita Sullivan MRN: [id number] DOB: Jul 27, 1966  Referred by: [name], NP Reason for referral : High Risk Managed Medicaid (MM Social Work Unsuccessful Telephone Outreach)   A second unsuccessful telephone outreach was attempted today. The patient was referred to the case management team for assistance with care management and care coordination.   Follow Up Plan: The care management team will reach out to the patient again over the next 7 days.   Marland Kitchenajs

## 2021-08-11 NOTE — Telephone Encounter (Signed)
Opened in error

## 2021-08-13 ENCOUNTER — Other Ambulatory Visit: Payer: Self-pay | Admitting: *Deleted

## 2021-08-13 ENCOUNTER — Other Ambulatory Visit: Payer: Self-pay

## 2021-08-13 NOTE — Patient Instructions (Signed)
Visit Information  Ms. Brazil was given information about Medicaid Managed Care team care coordination services as a part of their Healthy [facility] Medicaid benefit. [name] verbally consented to engagement with the [facility] Managed Care team.   If you are experiencing a medical emergency, please call 911 or report to your local emergency department or urgent care.   If you have a non-emergency medical problem during routine business hours, please contact your provider's office and ask to speak with a nurse.   For questions related to your Healthy [facility] health plan, please call: [phone] or visit the homepage here: [email or web address]  If you would like to schedule transportation through your Healthy [facility] plan, please call the following number at least 2 days in advance of your appointment: [phone]  Call the [place] at [phone], at any time, 24 hours a day, 7 days a week. If you are in danger or need immediate medical attention call 911.  If you would like help to quit smoking, call 1-800-QUIT-NOW (602)471-2874) OR Espaol: 1-855-Djelo-Ya (0-626-948-5462) o para ms informacin haga clic aqu or Text READY to 200-400 to register via text  Ms. Jeannine Boga,   Please see education materials related to food choices provided by MyChart link.  Patient has access to MyChart and can view provided education  Telephone follow up appointment with Managed Medicaid care management team member scheduled for:09/14/21 @ 1:15pm  [name], BSN [place] RN Care Coordinator   Following is a copy of your plan of care:  Care Plan : RN Care Manager Plan of Care  Updates made by [name], RN since 08/13/2021 12:00 AM     Problem: Health Management Needs Related to Metastatic Colorectal Cancer      Long-Range Goal: Development of Plan of Care to Address Health Management  Needs Related to Metastatic Colorectal Cancer   Start Date: 07/13/2021  Expected End Date: 10/11/2021  Priority: High  Note:   Current Barriers:  Chronic Disease Management support and education needs related to metastatic colorectal cancer Ms. Kidd is rescheduled to have mapping on 08/18/21 and radiation on 09/01/21 if mapping goes as planned. She will reach out to [place] for [employer] and denies any needs at this time. She has been unable to log into MyChart.  RNCM Clinical Goal(s):  Patient will verbalize understanding of plan for management of metastatic colorectal cancer as evidenced by patient verbalization and self monitoring activities take all medications exactly as prescribed and will call provider for medication related questions as evidenced by documentation in EMR    attend all scheduled medical appointments: 07/14/21 with Dr. Massie Maroon as evidenced by provider documentation in EMR        continue to work with RN Care Manager and/or Social Worker to address care management and care coordination needs related to metastatic colorectal cancer as evidenced by adherence to CM Team Scheduled appointments     work with [employer] care guide to address needs related to Financial constraints related to food and household products as evidenced by patient and/or community resource care guide support    through collaboration with Consulting civil engineer, provider, and care team.   Interventions: Inter-disciplinary care team collaboration (see longitudinal plan of care) Evaluation of current treatment plan related to  self management and patient's adherence to plan as established by provider RNCM assisted with helping patient log into MyChart   SDOH Barriers (Status:  Goal on track:  Yes.) Long Term Goal Patient interviewed and SDOH assessment performed        SDOH Interventions    Flowsheet Row Most Recent Value  SDOH Interventions   Social Connections Interventions Intervention  Not Indicated     Patient interviewed and appropriate assessments performed Discussed plans with patient for ongoing care management follow up and provided patient with direct contact information for care management team  Oncology:  (Goal on Track (progressing): YES.) Long Term Goal  Assessment of understanding of oncology diagnosis:  Assessed patient understanding of cancer diagnosis and recommended treatment plan Reviewed upcoming provider appointments and treatment appointments Assessed available transportation to appointments and treatments. Has consistent/reliable transportation: Yes Assessed support system. Has consistent/reliable family or other support: Yes Reviewed medications and discussed  Patient Goals/Self-Care Activities: Take medications as prescribed   Attend all scheduled provider appointments Call pharmacy for medication refills 3-7 days in advance of running out of medications Perform all self care activities independently  Perform IADL's (shopping, preparing meals, housekeeping, managing finances) independently Call provider office for new concerns or questions

## 2021-08-13 NOTE — Patient Outreach (Signed)
Medicaid Managed Care   Nurse Care Manager Note  08/13/2021 Name:  Truly Stankiewicz MRN:  [id number] DOB:  1965-10-14  Anita Sullivan is an 56 y.o. year old female who is a primary patient of [name], NP.  The [facility] Managed Care Coordination team was consulted for assistance with:    Cancer treatment  Ms. Iacobucci was given information about Medicaid Managed Care Coordination team services today. [name] Patient agreed to services and verbal consent obtained.  Engaged with patient by telephone for follow up visit in response to provider referral for case management and/or care coordination services.   Assessments/Interventions:  Review of past medical history, allergies, medications, health status, including review of consultants reports, laboratory and other test data, was performed as part of comprehensive evaluation and provision of chronic care management services.  SDOH (Social Determinants of Health) assessments and interventions performed: SDOH Interventions    Flowsheet Row Most Recent Value  SDOH Interventions   Social Connections Interventions Intervention Not Indicated       Care Plan  No Known Allergies  Medications Reviewed Today     Reviewed by [name], RN (Registered Nurse) on 08/13/21 at 37  Med List Status: <None>   Medication Order Taking? Sig Documenting Provider Last Dose Status Informant  acetaminophen (TYLENOL) 325 MG tablet [id number] Yes Take 650 mg by mouth every 6 (six) hours as needed. [provider] Taking Active   HYDROcodone-acetaminophen (NORCO) 5-325 MG tablet [id number] Yes Take 1 tablet by mouth every 6 (six) hours as needed for moderate pain. [name], MD Taking Active   lidocaine-prilocaine (EMLA) cream [id number] Yes Apply 1 application topically as needed. Apply prior to appointment. Cover with plastic wrap. [name], MD Taking Active   Loperamide HCl (IMODIUM PO) [id number]  Yes Take by mouth. [provider] Taking Active   loratadine (CLARITIN) 10 MG tablet [id number] Yes Take 10 mg by mouth daily. [provider] Taking Active            Med Note (Mattie Nordell A   Mon Jul 13, 2021  1:43 PM) Taking as needed  ondansetron (ZOFRAN) 4 MG tablet [id number] Yes Take 1 tablet (4 mg total) by mouth every 8 (eight) hours as needed for nausea or vomiting. [name], MD Taking Active   pantoprazole (PROTONIX) 40 MG tablet [id number] Yes Take 1 tablet (40 mg total) by mouth 2 (two) times daily. [name], MD Taking Active            Med Note Ilsa Iha Jul 13, 2021  1:45 PM) Taking as needed    Discontinued 06/12/20 0901             Patient Active Problem List   Diagnosis Date Noted   Genetic testing 07/10/2020   Family history of uterine cancer    Family history of pancreatic cancer    Goals of care, counseling/discussion 06/03/2020   Palliative care encounter    Metastatic colorectal cancer ([place])    Rectal bleed 05/25/2020   Bleeding per rectum 05/24/2020   Hydronephrosis    Metabolic acidosis    GI bleed 05/22/2020   Acute renal failure ([place]) 05/22/2020   Obstructive uropathy 05/22/2020   Colitis, acute 05/22/2020    Conditions to be addressed/monitored per PCP order:   Cancer treatment  Care Plan : [place] of Care  Updates made by [name], RN since 08/13/2021 12:00  AM     Problem: Health Management Needs Related to Metastatic Colorectal Cancer      Long-Range Goal: Development of Plan of Care to Address Health Management Needs Related to Metastatic Colorectal Cancer   Start Date: 07/13/2021  Expected End Date: 10/11/2021  Priority: High  Note:   Current Barriers:  Chronic Disease Management support and education needs related to metastatic colorectal cancer Ms. Lupe is rescheduled to have mapping on 08/18/21 and radiation on 09/01/21 if mapping goes as planned. She will reach  out to [place] for [employer] and denies any needs at this time. She has been unable to log into MyChart.  RNCM Clinical Goal(s):  Patient will verbalize understanding of plan for management of metastatic colorectal cancer as evidenced by patient verbalization and self monitoring activities take all medications exactly as prescribed and will call provider for medication related questions as evidenced by documentation in EMR    attend all scheduled medical appointments: 07/14/21 with Dr. Massie Maroon as evidenced by provider documentation in EMR        continue to work with RN Care Manager and/or Social Worker to address care management and care coordination needs related to metastatic colorectal cancer as evidenced by adherence to CM Team Scheduled appointments     work with [employer] care guide to address needs related to Financial constraints related to food and household products as evidenced by patient and/or community resource care guide support    through collaboration with Consulting civil engineer, provider, and care team.   Interventions: Inter-disciplinary care team collaboration (see longitudinal plan of care) Evaluation of current treatment plan related to  self management and patient's adherence to plan as established by provider RNCM assisted with helping patient log into MyChart   SDOH Barriers (Status:  Goal on track:  Yes.) Long Term Goal Patient interviewed and SDOH assessment performed        SDOH Interventions    Flowsheet Row Most Recent Value  SDOH Interventions   Social Connections Interventions Intervention Not Indicated     Patient interviewed and appropriate assessments performed Discussed plans with patient for ongoing care management follow up and provided patient with direct contact information for care management team  Oncology:  (Goal on Track (progressing): YES.) Long Term Goal  Assessment of understanding of oncology diagnosis:  Assessed patient  understanding of cancer diagnosis and recommended treatment plan Reviewed upcoming provider appointments and treatment appointments Assessed available transportation to appointments and treatments. Has consistent/reliable transportation: Yes Assessed support system. Has consistent/reliable family or other support: Yes Reviewed medications and discussed  Patient Goals/Self-Care Activities: Take medications as prescribed   Attend all scheduled provider appointments Call pharmacy for medication refills 3-7 days in advance of running out of medications Perform all self care activities independently  Perform IADL's (shopping, preparing meals, housekeeping, managing finances) independently Call provider office for new concerns or questions        Follow Up:  Patient agrees to Care Plan and Follow-up.  Plan: The Managed Medicaid care management team will reach out to the patient again over the next 30 days.  Date/time of next scheduled RN care management/care coordination outreach:  09/14/21 @ 1:15pm  [name], BSN [place] RN Care Coordinator

## 2021-08-17 ENCOUNTER — Other Ambulatory Visit (HOSPITAL_COMMUNITY): Payer: Self-pay | Admitting: Physician Assistant

## 2021-08-18 ENCOUNTER — Ambulatory Visit (HOSPITAL_COMMUNITY)
Admission: RE | Admit: 2021-08-18 | Discharge: 2021-08-18 | Disposition: A | Discharge status: Home / Self Care | Payer: Medicaid Other | Source: Ambulatory Visit | Attending: Interventional Radiology | Admitting: Interventional Radiology

## 2021-08-18 ENCOUNTER — Other Ambulatory Visit (HOSPITAL_COMMUNITY): Payer: Self-pay | Admitting: Interventional Radiology

## 2021-08-18 ENCOUNTER — Other Ambulatory Visit: Payer: Self-pay

## 2021-08-18 ENCOUNTER — Encounter (HOSPITAL_COMMUNITY): Payer: Self-pay

## 2021-08-18 DIAGNOSIS — C189 Malignant neoplasm of colon, unspecified: Secondary | ICD-10-CM

## 2021-08-18 DIAGNOSIS — R160 Hepatomegaly, not elsewhere classified: Secondary | ICD-10-CM | POA: Insufficient documentation

## 2021-08-18 DIAGNOSIS — C220 Liver cell carcinoma: Secondary | ICD-10-CM | POA: Diagnosis not present

## 2021-08-18 DIAGNOSIS — C787 Secondary malignant neoplasm of liver and intrahepatic bile duct: Secondary | ICD-10-CM

## 2021-08-18 HISTORY — PX: IR US GUIDE VASC ACCESS RIGHT: IMG2390

## 2021-08-18 HISTORY — PX: IR ANGIOGRAM VISCERAL SELECTIVE: IMG657

## 2021-08-18 HISTORY — PX: IR ANGIOGRAM SELECTIVE EACH ADDITIONAL VESSEL: IMG667

## 2021-08-18 LAB — CBC WITH DIFFERENTIAL/PLATELET
Abs Immature Granulocytes: 0.01 10*3/uL (ref 0.00–0.07)
Basophils Absolute: 0 10*3/uL (ref 0.0–0.1)
Basophils Relative: 1 %
Eosinophils Absolute: 0.2 10*3/uL (ref 0.0–0.5)
Eosinophils Relative: 2 %
HCT: 37.7 % (ref 36.0–46.0)
Hemoglobin: 11.8 g/dL — ABNORMAL LOW (ref 12.0–15.0)
Immature Granulocytes: 0 %
Lymphocytes Relative: 17 %
Lymphs Abs: 1.4 10*3/uL (ref 0.7–4.0)
MCH: 31.5 pg (ref 26.0–34.0)
MCHC: 31.3 g/dL (ref 30.0–36.0)
MCV: 100.5 fL — ABNORMAL HIGH (ref 80.0–100.0)
Monocytes Absolute: 0.6 10*3/uL (ref 0.1–1.0)
Monocytes Relative: 7 %
Neutro Abs: 6 10*3/uL (ref 1.7–7.7)
Neutrophils Relative %: 73 %
Platelets: 326 10*3/uL (ref 150–400)
RBC: 3.75 MIL/uL — ABNORMAL LOW (ref 3.87–5.11)
RDW: 13.6 % (ref 11.5–15.5)
WBC: 8.2 10*3/uL (ref 4.0–10.5)
nRBC: 0 % (ref 0.0–0.2)

## 2021-08-18 LAB — COMPREHENSIVE METABOLIC PANEL
ALT: 11 U/L (ref 0–44)
AST: 16 U/L (ref 15–41)
Albumin: 3.3 g/dL — ABNORMAL LOW (ref 3.5–5.0)
Alkaline Phosphatase: 91 U/L (ref 38–126)
Anion gap: 7 (ref 5–15)
BUN: 21 mg/dL — ABNORMAL HIGH (ref 6–20)
CO2: 27 mmol/L (ref 22–32)
Calcium: 8.6 mg/dL — ABNORMAL LOW (ref 8.9–10.3)
Chloride: 103 mmol/L (ref 98–111)
Creatinine, Ser: 1.29 mg/dL — ABNORMAL HIGH (ref 0.44–1.00)
GFR, Estimated: 50 mL/min — ABNORMAL LOW (ref >60)
Glucose, Bld: 100 mg/dL — ABNORMAL HIGH (ref 70–99)
Potassium: 4 mmol/L (ref 3.5–5.1)
Sodium: 137 mmol/L (ref 135–145)
Total Bilirubin: 0.5 mg/dL (ref 0.3–1.2)
Total Protein: 7.7 g/dL (ref 6.5–8.1)

## 2021-08-18 LAB — PROTIME-INR
INR: 1 (ref 0.8–1.2)
Prothrombin Time: 13 seconds (ref 11.4–15.2)

## 2021-08-18 MED ORDER — SODIUM CHLORIDE 0.9 % IV SOLN: INTRAVENOUS | Status: DC

## 2021-08-18 MED ORDER — LIDOCAINE HCL 1 % IJ SOLN
INTRAMUSCULAR | Status: AC
  Filled 2021-08-18: qty 20

## 2021-08-18 MED ORDER — IOHEXOL 300 MG/ML  SOLN
100.0000 mL | Freq: Once | INTRAMUSCULAR | Status: AC | PRN
  Administered 2021-08-18: 100 mL via INTRA_ARTERIAL

## 2021-08-18 MED ORDER — FENTANYL CITRATE (PF) 100 MCG/2ML IJ SOLN
INTRAMUSCULAR | Status: AC
  Filled 2021-08-18: qty 2

## 2021-08-18 MED ORDER — FENTANYL CITRATE (PF) 100 MCG/2ML IJ SOLN
INTRAMUSCULAR | Status: AC | PRN
Start: 2021-08-18 — End: 2021-08-18
  Administered 2021-08-18: 50 ug via INTRAVENOUS

## 2021-08-18 MED ORDER — TECHNETIUM TO 99M ALBUMIN AGGREGATED
4.4000 | Freq: Once | INTRAVENOUS | Status: AC | PRN
  Administered 2021-08-18: 4.4 via INTRAVENOUS

## 2021-08-18 MED ORDER — MIDAZOLAM HCL 2 MG/2ML IJ SOLN
INTRAMUSCULAR | Status: AC | PRN
  Administered 2021-08-18: 1 mg via INTRAVENOUS

## 2021-08-18 MED ORDER — IOHEXOL 300 MG/ML  SOLN
100.0000 mL | Freq: Once | INTRAMUSCULAR | Status: AC | PRN
  Administered 2021-08-18: 40 mL via INTRA_ARTERIAL

## 2021-08-18 MED ORDER — MIDAZOLAM HCL 2 MG/2ML IJ SOLN
INTRAMUSCULAR | Status: AC
  Filled 2021-08-18: qty 4

## 2021-08-18 MED ORDER — MIDAZOLAM HCL 2 MG/2ML IJ SOLN
INTRAMUSCULAR | Status: AC | PRN
  Administered 2021-08-18: .5 mg via INTRAVENOUS

## 2021-08-18 MED ORDER — LIDOCAINE HCL (PF) 1 % IJ SOLN
INTRAMUSCULAR | Status: AC | PRN
  Administered 2021-08-18: 5 mL via INTRADERMAL

## 2021-08-18 MED ORDER — HEPARIN SOD (PORK) LOCK FLUSH 100 UNIT/ML IV SOLN
500.0000 [IU] | INTRAVENOUS | Status: AC | PRN
  Administered 2021-08-18: 500 [IU]
  Filled 2021-08-18: qty 5

## 2021-08-18 MED ORDER — IOHEXOL 300 MG/ML  SOLN
100.0000 mL | Freq: Once | INTRAMUSCULAR | Status: AC | PRN
  Administered 2021-08-18: 20 mL via INTRA_ARTERIAL

## 2021-08-18 NOTE — Procedures (Signed)
Interventional Radiology Procedure Note  Procedure:  1) Hepatic angiogram 2) Hepatic arterial Tc-MAA administration  Findings: Please refer to procedural dictation for full description.  Spontaneous recanalization of the right hepatic artery.  Interval increased number of right hepatic masses, now at least 5.  No discrete left hepatic masses on cone beam CT.  Split left and right lobar Tc-MAA administered from proximal right and left hepatic arteries. 6 Fr angioseal closure of right CFA.  Complications: None immediate.  Estimated Blood Loss: < 5 mL  Recommendations: -4 hour bedrest (2 hours flat (until 13:00) followed by 2 hours head of bed up to 30 degrees (until 15:00). -Follow up in 1-2 weeks for right lobar Y-90.  No left sided administration planned at this time.   [name], MD Pager: [phone]

## 2021-08-18 NOTE — Discharge Instructions (Addendum)
For your next procedure these will be the instructions: Post Y-90 Radioembolization Discharge Instructions (09/01/21)  You have been given a radioactive material during your procedure.  While it is safe for you to be discharged home from the hospital, you need to proceed directly home.    Do not use public transportation, including air travel, lasting more than 2 hours for 1 week.  Avoid crowded public places for 1 week.  Adult visitors should try to avoid close contact with you for 1 week.    Children and pregnant females should not visit or have close contact with you for 1 week.  Items that you touch are not radioactive.  Do not sleep in the same bed as your partner for 1 week, and a condom should be used for sexual activity during the first 24 hours.  Your blood may be radioactive and caution should be used if any bleeding occurs during the recovery period.  Body fluids may be radioactive for 24 hours.  Wash your hands after voiding.  Men should sit to urinate.  Dispose of any soiled materials (flush down toilet or place in trash at home) during the first day.  Drink 6 to 8 glasses of fluids per day for 5 days to hydrate yourself.  If you need to see a doctor during the first week, you must let them know that you were treated with yttrium-90 microspheres, and will be slightly radioactive.  They can call Interventional Radiology [phone] with any questions.

## 2021-08-18 NOTE — Sedation Documentation (Addendum)
Writer transported patient to MI via stretcher. VSS. A&Ox4.  Lesia Hausen, RN

## 2021-08-18 NOTE — H&P (Signed)
Referring Physician(s): DGUYQIHK,V  Supervising Physician: [name]  Patient Status:  Anita Sullivan  Chief Complaint:  Metastatic colon cancer to liver  Subjective: Patient familiar to IR service from bilateral nephrostomies in 2021, liver mass biopsy in 2021, left chest tube placement in 2021, bilateral ureteral stents on 08/15/2020, consultation with Dr. [name] on 04/28/2021 to discuss treatment options for metastatic colon cancer to the liver as well as pre Y 90 hepatic/visceral arteriogram on 06/09/2021.  At that time angiography demonstrated hypervascular masses in the right hepatic lobe consistent with metastatic disease.  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  Latest CT abdomen pelvis on 07/31/2021 revealed:  1. No acute CT findings of the abdomen or pelvis to explain pain. 2. Interval enlargement of multiple liver lesions, consistent with worsened hepatic metastatic disease. 3. Soft tissue nodularity about the rectum, adjacent uterus, and urinary bladder is not significantly changed, with adjacent perirectal and presacral fat stranding and fluid. Findings consistent with post treatment appearance of colorectal malignancy. 4. Bilateral double-J ureteral stent catheters remain in position with formed pigtails, with unchanged mild bilateral hydronephrosis and atrophy of the right renal cortex. 5. Large burden of stool throughout the colon  She presents again today for reattempt at visceral/hepatic arteriogram with test Y 90 dosing.  She currently denies fever, headache, chest pain, dyspnea, cough, vomiting.  She does have some mild diffuse abdominal pain as well as lower back pain and occasional nausea, also with some occasional blood in stool.  Past Medical History:  Diagnosis Date   Cancer associated pain    Colon cancer ([place])    Family history of pancreatic cancer    Family  history of uterine cancer    Past Surgical History:  Procedure Laterality Date   COLONOSCOPY WITH PROPOFOL N/A 05/27/2020   Procedure: COLONOSCOPY WITH PROPOFOL;  Surgeon: [name], MD;  Location: [facility] ENDOSCOPY;  Service: Gastroenterology;  Laterality: N/A;   FLEXIBLE SIGMOIDOSCOPY N/A 05/28/2020   Procedure: FLEXIBLE SIGMOIDOSCOPY;  Surgeon: [name], MD;  Location: [facility] ENDOSCOPY;  Service: Gastroenterology;  Laterality: N/A;   IR 3D INDEPENDENT WKST  06/09/2021   IR ANGIOGRAM SELECTIVE EACH ADDITIONAL VESSEL  06/09/2021   IR ANGIOGRAM SELECTIVE EACH ADDITIONAL VESSEL  06/09/2021   IR ANGIOGRAM SELECTIVE EACH ADDITIONAL VESSEL  06/09/2021   IR ANGIOGRAM VISCERAL SELECTIVE  06/09/2021   IR NEPHROSTOMY EXCHANGE LEFT  07/11/2020   IR NEPHROSTOMY EXCHANGE RIGHT  07/11/2020   IR NEPHROSTOMY PLACEMENT LEFT  05/23/2020   IR NEPHROSTOMY PLACEMENT RIGHT  05/23/2020   IR RADIOLOGIST EVAL & MGMT  04/28/2021   IR URETERAL STENT LEFT NEW ACCESS W/O SEP NEPHROSTOMY CATH  08/15/2020   IR URETERAL STENT RIGHT NEW ACCESS W/O SEP NEPHROSTOMY CATH  08/15/2020   IR US GUIDE VASC ACCESS RIGHT  06/09/2021   PORTACATH PLACEMENT Left 05/29/2020   Procedure: INSERTION PORT-A-CATH;  Surgeon: [name], MD;  Location: [facility] ORS;  Service: General;  Laterality: Left;     Allergies: Patient has no known allergies.  Medications: Prior to Admission medications   Medication Sig Start Date End Date Taking? Authorizing Provider  acetaminophen (TYLENOL) 325 MG tablet Take 650 mg by mouth every 6 (six) hours as needed.   Yes [provider]  HYDROcodone-acetaminophen (NORCO) 5-325 MG tablet Take 1 tablet by mouth every 6 (six) hours as needed for moderate pain. 07/28/21  Yes [name], MD  lidocaine-prilocaine (EMLA) cream Apply 1 application topically as needed. Apply prior to appointment. Cover with plastic wrap. 11/05/20  Yes [name], MD  ondansetron (ZOFRAN) 4 MG  tablet Take 1 tablet (4 mg total) by mouth every 8 (eight) hours as needed for nausea or vomiting. 11/05/20  Yes [name], MD  Loperamide HCl (IMODIUM PO) Take by mouth.    [provider]  loratadine (CLARITIN) 10 MG tablet Take 10 mg by mouth daily.    [provider]  pantoprazole (PROTONIX) 40 MG tablet Take 1 tablet (40 mg total) by mouth 2 (two) times daily. 11/05/20   [name], MD  prochlorperazine (COMPAZINE) 10 MG tablet Take 1 tablet (10 mg total) by mouth every 6 (six) hours as needed (Nausea or vomiting). 06/04/20 06/12/20  [name], MD     Vital Signs: BP 101/65    Pulse 66    Temp 97.8 F (36.6 C) (Oral)    Resp 16    Ht 5' (1.524 m)    Wt 135 lb (61.2 kg)    SpO2 99%    BMI 26.37 kg/m   Physical Exam awake, alert.  Chest clear to auscultation bilaterally.  Left chest wall Port-A-Cath noted.  Heart with regular rate and rhythm.  Abdomen soft, positive bowel sounds, some mild diffuse tenderness to palpation.  Extremities with full range of motion.  Imaging: No results found.  Labs:  CBC: Recent Labs    01/28/21 0947 04/13/21 0953 06/09/21 0846 07/14/21 0958  WBC 4.8 7.3 7.6 8.0  HGB 11.6* 12.7 11.7* 11.5*  HCT 37.1 38.9 35.5* 34.9*  PLT 124* 160 170 327    COAGS: Recent Labs    06/09/21 0846  INR 1.0    BMP: Recent Labs    01/28/21 0947 04/13/21 0953 06/09/21 0846 07/14/21 0958  NA 137 138 138 137  K 4.3 4.2 3.7 3.7  CL 105 104 105 101  CO2 24 26 26 27   GLUCOSE 100* 89 91 95  BUN 23* 21* 22* 16  CALCIUM 8.8* 8.6* 8.7* 8.7*  CREATININE 1.38* 1.16* 1.23* 1.17*  GFRNONAA 46* 56* 53* 56*    LIVER FUNCTION TESTS: Recent Labs    01/28/21 0947 04/13/21 0953 06/09/21 0846 07/14/21 0958  BILITOT 0.7 0.4 0.7 0.5  AST 69* 30 16 17   ALT 50* 22 10 10   ALKPHOS 194* 140* 84 77  PROT 7.3 7.5 7.7 7.7  ALBUMIN 3.3* 3.6 3.5 3.4*    Assessment and Plan: Patient familiar to IR service from bilateral  nephrostomies in 2021, liver mass biopsy in 2021, left chest tube placement in 2021, bilateral ureteral stents on 08/15/2020, consultation with Dr. [name] on 04/28/2021 to discuss treatment options for metastatic colon cancer to the liver as well as pre Y 90 hepatic/visceral arteriogram on 06/09/2021.  At that time angiography demonstrated hypervascular masses in the right hepatic lobe consistent with metastatic disease.  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  Latest CT abdomen pelvis on 07/31/2021 revealed:  1. No acute CT findings of the abdomen or pelvis to explain pain. 2. Interval enlargement of multiple liver lesions, consistent with worsened hepatic metastatic disease. 3. Soft tissue nodularity about the rectum, adjacent uterus, and urinary bladder is not significantly changed, with adjacent perirectal and presacral fat stranding and fluid. Findings consistent with post treatment appearance of colorectal malignancy. 4. Bilateral double-J ureteral stent catheters remain  in position with formed pigtails, with unchanged mild bilateral hydronephrosis and atrophy of the right renal cortex. 5. Large burden of stool throughout the colon  She presents again today for reattempt at visceral/hepatic arteriogram with embolization/ test Y 90 dosing. Risks and benefits of procedure were discussed with the patient including, but not limited to bleeding, infection, vascular injury or contrast induced renal failure.  This interventional procedure involves the use of X-rays and because of the nature of the planned procedure, it is possible that we will have prolonged use of X-ray fluoroscopy.  Potential radiation risks to you include (but are not limited to) the following: - A slightly elevated risk for cancer  several years later in life. This risk is typically less than 0.5% percent. This risk is low in  comparison to the normal incidence of human cancer, which is 33% for women and 50% for men according to the [place]. - Radiation induced injury can include skin redness, resembling a rash, tissue breakdown / ulcers and hair loss (which can be temporary or permanent).   The likelihood of either of these occurring depends on the difficulty of the procedure and whether you are sensitive to radiation due to previous procedures, disease, or genetic conditions.   IF your procedure requires a prolonged use of radiation, you will be notified and given written instructions for further action.  It is your responsibility to monitor the irradiated area for the 2 weeks following the procedure and to notify your physician if you are concerned that you have suffered a radiation induced injury.    All of the patient's questions were answered, patient is agreeable to proceed.  Consent signed and in chart.      Electronically Signed: D. Rowe Robert, PA-C 08/18/2021, 8:37 AM   I spent a total of 25 Minutes at the the patient's bedside AND on the patient's hospital floor or unit, greater than 50% of which was counseling/coordinating care for hepatic/visceral arteriogram with embolization/test Y 90 dosing

## 2021-08-19 LAB — CEA: CEA: 12.7 ng/mL — ABNORMAL HIGH (ref 0.0–4.7)

## 2021-08-20 ENCOUNTER — Other Ambulatory Visit: Payer: Self-pay

## 2021-08-20 ENCOUNTER — Other Ambulatory Visit: Payer: Self-pay | Admitting: *Deleted

## 2021-08-20 MED ORDER — HYDROCODONE-ACETAMINOPHEN 5-325 MG PO TABS: 1.0000 | ORAL_TABLET | Freq: Four times a day (QID) | ORAL | 0 refills | Status: DC | PRN

## 2021-08-20 NOTE — Patient Outreach (Signed)
Medicaid Managed Sullivan Social Work Note  08/20/2021 Name:  Anita Sullivan MRN:  [id number] DOB:  [birth year]  Anita Sullivan is an 56 y.o. year old female who is Sullivan primary patient of [name], NP.  The Medicaid Managed Sullivan Coordination team was consulted for assistance with:  Food [employer] Resources   Anita Sullivan was given information about Medicaid Managed Sullivan Coordination team services today. Anita Sullivan Patient agreed to services and verbal consent obtained.  Engaged with patient  for by telephone forinitial visit in response to referral for case management and/or Sullivan coordination services.   Assessments/Interventions:  Review of past medical history, allergies, medications, health status, including review of consultants reports, laboratory and other test data, was performed as part of comprehensive evaluation and provision of chronic Sullivan management services.  SDOH: (Social Determinant of Health) assessments and interventions performed: 08/20/21: BSW contacted patient regarding financial assistance with food and household items. Patient stated she did get some resources for food but was still in need of household resources. BSW will complete research for Anita Sullivan and provide them to patient in 08/21/21.   Advanced Directives Status:  Not addressed in this encounter.  Sullivan Plan                 No Known Allergies  Medications Reviewed Today     Reviewed by Anita Derrick, RN (Registered Nurse) on 08/18/21 at [id number]  Med List Status: <None>   Medication Order Taking? Sig Documenting Provider Last Dose Status Informant  acetaminophen (TYLENOL) 325 MG tablet [id number] Yes Take 650 mg by mouth every 6 (six) hours as needed. [provider] 08/17/2021 Active   HYDROcodone-acetaminophen (NORCO) 5-325 MG tablet [id number] Yes Take 1 tablet by mouth every 6 (six) hours as needed for moderate pain. Anita Huger, MD Past Week Active    lidocaine-prilocaine (EMLA) cream [id number] Yes Apply 1 application topically as needed. Apply prior to appointment. Cover with plastic wrap. Anita Huger, MD 08/18/2021 0600 Active   Loperamide HCl (IMODIUM PO) [id number] No Take by mouth. [provider] More than Sullivan month Active   loratadine (CLARITIN) 10 MG tablet [id number]  Take 10 mg by mouth daily. [provider]  Active            Med Note Anita Sullivan   Mon Jul 13, 2021  1:43 PM) Taking as needed  ondansetron (ZOFRAN) 4 MG tablet [id number] Yes Take 1 tablet (4 mg total) by mouth every 8 (eight) hours as needed for nausea or vomiting. Anita Huger, MD 08/17/2021 Active   pantoprazole (PROTONIX) 40 MG tablet [id number]  Take 1 tablet (40 mg total) by mouth 2 (two) times daily. Anita Huger, MD  Active            Med Note Anita Sullivan Jul 13, 2021  1:45 PM) Taking as needed            Patient Active Problem List   Diagnosis Date Noted   Genetic testing 07/10/2020   Family history of uterine cancer    Family history of pancreatic cancer    Goals of Sullivan, counseling/discussion 06/03/2020   Palliative Sullivan encounter    Metastatic colorectal cancer ([place])    Rectal bleed 05/25/2020   Bleeding per rectum 05/24/2020   Hydronephrosis    Metabolic acidosis    GI bleed 05/22/2020   Acute renal failure ([place]) 05/22/2020   Obstructive uropathy 05/22/2020  Colitis, acute 05/22/2020    Conditions to be addressed/monitored per PCP order:   community resources  Sullivan Plan : Anita Sullivan  Updates made by Anita Sullivan since 08/20/2021 12:00 AM     Problem: Health Management Needs Related to Metastatic Colorectal Cancer      Long-Range Goal: Development of Plan of Sullivan to Address Health Management Needs Related to Metastatic Colorectal Cancer   Start Date: 07/13/2021  Expected End Date: 10/11/2021  Priority: High  Note:   Current Barriers:  Chronic Disease  Management support and education needs related to metastatic colorectal cancer [name] is rescheduled to have mapping on 08/18/21 and radiation on 09/01/21 if mapping goes as planned. She will reach out to [place] for [employer] and denies any needs at this time. She has been unable to log into MyChart.  RNCM Clinical Goal(s):  Patient will verbalize understanding of plan for management of metastatic colorectal cancer as evidenced by patient verbalization and self monitoring activities take all medications exactly as prescribed and will call provider for medication related questions as evidenced by documentation in EMR    attend all scheduled medical appointments: 07/14/21 with Dr. Massie Maroon as evidenced by provider documentation in EMR        continue to work with RN Sullivan Manager and/or Social Worker to address Sullivan management and Sullivan coordination needs related to metastatic colorectal cancer as evidenced by adherence to CM Team Scheduled appointments     work with community resource Sullivan guide to address needs related to Financial constraints related to food and household products as evidenced by patient and/or community resource Sullivan guide support    through collaboration with Consulting civil engineer, provider, and Sullivan team.   Interventions: Inter-disciplinary Sullivan team collaboration (see longitudinal plan of Sullivan) Evaluation of current treatment plan related to  self management and patient's adherence to plan as established by provider RNCM assisted with helping patient log into MyChart 08/20/21: BSW contacted patient regarding financial assistance with food and household items. Patient stated she did get some resources for food but was still in need of household resources. BSW will complete research for [facility] and provide them to patient in 08/21/21.   SDOH Barriers (Status:  Goal on track:  Yes.) Long Term Goal Patient interviewed and SDOH assessment performed        SDOH  Interventions    Flowsheet Row Most Recent Value  SDOH Interventions   Social Connections Interventions Intervention Not Indicated     Patient interviewed and appropriate assessments performed Discussed plans with patient for ongoing Sullivan management follow up and provided patient with direct contact information for Sullivan management team  Oncology:  (Goal on Track (progressing): YES.) Long Term Goal  Assessment of understanding of oncology diagnosis:  Assessed patient understanding of cancer diagnosis and recommended treatment plan Reviewed upcoming provider appointments and treatment appointments Assessed available transportation to appointments and treatments. Has consistent/reliable transportation: Yes Assessed support system. Has consistent/reliable family or other support: Yes Reviewed medications and discussed  Patient Goals/Self-Sullivan Activities: Take medications as prescribed   Attend all scheduled provider appointments Call pharmacy for medication refills 3-7 days in advance of running out of medications Perform all self Sullivan activities independently  Perform IADL's (shopping, preparing meals, housekeeping, managing finances) independently Call provider office for new concerns or questions        Follow up:  Patient agrees to Sullivan Plan and Follow-up.  Plan: The Managed Medicaid Sullivan management team  will reach out to the patient again over the next 1 days.  Date/time of next scheduled Social Work Sullivan management/Sullivan coordination outreach:  08/21/21  Mickel Fuchs, Arita Miss, [place] Medicaid Team  [phone]

## 2021-08-20 NOTE — Patient Instructions (Signed)
Visit Information  Anita Sullivan was given information about Medicaid Managed Care team care coordination services as a part of their Healthy [facility] Medicaid benefit. Anita Sullivan verbally consented to engagement with the [facility] Managed Care team.   If you are experiencing a medical emergency, please call 911 or report to your local emergency department or urgent care.   If you have a non-emergency medical problem during routine business hours, please contact your provider's office and ask to speak with a nurse.   For questions related to your Healthy [facility] health plan, please call: [phone] or visit the homepage here: [email or web address]  If you would like to schedule transportation through your Healthy [facility] plan, please call the following number at least 2 days in advance of your appointment: [phone]  Call the [place] at [phone], at any time, 24 hours a day, 7 days a week. If you are in danger or need immediate medical attention call 911.  If you would like help to quit smoking, call 1-800-QUIT-NOW 406-400-1621) OR Espaol: 1-855-Djelo-Ya (5-176-160-7371) o para ms informacin haga clic aqu or Text READY to 200-400 to register via text  Anita Sullivan - following are the goals we discussed in your visit today:   Goals Addressed   None     Social Worker will follow up in 1 day.   Anita Sullivan, BSW, [place]  High Risk Managed Medicaid Team  [phone]  Following is a copy of your plan of care:  Care Plan : [place] of Care  Updates made by [name] since 08/20/2021 12:00 AM     Problem: Health Management Needs Related to Metastatic Colorectal Cancer      Long-Range Goal: Development of Plan of Care to Address Health Management Needs Related to Metastatic Colorectal Cancer   Start Date: 07/13/2021  Expected End Date:  10/11/2021  Priority: High  Note:   Current Barriers:  Chronic Disease Management support and education needs related to metastatic colorectal cancer Anita Sullivan is rescheduled to have mapping on 08/18/21 and radiation on 09/01/21 if mapping goes as planned. She will reach out to [place] for [employer] and denies any needs at this time. She has been unable to log into MyChart.  RNCM Clinical Goal(s):  Patient will verbalize understanding of plan for management of metastatic colorectal cancer as evidenced by patient verbalization and self monitoring activities take all medications exactly as prescribed and will call provider for medication related questions as evidenced by documentation in EMR    attend all scheduled medical appointments: 07/14/21 with Dr. Massie Maroon as evidenced by provider documentation in EMR        continue to work with RN Care Manager and/or Social Worker to address care management and care coordination needs related to metastatic colorectal cancer as evidenced by adherence to CM Team Scheduled appointments     work with community resource care guide to address needs related to Financial constraints related to food and household products as evidenced by patient and/or community resource care guide support    through collaboration with Consulting civil engineer, provider, and care team.   Interventions: Inter-disciplinary care team collaboration (see longitudinal plan of care) Evaluation of current treatment plan related to  self management and patient's adherence to plan as established by provider RNCM assisted with helping patient log into MyChart 08/20/21: BSW contacted patient regarding financial assistance with food and household items. Patient stated  she did get some resources for food but was still in need of household resources. BSW will complete research for [facility] and provide them to patient in 08/21/21.   SDOH Barriers (Status:  Goal on track:  Yes.) Long Term  Goal Patient interviewed and SDOH assessment performed        SDOH Interventions    Flowsheet Row Most Recent Value  SDOH Interventions   Social Connections Interventions Intervention Not Indicated     Patient interviewed and appropriate assessments performed Discussed plans with patient for ongoing care management follow up and provided patient with direct contact information for care management team  Oncology:  (Goal on Track (progressing): YES.) Long Term Goal  Assessment of understanding of oncology diagnosis:  Assessed patient understanding of cancer diagnosis and recommended treatment plan Reviewed upcoming provider appointments and treatment appointments Assessed available transportation to appointments and treatments. Has consistent/reliable transportation: Yes Assessed support system. Has consistent/reliable family or other support: Yes Reviewed medications and discussed  Patient Goals/Self-Care Activities: Take medications as prescribed   Attend all scheduled provider appointments Call pharmacy for medication refills 3-7 days in advance of running out of medications Perform all self care activities independently  Perform IADL's (shopping, preparing meals, housekeeping, managing finances) independently Call provider office for new concerns or questions

## 2021-08-21 ENCOUNTER — Other Ambulatory Visit: Payer: Self-pay

## 2021-08-21 NOTE — Patient Outreach (Signed)
Care Coordination  08/21/2021  Anita Sullivan Feb 22, 1966 [id number]   Medicaid Managed Care   Unsuccessful Outreach Note  08/21/2021 Name: Anita Sullivan MRN: [id number] DOB: 1966/05/15  Referred by: [name], NP Reason for referral : High Risk Managed Medicaid (MM Social work unsuccessful telephone outreach)   An unsuccessful telephone outreach was attempted today. The patient was referred to the case management team for assistance with care management and care coordination.   Follow Up Plan: The care management team will reach out to the patient again over the next 14 days.

## 2021-08-21 NOTE — Patient Instructions (Signed)
Visit Information  Ms. Dalinda Heidt  - as a part of your Medicaid benefit, you are eligible for care management and care coordination services at no cost or copay. I was unable to reach you by phone today but would be happy to help you with your health related needs. Please feel free to call me @ [phone] A member of the Managed Medicaid care management team will reach out to you again over the next 14 days.   Mickel Fuchs, BSW, [place] Managed Medicaid Team  [phone]

## 2021-08-24 DIAGNOSIS — C19 Malignant neoplasm of rectosigmoid junction: Secondary | ICD-10-CM | POA: Diagnosis not present

## 2021-08-30 ENCOUNTER — Other Ambulatory Visit: Payer: Self-pay | Admitting: Radiology

## 2021-09-01 ENCOUNTER — Encounter (HOSPITAL_COMMUNITY): Payer: Self-pay

## 2021-09-01 ENCOUNTER — Encounter (HOSPITAL_COMMUNITY)
Admission: RE | Admit: 2021-09-01 | Discharge: 2021-09-01 | Disposition: A | Discharge status: Home / Self Care | Payer: Medicaid Other | Source: Ambulatory Visit | Attending: Interventional Radiology | Admitting: Interventional Radiology

## 2021-09-01 ENCOUNTER — Ambulatory Visit (HOSPITAL_COMMUNITY)
Admission: RE | Admit: 2021-09-01 | Discharge: 2021-09-01 | Disposition: A | Discharge status: Home / Self Care | Payer: Medicaid Other | Source: Ambulatory Visit | Attending: Interventional Radiology | Admitting: Interventional Radiology

## 2021-09-01 ENCOUNTER — Other Ambulatory Visit (HOSPITAL_COMMUNITY): Payer: Self-pay | Admitting: Physician Assistant

## 2021-09-01 ENCOUNTER — Other Ambulatory Visit (HOSPITAL_COMMUNITY): Payer: Self-pay | Admitting: Interventional Radiology

## 2021-09-01 ENCOUNTER — Other Ambulatory Visit: Payer: Self-pay

## 2021-09-01 ENCOUNTER — Encounter (HOSPITAL_COMMUNITY): Payer: Medicaid Other

## 2021-09-01 VITALS — BP 100/57 | HR 65 | Temp 98.1°F | Resp 14 | Ht 60.0 in | Wt 135.0 lb

## 2021-09-01 DIAGNOSIS — C189 Malignant neoplasm of colon, unspecified: Secondary | ICD-10-CM

## 2021-09-01 DIAGNOSIS — C787 Secondary malignant neoplasm of liver and intrahepatic bile duct: Secondary | ICD-10-CM

## 2021-09-01 DIAGNOSIS — C19 Malignant neoplasm of rectosigmoid junction: Secondary | ICD-10-CM

## 2021-09-01 DIAGNOSIS — C220 Liver cell carcinoma: Secondary | ICD-10-CM | POA: Diagnosis not present

## 2021-09-01 HISTORY — PX: IR ANGIOGRAM VISCERAL SELECTIVE: IMG657

## 2021-09-01 HISTORY — PX: IR US GUIDE VASC ACCESS RIGHT: IMG2390

## 2021-09-01 HISTORY — PX: IR EMBO TUMOR ORGAN ISCHEMIA INFARCT INC GUIDE ROADMAPPING: IMG5449

## 2021-09-01 LAB — CBC WITH DIFFERENTIAL/PLATELET
Abs Immature Granulocytes: 0.03 10*3/uL (ref 0.00–0.07)
Basophils Absolute: 0 10*3/uL (ref 0.0–0.1)
Basophils Relative: 0 %
Eosinophils Absolute: 0.2 10*3/uL (ref 0.0–0.5)
Eosinophils Relative: 2 %
HCT: 34.3 % — ABNORMAL LOW (ref 36.0–46.0)
Hemoglobin: 11 g/dL — ABNORMAL LOW (ref 12.0–15.0)
Immature Granulocytes: 0 %
Lymphocytes Relative: 15 %
Lymphs Abs: 1.2 10*3/uL (ref 0.7–4.0)
MCH: 30.9 pg (ref 26.0–34.0)
MCHC: 32.1 g/dL (ref 30.0–36.0)
MCV: 96.3 fL (ref 80.0–100.0)
Monocytes Absolute: 0.5 10*3/uL (ref 0.1–1.0)
Monocytes Relative: 7 %
Neutro Abs: 6 10*3/uL (ref 1.7–7.7)
Neutrophils Relative %: 76 %
Platelets: 322 10*3/uL (ref 150–400)
RBC: 3.56 MIL/uL — ABNORMAL LOW (ref 3.87–5.11)
RDW: 13.2 % (ref 11.5–15.5)
WBC: 7.9 10*3/uL (ref 4.0–10.5)
nRBC: 0 % (ref 0.0–0.2)

## 2021-09-01 LAB — PROTIME-INR
INR: 1 (ref 0.8–1.2)
Prothrombin Time: 13.5 seconds (ref 11.4–15.2)

## 2021-09-01 LAB — COMPREHENSIVE METABOLIC PANEL
ALT: 9 U/L (ref 0–44)
AST: 19 U/L (ref 15–41)
Albumin: 3.4 g/dL — ABNORMAL LOW (ref 3.5–5.0)
Alkaline Phosphatase: 82 U/L (ref 38–126)
Anion gap: 9 (ref 5–15)
BUN: 19 mg/dL (ref 6–20)
CO2: 24 mmol/L (ref 22–32)
Calcium: 8.7 mg/dL — ABNORMAL LOW (ref 8.9–10.3)
Chloride: 104 mmol/L (ref 98–111)
Creatinine, Ser: 1.14 mg/dL — ABNORMAL HIGH (ref 0.44–1.00)
GFR, Estimated: 58 mL/min — ABNORMAL LOW (ref >60)
Glucose, Bld: 100 mg/dL — ABNORMAL HIGH (ref 70–99)
Potassium: 3.6 mmol/L (ref 3.5–5.1)
Sodium: 137 mmol/L (ref 135–145)
Total Bilirubin: 0.6 mg/dL (ref 0.3–1.2)
Total Protein: 7.8 g/dL (ref 6.5–8.1)

## 2021-09-01 MED ORDER — MIDAZOLAM HCL 2 MG/2ML IJ SOLN
INTRAMUSCULAR | Status: AC | PRN
Start: 2021-09-01 — End: 2021-09-01
  Administered 2021-09-01: 1 mg via INTRAVENOUS

## 2021-09-01 MED ORDER — DEXAMETHASONE SODIUM PHOSPHATE 10 MG/ML IJ SOLN
8.0000 mg | Freq: Once | INTRAMUSCULAR | Status: AC
  Administered 2021-09-01: 8 mg via INTRAVENOUS

## 2021-09-01 MED ORDER — PANTOPRAZOLE SODIUM 40 MG IV SOLR
40.0000 mg | Freq: Once | INTRAVENOUS | Status: AC
  Administered 2021-09-01: 40 mg via INTRAVENOUS

## 2021-09-01 MED ORDER — SODIUM CHLORIDE 0.9 % IV SOLN
25.0000 mg | INTRAVENOUS | Status: DC
  Filled 2021-09-01: qty 1

## 2021-09-01 MED ORDER — LIDOCAINE HCL (PF) 1 % IJ SOLN
INTRAMUSCULAR | Status: AC | PRN
  Administered 2021-09-01: 5 mL via INTRADERMAL

## 2021-09-01 MED ORDER — HEPARIN SOD (PORK) LOCK FLUSH 100 UNIT/ML IV SOLN
500.0000 [IU] | INTRAVENOUS | Status: AC | PRN
  Administered 2021-09-01: 500 [IU]
  Filled 2021-09-01: qty 5

## 2021-09-01 MED ORDER — FENTANYL CITRATE (PF) 100 MCG/2ML IJ SOLN
INTRAMUSCULAR | Status: AC | PRN
  Administered 2021-09-01: 25 ug via INTRAVENOUS

## 2021-09-01 MED ORDER — PANTOPRAZOLE SODIUM 40 MG IV SOLR
INTRAVENOUS | Status: AC
  Filled 2021-09-01: qty 40

## 2021-09-01 MED ORDER — SODIUM CHLORIDE 0.9 % IV SOLN
8.0000 mg | Freq: Once | INTRAVENOUS | Status: AC
  Administered 2021-09-01: 8 mg via INTRAVENOUS
  Filled 2021-09-01: qty 4

## 2021-09-01 MED ORDER — SODIUM CHLORIDE 0.9 % IV SOLN: INTRAVENOUS | Status: DC

## 2021-09-01 MED ORDER — DEXAMETHASONE SODIUM PHOSPHATE 10 MG/ML IJ SOLN
INTRAMUSCULAR | Status: AC
  Filled 2021-09-01: qty 1

## 2021-09-01 MED ORDER — FENTANYL CITRATE (PF) 100 MCG/2ML IJ SOLN
INTRAMUSCULAR | Status: AC | PRN
Start: 2021-09-01 — End: 2021-09-01
  Administered 2021-09-01: 25 ug via INTRAVENOUS

## 2021-09-01 MED ORDER — MIDAZOLAM HCL 2 MG/2ML IJ SOLN
INTRAMUSCULAR | Status: AC | PRN
  Administered 2021-09-01: 1 mg via INTRAVENOUS

## 2021-09-01 MED ORDER — IOHEXOL 300 MG/ML  SOLN
50.0000 mL | Freq: Once | INTRAMUSCULAR | Status: AC | PRN
  Administered 2021-09-01: 10 mL via INTRA_ARTERIAL

## 2021-09-01 MED ORDER — LIDOCAINE-PRILOCAINE 2.5-2.5 % EX CREA
TOPICAL_CREAM | Freq: Once | CUTANEOUS | Status: DC
  Filled 2021-09-01: qty 5

## 2021-09-01 MED ORDER — NITROGLYCERIN 2 % TD OINT: 1.0000 [in_us] | TOPICAL_OINTMENT | TRANSDERMAL | Status: DC

## 2021-09-01 MED ORDER — MIDAZOLAM HCL 2 MG/2ML IJ SOLN
INTRAMUSCULAR | Status: AC
  Filled 2021-09-01: qty 2

## 2021-09-01 MED ORDER — SODIUM CHLORIDE 0.9 % IV SOLN
2.0000 g | Freq: Once | INTRAVENOUS | Status: AC
  Administered 2021-09-01: 2 g via INTRAVENOUS
  Filled 2021-09-01: qty 2

## 2021-09-01 MED ORDER — LIDOCAINE HCL (PF) 1 % IJ SOLN
INTRAMUSCULAR | Status: AC
  Filled 2021-09-01: qty 30

## 2021-09-01 MED ORDER — SODIUM CHLORIDE 0.9 % IV SOLN
25.0000 mg | Freq: Once | INTRAVENOUS | Status: AC
  Administered 2021-09-01: 25 mg via INTRAVENOUS
  Filled 2021-09-01: qty 25

## 2021-09-01 MED ORDER — LIDOCAINE HCL (PF) 1 % IJ SOLN
INTRAMUSCULAR | Status: AC | PRN
  Administered 2021-09-01: 10 mL via INTRADERMAL

## 2021-09-01 MED ORDER — FENTANYL CITRATE (PF) 100 MCG/2ML IJ SOLN: INTRAMUSCULAR | Status: AC | PRN

## 2021-09-01 MED ORDER — IOHEXOL 300 MG/ML  SOLN
100.0000 mL | Freq: Once | INTRAMUSCULAR | Status: AC | PRN
  Administered 2021-09-01: 25 mL via INTRA_ARTERIAL

## 2021-09-01 MED ORDER — FENTANYL CITRATE (PF) 100 MCG/2ML IJ SOLN
INTRAMUSCULAR | Status: AC
  Filled 2021-09-01: qty 2

## 2021-09-01 MED ORDER — YTTRIUM 90 INJECTION: 29.3600 | INJECTION | Freq: Once | INTRAVENOUS | Status: DC

## 2021-09-01 NOTE — Progress Notes (Signed)
Nausea improved after phenergan, prior to discharge sipping Gingerale.

## 2021-09-01 NOTE — H&P (Signed)
Referring Physician(s): [id number]  Supervising Physician: [name]  Patient Status:  [facility]  Chief Complaint: Metastatic colon cancer to liver   Subjective: Patient familiar to IR service from bilateral nephrostomies in 2021, liver mass biopsy in 2021, left chest tube placement in 2021, bilateral ureteral stents on 08/15/2020, consultation with Dr. [name] on 04/28/2021 to discuss treatment options for metastatic colon cancer to the liver as well as pre Y 90 hepatic/visceral arteriogram on 06/09/2021.  At that time angiography demonstrated hypervascular masses in the right hepatic lobe consistent with metastatic disease.  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  Latest CT abdomen pelvis on 07/31/2021 revealed:  1. No acute CT findings of the abdomen or pelvis to explain pain. 2. Interval enlargement of multiple liver lesions, consistent with worsened hepatic metastatic disease. 3. Soft tissue nodularity about the rectum, adjacent uterus, and urinary bladder is not significantly changed, with adjacent perirectal and presacral fat stranding and fluid. Findings consistent with post treatment appearance of colorectal malignancy. 4. Bilateral double-J ureteral stent catheters remain in position with formed pigtails, with unchanged mild bilateral hydronephrosis and atrophy of the right renal cortex. 5. Large burden of stool throughout the colon  She underwent successful arterial roadmapping study on 08/18/2021 and presents today for right hepatic lobe Y 90 radioembolization. She denies fever,HA,CP,dyspnea, cough, vomiting . She does have abd discomfort, occ nausea, occ rectal bleeding, occ loose stools.    Past Medical History:  Diagnosis Date   Cancer associated pain    Colon cancer ([place])    Family history of pancreatic cancer    Family history of uterine cancer    Past Surgical  History:  Procedure Laterality Date   COLONOSCOPY WITH PROPOFOL N/A 05/27/2020   Procedure: COLONOSCOPY WITH PROPOFOL;  Surgeon: [name], MD;  Location: [facility] ENDOSCOPY;  Service: Gastroenterology;  Laterality: N/A;   FLEXIBLE SIGMOIDOSCOPY N/A 05/28/2020   Procedure: FLEXIBLE SIGMOIDOSCOPY;  Surgeon: [name], MD;  Location: [facility] ENDOSCOPY;  Service: Gastroenterology;  Laterality: N/A;   IR 3D INDEPENDENT WKST  06/09/2021   IR ANGIOGRAM SELECTIVE EACH ADDITIONAL VESSEL  06/09/2021   IR ANGIOGRAM SELECTIVE EACH ADDITIONAL VESSEL  06/09/2021   IR ANGIOGRAM SELECTIVE EACH ADDITIONAL VESSEL  06/09/2021   IR ANGIOGRAM SELECTIVE EACH ADDITIONAL VESSEL  08/18/2021   IR ANGIOGRAM SELECTIVE EACH ADDITIONAL VESSEL  08/18/2021   IR ANGIOGRAM SELECTIVE EACH ADDITIONAL VESSEL  08/18/2021   IR ANGIOGRAM VISCERAL SELECTIVE  06/09/2021   IR ANGIOGRAM VISCERAL SELECTIVE  08/18/2021   IR NEPHROSTOMY EXCHANGE LEFT  07/11/2020   IR NEPHROSTOMY EXCHANGE RIGHT  07/11/2020   IR NEPHROSTOMY PLACEMENT LEFT  05/23/2020   IR NEPHROSTOMY PLACEMENT RIGHT  05/23/2020   IR RADIOLOGIST EVAL & MGMT  04/28/2021   IR URETERAL STENT LEFT NEW ACCESS W/O SEP NEPHROSTOMY CATH  08/15/2020   IR URETERAL STENT RIGHT NEW ACCESS W/O SEP NEPHROSTOMY CATH  08/15/2020   IR US GUIDE VASC ACCESS RIGHT  06/09/2021   IR US GUIDE VASC ACCESS RIGHT  08/18/2021   PORTACATH PLACEMENT Left 05/29/2020   Procedure: INSERTION PORT-A-CATH;  Surgeon: [name], MD;  Location: [facility] ORS;  Service: General;  Laterality: Left;      Allergies: Patient has no known allergies.  Medications: Prior to Admission medications   Medication Sig Start Date End Date Taking? Authorizing Provider  acetaminophen (TYLENOL) 325 MG tablet Take 650 mg by mouth every 6 (  six) hours as needed.    [provider]  HYDROcodone-acetaminophen (NORCO) 5-325 MG tablet Take 1 tablet by mouth every 6 (six) hours as needed for moderate pain. 08/20/21    [name], MD  lidocaine-prilocaine (EMLA) cream Apply 1 application topically as needed. Apply prior to appointment. Cover with plastic wrap. 11/05/20   [name], MD  Loperamide HCl (IMODIUM PO) Take by mouth.    [provider]  loratadine (CLARITIN) 10 MG tablet Take 10 mg by mouth daily.    [provider]  ondansetron (ZOFRAN) 4 MG tablet Take 1 tablet (4 mg total) by mouth every 8 (eight) hours as needed for nausea or vomiting. 11/05/20   [name], MD  pantoprazole (PROTONIX) 40 MG tablet Take 1 tablet (40 mg total) by mouth 2 (two) times daily. 11/05/20   [name], MD  prochlorperazine (COMPAZINE) 10 MG tablet Take 1 tablet (10 mg total) by mouth every 6 (six) hours as needed (Nausea or vomiting). 06/04/20 06/12/20  [name], MD     Vital Signs:pending   Physical Exam awake,alert; chest-sl dim BS on right, left clear; intact left chest port a cath with rim of erythema? secondary to chlorhexidine application; abd- soft,+BS, some mild tenderness epigastric/RUQ regions; ext with FROM  Imaging: No results found.  Labs:  CBC: Recent Labs    04/13/21 0953 06/09/21 0846 07/14/21 0958 08/18/21 0817  WBC 7.3 7.6 8.0 8.2  HGB 12.7 11.7* 11.5* 11.8*  HCT 38.9 35.5* 34.9* 37.7  PLT 160 170 327 326    COAGS: Recent Labs    06/09/21 0846 08/18/21 0817  INR 1.0 1.0    BMP: Recent Labs    04/13/21 0953 06/09/21 0846 07/14/21 0958 08/18/21 0817  NA 138 138 137 137  K 4.2 3.7 3.7 4.0  CL 104 105 101 103  CO2 26 26 27 27   GLUCOSE 89 91 95 100*  BUN 21* 22* 16 21*  CALCIUM 8.6* 8.7* 8.7* 8.6*  CREATININE 1.16* 1.23* 1.17* 1.29*  GFRNONAA 56* 53* 56* 50*    LIVER FUNCTION TESTS: Recent Labs    04/13/21 0953 06/09/21 0846 07/14/21 0958 08/18/21 0817  BILITOT 0.4 0.7 0.5 0.5  AST 30 16 17 16   ALT 22 10 10 11   ALKPHOS 140* 84 77 91  PROT 7.5 7.7 7.7 7.7  ALBUMIN 3.6 3.5 3.4* 3.3*    Assessment  and Plan: atient familiar to IR service from bilateral nephrostomies in 2021, liver mass biopsy in 2021, left chest tube placement in 2021, bilateral ureteral stents on 08/15/2020, consultation with Dr. [name] on 04/28/2021 to discuss treatment options for metastatic colon cancer to the liver as well as pre Y 90 hepatic/visceral arteriogram on 06/09/2021.  At that time angiography demonstrated hypervascular masses in the right hepatic lobe consistent with metastatic disease.  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  Latest CT abdomen pelvis on 07/31/2021 revealed:  1. No acute CT findings of the abdomen or pelvis to explain pain. 2. Interval enlargement of multiple liver lesions, consistent with worsened hepatic metastatic disease. 3. Soft tissue nodularity about the rectum, adjacent uterus, and urinary bladder is not significantly changed, with adjacent perirectal and presacral fat stranding and fluid. Findings consistent with post treatment appearance of colorectal malignancy. 4. Bilateral double-J ureteral stent catheters remain in position with formed pigtails, with unchanged mild bilateral hydronephrosis and atrophy of the  right renal cortex. 5. Large burden of stool throughout the colon  She underwent successful arterial roadmapping study on 08/18/2021 and presents today for right hepatic lobe Y 90 radioembolization.Risks and benefits of procedure were discussed with the patient including, but not limited to bleeding, infection, vascular injury or contrast induced renal failure.  This interventional procedure involves the use of X-rays and because of the nature of the planned procedure, it is possible that we will have prolonged use of X-ray fluoroscopy.  Potential radiation risks to you include (but are not limited to) the following: - A slightly elevated risk for cancer  several years later in  life. This risk is typically less than 0.5% percent. This risk is low in comparison to the normal incidence of human cancer, which is 33% for women and 50% for men according to the [place]. - Radiation induced injury can include skin redness, resembling a rash, tissue breakdown / ulcers and hair loss (which can be temporary or permanent).   The likelihood of either of these occurring depends on the difficulty of the procedure and whether you are sensitive to radiation due to previous procedures, disease, or genetic conditions.   IF your procedure requires a prolonged use of radiation, you will be notified and given written instructions for further action.  It is your responsibility to monitor the irradiated area for the 2 weeks following the procedure and to notify your physician if you are concerned that you have suffered a radiation induced injury.    All of the patient's questions were answered, patient is agreeable to proceed.  Consent signed and in chart.   LABS PENDING   Electronically Signed: D. Rowe Robert, PA-C 09/01/2021, 8:33 AM   I spent a total of 25 Minutes at the the patient's bedside AND on the patient's hospital floor or unit, greater than 50% of which was counseling/coordinating care for hepatic/visceral arteriogram with right hepatic lobe Y 90 radioembolization

## 2021-09-01 NOTE — Sedation Documentation (Signed)
Patient transported to MI per protocol via stretcher, accompanied by this RN.   Lesia Hausen, RN

## 2021-09-01 NOTE — Procedures (Signed)
Interventional Radiology Procedure Note  Procedure: Right hepatic transarterial radioembolization  Findings: Please refer to procedural dictation for full description.  Right CFA access with 6 Fr Angioseal closure.  Complications: None immediate  Estimated Blood Loss: < 5 mL  Recommendations: 4 hour bedrest, 2 hours flat (until 12:30) followed by 2 hours HOB up to 30 degrees (12:30-14:30).   [name], MD Pager: [phone]

## 2021-09-01 NOTE — Discharge Instructions (Signed)
Please call Interventional Radiology clinic [phone] with any questions or concerns.  You may remove your dressing and shower tomorrow.    Hepatic Artery Radioembolization, Care After The following information offers guidance on how to care for yourself after your procedure. Your health care provider may also give you more specific instructions. If you have problems or questions, contact your health care provider. What can I expect after the procedure? After the procedure, it is possible to have: A slight fever for 7 to 10 days. This may be accompanied by pain, nausea, or vomiting, which is referred to as post-embolization syndrome. You may be given medicine to help relieve these symptoms. If your fever gets worse, tell your health care provider. Tiredness (fatigue). Loss of appetite. This should gradually improve after about 1 week. Abdominal pain on your right side. Soreness and tenderness in your groin area where the needle and catheter were placed (puncture site). Follow these instructions at home: Puncture site care Follow instructions from your health care provider about how to take care of the puncture site. Make sure you: Wash your hands with soap and water for at least 20 seconds before and after you change your bandage (dressing). If soap and water are not available, use hand sanitizer. Change your dressing as told by your health care provider. Check your puncture site every day for signs of infection. Check for: More redness, swelling, or pain. Fluid or blood. Warmth. Pus or a bad smell. Activity Rest as told by your health care provider. Return to your normal activities as told by your health care provider. Ask your health care provider what activities are safe for you. Avoid sitting for a long time without moving. Get up to take short walks every 1-2 hours. This is important to improve blood flow and breathing. Ask for help if you feel weak or unsteady. If you were given  a sedative during the procedure, it can affect you for several hours. Do not drive or operate machinery until your health care provider says that it is safe. Do not lift anything that is heavier than 10 lb (4.5 kg), or the limit that you are told, until your health care provider says that it is safe. Medicines Take over-the-counter and prescription medicines only as told by your health care provider. Ask your health care provider if the medicine prescribed to you: Requires you to avoid driving or using machinery. Can cause constipation. You may need to take these actions to prevent or treat constipation: Drink enough fluid to keep your urine pale yellow. Take over-the-counter or prescription medicines. Eat foods that are high in fiber, such as beans, whole grains, and fresh fruits and vegetables. Limit foods that are high in fat and processed sugars, such as fried or sweet foods. General instructions Eat frequent, small meals until your appetite returns. Follow instructions from your health care provider about eating or drinking restrictions. Do not take baths, swim, or use a hot tub until your health care provider approves. You may take showers. Wash your puncture site with mild soap and water, and pat the area dry. Wear compression stockings as told by your health care provider. These stockings help to prevent blood clots and reduce swelling in your legs. Keep all follow-up visits. This is important. You may need to have blood tests and imaging tests. Contact a health care provider if: You have any of these signs of infection: More redness, swelling, or pain around your puncture site. Fluid or blood coming from your  puncture site. Warmth coming from your puncture site. Pus or a bad smell coming from your puncture site. You have pain that: Gets worse. Does not get better with medicine. Feels like very bad heartburn. Is in the middle of your abdomen, above your belly button. You have any  signs of infection or liver failure, such as: Your skin or the white parts of your eyes turn yellow (jaundice). The color of your urine changes to dark brown. The color of your stool (feces) changes to light yellow. Your abdominal measurement (girth) increases in a short period of time. You gain more than 5 lb (2.3 kg) in a short period of time. Get help right away if: You have a fever that lasts more than 10 days or is higher than what your health care provider told you to expect. You develop any of the following in your legs: Pain. Swelling. Skin that is cold or pale or turns blue. You have chest pain. You have blood in your vomit, saliva, or stool. You have trouble breathing. These symptoms may represent a serious problem that is an emergency. Do not wait to see if the symptoms will go away. Get medical help right away. Call your local emergency services (911 in the [place] Do not drive yourself to the hospital. Summary After the procedure, it is possible to have a slight fever for up to 7-10 days, tiredness, loss of appetite, abdominal pain on the right side, and groin tenderness where the catheter was placed. Do not come in close contact with people for up to a week after your procedure, as told by your health care provider. Follow instructions from your health care provider about how to take care of the puncture site. Contact a health care provider if you have any signs of infection. Get help right away if you develop pain or swelling in your legs or if your legs feel cool or look pale. This information is not intended to replace advice given to you by your health care provider. Make sure you discuss any questions you have with your health care provider. Document Revised: 06/22/2020 Document Reviewed: 06/22/2020 Elsevier Patient Education  2022 [place].      Moderate Conscious Sedation, Adult, Care After This sheet gives you information about how to care for yourself after  your procedure. Your health care provider may also give you more specific instructions. If you have problems or questions, contact your health care provider. What can I expect after the procedure? After the procedure, it is common to have: Sleepiness for several hours. Impaired judgment for several hours. Difficulty with balance. Vomiting if you eat too soon. Follow these instructions at home: For the time period you were told by your health care provider: Rest. Do not participate in activities where you could fall or become injured. Do not drive or use machinery. Do not drink alcohol. Do not take sleeping pills or medicines that cause drowsiness. Do not make important decisions or sign legal documents. Do not take care of children on your own.      Eating and drinking Follow the diet recommended by your health care provider. Drink enough fluid to keep your urine pale yellow. If you vomit: Drink water, juice, or soup when you can drink without vomiting. Make sure you have little or no nausea before eating solid foods.   General instructions Take over-the-counter and prescription medicines only as told by your health care provider. Have a responsible adult stay with you for the time  you are told. It is important to have someone help care for you until you are awake and alert. Do not smoke. Keep all follow-up visits as told by your health care provider. This is important. Contact a health care provider if: You are still sleepy or having trouble with balance after 24 hours. You feel light-headed. You keep feeling nauseous or you keep vomiting. You develop a rash. You have a fever. You have redness or swelling around the IV site. Get help right away if: You have trouble breathing. You have new-onset confusion at home. Summary After the procedure, it is common to feel sleepy, have impaired judgment, or feel nauseous if you eat too soon. Rest after you get home. Know the things you  should not do after the procedure. Follow the diet recommended by your health care provider and drink enough fluid to keep your urine pale yellow. Get help right away if you have trouble breathing or new-onset confusion at home. This information is not intended to replace advice given to you by your health care provider. Make sure you discuss any questions you have with your health care provider. Document Revised: 11/16/2019 Document Reviewed: 06/14/2019 Elsevier Patient Education  [place].   Radiation precautions For up to a week after your procedure, there will be a small amount of radioactivity near your liver. This is not especially dangerous to other people. However, as told by your health care provider, you should follow these precautions for 7 days: Do not come in close contact with people. Do not sleep in the same bed as someone else. Do not hold children or babies. Do not have contact with pregnant women.  Post Y-90 Radioembolization Discharge Instructions  You have been given a radioactive material during your procedure.  While it is safe for you to be discharged home from the hospital, you need to proceed directly home.    Do not use public transportation, including air travel, lasting more than 2 hours for 1 week.  Avoid crowded public places for 1 week.  Adult visitors should try to avoid close contact with you for 1 week.    Children and pregnant females should not visit or have close contact with you for 1 week.  Items that you touch are not radioactive.  Do not sleep in the same bed as your partner for 1 week, and a condom should be used for sexual activity during the first 24 hours.  Your blood may be radioactive and caution should be used if any bleeding occurs during the recovery period.  Body fluids may be radioactive for 24 hours.  Wash your hands after voiding.  Men should sit to urinate.  Dispose of any soiled materials (flush down toilet or place in  trash at home) during the first day.  Drink 6 to 8 glasses of fluids per day for 5 days to hydrate yourself.  If you need to see a doctor during the first week, you must let them know that you were treated with yttrium-90 microspheres, and will be slightly radioactive.  They can call Interventional Radiology [phone] with any questions.

## 2021-09-09 ENCOUNTER — Other Ambulatory Visit: Payer: Self-pay | Admitting: *Deleted

## 2021-09-09 DIAGNOSIS — C19 Malignant neoplasm of rectosigmoid junction: Secondary | ICD-10-CM

## 2021-09-10 ENCOUNTER — Other Ambulatory Visit: Payer: Self-pay

## 2021-09-10 NOTE — Patient Outreach (Signed)
Medicaid Managed Care Social Work Note  09/10/2021 Name:  Anita Sullivan MRN:  [id number] DOB:  [birth year]  Anita Sullivan is an 56 y.o. year old female who is a primary patient of Anita Billings, NP.  The Medicaid Managed Care Coordination team was consulted for assistance with:  Community Resources   Anita Sullivan was given information about Medicaid Managed Care Coordination team services today. Anita Sullivan Patient agreed to services and verbal consent obtained.  Engaged with patient  for by telephone forfollow up visit in response to referral for case management and/or care coordination services.   Assessments/Interventions:  Review of past medical history, allergies, medications, health status, including review of consultants reports, laboratory and other test data, was performed as part of comprehensive evaluation and provision of chronic care management services.  SDOH: (Social Determinant of Health) assessments and interventions performed: BSW completed follow up telephone call with patient. She stated she has not been feeling well due to radiation treatment. She states everything is good right now, and no resources are needed.   Advanced Directives Status:  Not addressed in this encounter.  Care Plan                 No Known Allergies  Medications Reviewed Today     Reviewed by Anita Bills, RN (Registered Nurse) on 09/01/21 at [phone]  Med List Status: <None>   Medication Order Taking? Sig Documenting Provider Last Dose Status Informant  acetaminophen (TYLENOL) 325 MG tablet [id number] Yes Take 650 mg by mouth every 6 (six) hours as needed. [provider] Past Week Active   HYDROcodone-acetaminophen (NORCO) 5-325 MG tablet [id number] Yes Take 1 tablet by mouth every 6 (six) hours as needed for moderate pain. Anita Huger, MD 08/31/2021 Active   lidocaine-prilocaine (EMLA) cream [id number] Yes Apply 1 application topically as needed. Apply prior to  appointment. Cover with plastic wrap. Anita Huger, MD 09/01/2021 Active   Loperamide HCl (IMODIUM PO) [id number] No Take by mouth. [provider] More than a month Active   loratadine (CLARITIN) 10 MG tablet [id number] No Take 10 mg by mouth daily. [provider] More than a month Active            Med Note (Sullivan, Anita A   Mon Jul 13, 2021  1:43 PM) Taking as needed  ondansetron (ZOFRAN) 4 MG tablet [id number] Yes Take 1 tablet (4 mg total) by mouth every 8 (eight) hours as needed for nausea or vomiting. Anita Huger, MD Past Week Active   pantoprazole (PROTONIX) 40 MG tablet [id number]  Take 1 tablet (40 mg total) by mouth 2 (two) times daily. Anita Huger, MD  Active            Med Note Anita Sullivan, Anita A   Mon Jul 13, 2021  1:45 PM) Taking as needed            Patient Active Problem List   Diagnosis Date Noted   Genetic testing 07/10/2020   Family history of uterine cancer    Family history of pancreatic cancer    Goals of care, counseling/discussion 06/03/2020   Palliative care encounter    Metastatic colorectal cancer ([place])    Rectal bleed 05/25/2020   Bleeding per rectum 05/24/2020   Hydronephrosis    Metabolic acidosis    GI bleed 05/22/2020   Acute renal failure ([place]) 05/22/2020   Obstructive uropathy 05/22/2020   Colitis, acute 05/22/2020    Conditions to be  addressed/monitored per PCP order:   resources  Care Plan : RN Care Manager Plan of Care  Updates made by Anita Sullivan since 09/10/2021 12:00 AM     Problem: Health Management Needs Related to Metastatic Colorectal Cancer      Long-Range Goal: Development of Plan of Care to Address Health Management Needs Related to Metastatic Colorectal Cancer   Start Date: 07/13/2021  Expected End Date: 10/11/2021  Priority: High  Note:   Current Barriers:  Chronic Disease Management support and education needs related to metastatic colorectal cancer Anita Sullivan is rescheduled to  have mapping on 08/18/21 and radiation on 09/01/21 if mapping goes as planned. She will reach out to Anita Sullivan for [employer] and denies any needs at this time. She has been unable to log into MyChart.  RNCM Clinical Goal(s):  Patient will verbalize understanding of plan for management of metastatic colorectal cancer as evidenced by patient verbalization and self monitoring activities take all medications exactly as prescribed and will call provider for medication related questions as evidenced by documentation in EMR    attend all scheduled medical appointments: 07/14/21 with Dr. Massie Sullivan as evidenced by provider documentation in EMR        continue to work with RN Care Manager and/or Social Worker to address care management and care coordination needs related to metastatic colorectal cancer as evidenced by adherence to CM Team Scheduled appointments     work with community resource care guide to address needs related to Financial constraints related to food and household products as evidenced by patient and/or community resource care guide support    through collaboration with Consulting civil engineer, provider, and care team.   Interventions: Inter-disciplinary care team collaboration (see longitudinal plan of care) Evaluation of current treatment plan related to  self management and patient's adherence to plan as established by provider RNCM assisted with helping patient log into MyChart 08/20/21: BSW contacted patient regarding financial assistance with food and household items. Patient stated she did get some resources for food but was still in need of household resources. BSW will complete research for [facility] and provide them to patient in 08/21/21. 09/10/21: BSW completed follow up telephone call with patient. She stated she has not been feeling well due to radiation treatment. She states everything is good right now, and no resources are needed.    SDOH Barriers (Status:  Goal on track:   Yes.) Long Term Goal Patient interviewed and SDOH assessment performed        SDOH Interventions    Flowsheet Row Most Recent Value  SDOH Interventions   Social Connections Interventions Intervention Not Indicated     Patient interviewed and appropriate assessments performed Discussed plans with patient for ongoing care management follow up and provided patient with direct contact information for care management team  Oncology:  (Goal on Track (progressing): YES.) Long Term Goal  Assessment of understanding of oncology diagnosis:  Assessed patient understanding of cancer diagnosis and recommended treatment plan Reviewed upcoming provider appointments and treatment appointments Assessed available transportation to appointments and treatments. Has consistent/reliable transportation: Yes Assessed support system. Has consistent/reliable family or other support: Yes Reviewed medications and discussed  Patient Goals/Self-Care Activities: Take medications as prescribed   Attend all scheduled provider appointments Call pharmacy for medication refills 3-7 days in advance of running out of medications Perform all self care activities independently  Perform IADL's (shopping, preparing meals, housekeeping, managing finances) independently Call provider office for new concerns or questions  Follow up:  Patient agrees to Care Plan and Follow-up.  Plan: The Managed Medicaid care management team will reach out to the patient again over the next 30 days.    Anita Sullivan, BSW, [place] Managed Medicaid Team  [phone]

## 2021-09-10 NOTE — Patient Instructions (Signed)
Visit Information  [name] was given information about Medicaid Managed Care team care coordination services as a part of their Healthy [facility] Medicaid benefit. Anita Sullivan verbally consented to engagement with the [facility] Managed Care team.   If you are experiencing a medical emergency, please call 911 or report to your local emergency department or urgent care.   If you have a non-emergency medical problem during routine business hours, please contact your provider's office and ask to speak with a nurse.   For questions related to your Healthy [facility] health plan, please call: [phone] or visit the homepage here: [email or web address]  If you would like to schedule transportation through your Healthy [facility] plan, please call the following number at least 2 days in advance of your appointment: [phone]  Call the [facility] at [phone], at any time, 24 hours a day, 7 days a week. If you are in danger or need immediate medical attention call 911.  If you would like help to quit smoking, call 1-800-QUIT-NOW 336-780-1233) OR Espaol: 1-855-Djelo-Ya (1-093-235-5732) o para ms informacin haga clic aqu or Text READY to 200-400 to register via text  Anita Sullivan - following are the goals we discussed in your visit today:   Goals Addressed   None      The Managed Medicaid care management team will reach out to the patient again over the next 30 days.   Mickel Fuchs, BSW, [place]  High Risk Managed Medicaid Team  [phone]   Following is a copy of your plan of care:  Care Plan : [place] of Care  Updates made by [name] since 09/10/2021 12:00 AM     Problem: Health Management Needs Related to Metastatic Colorectal Cancer      Long-Range Goal: Development of Plan of Care to Address Health Management Needs Related to Metastatic Colorectal  Cancer   Start Date: 07/13/2021  Expected End Date: 10/11/2021  Priority: High  Note:   Current Barriers:  Chronic Disease Management support and education needs related to metastatic colorectal cancer [name] is rescheduled to have mapping on 08/18/21 and radiation on 09/01/21 if mapping goes as planned. She will reach out to [name] for [employer] and denies any needs at this time. She has been unable to log into MyChart.  RNCM Clinical Goal(s):  Patient will verbalize understanding of plan for management of metastatic colorectal cancer as evidenced by patient verbalization and self monitoring activities take all medications exactly as prescribed and will call provider for medication related questions as evidenced by documentation in EMR    attend all scheduled medical appointments: 07/14/21 with Dr. Massie Maroon as evidenced by provider documentation in EMR        continue to work with RN Care Manager and/or Social Worker to address care management and care coordination needs related to metastatic colorectal cancer as evidenced by adherence to CM Team Scheduled appointments     work with community resource care guide to address needs related to Financial constraints related to food and household products as evidenced by patient and/or community resource care guide support    through collaboration with Consulting civil engineer, provider, and care team.   Interventions: Inter-disciplinary care team collaboration (see longitudinal plan of care) Evaluation of current treatment plan related to  self management and patient's adherence to plan as established by provider RNCM assisted with helping patient log into MyChart 08/20/21: BSW contacted  patient regarding financial assistance with food and household items. Patient stated she did get some resources for food but was still in need of household resources. BSW will complete research for [facility] and provide them to patient in 08/21/21. 09/10/21: BSW  completed follow up telephone call with patient. She stated she has not been feeling well due to radiation treatment. She states everything is good right now, and no resources are needed.    SDOH Barriers (Status:  Goal on track:  Yes.) Long Term Goal Patient interviewed and SDOH assessment performed        SDOH Interventions    Flowsheet Row Most Recent Value  SDOH Interventions   Social Connections Interventions Intervention Not Indicated     Patient interviewed and appropriate assessments performed Discussed plans with patient for ongoing care management follow up and provided patient with direct contact information for care management team  Oncology:  (Goal on Track (progressing): YES.) Long Term Goal  Assessment of understanding of oncology diagnosis:  Assessed patient understanding of cancer diagnosis and recommended treatment plan Reviewed upcoming provider appointments and treatment appointments Assessed available transportation to appointments and treatments. Has consistent/reliable transportation: Yes Assessed support system. Has consistent/reliable family or other support: Yes Reviewed medications and discussed  Patient Goals/Self-Care Activities: Take medications as prescribed   Attend all scheduled provider appointments Call pharmacy for medication refills 3-7 days in advance of running out of medications Perform all self care activities independently  Perform IADL's (shopping, preparing meals, housekeeping, managing finances) independently Call provider office for new concerns or questions

## 2021-09-14 ENCOUNTER — Other Ambulatory Visit: Payer: Self-pay | Admitting: *Deleted

## 2021-09-14 NOTE — Progress Notes (Signed)
Anita Sullivan  [phone] Fax:(336) [phone]  ID: Anita Sullivan OB: 12-08-1965  MR#: [id number]  ZHG#:[id number]  Patient Care Team: [name], NP as PCP - General (Nurse Practitioner) [name], MD as Consulting Physician (Oncology) [name], RN as Oncology Nurse Navigator [name], RN as Case Manager  CHIEF COMPLAINT: Metastatic colorectal adenocarcinoma, KRAS wild-type  INTERVAL HISTORY: Patient returns to clinic today for repeat laboratory can routine evaluation.  She recently completed her Y90 ablation.  Immediately after her recent procedure, she had increased nausea, low-grade fevers, and pain.  These have all improved, but she continues to have persistent weakness and fatigue.  She also has occasional muscle cramping.  She continues to have a mild peripheral neuropathy, but no other neurologic complaints.  She has a fair appetite.  She denies any chest pain, shortness of breath, cough, or hemoptysis.  She denies any nausea, vomiting, constipation, or diarrhea.  She has no urinary complaints.  Patient offers no further specific complaints today.  REVIEW OF SYSTEMS:   Review of Systems  Constitutional:  Positive for malaise/fatigue. Negative for fever and weight loss.  HENT: Negative.  Negative for congestion.   Respiratory: Negative.  Negative for cough, hemoptysis and shortness of breath.   Cardiovascular: Negative.  Negative for chest pain and leg swelling.  Gastrointestinal:  Positive for blood in stool. Negative for abdominal pain, constipation, nausea and vomiting.  Genitourinary: Negative.  Negative for flank pain and hematuria.  Musculoskeletal: Negative.  Negative for back pain.  Skin: Negative.  Negative for rash.  Neurological:  Positive for tingling, sensory change and weakness. Negative for dizziness, seizures and headaches.  Psychiatric/Behavioral:  The patient is not nervous/anxious.    As per HPI.  Otherwise, a complete review of systems is negative.  PAST MEDICAL HISTORY: Past Medical History:  Diagnosis Date   Cancer associated pain    Colon cancer ([place])    Family history of pancreatic cancer    Family history of uterine cancer     PAST SURGICAL HISTORY: Past Surgical History:  Procedure Laterality Date   COLONOSCOPY WITH PROPOFOL N/A 05/27/2020   Procedure: COLONOSCOPY WITH PROPOFOL;  Surgeon: [name], MD;  Location: [facility] ENDOSCOPY;  Service: Gastroenterology;  Laterality: N/A;   FLEXIBLE SIGMOIDOSCOPY N/A 05/28/2020   Procedure: FLEXIBLE SIGMOIDOSCOPY;  Surgeon: [name], MD;  Location: [facility] ENDOSCOPY;  Service: Gastroenterology;  Laterality: N/A;   IR 3D INDEPENDENT WKST  06/09/2021   IR ANGIOGRAM SELECTIVE EACH ADDITIONAL VESSEL  06/09/2021   IR ANGIOGRAM SELECTIVE EACH ADDITIONAL VESSEL  06/09/2021   IR ANGIOGRAM SELECTIVE EACH ADDITIONAL VESSEL  06/09/2021   IR ANGIOGRAM SELECTIVE EACH ADDITIONAL VESSEL  08/18/2021   IR ANGIOGRAM SELECTIVE EACH ADDITIONAL VESSEL  08/18/2021   IR ANGIOGRAM SELECTIVE EACH ADDITIONAL VESSEL  08/18/2021   IR ANGIOGRAM VISCERAL SELECTIVE  06/09/2021   IR ANGIOGRAM VISCERAL SELECTIVE  08/18/2021   IR ANGIOGRAM VISCERAL SELECTIVE  09/01/2021   IR EMBO TUMOR ORGAN ISCHEMIA INFARCT INC GUIDE ROADMAPPING  09/01/2021   IR NEPHROSTOMY EXCHANGE LEFT  07/11/2020   IR NEPHROSTOMY EXCHANGE RIGHT  07/11/2020   IR NEPHROSTOMY PLACEMENT LEFT  05/23/2020   IR NEPHROSTOMY PLACEMENT RIGHT  05/23/2020   IR RADIOLOGIST EVAL & MGMT  04/28/2021   IR URETERAL STENT LEFT NEW ACCESS W/O SEP NEPHROSTOMY CATH  08/15/2020   IR URETERAL STENT RIGHT NEW ACCESS W/O SEP NEPHROSTOMY CATH  08/15/2020   IR US GUIDE VASC ACCESS RIGHT  06/09/2021   IR US GUIDE VASC ACCESS RIGHT  08/18/2021   IR US GUIDE VASC ACCESS RIGHT  09/01/2021   PORTACATH PLACEMENT Left 05/29/2020   Procedure: INSERTION PORT-A-CATH;  Surgeon: [name], MD;  Location: [facility] ORS;  Service:  General;  Laterality: Left;    FAMILY HISTORY: Family History  Problem Relation Age of Onset   Other Mother        Corticobasal degeneration   Uterine cancer Mother    Diabetes Maternal Grandmother    Stroke Maternal Grandfather    Stroke Paternal Grandfather    Heart disease Father    Pancreatic cancer Maternal Uncle 11   Cancer Paternal Aunt        unk type   Liver cancer Maternal Uncle    Pancreatic cancer Cousin    Cancer Cousin        unk type    ADVANCED DIRECTIVES (Y/N):  N  HEALTH MAINTENANCE: Social History   Tobacco Use   Smoking status: Former    Types: Cigarettes   Smokeless tobacco: Never  Vaping Use   Vaping Use: Never used  Substance Use Topics   Alcohol use: Never   Drug use: Never     Colonoscopy:  PAP:  Bone density:  Lipid panel:  No Known Allergies  Current Outpatient Medications  Medication Sig Dispense Refill   acetaminophen (TYLENOL) 325 MG tablet Take 650 mg by mouth every 6 (six) hours as needed.     cyclobenzaprine (FLEXERIL) 10 MG tablet Take 1 tablet (10 mg total) by mouth 3 (three) times daily as needed for muscle spasms. 60 tablet 1   HYDROcodone-acetaminophen (NORCO) 5-325 MG tablet Take 1 tablet by mouth every 6 (six) hours as needed for moderate pain. 60 tablet 0   lidocaine-prilocaine (EMLA) cream Apply 1 application topically as needed. Apply prior to appointment. Cover with plastic wrap. 30 g 2   Loperamide HCl (IMODIUM PO) Take by mouth.     loratadine (CLARITIN) 10 MG tablet Take 10 mg by mouth daily.     ondansetron (ZOFRAN) 4 MG tablet Take 1 tablet (4 mg total) by mouth every 8 (eight) hours as needed for nausea or vomiting. 30 tablet 2   pantoprazole (PROTONIX) 40 MG tablet Take 1 tablet (40 mg total) by mouth 2 (two) times daily. 60 tablet 0   No current facility-administered medications for this visit.    OBJECTIVE: Vitals:   09/15/21 1040  BP: (!) 86/75  Pulse: (!) 111  Resp: 16  Temp: 97.8 F (36.6 C)   SpO2: 100%     Body mass index is 26.03 kg/m.    ECOG FS:0 - Asymptomatic  General: Well-developed, well-nourished, no acute distress. Eyes: Pink conjunctiva, anicteric sclera. HEENT: Normocephalic, moist mucous membranes. Lungs: No audible wheezing or coughing. Heart: Regular rate and rhythm. Abdomen: Soft, nontender, no obvious distention. Musculoskeletal: No edema, cyanosis, or clubbing. Neuro: Alert, answering all questions appropriately. Cranial nerves grossly intact. Skin: No rashes or petechiae noted. Psych: Normal affect.   LAB RESULTS:  Lab Results  Component Value Date   NA 136 09/15/2021   K 4.0 09/15/2021   CL 100 09/15/2021   CO2 26 09/15/2021   GLUCOSE 131 (H) 09/15/2021   BUN 22 (H) 09/15/2021   CREATININE 1.24 (H) 09/15/2021   CALCIUM 8.8 (L) 09/15/2021   PROT 7.7 09/15/2021   ALBUMIN 3.0 (L) 09/15/2021   AST 16 09/15/2021   ALT 7 09/15/2021   ALKPHOS 68 09/15/2021   BILITOT  0.3 09/15/2021   GFRNONAA 52 (L) 09/15/2021    Lab Results  Component Value Date   WBC 12.4 (H) 09/15/2021   NEUTROABS 10.4 (H) 09/15/2021   HGB 12.0 09/15/2021   HCT 37.8 09/15/2021   MCV 96.2 09/15/2021   PLT 335 09/15/2021     STUDIES: NM LIVER TUMOR LOC IMFLAM SPECT 1 DAY  Result Date: 09/01/2021 CLINICAL DATA:  56 year old female with colorectal carcinoma with unresectable liver metastasis. Radioembolization treatment to the RIGHT hepatic lobe. EXAM: NUCLEAR MEDICINE SPECIAL MED RAD PHYSICS CONS; NUCLEAR MEDICINE RADIO PHARM THERAPY INTRA ARTERIAL; NUCLEAR MEDICINE TREATMENT PROCEDURE; NUCLEAR MEDICINE LIVER SCAN TECHNIQUE: In conjunction with the interventional radiologist a Y- Microsphere dose was calculated utilizing body surface area formulation and partition formulation. Post processing volumetric calculations performed. Calculated dose equal 29 mCi. Pre therapy MAA liver SPECT scan and CTA were evaluated. Utilizing a microcatheter system, the hepatic artery was  selected and Y-90 microspheres were delivered in fractionated aliquots. Radiopharmaceutical was delivered by the interventional radiologist and nuclear radiologist. The patient tolerated procedure well. No adverse effects were noted. Bremsstrahlung planar and SPECT imaging of the abdomen following intrahepatic arterial delivery of Y-90 microsphere was performed. RADIOPHARMACEUTICALS:  67.89 millicuries yttrium 90 microspheres COMPARISON:  MAA mapping scan 09/01/2021, CT 07/31/2021 FINDINGS: Y - 90 microspheres therapy as above. First therapy the right hepatic lobe. Bremsstrahlung planar and SPECT imaging of the abdomen following intrahepatic arterial delivery of Y-53mcrosphere demonstrates radioactivity localized to the RIGHT hepatic lobe. No evidence of extrahepatic activity. IMPRESSION: Successful Y - 90 microsphere delivery for treatment of unresectable liver metastasis. First therapy to the RIGHT lobe. Bremssstrahlung scan demonstrates activity localized to RIGHT hepatic lobe with no extrahepatic activity identified. Electronically Signed   By: SSuzy BouchardM.D.   On: 09/01/2021 12:55   NM LIVER TUMOR LOC IMFLAM SPECT 1 DAY  Result Date: 08/18/2021 CLINICAL DATA:  Colorectal carcinoma multifocal hepatic metastasis. Pre radioembolization arterial mapping EXAM: NUCLEAR MEDICINE LIVER SCAN TECHNIQUE: Abdominal images were obtained in multiple projections after intrahepatic arterial injection of radiopharmaceutical. SPECT imaging was performed. Lung shunt calculation was performed. RADIOPHARMACEUTICALS:  4.4 millicuries technetium 90 COMPARISON:  CT 07/31/2021 FINDINGS: The injected microaggregated albumin localizes within the liver. No evidence of activity within the stomach, duodenum, or bowel. Calculated shunt fraction to the lungs equals 3.7%. IMPRESSION: 1. No significant extrahepatic radiotracer activity following intrahepatic arterial injection of MAA. 2. Lung shunt fraction equals 3.7%  Electronically Signed   By: SSuzy BouchardM.D.   On: 08/18/2021 15:42   IR Angiogram Visceral Selective  Result Date: 09/01/2021 INDICATION: 56year old female with history of colorectal cancer metastatic to the liver, right lobe dominant multifocal metastases. EXAM: 1. Ultrasound-guided access of the right common femoral artery. 2. Catheterization angiography of the right hepatic artery. 3. Right hepatic artery radioembolization. MEDICATIONS: Cefoxitin, 2 g, intravenous. The antibiotic was administered within 1 hour of the procedure Additional Medications: 3.375 grams IV Zosyn, 20 mg IV Decadron, 40 mg IV Protonix, 4 mg IV Zofran Y-90 dose: 29.36 mCi ANESTHESIA/SEDATION: Moderate (conscious) sedation was employed during this procedure. A total of Versed 4 mg and Fentanyl 50 mcg was administered intravenously. Moderate Sedation Time: 43 minutes. The patient's level of consciousness and vital signs were monitored continuously by radiology nursing throughout the procedure under my direct supervision. CONTRAST:  269mOMNIPAQUE IOHEXOL 300 MG/ML SOLN, 1088mMNIPAQUE IOHEXOL 300 MG/ML SOLN FLUOROSCOPY TIME:  Fluoroscopy Time: 6 minutes 42 seconds (40 mGy). COMPLICATIONS: None immediate. PROCEDURE: Informed consent was obtained  from the patient following explanation of the procedure, risks, benefits and alternatives. The patient understands, agrees and consents for the procedure. All questions were addressed. A time out was performed prior to the initiation of the procedure. Maximal barrier sterile technique utilized including caps, mask, sterile gowns, sterile gloves, large sterile drape, hand hygiene, and Betadine prep. A preliminary ultrasound of the right groin was performed and demonstrates a patent right common femoral artery. A permanent ultrasound image was recorded. Using a combination of fluoroscopy and ultrasound, an access site was determined. The overlying skin was anesthetized with 1% Lidocaine. A  small skin nick was made and gentle subcutaneous blunt dissection was performed. Using ultrasound guidance, access into the right common femoral artery was obtained with visualization of needle entry into the vessel using standard micropuncture technique. Limited angiogram of the common femoral artery demonstrates appropriate site for a closure device. A 5 French sheath was placed. A C2 catheter was advanced into the celiac artery. Celiac angiography demonstrates conventional anatomy. A 2.8 [place] cantata microcatheter and 0.014 inch synchro soft microwire were used to selectively catheterize the right hepatic artery. Selective angiography demonstrates the known masses in the right hepatic lobe, similar to those visualized on a recent planning angiogram. These findings are consistent with metastases. There is no evidence of non-target extra-hepatic branches. Working with the authorized user Dr. Suzy Bouchard, the previously prescribed Y90 dose was injected into the arterioles supplying the mass. Flow to the lesion was minimally slowed without evidence of reflux. The catheters were withdrawn and stowed by the radiation safety team. All wires and catheters were removed. Hemostasis was achieved using an Angioseal device. There were no immediate complications. Peripheral pulses were unchanged. The patient was transferred to the recovery area in stable condition. IMPRESSION: Technically successful transarterial radioembolization of the right lobe of the liver. [name], MD [facility] Radiology Electronically Signed   By: [name] M.D.   On: 09/01/2021 12:17   IR Angiogram Visceral Selective  Result Date: 08/18/2021 INDICATION: 56 year old female with history of multifocal unilobar colorectal hepatic metastases presenting for hepatic angiogram and technetium 99 MAA administration prior to radioembolization. Prior attempt at mapping study on 06/09/2021 was  thwarted by iatrogenic right hepatic artery dissection. EXAM: 1. Ultrasound-guided access of the right common femoral artery 2. Catheterization and angiography of the celiac, right, and left hepatic arteries 3. Cone beam CT 4. MAA injection into the right and left hepatic arteries MEDICATIONS: None. ANESTHESIA/SEDATION: Moderate (conscious) sedation was employed during this procedure. A total of Versed 2 mg and Fentanyl 50 mcg was administered intravenously. Moderate Sedation Time: 60 minutes. The patient's level of consciousness and vital signs were monitored continuously by radiology nursing throughout the procedure under my direct supervision. CONTRAST:  179m OMNIPAQUE IOHEXOL 300 MG/ML SOLN, 279mOMNIPAQUE IOHEXOL 300 MG/ML SOLN, 4024mMNIPAQUE IOHEXOL 300 MG/ML SOLN FLUOROSCOPY TIME:  Fluoroscopy Time: 4 minutes 42 seconds (170 mGy). COMPLICATIONS: None immediate. PROCEDURE: Informed consent was obtained from the patient following explanation of the procedure, risks, benefits and alternatives. The patient understands, agrees and consents for the procedure. All questions were addressed. A time out was performed prior to the initiation of the procedure. Maximal barrier sterile technique utilized including caps, mask, sterile gowns, sterile gloves, large sterile drape, hand hygiene, and Betadine prep. A preliminary ultrasound of the right groin was performed and demonstrates a patent right common femoral artery. A permanent ultrasound image was recorded. Using a combination of fluoroscopy and ultrasound, an  access site was determined. The overlying skin was anesthetized with 1% Lidocaine. Using ultrasound guidance, access into the right common femoral artery was obtained with visualization of needle entry into the vessel using standard micropuncture technique. Limited angiogram of the common femoral artery demonstrates appropriate site for a closure device. A 5 French sheath was placed. A C2 catheter was advanced  into the celiac artery. Celiac angiography demonstrates conventional anatomy. The portal vein is patent with hepatopedal flow. A Progreat Omega microcatheter and synchro soft microwire were used to select the right hepatic artery and angiography was performed, demonstrating multifocal masses in the right lobe, consistent with known colorectal metastases. Angiography demonstrates supply to the targeted masses. There is no evidence of non-target extra-hepatic branches. Cone beam CT was performed, confirming the above findings. A the microcatheter microwire were then used to select the left hepatic artery. Angiography was performed which demonstrated a single, ill-defined left-sided hepatic mass. Cone beam CT was also performed which confirmed the above findings and did not demonstrate extrahepatic arterial supply. Per protocol, MAA was then infused into the left and right hepatic arteries. All wires and catheters were removed. Hemostasis was achieved using an Angio-Seal device. There were no immediate complications. Peripheral pulses were unchanged. The patient was transferred to the recovery area in stable condition. IMPRESSION: 1. Visceral angiography demonstrating multifocal hypervascular masses in the right hepatic lobe and a possible single left hepatic mass, consistent with known colorectal metastatic disease. 2. MAA infusion into the right and left hepatic arteries. 3. We will follow up shunt fraction calculations and calculate liver volumes in preparation for radioembolization therapy of the right lobe and possibly left sided segmentectomy in the future. [name], MD [facility] Radiology Electronically Signed   By: [name] M.D.   On: 08/18/2021 12:39   IR Angiogram Selective Each Additional Vessel  Result Date: 08/18/2021 INDICATION: 56 year old female with history of multifocal unilobar colorectal hepatic metastases presenting for hepatic  angiogram and technetium 99 MAA administration prior to radioembolization. Prior attempt at mapping study on 06/09/2021 was thwarted by iatrogenic right hepatic artery dissection. EXAM: 1. Ultrasound-guided access of the right common femoral artery 2. Catheterization and angiography of the celiac, right, and left hepatic arteries 3. Cone beam CT 4. MAA injection into the right and left hepatic arteries MEDICATIONS: None. ANESTHESIA/SEDATION: Moderate (conscious) sedation was employed during this procedure. A total of Versed 2 mg and Fentanyl 50 mcg was administered intravenously. Moderate Sedation Time: 60 minutes. The patient's level of consciousness and vital signs were monitored continuously by radiology nursing throughout the procedure under my direct supervision. CONTRAST:  164m OMNIPAQUE IOHEXOL 300 MG/ML SOLN, 231mOMNIPAQUE IOHEXOL 300 MG/ML SOLN, 4034mMNIPAQUE IOHEXOL 300 MG/ML SOLN FLUOROSCOPY TIME:  Fluoroscopy Time: 4 minutes 42 seconds (170 mGy). COMPLICATIONS: None immediate. PROCEDURE: Informed consent was obtained from the patient following explanation of the procedure, risks, benefits and alternatives. The patient understands, agrees and consents for the procedure. All questions were addressed. A time out was performed prior to the initiation of the procedure. Maximal barrier sterile technique utilized including caps, mask, sterile gowns, sterile gloves, large sterile drape, hand hygiene, and Betadine prep. A preliminary ultrasound of the right groin was performed and demonstrates a patent right common femoral artery. A permanent ultrasound image was recorded. Using a combination of fluoroscopy and ultrasound, an access site was determined. The overlying skin was anesthetized with 1% Lidocaine. Using ultrasound guidance, access into the right common femoral artery was obtained  with visualization of needle entry into the vessel using standard micropuncture technique. Limited angiogram of the common  femoral artery demonstrates appropriate site for a closure device. A 5 French sheath was placed. A C2 catheter was advanced into the celiac artery. Celiac angiography demonstrates conventional anatomy. The portal vein is patent with hepatopedal flow. A Progreat Omega microcatheter and synchro soft microwire were used to select the right hepatic artery and angiography was performed, demonstrating multifocal masses in the right lobe, consistent with known colorectal metastases. Angiography demonstrates supply to the targeted masses. There is no evidence of non-target extra-hepatic branches. Cone beam CT was performed, confirming the above findings. A the microcatheter microwire were then used to select the left hepatic artery. Angiography was performed which demonstrated a single, ill-defined left-sided hepatic mass. Cone beam CT was also performed which confirmed the above findings and did not demonstrate extrahepatic arterial supply. Per protocol, MAA was then infused into the left and right hepatic arteries. All wires and catheters were removed. Hemostasis was achieved using an Angio-Seal device. There were no immediate complications. Peripheral pulses were unchanged. The patient was transferred to the recovery area in stable condition. IMPRESSION: 1. Visceral angiography demonstrating multifocal hypervascular masses in the right hepatic lobe and a possible single left hepatic mass, consistent with known colorectal metastatic disease. 2. MAA infusion into the right and left hepatic arteries. 3. We will follow up shunt fraction calculations and calculate liver volumes in preparation for radioembolization therapy of the right lobe and possibly left sided segmentectomy in the future. [name], MD [facility] Radiology Electronically Signed   By: [name] M.D.   On: 08/18/2021 12:39   IR Angiogram Selective Each Additional Vessel  Result Date:  08/18/2021 INDICATION: 56 year old female with history of multifocal unilobar colorectal hepatic metastases presenting for hepatic angiogram and technetium 99 MAA administration prior to radioembolization. Prior attempt at mapping study on 06/09/2021 was thwarted by iatrogenic right hepatic artery dissection. EXAM: 1. Ultrasound-guided access of the right common femoral artery 2. Catheterization and angiography of the celiac, right, and left hepatic arteries 3. Cone beam CT 4. MAA injection into the right and left hepatic arteries MEDICATIONS: None. ANESTHESIA/SEDATION: Moderate (conscious) sedation was employed during this procedure. A total of Versed 2 mg and Fentanyl 50 mcg was administered intravenously. Moderate Sedation Time: 60 minutes. The patient's level of consciousness and vital signs were monitored continuously by radiology nursing throughout the procedure under my direct supervision. CONTRAST:  186m OMNIPAQUE IOHEXOL 300 MG/ML SOLN, 297mOMNIPAQUE IOHEXOL 300 MG/ML SOLN, 4021mMNIPAQUE IOHEXOL 300 MG/ML SOLN FLUOROSCOPY TIME:  Fluoroscopy Time: 4 minutes 42 seconds (170 mGy). COMPLICATIONS: None immediate. PROCEDURE: Informed consent was obtained from the patient following explanation of the procedure, risks, benefits and alternatives. The patient understands, agrees and consents for the procedure. All questions were addressed. A time out was performed prior to the initiation of the procedure. Maximal barrier sterile technique utilized including caps, mask, sterile gowns, sterile gloves, large sterile drape, hand hygiene, and Betadine prep. A preliminary ultrasound of the right groin was performed and demonstrates a patent right common femoral artery. A permanent ultrasound image was recorded. Using a combination of fluoroscopy and ultrasound, an access site was determined. The overlying skin was anesthetized with 1% Lidocaine. Using ultrasound guidance, access into the right common femoral artery was  obtained with visualization of needle entry into the vessel using standard micropuncture technique. Limited angiogram of the common femoral artery demonstrates appropriate site for  a closure device. A 5 French sheath was placed. A C2 catheter was advanced into the celiac artery. Celiac angiography demonstrates conventional anatomy. The portal vein is patent with hepatopedal flow. A Progreat Omega microcatheter and synchro soft microwire were used to select the right hepatic artery and angiography was performed, demonstrating multifocal masses in the right lobe, consistent with known colorectal metastases. Angiography demonstrates supply to the targeted masses. There is no evidence of non-target extra-hepatic branches. Cone beam CT was performed, confirming the above findings. A the microcatheter microwire were then used to select the left hepatic artery. Angiography was performed which demonstrated a single, ill-defined left-sided hepatic mass. Cone beam CT was also performed which confirmed the above findings and did not demonstrate extrahepatic arterial supply. Per protocol, MAA was then infused into the left and right hepatic arteries. All wires and catheters were removed. Hemostasis was achieved using an Angio-Seal device. There were no immediate complications. Peripheral pulses were unchanged. The patient was transferred to the recovery area in stable condition. IMPRESSION: 1. Visceral angiography demonstrating multifocal hypervascular masses in the right hepatic lobe and a possible single left hepatic mass, consistent with known colorectal metastatic disease. 2. MAA infusion into the right and left hepatic arteries. 3. We will follow up shunt fraction calculations and calculate liver volumes in preparation for radioembolization therapy of the right lobe and possibly left sided segmentectomy in the future. [name], MD [facility] Electronically  Signed   By: [name] M.D.   On: 08/18/2021 12:39   IR Angiogram Selective Each Additional Vessel  Result Date: 08/18/2021 INDICATION: 56 year old female with history of multifocal unilobar colorectal hepatic metastases presenting for hepatic angiogram and technetium 99 MAA administration prior to radioembolization. Prior attempt at mapping study on 06/09/2021 was thwarted by iatrogenic right hepatic artery dissection. EXAM: 1. Ultrasound-guided access of the right common femoral artery 2. Catheterization and angiography of the celiac, right, and left hepatic arteries 3. Cone beam CT 4. MAA injection into the right and left hepatic arteries MEDICATIONS: None. ANESTHESIA/SEDATION: Moderate (conscious) sedation was employed during this procedure. A total of Versed 2 mg and Fentanyl 50 mcg was administered intravenously. Moderate Sedation Time: 60 minutes. The patient's level of consciousness and vital signs were monitored continuously by radiology nursing throughout the procedure under my direct supervision. CONTRAST:  164m OMNIPAQUE IOHEXOL 300 MG/ML SOLN, 238mOMNIPAQUE IOHEXOL 300 MG/ML SOLN, 4063mMNIPAQUE IOHEXOL 300 MG/ML SOLN FLUOROSCOPY TIME:  Fluoroscopy Time: 4 minutes 42 seconds (170 mGy). COMPLICATIONS: None immediate. PROCEDURE: Informed consent was obtained from the patient following explanation of the procedure, risks, benefits and alternatives. The patient understands, agrees and consents for the procedure. All questions were addressed. A time out was performed prior to the initiation of the procedure. Maximal barrier sterile technique utilized including caps, mask, sterile gowns, sterile gloves, large sterile drape, hand hygiene, and Betadine prep. A preliminary ultrasound of the right groin was performed and demonstrates a patent right common femoral artery. A permanent ultrasound image was recorded. Using a combination of fluoroscopy and ultrasound, an access site was determined. The  overlying skin was anesthetized with 1% Lidocaine. Using ultrasound guidance, access into the right common femoral artery was obtained with visualization of needle entry into the vessel using standard micropuncture technique. Limited angiogram of the common femoral artery demonstrates appropriate site for a closure device. A 5 French sheath was placed. A C2 catheter was advanced into the celiac artery. Celiac angiography demonstrates conventional anatomy. The  portal vein is patent with hepatopedal flow. A Progreat Omega microcatheter and synchro soft microwire were used to select the right hepatic artery and angiography was performed, demonstrating multifocal masses in the right lobe, consistent with known colorectal metastases. Angiography demonstrates supply to the targeted masses. There is no evidence of non-target extra-hepatic branches. Cone beam CT was performed, confirming the above findings. A the microcatheter microwire were then used to select the left hepatic artery. Angiography was performed which demonstrated a single, ill-defined left-sided hepatic mass. Cone beam CT was also performed which confirmed the above findings and did not demonstrate extrahepatic arterial supply. Per protocol, MAA was then infused into the left and right hepatic arteries. All wires and catheters were removed. Hemostasis was achieved using an Angio-Seal device. There were no immediate complications. Peripheral pulses were unchanged. The patient was transferred to the recovery area in stable condition. IMPRESSION: 1. Visceral angiography demonstrating multifocal hypervascular masses in the right hepatic lobe and a possible single left hepatic mass, consistent with known colorectal metastatic disease. 2. MAA infusion into the right and left hepatic arteries. 3. We will follow up shunt fraction calculations and calculate liver volumes in preparation for radioembolization therapy of the right lobe and possibly left sided  segmentectomy in the future. [name], MD [facility] Radiology Electronically Signed   By: [name] M.D.   On: 08/18/2021 12:39   NM Special Med Rad Physics Cons  Result Date: 09/01/2021 CLINICAL DATA:  56 year old female with colorectal carcinoma with unresectable liver metastasis. Radioembolization treatment to the RIGHT hepatic lobe. EXAM: NUCLEAR MEDICINE SPECIAL MED RAD PHYSICS CONS; NUCLEAR MEDICINE RADIO PHARM THERAPY INTRA ARTERIAL; NUCLEAR MEDICINE TREATMENT PROCEDURE; NUCLEAR MEDICINE LIVER SCAN TECHNIQUE: In conjunction with the interventional radiologist a Y- Microsphere dose was calculated utilizing body surface area formulation and partition formulation. Post processing volumetric calculations performed. Calculated dose equal 29 mCi. Pre therapy MAA liver SPECT scan and CTA were evaluated. Utilizing a microcatheter system, the hepatic artery was selected and Y-90 microspheres were delivered in fractionated aliquots. Radiopharmaceutical was delivered by the interventional radiologist and nuclear radiologist. The patient tolerated procedure well. No adverse effects were noted. Bremsstrahlung planar and SPECT imaging of the abdomen following intrahepatic arterial delivery of Y-90 microsphere was performed. RADIOPHARMACEUTICALS:  75.44 millicuries yttrium 90 microspheres COMPARISON:  MAA mapping scan 09/01/2021, CT 07/31/2021 FINDINGS: Y - 90 microspheres therapy as above. First therapy the right hepatic lobe. Bremsstrahlung planar and SPECT imaging of the abdomen following intrahepatic arterial delivery of Y-59mcrosphere demonstrates radioactivity localized to the RIGHT hepatic lobe. No evidence of extrahepatic activity. IMPRESSION: Successful Y - 90 microsphere delivery for treatment of unresectable liver metastasis. First therapy to the RIGHT lobe. Bremssstrahlung scan demonstrates activity localized to RIGHT hepatic lobe with no  extrahepatic activity identified. Electronically Signed   By: SSuzy BouchardM.D.   On: 09/01/2021 12:55   NM Special Treatment Procedure  Result Date: 09/01/2021 CLINICAL DATA:  56year old female with colorectal carcinoma with unresectable liver metastasis. Radioembolization treatment to the RIGHT hepatic lobe. EXAM: NUCLEAR MEDICINE SPECIAL MED RAD PHYSICS CONS; NUCLEAR MEDICINE RADIO PHARM THERAPY INTRA ARTERIAL; NUCLEAR MEDICINE TREATMENT PROCEDURE; NUCLEAR MEDICINE LIVER SCAN TECHNIQUE: In conjunction with the interventional radiologist a Y- Microsphere dose was calculated utilizing body surface area formulation and partition formulation. Post processing volumetric calculations performed. Calculated dose equal 29 mCi. Pre therapy MAA liver SPECT scan and CTA were evaluated. Utilizing a microcatheter system, the hepatic artery was selected and Y-90 microspheres were  delivered in fractionated aliquots. Radiopharmaceutical was delivered by the interventional radiologist and nuclear radiologist. The patient tolerated procedure well. No adverse effects were noted. Bremsstrahlung planar and SPECT imaging of the abdomen following intrahepatic arterial delivery of Y-90 microsphere was performed. RADIOPHARMACEUTICALS:  16.10 millicuries yttrium 90 microspheres COMPARISON:  MAA mapping scan 09/01/2021, CT 07/31/2021 FINDINGS: Y - 90 microspheres therapy as above. First therapy the right hepatic lobe. Bremsstrahlung planar and SPECT imaging of the abdomen following intrahepatic arterial delivery of Y-52mcrosphere demonstrates radioactivity localized to the RIGHT hepatic lobe. No evidence of extrahepatic activity. IMPRESSION: Successful Y - 90 microsphere delivery for treatment of unresectable liver metastasis. First therapy to the RIGHT lobe. Bremssstrahlung scan demonstrates activity localized to RIGHT hepatic lobe with no extrahepatic activity identified. Electronically Signed   By: SSuzy BouchardM.D.   On:  09/01/2021 12:55   IR [place] Vasc Access Right  Result Date: 09/01/2021 INDICATION: 56year old female with history of colorectal cancer metastatic to the liver, right lobe dominant multifocal metastases. EXAM: 1. Ultrasound-guided access of the right common femoral artery. 2. Catheterization angiography of the right hepatic artery. 3. Right hepatic artery radioembolization. MEDICATIONS: Cefoxitin, 2 g, intravenous. The antibiotic was administered within 1 hour of the procedure Additional Medications: 3.375 grams IV Zosyn, 20 mg IV Decadron, 40 mg IV Protonix, 4 mg IV Zofran Y-90 dose: 29.36 mCi ANESTHESIA/SEDATION: Moderate (conscious) sedation was employed during this procedure. A total of Versed 4 mg and Fentanyl 50 mcg was administered intravenously. Moderate Sedation Time: 43 minutes. The patient's level of consciousness and vital signs were monitored continuously by radiology nursing throughout the procedure under my direct supervision. CONTRAST:  223mOMNIPAQUE IOHEXOL 300 MG/ML SOLN, 1031mMNIPAQUE IOHEXOL 300 MG/ML SOLN FLUOROSCOPY TIME:  Fluoroscopy Time: 6 minutes 42 seconds (40 mGy). COMPLICATIONS: None immediate. PROCEDURE: Informed consent was obtained from the patient following explanation of the procedure, risks, benefits and alternatives. The patient understands, agrees and consents for the procedure. All questions were addressed. A time out was performed prior to the initiation of the procedure. Maximal barrier sterile technique utilized including caps, mask, sterile gowns, sterile gloves, large sterile drape, hand hygiene, and Betadine prep. A preliminary ultrasound of the right groin was performed and demonstrates a patent right common femoral artery. A permanent ultrasound image was recorded. Using a combination of fluoroscopy and ultrasound, an access site was determined. The overlying skin was anesthetized with 1% Lidocaine. A small skin nick was made and gentle subcutaneous blunt  dissection was performed. Using ultrasound guidance, access into the right common femoral artery was obtained with visualization of needle entry into the vessel using standard micropuncture technique. Limited angiogram of the common femoral artery demonstrates appropriate site for a closure device. A 5 French sheath was placed. A C2 catheter was advanced into the celiac artery. Celiac angiography demonstrates conventional anatomy. A 2.8 FrePakistanntata microcatheter and 0.014 inch synchro soft microwire were used to selectively catheterize the right hepatic artery. Selective angiography demonstrates the known masses in the right hepatic lobe, similar to those visualized on a recent planning angiogram. These findings are consistent with metastases. There is no evidence of non-target extra-hepatic branches. Working with the authorized user Dr. SteSuzy Bouchardhe previously prescribed Y90 dose was injected into the arterioles supplying the mass. Flow to the lesion was minimally slowed without evidence of reflux. The catheters were withdrawn and stowed by the radiation safety team. All wires and catheters were removed. Hemostasis was achieved using an Angioseal device. There were no  immediate complications. Peripheral pulses were unchanged. The patient was transferred to the recovery area in stable condition. IMPRESSION: Technically successful transarterial radioembolization of the right lobe of the liver. [name], MD [facility] Radiology Electronically Signed   By: [name] M.D.   On: 09/01/2021 12:17   IR US Guide Vasc Access Right  Result Date: 08/18/2021 INDICATION: 56 year old female with history of multifocal unilobar colorectal hepatic metastases presenting for hepatic angiogram and technetium 99 MAA administration prior to radioembolization. Prior attempt at mapping study on 06/09/2021 was thwarted by iatrogenic right hepatic artery dissection.  EXAM: 1. Ultrasound-guided access of the right common femoral artery 2. Catheterization and angiography of the celiac, right, and left hepatic arteries 3. Cone beam CT 4. MAA injection into the right and left hepatic arteries MEDICATIONS: None. ANESTHESIA/SEDATION: Moderate (conscious) sedation was employed during this procedure. A total of Versed 2 mg and Fentanyl 50 mcg was administered intravenously. Moderate Sedation Time: 60 minutes. The patient's level of consciousness and vital signs were monitored continuously by radiology nursing throughout the procedure under my direct supervision. CONTRAST:  177m OMNIPAQUE IOHEXOL 300 MG/ML SOLN, 271mOMNIPAQUE IOHEXOL 300 MG/ML SOLN, 4045mMNIPAQUE IOHEXOL 300 MG/ML SOLN FLUOROSCOPY TIME:  Fluoroscopy Time: 4 minutes 42 seconds (170 mGy). COMPLICATIONS: None immediate. PROCEDURE: Informed consent was obtained from the patient following explanation of the procedure, risks, benefits and alternatives. The patient understands, agrees and consents for the procedure. All questions were addressed. A time out was performed prior to the initiation of the procedure. Maximal barrier sterile technique utilized including caps, mask, sterile gowns, sterile gloves, large sterile drape, hand hygiene, and Betadine prep. A preliminary ultrasound of the right groin was performed and demonstrates a patent right common femoral artery. A permanent ultrasound image was recorded. Using a combination of fluoroscopy and ultrasound, an access site was determined. The overlying skin was anesthetized with 1% Lidocaine. Using ultrasound guidance, access into the right common femoral artery was obtained with visualization of needle entry into the vessel using standard micropuncture technique. Limited angiogram of the common femoral artery demonstrates appropriate site for a closure device. A 5 French sheath was placed. A C2 catheter was advanced into the celiac artery. Celiac angiography demonstrates  conventional anatomy. The portal vein is patent with hepatopedal flow. A Progreat Omega microcatheter and synchro soft microwire were used to select the right hepatic artery and angiography was performed, demonstrating multifocal masses in the right lobe, consistent with known colorectal metastases. Angiography demonstrates supply to the targeted masses. There is no evidence of non-target extra-hepatic branches. Cone beam CT was performed, confirming the above findings. A the microcatheter microwire were then used to select the left hepatic artery. Angiography was performed which demonstrated a single, ill-defined left-sided hepatic mass. Cone beam CT was also performed which confirmed the above findings and did not demonstrate extrahepatic arterial supply. Per protocol, MAA was then infused into the left and right hepatic arteries. All wires and catheters were removed. Hemostasis was achieved using an Angio-Seal device. There were no immediate complications. Peripheral pulses were unchanged. The patient was transferred to the recovery area in stable condition. IMPRESSION: 1. Visceral angiography demonstrating multifocal hypervascular masses in the right hepatic lobe and a possible single left hepatic mass, consistent with known colorectal metastatic disease. 2. MAA infusion into the right and left hepatic arteries. 3. We will follow up shunt fraction calculations and calculate liver volumes in preparation for radioembolization therapy of the right lobe and possibly left  sided segmentectomy in the future. [name], MD [facility] Radiology Electronically Signed   By: [name] M.D.   On: 08/18/2021 12:39   IR EMBO TUMOR ORGAN ISCHEMIA INFARCT INC GUIDE ROADMAPPING  Result Date: 09/01/2021 INDICATION: 56 year old female with history of colorectal cancer metastatic to the liver, right lobe dominant multifocal metastases. EXAM: 1. Ultrasound-guided access of  the right common femoral artery. 2. Catheterization angiography of the right hepatic artery. 3. Right hepatic artery radioembolization. MEDICATIONS: Cefoxitin, 2 g, intravenous. The antibiotic was administered within 1 hour of the procedure Additional Medications: 3.375 grams IV Zosyn, 20 mg IV Decadron, 40 mg IV Protonix, 4 mg IV Zofran Y-90 dose: 29.36 mCi ANESTHESIA/SEDATION: Moderate (conscious) sedation was employed during this procedure. A total of Versed 4 mg and Fentanyl 50 mcg was administered intravenously. Moderate Sedation Time: 43 minutes. The patient's level of consciousness and vital signs were monitored continuously by radiology nursing throughout the procedure under my direct supervision. CONTRAST:  24m OMNIPAQUE IOHEXOL 300 MG/ML SOLN, 123mOMNIPAQUE IOHEXOL 300 MG/ML SOLN FLUOROSCOPY TIME:  Fluoroscopy Time: 6 minutes 42 seconds (40 mGy). COMPLICATIONS: None immediate. PROCEDURE: Informed consent was obtained from the patient following explanation of the procedure, risks, benefits and alternatives. The patient understands, agrees and consents for the procedure. All questions were addressed. A time out was performed prior to the initiation of the procedure. Maximal barrier sterile technique utilized including caps, mask, sterile gowns, sterile gloves, large sterile drape, hand hygiene, and Betadine prep. A preliminary ultrasound of the right groin was performed and demonstrates a patent right common femoral artery. A permanent ultrasound image was recorded. Using a combination of fluoroscopy and ultrasound, an access site was determined. The overlying skin was anesthetized with 1% Lidocaine. A small skin nick was made and gentle subcutaneous blunt dissection was performed. Using ultrasound guidance, access into the right common femoral artery was obtained with visualization of needle entry into the vessel using standard micropuncture technique. Limited angiogram of the common femoral artery  demonstrates appropriate site for a closure device. A 5 French sheath was placed. A C2 catheter was advanced into the celiac artery. Celiac angiography demonstrates conventional anatomy. A 2.8 Fr[place]ata microcatheter and 0.014 inch synchro soft microwire were used to selectively catheterize the right hepatic artery. Selective angiography demonstrates the known masses in the right hepatic lobe, similar to those visualized on a recent planning angiogram. These findings are consistent with metastases. There is no evidence of non-target extra-hepatic branches. Working with the authorized user Dr. StSuzy Bouchardthe previously prescribed Y90 dose was injected into the arterioles supplying the mass. Flow to the lesion was minimally slowed without evidence of reflux. The catheters were withdrawn and stowed by the radiation safety team. All wires and catheters were removed. Hemostasis was achieved using an Angioseal device. There were no immediate complications. Peripheral pulses were unchanged. The patient was transferred to the recovery area in stable condition. IMPRESSION: Technically successful transarterial radioembolization of the right lobe of the liver. DyRuthann CancerMD [facility] Electron[facility] Signed   By: DyRuthann Cancer.D.   On: 09/01/2021 12:17   NM Radio Pharm Therapy Intraarterial  Result Date: 09/01/2021 CLINICAL DATA:  [age]ear old female with colorectal carcinoma with unresectable liver metastasis. Radioembolization treatment to the RIGHT hepatic lobe. EXAM: NUCLEAR MEDICINE SPECIAL MED RAD PHYSICS CONS; NUCLEAR MEDICINE RADIO PHARM THERAPY INTRA ARTERIAL; NUCLEAR MEDICINE TREATMENT PROCEDURE; NUCLEAR MEDICINE LIVER SCAN TECHNIQUE: In conjunction with the interventional  radiologist a Y- Microsphere dose was calculated utilizing body surface area formulation and partition formulation. Post processing volumetric calculations performed.  Calculated dose equal 29 mCi. Pre therapy MAA liver SPECT scan and CTA were evaluated. Utilizing a microcatheter system, the hepatic artery was selected and Y-90 microspheres were delivered in fractionated aliquots. Radiopharmaceutical was delivered by the interventional radiologist and nuclear radiologist. The patient tolerated procedure well. No adverse effects were noted. Bremsstrahlung planar and SPECT imaging of the abdomen following intrahepatic arterial delivery of Y-90 microsphere was performed. RADIOPHARMACEUTICALS:  95.62 millicuries yttrium 90 microspheres COMPARISON:  MAA mapping scan 09/01/2021, CT 07/31/2021 FINDINGS: Y - 90 microspheres therapy as above. First therapy the right hepatic lobe. Bremsstrahlung planar and SPECT imaging of the abdomen following intrahepatic arterial delivery of Y-4mcrosphere demonstrates radioactivity localized to the RIGHT hepatic lobe. No evidence of extrahepatic activity. IMPRESSION: Successful Y - 90 microsphere delivery for treatment of unresectable liver metastasis. First therapy to the RIGHT lobe. Bremssstrahlung scan demonstrates activity localized to RIGHT hepatic lobe with no extrahepatic activity identified. Electronically Signed   By: SSuzy BouchardM.D.   On: 09/01/2021 12:55   NM FUSION  Result Date: 08/18/2021 INDICATION: 56year old female with history of multifocal unilobar colorectal hepatic metastases presenting for hepatic angiogram and technetium 99 MAA administration prior to radioembolization. Prior attempt at mapping study on 06/09/2021 was thwarted by iatrogenic right hepatic artery dissection. EXAM: 1. Ultrasound-guided access of the right common femoral artery 2. Catheterization and angiography of the celiac, right, and left hepatic arteries 3. Cone beam CT 4. MAA injection into the right and left hepatic arteries MEDICATIONS: None. ANESTHESIA/SEDATION: Moderate (conscious) sedation was employed during this procedure. A total of Versed 2  mg and Fentanyl 50 mcg was administered intravenously. Moderate Sedation Time: 60 minutes. The patient's level of consciousness and vital signs were monitored continuously by radiology nursing throughout the procedure under my direct supervision. CONTRAST:  1074mOMNIPAQUE IOHEXOL 300 MG/ML SOLN, 2072mMNIPAQUE IOHEXOL 300 MG/ML SOLN, 42m8mNIPAQUE IOHEXOL 300 MG/ML SOLN FLUOROSCOPY TIME:  Fluoroscopy Time: 4 minutes 42 seconds (170 mGy). COMPLICATIONS: None immediate. PROCEDURE: Informed consent was obtained from the patient following explanation of the procedure, risks, benefits and alternatives. The patient understands, agrees and consents for the procedure. All questions were addressed. A time out was performed prior to the initiation of the procedure. Maximal barrier sterile technique utilized including caps, mask, sterile gowns, sterile gloves, large sterile drape, hand hygiene, and Betadine prep. A preliminary ultrasound of the right groin was performed and demonstrates a patent right common femoral artery. A permanent ultrasound image was recorded. Using a combination of fluoroscopy and ultrasound, an access site was determined. The overlying skin was anesthetized with 1% Lidocaine. Using ultrasound guidance, access into the right common femoral artery was obtained with visualization of needle entry into the vessel using standard micropuncture technique. Limited angiogram of the common femoral artery demonstrates appropriate site for a closure device. A 5 French sheath was placed. A C2 catheter was advanced into the celiac artery. Celiac angiography demonstrates conventional anatomy. The portal vein is patent with hepatopedal flow. A Progreat Omega microcatheter and synchro soft microwire were used to select the right hepatic artery and angiography was performed, demonstrating multifocal masses in the right lobe, consistent with known colorectal metastases. Angiography demonstrates supply to the targeted  masses. There is no evidence of non-target extra-hepatic branches. Cone beam CT was performed, confirming the above findings. A the microcatheter microwire were then used to select the left hepatic  artery. Angiography was performed which demonstrated a single, ill-defined left-sided hepatic mass. Cone beam CT was also performed which confirmed the above findings and did not demonstrate extrahepatic arterial supply. Per protocol, MAA was then infused into the left and right hepatic arteries. All wires and catheters were removed. Hemostasis was achieved using an Angio-Seal device. There were no immediate complications. Peripheral pulses were unchanged. The patient was transferred to the recovery area in stable condition. IMPRESSION: 1. Visceral angiography demonstrating multifocal hypervascular masses in the right hepatic lobe and a possible single left hepatic mass, consistent with known colorectal metastatic disease. 2. MAA infusion into the right and left hepatic arteries. 3. We will follow up shunt fraction calculations and calculate liver volumes in preparation for radioembolization therapy of the right lobe and possibly left sided segmentectomy in the future. [name], MD [facility] Radiology Electronically Signed   By: [name] M.D.   On: 08/18/2021 12:39    ONCOLOGY HISTORY: Initial plan was to treat with FOLFOX plus Avastin every 2 weeks.  Can consider Panitumumab as second line.  Avastin was held for the first 6 cycles secondary to persistent rectal bleeding.  Patient completed 12 cycles on Dec 12, 2020.  CT scan results from April 14, 2021 reviewed independently with mild progression of disease of 2 known lesions in her liver.  There appeared to be no other evidence of disease.    ASSESSMENT: Metastatic colorectal adenocarcinoma,  KRAS wild-type.  PLAN:   1. Metastatic colorectal adenocarcinoma: .  See oncology history as above. Although  unclear whether malignancy originated in colon or rectum, given the pattern of spread to liver, patient was treated as a stage IV colon cancer.  Patient completed her Y90 treatment on September 01, 2021. Her CEA on that day was 12.7. Patient was noted to have a third lesion in her liver, but this was too small to ablate at this time. No intervention is needed at this time, but patient acknowledged likely will need repeat systemic chemotherapy at some point in the future.  Return to clinic in 3 months with repeat imaging and further evaluation.  2.  Anemia: Resolved. 3.  Renal insufficiency: Chronic and unchanged.  Patient's creatinine is 1.24 today.  4.  Neutropenia: Resolved. Patient required periodic Udenyca with her treatments. 5.  Nephrostomy tubes: Internalized on August 15, 2020. 6.  Thrombocytopenia: Resolved. 7.  Peripheral neuropathy: Chronic and unchanged.  Consider referral to neuro oncology. 8.  Elevated LFTs: Resolved. 9.  Pain: Patient does not complain of this today.  Continue hydrocodone as needed. 10.  Muscle cramping: Patient was given a prescription for Flexeril.    Patient expressed understanding and was in agreement with this plan. She also understands that She can call clinic at any time with any questions, concerns, or complaints.    Cancer Staging  Metastatic colorectal cancer [facility]) Staging form: Colon and Rectum, AJCC 8th Edition - Clinical stage from 05/31/2020: Stage IVC (cTX, cNX, pM1c) - Signed by [name], MD on 05/31/2020   [name], MD   09/15/2021 4:57 PM

## 2021-09-14 NOTE — Patient Instructions (Signed)
Visit Information  Anita Sullivan  - as a part of your Medicaid benefit, you are eligible for care management and care coordination services at no cost or copay. I was unable to reach you by phone today but would be happy to help you with your health related needs. Please feel free to call me @ [phone].   A member of the Managed Medicaid care management team will reach out to you again over the next 14 days.   [name], BSN [place] RN Care Coordinator

## 2021-09-14 NOTE — Patient Outreach (Signed)
Care Coordination  09/14/2021  Anita Sullivan 08-12-1965 [id number]   Medicaid Managed Care   Unsuccessful Outreach Note  09/14/2021 Name: Anita Sullivan MRN: [id number] DOB: Aug 08, 1965  Referred by: [name], NP Reason for referral : High Risk Managed Medicaid (Unsuccessful RNCM follow up telephone outreach)   An unsuccessful telephone outreach was attempted today. The patient was referred to the case management team for assistance with care management and care coordination.   Follow Up Plan: A HIPAA compliant phone message was left for the patient providing contact information and requesting a return call.   [name], BSN [place] RN Care Coordinator

## 2021-09-15 ENCOUNTER — Other Ambulatory Visit: Payer: Self-pay

## 2021-09-15 ENCOUNTER — Inpatient Hospital Stay (HOSPITAL_BASED_OUTPATIENT_CLINIC_OR_DEPARTMENT_OTHER): Payer: Medicaid Other | Admitting: Oncology

## 2021-09-15 ENCOUNTER — Inpatient Hospital Stay: Payer: Medicaid Other | Attending: Oncology

## 2021-09-15 VITALS — BP 86/75 | HR 111 | Temp 97.8°F | Resp 16 | Ht 60.0 in | Wt 133.3 lb

## 2021-09-15 DIAGNOSIS — R160 Hepatomegaly, not elsewhere classified: Secondary | ICD-10-CM | POA: Diagnosis not present

## 2021-09-15 DIAGNOSIS — N289 Disorder of kidney and ureter, unspecified: Secondary | ICD-10-CM | POA: Diagnosis not present

## 2021-09-15 DIAGNOSIS — Z8249 Family history of ischemic heart disease and other diseases of the circulatory system: Secondary | ICD-10-CM | POA: Insufficient documentation

## 2021-09-15 DIAGNOSIS — C19 Malignant neoplasm of rectosigmoid junction: Secondary | ICD-10-CM

## 2021-09-15 DIAGNOSIS — R5383 Other fatigue: Secondary | ICD-10-CM | POA: Insufficient documentation

## 2021-09-15 DIAGNOSIS — G629 Polyneuropathy, unspecified: Secondary | ICD-10-CM | POA: Diagnosis not present

## 2021-09-15 DIAGNOSIS — Z8049 Family history of malignant neoplasm of other genital organs: Secondary | ICD-10-CM | POA: Diagnosis not present

## 2021-09-15 DIAGNOSIS — C787 Secondary malignant neoplasm of liver and intrahepatic bile duct: Secondary | ICD-10-CM | POA: Insufficient documentation

## 2021-09-15 DIAGNOSIS — R252 Cramp and spasm: Secondary | ICD-10-CM | POA: Insufficient documentation

## 2021-09-15 DIAGNOSIS — K921 Melena: Secondary | ICD-10-CM | POA: Insufficient documentation

## 2021-09-15 DIAGNOSIS — Z800 Family history of malignant neoplasm of digestive organs: Secondary | ICD-10-CM | POA: Insufficient documentation

## 2021-09-15 DIAGNOSIS — Z79899 Other long term (current) drug therapy: Secondary | ICD-10-CM | POA: Insufficient documentation

## 2021-09-15 DIAGNOSIS — Z823 Family history of stroke: Secondary | ICD-10-CM | POA: Insufficient documentation

## 2021-09-15 DIAGNOSIS — R531 Weakness: Secondary | ICD-10-CM | POA: Insufficient documentation

## 2021-09-15 DIAGNOSIS — Z833 Family history of diabetes mellitus: Secondary | ICD-10-CM | POA: Insufficient documentation

## 2021-09-15 LAB — COMPREHENSIVE METABOLIC PANEL
ALT: 7 U/L (ref 0–44)
AST: 16 U/L (ref 15–41)
Albumin: 3 g/dL — ABNORMAL LOW (ref 3.5–5.0)
Alkaline Phosphatase: 68 U/L (ref 38–126)
Anion gap: 10 (ref 5–15)
BUN: 22 mg/dL — ABNORMAL HIGH (ref 6–20)
CO2: 26 mmol/L (ref 22–32)
Calcium: 8.8 mg/dL — ABNORMAL LOW (ref 8.9–10.3)
Chloride: 100 mmol/L (ref 98–111)
Creatinine, Ser: 1.24 mg/dL — ABNORMAL HIGH (ref 0.44–1.00)
GFR, Estimated: 52 mL/min — ABNORMAL LOW (ref >60)
Glucose, Bld: 131 mg/dL — ABNORMAL HIGH (ref 70–99)
Potassium: 4 mmol/L (ref 3.5–5.1)
Sodium: 136 mmol/L (ref 135–145)
Total Bilirubin: 0.3 mg/dL (ref 0.3–1.2)
Total Protein: 7.7 g/dL (ref 6.5–8.1)

## 2021-09-15 LAB — CBC WITH DIFFERENTIAL/PLATELET
Abs Immature Granulocytes: 0.07 10*3/uL (ref 0.00–0.07)
Basophils Absolute: 0 10*3/uL (ref 0.0–0.1)
Basophils Relative: 0 %
Eosinophils Absolute: 0.2 10*3/uL (ref 0.0–0.5)
Eosinophils Relative: 1 %
HCT: 37.8 % (ref 36.0–46.0)
Hemoglobin: 12 g/dL (ref 12.0–15.0)
Immature Granulocytes: 1 %
Lymphocytes Relative: 7 %
Lymphs Abs: 0.9 10*3/uL (ref 0.7–4.0)
MCH: 30.5 pg (ref 26.0–34.0)
MCHC: 31.7 g/dL (ref 30.0–36.0)
MCV: 96.2 fL (ref 80.0–100.0)
Monocytes Absolute: 0.8 10*3/uL (ref 0.1–1.0)
Monocytes Relative: 7 %
Neutro Abs: 10.4 10*3/uL — ABNORMAL HIGH (ref 1.7–7.7)
Neutrophils Relative %: 84 %
Platelets: 335 10*3/uL (ref 150–400)
RBC: 3.93 MIL/uL (ref 3.87–5.11)
RDW: 13.6 % (ref 11.5–15.5)
WBC: 12.4 10*3/uL — ABNORMAL HIGH (ref 4.0–10.5)
nRBC: 0 % (ref 0.0–0.2)

## 2021-09-15 MED ORDER — CYCLOBENZAPRINE HCL 10 MG PO TABS: 10.0000 mg | ORAL_TABLET | Freq: Three times a day (TID) | ORAL | 1 refills | Status: DC | PRN

## 2021-09-15 NOTE — Progress Notes (Signed)
Pt reports "being sick, waking up in night with sweats, nausea and intermittent painful leg twitches."

## 2021-09-16 LAB — CEA: CEA: 20 ng/mL — ABNORMAL HIGH (ref 0.0–4.7)

## 2021-09-22 ENCOUNTER — Other Ambulatory Visit: Payer: Self-pay | Admitting: *Deleted

## 2021-09-22 NOTE — Patient Instructions (Signed)
Visit Information  Ms. Osha Rane  - as a part of your Medicaid benefit, you are eligible for care management and care coordination services at no cost or copay. I was unable to reach you by phone today but would be happy to help you with your health related needs. Please feel free to call me [phone].   A member of the Managed Medicaid care management team will reach out to you again over the next 14 days.   [name], BSN [place] RN Care Coordinator

## 2021-09-22 NOTE — Patient Outreach (Signed)
Care Coordination  09/22/2021  Anita Sullivan 05-Oct-1965 [id number]   Medicaid Managed Care   Unsuccessful Outreach Note  09/22/2021 Name: Anita Sullivan MRN: [id number] DOB: 14-Nov-1965  Referred by: [name], NP Reason for referral : High Risk Managed Medicaid (Unsuccessful RNCM follow up telephone outreach, 2nd attempt)   A second unsuccessful telephone outreach was attempted today. The patient was referred to the case management team for assistance with care management and care coordination.   Follow Up Plan: A HIPAA compliant phone message was left for the patient providing contact information and requesting a return call.   [name], BSN [place] RN Care Coordinator

## 2021-09-24 ENCOUNTER — Ambulatory Visit
Admission: RE | Admit: 2021-09-24 | Discharge: 2021-09-24 | Disposition: A | Discharge status: Home / Self Care | Payer: Medicaid Other | Source: Ambulatory Visit | Attending: Radiology | Admitting: Radiology

## 2021-09-24 ENCOUNTER — Other Ambulatory Visit: Payer: Self-pay

## 2021-09-24 DIAGNOSIS — C787 Secondary malignant neoplasm of liver and intrahepatic bile duct: Secondary | ICD-10-CM | POA: Diagnosis not present

## 2021-09-24 DIAGNOSIS — C19 Malignant neoplasm of rectosigmoid junction: Secondary | ICD-10-CM

## 2021-09-24 DIAGNOSIS — Z9889 Other specified postprocedural states: Secondary | ICD-10-CM | POA: Diagnosis not present

## 2021-09-24 NOTE — Progress Notes (Signed)
Referring Physician(s): Delight Hoh, MD  Reason for follow up: Initial visit after Y-90 transarterial radioembolization  History of present illness: Initial H&P: Anita Sullivan is a 56 y.o. 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  The lesion in segment VI (initially 6.9 cm on 05/22/20 CT) was biopsied on 05/26/20 and confirmed colorectal metastasis.  Her similar appearing segment VII dome lesion has similar imaging characteristics.  There is an additional hypoattenuating subcentimeter lesion in the left lobe of uncertain etiology, though more conspicuous now than on index CT.     She has undergone 12 cycles of FOLFLOX, some cycles with Avastin however it was stopped due to GI bleeding.  Her last chemotherapy was in May 2022.  Interval CT performed on 04/14/21 for re-staging demonstrated slight interval growth of the metastases in segments VI and VII.  Anita Sullivan is now status post right lobe TARE on 09/01/21.  She presents today via virtual telephone clinic visit.   We planned to do split dose segmentectomies to the 2 lesions on prior CT, however during the mapping angiogram her right hepatic artery dissected.  This took over a month to recanalize, and during that time, she had significant progression on follow up CT, therefore we switch gears to a right lobar treatment initially.    She experienced some moderate post-embolic syndrome with nausea, vomiting, low grade fever, lack of appetite for a few days after the procedure.  She is now feeling much better and her appetite is returning.  She has mild abdominal pain and discomfort, similar to prior to the procedure.     Past Medical History:  Diagnosis Date   Cancer associated pain    Colon cancer ([place])    Family history of pancreatic cancer    Family history of uterine cancer     Past Surgical History:  Procedure Laterality Date   COLONOSCOPY WITH PROPOFOL N/A  05/27/2020   Procedure: COLONOSCOPY WITH PROPOFOL;  Surgeon: [name], MD;  Location: [facility] ENDOSCOPY;  Service: Gastroenterology;  Laterality: N/A;   FLEXIBLE SIGMOIDOSCOPY N/A 05/28/2020   Procedure: FLEXIBLE SIGMOIDOSCOPY;  Surgeon: [name], MD;  Location: [facility] ENDOSCOPY;  Service: Gastroenterology;  Laterality: N/A;   IR 3D INDEPENDENT WKST  06/09/2021   IR ANGIOGRAM SELECTIVE EACH ADDITIONAL VESSEL  06/09/2021   IR ANGIOGRAM SELECTIVE EACH ADDITIONAL VESSEL  06/09/2021   IR ANGIOGRAM SELECTIVE EACH ADDITIONAL VESSEL  06/09/2021   IR ANGIOGRAM SELECTIVE EACH ADDITIONAL VESSEL  08/18/2021   IR ANGIOGRAM SELECTIVE EACH ADDITIONAL VESSEL  08/18/2021   IR ANGIOGRAM SELECTIVE EACH ADDITIONAL VESSEL  08/18/2021   IR ANGIOGRAM VISCERAL SELECTIVE  06/09/2021   IR ANGIOGRAM VISCERAL SELECTIVE  08/18/2021   IR ANGIOGRAM VISCERAL SELECTIVE  09/01/2021   IR EMBO TUMOR ORGAN ISCHEMIA INFARCT INC GUIDE ROADMAPPING  09/01/2021   IR NEPHROSTOMY EXCHANGE LEFT  07/11/2020   IR NEPHROSTOMY EXCHANGE RIGHT  07/11/2020   IR NEPHROSTOMY PLACEMENT LEFT  05/23/2020   IR NEPHROSTOMY PLACEMENT RIGHT  05/23/2020   IR RADIOLOGIST EVAL & MGMT  04/28/2021   IR URETERAL STENT LEFT NEW ACCESS W/O SEP NEPHROSTOMY CATH  08/15/2020   IR URETERAL STENT RIGHT NEW ACCESS W/O SEP NEPHROSTOMY CATH  08/15/2020   IR US GUIDE VASC ACCESS RIGHT  06/09/2021   IR US GUIDE VASC ACCESS RIGHT  08/18/2021   IR US GUIDE VASC ACCESS RIGHT  09/01/2021   PORTACATH PLACEMENT Left 05/29/2020   Procedure: INSERTION PORT-A-CATH;  Surgeon: [name], MD;  Location: [facility] ORS;  Service: General;  Laterality: Left;    Allergies: Patient has no known allergies.  Medications: Prior to Admission medications   Medication Sig Start Date End Date Taking? Authorizing Provider  acetaminophen (TYLENOL) 325 MG tablet Take 650 mg by mouth every 6 (six) hours as needed.    [provider]  cyclobenzaprine (FLEXERIL) 10 MG tablet  Take 1 tablet (10 mg total) by mouth 3 (three) times daily as needed for muscle spasms. 09/15/21   [name], MD  HYDROcodone-acetaminophen (NORCO) 5-325 MG tablet Take 1 tablet by mouth every 6 (six) hours as needed for moderate pain. 08/20/21   [name], MD  lidocaine-prilocaine (EMLA) cream Apply 1 application topically as needed. Apply prior to appointment. Cover with plastic wrap. 11/05/20   [name], MD  Loperamide HCl (IMODIUM PO) Take by mouth.    [provider]  loratadine (CLARITIN) 10 MG tablet Take 10 mg by mouth daily.    [provider]  ondansetron (ZOFRAN) 4 MG tablet Take 1 tablet (4 mg total) by mouth every 8 (eight) hours as needed for nausea or vomiting. 11/05/20   [name], MD  pantoprazole (PROTONIX) 40 MG tablet Take 1 tablet (40 mg total) by mouth 2 (two) times daily. 11/05/20   [name], MD  prochlorperazine (COMPAZINE) 10 MG tablet Take 1 tablet (10 mg total) by mouth every 6 (six) hours as needed (Nausea or vomiting). 06/04/20 06/12/20  [name], MD     Family History  Problem Relation Age of Onset   Other Mother        Corticobasal degeneration   Uterine cancer Mother    Diabetes Maternal Grandmother    Stroke Maternal Grandfather    Stroke Paternal Grandfather    Heart disease Father    Pancreatic cancer Maternal Uncle 48   Cancer Paternal Aunt        unk type   Liver cancer Maternal Uncle    Pancreatic cancer Cousin    Cancer Cousin        unk type    Social History   Socioeconomic History   Marital status: Divorced    Spouse name: Not on file   Number of children: 1   Years of education: Not on file   Highest education level: Not on file  Occupational History   Not on file  Tobacco Use   Smoking status: Former    Types: Cigarettes   Smokeless tobacco: Never  Vaping Use   Vaping Use: Never used  Substance and Sexual Activity   Alcohol use: Never   Drug use: Never    Sexual activity: Not Currently  Other Topics Concern   Not on file  Social History Narrative   Lives with dad, 1 daughter and 2 dogs   Social Determinants of Health   Financial Resource Strain: Not on file  Food Insecurity: Food Insecurity Present   Worried About Charity fundraiser in the Last Year: Sometimes true   [place] in the Last Year: Sometimes true  Transportation Needs: No Transportation Needs   Lack of Transportation (Medical): No   Lack of Transportation (Non-Medical): No  Physical Activity: Not on file  Stress: Not on file  Social Connections: Socially Isolated   Frequency of Communication with Friends and Family: Once a week   Frequency of Social Gatherings with Friends and Family: More than three times a week  Attends Religious Services: Never   Active Member of Clubs or Organizations: No   Attends Archivist Meetings: Never   Marital Status: Divorced     Vital Signs: There were no vitals taken for this visit.  No physical examination was performed in lieu of virtual telephone clinic visit.   Imaging: CT AP 07/31/21  2.7 cm segment III metastatic lesion, enlarged from 0.9 cm in September 2022  Hepatic angiogram 08/18/21  2.6 cm segment III met   Labs:  CBC: Recent Labs    07/14/21 0958 08/18/21 0817 09/01/21 0842 09/15/21 1021  WBC 8.0 8.2 7.9 12.4*  HGB 11.5* 11.8* 11.0* 12.0  HCT 34.9* 37.7 34.3* 37.8  PLT 327 326 322 335    COAGS: Recent Labs    06/09/21 0846 08/18/21 0817 09/01/21 0842  INR 1.0 1.0 1.0    BMP: Recent Labs    07/14/21 0958 08/18/21 0817 09/01/21 0842 09/15/21 1021  NA 137 137 137 136  K 3.7 4.0 3.6 4.0  CL 101 103 104 100  CO2 _0 GLUCOSE 95 100* 100* 131*  BUN 16 21* 19 22*  CALCIUM 8.7* 8.6* 8.7* 8.8*  CREATININE 1.17* 1.29* 1.14* 1.24*  GFRNONAA 56* 50* 58* 52*    LIVER FUNCTION TESTS: Recent Labs    07/14/21 0958 08/18/21 0817 09/01/21 0842 09/15/21 1021   BILITOT 0.5 0.5 0.6 0.3  AST _1 ALT _2 ALKPHOS 77 91 82 68  PROT 7.7 7.7 7.8 7.7  ALBUMIN 3.4* 3.3* 3.4* 3.0*    Assessment and Plan: 56 year old female with history of colorectal adenocarcinoma metastatic to the liver, now status post right lobar transarterial radioembolization on 09/01/21.  She is recovering well.    We discussed strategies to treat the enlarging left lobe metastatic lesion.  Ultimately, she is interested in additional Y-90 treatment, offered as segmentectomy approach.  Plan for left lobe segment III radiation segmentectomy at [facility].   Electronically Signed: Suzette Battiest 09/24/2021, 1:38 PM   I spent a total of 25 Minutes in telephone clinical consultation, greater than 50% of which was counseling/coordinating care for metastatic colon cancer.

## 2021-09-28 DIAGNOSIS — C19 Malignant neoplasm of rectosigmoid junction: Secondary | ICD-10-CM | POA: Diagnosis not present

## 2021-09-29 ENCOUNTER — Other Ambulatory Visit (HOSPITAL_COMMUNITY): Payer: Self-pay | Admitting: Interventional Radiology

## 2021-09-29 DIAGNOSIS — C19 Malignant neoplasm of rectosigmoid junction: Secondary | ICD-10-CM

## 2021-10-05 ENCOUNTER — Other Ambulatory Visit: Payer: Self-pay | Admitting: *Deleted

## 2021-10-05 MED ORDER — HYDROCODONE-ACETAMINOPHEN 5-325 MG PO TABS: 1.0000 | ORAL_TABLET | Freq: Four times a day (QID) | ORAL | 0 refills | Status: DC | PRN

## 2021-10-09 ENCOUNTER — Telehealth: Payer: Self-pay | Admitting: Nurse Practitioner

## 2021-10-09 NOTE — Telephone Encounter (Signed)
.. ?  Medicaid Managed Care  ? ?Unsuccessful Outreach Note ? ?10/09/2021 ?Name: Anita Sullivan MRN: [id number] DOB: 04-06-1966 ? ?Referred by: [name], NP ?Reason for referral : High Risk Managed Medicaid (I called the patient today to get her phone visit rescheduled with the MM RNCM. I left my name and number on her VM.) ? ? ?An unsuccessful telephone outreach was attempted today. The patient was referred to the case management team for assistance with care management and care coordination.  ? ?Follow Up Plan: The care management team will reach out to the patient again over the next 7 days.  ?Reita Chard ?Care Guide, High Risk Medicaid Managed Care ?Embedded Care Coordination ?[place]  ? ? ? ?

## 2021-10-12 DIAGNOSIS — C19 Malignant neoplasm of rectosigmoid junction: Secondary | ICD-10-CM | POA: Diagnosis not present

## 2021-10-14 ENCOUNTER — Other Ambulatory Visit: Payer: Self-pay

## 2021-10-14 ENCOUNTER — Other Ambulatory Visit: Payer: Medicaid Other | Admitting: *Deleted

## 2021-10-14 NOTE — Patient Outreach (Signed)
Care Coordination ? ?10/14/2021 ? ?[name] ?29-Jun-1966 ?[id number] ? ? ?Medicaid Managed Care  ? ?Unsuccessful Outreach Note ? ?10/14/2021 ?Name: Zaryiah Barz MRN: [id number] DOB: 1966-06-24 ? ?Referred by: [name], NP ?Reason for referral : Case Closure (RNCM performing case closure for 3 unsuccessful outreach attempts) ? ? ?Three unsuccessful telephone outreach attempts have been made. The patient was referred to the case management team for assistance with care management and care coordination. The patient's primary care provider has been notified of our unsuccessful attempts to make or maintain contact with the patient. The care management team is pleased to engage with this patient at any time in the future should he/she be interested in assistance from the care management team.  ? ?Follow Up Plan: We have been unable to make contact with the patient for follow up. The care management team is available to follow up with the patient after provider conversation with the patient regarding recommendation for care management engagement and subsequent re-referral to the care management team.  ? ?[name], BSN ?[place] ?RN Care Coordinator ? ? ?

## 2021-10-19 ENCOUNTER — Other Ambulatory Visit: Payer: Self-pay | Admitting: Radiology

## 2021-10-20 ENCOUNTER — Ambulatory Visit (HOSPITAL_COMMUNITY)
Admission: RE | Admit: 2021-10-20 | Discharge: 2021-10-20 | Disposition: A | Discharge status: Home / Self Care | Payer: Medicaid Other | Source: Ambulatory Visit | Attending: Interventional Radiology | Admitting: Interventional Radiology

## 2021-10-20 ENCOUNTER — Other Ambulatory Visit (HOSPITAL_COMMUNITY): Payer: Self-pay | Admitting: Interventional Radiology

## 2021-10-20 ENCOUNTER — Encounter (HOSPITAL_COMMUNITY): Payer: Self-pay

## 2021-10-20 ENCOUNTER — Encounter (HOSPITAL_COMMUNITY)
Admission: RE | Admit: 2021-10-20 | Discharge: 2021-10-20 | Disposition: A | Discharge status: Home / Self Care | Payer: Medicaid Other | Source: Ambulatory Visit | Attending: Interventional Radiology | Admitting: Interventional Radiology

## 2021-10-20 ENCOUNTER — Other Ambulatory Visit: Payer: Self-pay

## 2021-10-20 DIAGNOSIS — C220 Liver cell carcinoma: Secondary | ICD-10-CM | POA: Diagnosis not present

## 2021-10-20 DIAGNOSIS — C19 Malignant neoplasm of rectosigmoid junction: Secondary | ICD-10-CM

## 2021-10-20 DIAGNOSIS — C787 Secondary malignant neoplasm of liver and intrahepatic bile duct: Secondary | ICD-10-CM | POA: Insufficient documentation

## 2021-10-20 HISTORY — PX: IR EMBO TUMOR ORGAN ISCHEMIA INFARCT INC GUIDE ROADMAPPING: IMG5449

## 2021-10-20 HISTORY — PX: IR ANGIOGRAM SELECTIVE EACH ADDITIONAL VESSEL: IMG667

## 2021-10-20 HISTORY — PX: IR ANGIOGRAM VISCERAL SELECTIVE: IMG657

## 2021-10-20 HISTORY — PX: IR US GUIDE VASC ACCESS RIGHT: IMG2390

## 2021-10-20 LAB — COMPREHENSIVE METABOLIC PANEL
ALT: 15 U/L (ref 0–44)
AST: 26 U/L (ref 15–41)
Albumin: 3 g/dL — ABNORMAL LOW (ref 3.5–5.0)
Alkaline Phosphatase: 84 U/L (ref 38–126)
Anion gap: 10 (ref 5–15)
BUN: 23 mg/dL — ABNORMAL HIGH (ref 6–20)
CO2: 23 mmol/L (ref 22–32)
Calcium: 8.3 mg/dL — ABNORMAL LOW (ref 8.9–10.3)
Chloride: 99 mmol/L (ref 98–111)
Creatinine, Ser: 1.43 mg/dL — ABNORMAL HIGH (ref 0.44–1.00)
GFR, Estimated: 44 mL/min — ABNORMAL LOW (ref >60)
Glucose, Bld: 109 mg/dL — ABNORMAL HIGH (ref 70–99)
Potassium: 3.6 mmol/L (ref 3.5–5.1)
Sodium: 132 mmol/L — ABNORMAL LOW (ref 135–145)
Total Bilirubin: 0.6 mg/dL (ref 0.3–1.2)
Total Protein: 7.8 g/dL (ref 6.5–8.1)

## 2021-10-20 LAB — CBC WITH DIFFERENTIAL/PLATELET
Abs Immature Granulocytes: 0.05 10*3/uL (ref 0.00–0.07)
Basophils Absolute: 0 10*3/uL (ref 0.0–0.1)
Basophils Relative: 0 %
Eosinophils Absolute: 0.1 10*3/uL (ref 0.0–0.5)
Eosinophils Relative: 1 %
HCT: 34 % — ABNORMAL LOW (ref 36.0–46.0)
Hemoglobin: 10.9 g/dL — ABNORMAL LOW (ref 12.0–15.0)
Immature Granulocytes: 1 %
Lymphocytes Relative: 13 %
Lymphs Abs: 1.2 10*3/uL (ref 0.7–4.0)
MCH: 30.6 pg (ref 26.0–34.0)
MCHC: 32.1 g/dL (ref 30.0–36.0)
MCV: 95.5 fL (ref 80.0–100.0)
Monocytes Absolute: 0.9 10*3/uL (ref 0.1–1.0)
Monocytes Relative: 10 %
Neutro Abs: 7.3 10*3/uL (ref 1.7–7.7)
Neutrophils Relative %: 75 %
Platelets: 213 10*3/uL (ref 150–400)
RBC: 3.56 MIL/uL — ABNORMAL LOW (ref 3.87–5.11)
RDW: 14.4 % (ref 11.5–15.5)
WBC: 9.6 10*3/uL (ref 4.0–10.5)
nRBC: 0 % (ref 0.0–0.2)

## 2021-10-20 LAB — PROTIME-INR
INR: 1.1 (ref 0.8–1.2)
Prothrombin Time: 14.2 seconds (ref 11.4–15.2)

## 2021-10-20 MED ORDER — FENTANYL CITRATE (PF) 100 MCG/2ML IJ SOLN
INTRAMUSCULAR | Status: AC
  Filled 2021-10-20: qty 2

## 2021-10-20 MED ORDER — LIDOCAINE HCL 1 % IJ SOLN
INTRAMUSCULAR | Status: AC
  Filled 2021-10-20: qty 20

## 2021-10-20 MED ORDER — HEPARIN SOD (PORK) LOCK FLUSH 100 UNIT/ML IV SOLN
500.0000 [IU] | INTRAVENOUS | Status: AC | PRN
  Administered 2021-10-20: 500 [IU]
  Filled 2021-10-20: qty 5

## 2021-10-20 MED ORDER — IOHEXOL 300 MG/ML  SOLN
100.0000 mL | Freq: Once | INTRAMUSCULAR | Status: AC | PRN
  Administered 2021-10-20: 60 mL via INTRA_ARTERIAL

## 2021-10-20 MED ORDER — SODIUM CHLORIDE 0.9 % IV SOLN
8.0000 mg | Freq: Once | INTRAVENOUS | Status: AC
  Administered 2021-10-20: 8 mg via INTRAVENOUS
  Filled 2021-10-20: qty 4

## 2021-10-20 MED ORDER — DEXAMETHASONE SODIUM PHOSPHATE 10 MG/ML IJ SOLN
8.0000 mg | Freq: Once | INTRAMUSCULAR | Status: AC
  Administered 2021-10-20: 8 mg via INTRAVENOUS
  Filled 2021-10-20: qty 1

## 2021-10-20 MED ORDER — PANTOPRAZOLE SODIUM 40 MG IV SOLR
40.0000 mg | Freq: Once | INTRAVENOUS | Status: AC
  Administered 2021-10-20: 40 mg via INTRAVENOUS
  Filled 2021-10-20: qty 10

## 2021-10-20 MED ORDER — YTTRIUM 90 INJECTION
12.4500 | INJECTION | Freq: Once | INTRAVENOUS | Status: DC | PRN
Start: 2021-10-20 — End: 2021-10-26

## 2021-10-20 MED ORDER — IOHEXOL 300 MG/ML  SOLN
100.0000 mL | Freq: Once | INTRAMUSCULAR | Status: AC | PRN
  Administered 2021-10-20: 50 mL via INTRA_ARTERIAL

## 2021-10-20 MED ORDER — SODIUM CHLORIDE 0.9 % IV SOLN
2.0000 g | Freq: Once | INTRAVENOUS | Status: AC
  Administered 2021-10-20: 2 g via INTRAVENOUS
  Filled 2021-10-20: qty 2

## 2021-10-20 MED ORDER — LIDOCAINE-EPINEPHRINE 1 %-1:100000 IJ SOLN
INTRAMUSCULAR | Status: AC | PRN
  Administered 2021-10-20: 10 mL

## 2021-10-20 MED ORDER — SODIUM CHLORIDE 0.9 % IV SOLN: INTRAVENOUS | Status: DC

## 2021-10-20 MED ORDER — MIDAZOLAM HCL 2 MG/2ML IJ SOLN
INTRAMUSCULAR | Status: AC
  Filled 2021-10-20: qty 4

## 2021-10-20 NOTE — H&P (Signed)
? ? ?Referring Physician(s): ?[name],T ? ?Supervising Physician: [name] ? ?Patient Status:  [facility] ? ?Chief Complaint: ?Metastatic colon cancer to liver ? ? ?Subjective: ?Patient familiar to IR service from bilateral nephrostomies in 2021, liver mass biopsy in 2021, left chest tube placement in 2021, bilateral ureteral stents on 08/15/2020, consultation with Dr. [name] on 04/28/2021 to discuss treatment options for metastatic colon cancer to the liver as well as pre Y 90 hepatic/visceral arteriogram on 06/09/2021.  At that time angiography demonstrated hypervascular masses in the right hepatic lobe consistent with metastatic disease.  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 She underwent successful arterial roadmapping study on 08/18/2021 and right hepatic lobe Y 90 radioembolization on 09/01/21. She presents today for left hepatic Y-90 radioembolization.  She currently denies fever, headache, chest pain, dyspnea, cough, vomiting.  She does have some lower abdominal discomfort, occasional back pain, recent nausea, and intermittent rectal bleeding. ? ?Past Medical History:  ?Diagnosis Date  ? Cancer associated pain   ? Colon cancer ([place])   ? Family history of pancreatic cancer   ? Family history of uterine cancer   ? ?Past Surgical History:  ?Procedure Laterality Date  ? COLONOSCOPY WITH PROPOFOL N/A 05/27/2020  ? Procedure: COLONOSCOPY WITH PROPOFOL;  Surgeon: [name], MD;  Location: [facility] ENDOSCOPY;  Service: Gastroenterology;  Laterality: N/A;  ? FLEXIBLE SIGMOIDOSCOPY N/A 05/28/2020  ? Procedure: FLEXIBLE SIGMOIDOSCOPY;  Surgeon: [name], MD;  Location: [facility] ENDOSCOPY;  Service: Gastroenterology;  Laterality: N/A;  ? IR 3D INDEPENDENT [facility]KST  06/09/2021  ? IR ANGIOGRAM SELECTIVE EACH ADDITIONAL VESSEL  06/09/2021  ? IR ANGIOGRAM SELECTIVE EACH ADDITIONAL VESSEL  06/09/2021  ? IR ANGIOGRAM  SELECTIVE EACH ADDITIONAL VESSEL  06/09/2021  ? IR ANGIOGRAM SELECTIVE EACH ADDITIONAL VESSEL  08/18/2021  ? IR ANGIOGRAM SELECTIVE EACH ADDITIONAL VESSEL  08/18/2021  ? IR ANGIOGRAM SELECTIVE EACH ADDITIONAL VESSEL  08/18/2021  ? IR ANGIOGRAM VISCERAL SELECTIVE  06/09/2021  ? IR ANGIOGRAM VISCERAL SELECTIVE  08/18/2021  ? IR ANGIOGRAM VISCERAL SELECTIVE  09/01/2021  ? IR EMBO TUMOR ORGAN ISCHEMIA INFARCT INC GUIDE ROADMAPPING  09/01/2021  ? IR NEPHROSTOMY EXCHANGE LEFT  07/11/2020  ? IR NEPHROSTOMY EXCHANGE RIGHT  07/11/2020  ? IR NEPHROSTOMY PLACEMENT LEFT  05/23/2020  ? IR NEPHROSTOMY PLACEMENT RIGHT  05/23/2020  ? IR RADIOLOGIST EVAL & MGMT  04/28/2021  ? IR URETERAL STENT LEFT NEW ACCESS W/O SEP NEPHROSTOMY CATH  08/15/2020  ? IR URETERAL STENT RIGHT NEW ACCESS W/O SEP NEPHROSTOMY CATH  08/15/2020  ? IR US GUIDE VASC ACCESS RIGHT  06/09/2021  ? IR US GUIDE VASC ACCESS RIGHT  08/18/2021  ? IR US GUIDE VASC ACCESS RIGHT  09/01/2021  ? PORTACATH PLACEMENT Left 05/29/2020  ? Procedure: INSERTION PORT-A-CATH;  Surgeon: [name], MD;  Location: [facility] ORS;  Service: General;  Laterality: Left;  ? ? ? ? ?Allergies: ?Patient has no known allergies. ? ?Medications: ?Prior to Admission medications   ?Medication Sig Start Date End Date Taking? Authorizing Provider  ?acetaminophen (TYLENOL) 325 MG tablet Take 650 mg by mouth every 6 (six) hours as needed.   Yes [provider]  ?cyclobenzaprine (FLEXERIL) 10 MG tablet Take 1 tablet (10 mg total) by mouth 3 (three) times daily as needed for muscle spasms. 09/15/21  Yes [name], MD  ?HYDROcodone-acetaminophen (NORCO) 5-325 MG tablet Take 1 tablet by mouth every 6 (six) hours as needed  for moderate pain. 10/05/21  Yes [name], MD  ?lidocaine-prilocaine (EMLA) cream Apply 1 application topically as needed. Apply prior to appointment. Cover with plastic wrap. 11/05/20  Yes [name], MD  ?ondansetron (ZOFRAN) 4 MG tablet Take 1 tablet (4 mg total) by  mouth every 8 (eight) hours as needed for nausea or vomiting. 11/05/20  Yes [name], MD  ?Loperamide HCl (IMODIUM PO) Take by mouth.    [provider]  ?loratadine (CLARITIN) 10 MG tablet Take 10 mg by mouth daily.    [provider]  ?pantoprazole (PROTONIX) 40 MG tablet Take 1 tablet (40 mg total) by mouth 2 (two) times daily. 11/05/20   [name], MD  ?prochlorperazine (COMPAZINE) 10 MG tablet Take 1 tablet (10 mg total) by mouth every 6 (six) hours as needed (Nausea or vomiting). 06/04/20 06/12/20  [name], MD  ? ? ? ?Vital Signs: ?BP 95/75   Temp (!) 97.4 ?F (36.3 ?C) (Oral)   Resp 16   Ht 5' (1.524 m)   Wt 123 lb (55.8 kg)   SpO2 100%   BMI 24.02 kg/m?  ? ?Physical Exam awake, alert.  Chest with some slightly diminished breath sounds right base, left clear.  Clean, intact left chest wall Port-A-Cath.  Heart with regular rate and rhythm.  Abdomen soft, positive bowel sounds, some mild lower anterior abdominal tenderness to palpation.  Extremities with full range of motion ? ?Imaging: ?No results found. ? ?Labs: ? ?CBC: ?Recent Labs  ?  08/18/21 ?0817 09/01/21 ?4332 09/15/21 ?1021 10/20/21 ?0751  ?WBC 8.2 7.9 12.4* 9.6  ?HGB 11.8* 11.0* 12.0 10.9*  ?HCT 37.7 34.3* 37.8 34.0*  ?PLT 326 322 335 213  ? ? ?COAGS: ?Recent Labs  ?  06/09/21 ?9518 08/18/21 ?8416 09/01/21 ?6063 10/20/21 ?0751  ?INR 1.0 1.0 1.0 1.1  ? ? ?BMP: ?Recent Labs  ?  08/18/21 ?0817 09/01/21 ?0160 09/15/21 ?1021 10/20/21 ?0751  ?NA 137 137 136 132*  ?K 4.0 3.6 4.0 3.6  ?CL 103 104 100 99  ?CO2 '27 24 26 23  '$ ?GLUCOSE 100* 100* 131* 109*  ?BUN 21* 19 22* 23*  ?CALCIUM 8.6* 8.7* 8.8* 8.3*  ?CREATININE 1.29* 1.14* 1.24* 1.43*  ?GFRNONAA 50* 58* 52* 44*  ? ? ?LIVER FUNCTION TESTS: ?Recent Labs  ?  08/18/21 ?0817 09/01/21 ?1093 09/15/21 ?1021 10/20/21 ?0751  ?BILITOT 0.5 0.6 0.3 0.6  ?AST '16 19 16 26  '$ ?ALT '11 9 7 15  '$ ?ALKPHOS 91 82 68 84  ?PROT 7.7 7.8 7.7 7.8  ?ALBUMIN 3.3* 3.4* 3.0* 3.0*   ? ? ?Assessment and Plan: ?Patient familiar to IR service from bilateral nephrostomies in 2021, liver mass biopsy in 2021, left chest tube placement in 2021, bilateral ureteral stents on 08/15/2020, consultation with Dr. [name] on 04/28/2021 to discuss treatment options for metastatic colon cancer to the liver as well as pre Y 90 hepatic/visceral arteriogram on 06/09/2021.  At that time angiography demonstrated hypervascular masses in the right hepatic lobe consistent with metastatic disease.  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 She underwent successful arterial roadmapping study on 08/18/2021 and right hepatic lobe Y 90 radioembolization on 09/01/21. She presents today for left hepatic Y-90 radioembolization.Risks and benefits of procedure were discussed with the patient including, but not limited to bleeding, infection, vascular injury or contrast induced renal failure. ? ?This interventional procedure involves the use of X-rays  and because of the nature of the planned procedure, it is possible that we will have prolonged use of X-ray fluoroscopy. ? ?Potential radiation risks to you include (but are not limited to) the following: ?- A slightly elevated risk for cancer  several years later in life. This risk is typically less ?than 0.5% percent. This risk is low in comparison to the normal incidence of human ?cancer, which is 33% for women and 50% for men according to the American Cancer ?Society. ?- Radiation induced injury can include skin redness, resembling a rash, tissue breakdown / ulcers and hair loss (which can be temporary or permanent).  ? ?The likelihood of either of these occurring depends on the difficulty of the procedure and whether you are sensitive to radiation due to previous procedures, disease, or genetic conditions.  ? ?IF your procedure requires a prolonged use of radiation, you will be notified  and given written instructions for further action.  It is your responsibility to monitor the irradiated area for the 2 weeks following the procedure and to notify your physician if you are concerned that you have suffered

## 2021-10-20 NOTE — Procedures (Signed)
Interventional Radiology Procedure Note ? ?Procedure:  ?1) Left hepatic angiogram ?2) Left hepatic transarterial radioembolization  ? ?Findings: Please refer to procedural dictation for full description. Segment 3 Y-90 segmentectomy.  6 Fr Angioseal closure of right CFA. ? ?Complications: None immediate ? ?Estimated Blood Loss: < 5 mL ? ?Recommendations: ?Strict 4 hour bedrest (flat until 13:15 then head of bed up to 30 degrees until 15:15). ?IR will arrange 2-3 week follow up in IR clinic. ? ? ?[name], MD ?Pager: [phone] ? ? ? ?

## 2021-10-20 NOTE — Sedation Documentation (Signed)
Transfering pt to nuc med for study ?

## 2021-10-20 NOTE — Discharge Instructions (Signed)
Please call Interventional Radiology clinic [phone] with any questions or concerns. ? ?You may remove your dressing and shower tomorrow. ? ? ? ?Hepatic Artery Radioembolization, Care After ?The following information offers guidance on how to care for yourself after your procedure. Your health care provider may also give you more specific instructions. If you have problems or questions, contact your health care provider. ?What can I expect after the procedure? ?After the procedure, it is possible to have: ?A slight fever for 7 to 10 days. This may be accompanied by pain, nausea, or vomiting, which is referred to as post-embolization syndrome. You may be given medicine to help relieve these symptoms. If your fever gets worse, tell your health care provider. ?Tiredness (fatigue). ?Loss of appetite. This should gradually improve after about 1 week. ?Abdominal pain on your right side. ?Soreness and tenderness in your groin area where the needle and catheter were placed (puncture site). ?Follow these instructions at home: ?Puncture site care ?Follow instructions from your health care provider about how to take care of the puncture site. Make sure you: ?Wash your hands with soap and water for at least 20 seconds before and after you change your bandage (dressing). If soap and water are not available, use hand sanitizer. ?Change your dressing as told by your health care provider. ?Check your puncture site every day for signs of infection. Check for: ?More redness, swelling, or pain. ?Fluid or blood. ?Warmth. ?Pus or a bad smell. ?Activity ?Rest as told by your health care provider. ?Return to your normal activities as told by your health care provider. Ask your health care provider what activities are safe for you. ?Avoid sitting for a long time without moving. Get up to take short walks every 1-2 hours. This is important to improve blood flow and breathing. Ask for help if you feel weak or unsteady. ?If you were given  a sedative during the procedure, it can affect you for several hours. Do not drive or operate machinery until your health care provider says that it is safe. ?Do not lift anything that is heavier than 10 lb (4.5 kg), or the limit that you are told, until your health care provider says that it is safe. ?Medicines ?Take over-the-counter and prescription medicines only as told by your health care provider. ?Ask your health care provider if the medicine prescribed to you: ?Requires you to avoid driving or using machinery. ?Can cause constipation. You may need to take these actions to prevent or treat constipation: ?Drink enough fluid to keep your urine pale yellow. ?Take over-the-counter or prescription medicines. ?Eat foods that are high in fiber, such as beans, whole grains, and fresh fruits and vegetables. ?Limit foods that are high in fat and processed sugars, such as fried or sweet foods. ?General instructions ?Eat frequent, small meals until your appetite returns. Follow instructions from your health care provider about eating or drinking restrictions. ?Do not take baths, swim, or use a hot tub until your health care provider approves. You may take showers. Wash your puncture site with mild soap and water, and pat the area dry. ?Wear compression stockings as told by your health care provider. These stockings help to prevent blood clots and reduce swelling in your legs. ?Keep all follow-up visits. This is important. You may need to have blood tests and imaging tests. ?Contact a health care provider if: ?You have any of these signs of infection: ?More redness, swelling, or pain around your puncture site. ?Fluid or blood coming from your   puncture site. ?Warmth coming from your puncture site. ?Pus or a bad smell coming from your puncture site. ?You have pain that: ?Gets worse. ?Does not get better with medicine. ?Feels like very bad heartburn. ?Is in the middle of your abdomen, above your belly button. ?You have any  signs of infection or liver failure, such as: ?Your skin or the white parts of your eyes turn yellow (jaundice). ?The color of your urine changes to dark brown. ?The color of your stool (feces) changes to light yellow. ?Your abdominal measurement (girth) increases in a short period of time. ?You gain more than 5 lb (2.3 kg) in a short period of time. ?Get help right away if: ?You have a fever that lasts more than 10 days or is higher than what your health care provider told you to expect. ?You develop any of the following in your legs: ?Pain. ?Swelling. ?Skin that is cold or pale or turns blue. ?You have chest pain. ?You have blood in your vomit, saliva, or stool. ?You have trouble breathing. ?These symptoms may represent a serious problem that is an emergency. Do not wait to see if the symptoms will go away. Get medical help right away. Call your local emergency services (911 in the [place] Do not drive yourself to the hospital. ?Summary ?After the procedure, it is possible to have a slight fever for up to 7-10 days, tiredness, loss of appetite, abdominal pain on the right side, and groin tenderness where the catheter was placed. ?Do not come in close contact with people for up to a week after your procedure, as told by your health care provider. ?Follow instructions from your health care provider about how to take care of the puncture site. ?Contact a health care provider if you have any signs of infection. ?Get help right away if you develop pain or swelling in your legs or if your legs feel cool or look pale. ?This information is not intended to replace advice given to you by your health care provider. Make sure you discuss any questions you have with your health care provider. ?Document Revised: 06/22/2020 Document Reviewed: 06/22/2020 ?Elsevier Patient Education ? 2022 [employer] ?  ? ? ? ?Moderate Conscious Sedation, Adult, Care After ?This sheet gives you information about how to care for yourself after  your procedure. Your health care provider may also give you more specific instructions. If you have problems or questions, contact your health care provider. ?What can I expect after the procedure? ?After the procedure, it is common to have: ?Sleepiness for several hours. ?Impaired judgment for several hours. ?Difficulty with balance. ?Vomiting if you eat too soon. ?Follow these instructions at home: ?For the time period you were told by your health care provider: ?Rest. ?Do not participate in activities where you could fall or become injured. ?Do not drive or use machinery. ?Do not drink alcohol. ?Do not take sleeping pills or medicines that cause drowsiness. ?Do not make important decisions or sign legal documents. ?Do not take care of children on your own.  ?  ?  ?Eating and drinking ?Follow the diet recommended by your health care provider. ?Drink enough fluid to keep your urine pale yellow. ?If you vomit: ?Drink water, juice, or soup when you can drink without vomiting. ?Make sure you have little or no nausea before eating solid foods.   ?General instructions ?Take over-the-counter and prescription medicines only as told by your health care provider. ?Have a responsible adult stay with you for the time   you are told. It is important to have someone help care for you until you are awake and alert. ?Do not smoke. ?Keep all follow-up visits as told by your health care provider. This is important. ?Contact a health care provider if: ?You are still sleepy or having trouble with balance after 24 hours. ?You feel light-headed. ?You keep feeling nauseous or you keep vomiting. ?You develop a rash. ?You have a fever. ?You have redness or swelling around the IV site. ?Get help right away if: ?You have trouble breathing. ?You have new-onset confusion at home. ?Summary ?After the procedure, it is common to feel sleepy, have impaired judgment, or feel nauseous if you eat too soon. ?Rest after you get home. Know the things you  should not do after the procedure. ?Follow the diet recommended by your health care provider and drink enough fluid to keep your urine pale yellow. ?Get help right away if you have trouble breathing or

## 2021-11-06 ENCOUNTER — Other Ambulatory Visit: Payer: Self-pay | Admitting: *Deleted

## 2021-11-06 MED ORDER — HYDROCODONE-ACETAMINOPHEN 5-325 MG PO TABS: 1.0000 | ORAL_TABLET | Freq: Four times a day (QID) | ORAL | 0 refills | Status: DC | PRN

## 2021-11-06 MED ORDER — CYCLOBENZAPRINE HCL 10 MG PO TABS: 10.0000 mg | ORAL_TABLET | Freq: Three times a day (TID) | ORAL | 1 refills | Status: DC | PRN

## 2021-11-11 DIAGNOSIS — C19 Malignant neoplasm of rectosigmoid junction: Secondary | ICD-10-CM | POA: Diagnosis not present

## 2021-11-12 ENCOUNTER — Ambulatory Visit
Admission: RE | Admit: 2021-11-12 | Discharge: 2021-11-12 | Disposition: A | Discharge status: Home / Self Care | Payer: Medicaid Other | Source: Ambulatory Visit | Attending: Radiology | Admitting: Radiology

## 2021-11-12 DIAGNOSIS — C19 Malignant neoplasm of rectosigmoid junction: Secondary | ICD-10-CM | POA: Diagnosis not present

## 2021-11-12 DIAGNOSIS — C787 Secondary malignant neoplasm of liver and intrahepatic bile duct: Secondary | ICD-10-CM | POA: Diagnosis not present

## 2021-11-12 DIAGNOSIS — Z9889 Other specified postprocedural states: Secondary | ICD-10-CM | POA: Diagnosis not present

## 2021-11-12 NOTE — Progress Notes (Signed)
? ?Referring Physician(s): ?Delight Hoh, MD ?  ?Reason for follow up: ?Initial visit after Y-90 transarterial radioembolization (left lobe) ?  ?History of present illness: ?Initial H&P: ?Anita Sullivan is a 56 y.o. 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  The lesion in segment VI (initially 6.9 cm on 05/22/20 CT) was biopsied on 05/26/20 and confirmed colorectal metastasis.  Her similar appearing segment VII dome lesion has similar imaging characteristics.  There is an additional hypoattenuating subcentimeter lesion in the left lobe of uncertain etiology, though more conspicuous now than on index CT.   ?  ?She has undergone 12 cycles of FOLFLOX, some cycles with Avastin however it was stopped due to GI bleeding.  Her last chemotherapy was in May 2022.  Interval CT performed on 04/14/21 for re-staging demonstrated slight interval growth of the metastases in segments VI and VII. ?  ?Anita Sullivan is now status post right lobe TARE on 09/01/21 and left lobe segmentectomy TARE on 10/20/21.  She presents today via virtual telephone clinic visit.    ? ?She endorses some diarrhea after the procedure, but no vomiting.  This lasted about 2.5 days.  Feeling much better overall, but still lacking energy.  Her pain well controlled.   ? ?Past Medical History:  ?Diagnosis Date  ? Cancer associated pain   ? Colon cancer ([place])   ? Family history of pancreatic cancer   ? Family history of uterine cancer   ? ? ?Past Surgical History:  ?Procedure Laterality Date  ? COLONOSCOPY WITH PROPOFOL N/A 05/27/2020  ? Procedure: COLONOSCOPY WITH PROPOFOL;  Surgeon: [name], MD;  Location: [facility] ENDOSCOPY;  Service: Gastroenterology;  Laterality: N/A;  ? FLEXIBLE SIGMOIDOSCOPY N/A 05/28/2020  ? Procedure: FLEXIBLE SIGMOIDOSCOPY;  Surgeon: [name], MD;  Location: [facility] ENDOSCOPY;  Service: Gastroenterology;  Laterality: N/A;  ? IR 3D INDEPENDENT WKST  06/09/2021   ? IR ANGIOGRAM SELECTIVE EACH ADDITIONAL VESSEL  06/09/2021  ? IR ANGIOGRAM SELECTIVE EACH ADDITIONAL VESSEL  06/09/2021  ? IR ANGIOGRAM SELECTIVE EACH ADDITIONAL VESSEL  06/09/2021  ? IR ANGIOGRAM SELECTIVE EACH ADDITIONAL VESSEL  08/18/2021  ? IR ANGIOGRAM SELECTIVE EACH ADDITIONAL VESSEL  08/18/2021  ? IR ANGIOGRAM SELECTIVE EACH ADDITIONAL VESSEL  08/18/2021  ? IR ANGIOGRAM SELECTIVE EACH ADDITIONAL VESSEL  10/20/2021  ? IR ANGIOGRAM SELECTIVE EACH ADDITIONAL VESSEL  10/20/2021  ? IR ANGIOGRAM VISCERAL SELECTIVE  06/09/2021  ? IR ANGIOGRAM VISCERAL SELECTIVE  08/18/2021  ? IR ANGIOGRAM VISCERAL SELECTIVE  09/01/2021  ? IR ANGIOGRAM VISCERAL SELECTIVE  10/20/2021  ? IR EMBO TUMOR ORGAN ISCHEMIA INFARCT INC GUIDE ROADMAPPING  09/01/2021  ? IR EMBO TUMOR ORGAN ISCHEMIA INFARCT INC GUIDE ROADMAPPING  10/20/2021  ? IR NEPHROSTOMY EXCHANGE LEFT  07/11/2020  ? IR NEPHROSTOMY EXCHANGE RIGHT  07/11/2020  ? IR NEPHROSTOMY PLACEMENT LEFT  05/23/2020  ? IR NEPHROSTOMY PLACEMENT RIGHT  05/23/2020  ? IR RADIOLOGIST EVAL & MGMT  04/28/2021  ? IR URETERAL STENT LEFT NEW ACCESS W/O SEP NEPHROSTOMY CATH  08/15/2020  ? IR URETERAL STENT RIGHT NEW ACCESS W/O SEP NEPHROSTOMY CATH  08/15/2020  ? IR US GUIDE VASC ACCESS RIGHT  06/09/2021  ? IR US GUIDE VASC ACCESS RIGHT  08/18/2021  ? IR US GUIDE VASC ACCESS RIGHT  09/01/2021  ? IR US GUIDE VASC ACCESS RIGHT  10/20/2021  ? PORTACATH PLACEMENT Left 05/29/2020  ? Procedure: INSERTION PORT-A-CATH;  Surgeon: [name], MD;  Location: [facility] ORS;  Service:  General;  Laterality: Left;  ? ? ?Allergies: ?Patient has no known allergies. ? ?Medications: ?Prior to Admission medications   ?Medication Sig Start Date End Date Taking? Authorizing Provider  ?acetaminophen (TYLENOL) 325 MG tablet Take 650 mg by mouth every 6 (six) hours as needed.    [provider]  ?cyclobenzaprine (FLEXERIL) 10 MG tablet Take 1 tablet (10 mg total) by mouth 3 (three) times daily as needed for muscle spasms. 11/06/21    Hughie Closs, PA-C  ?HYDROcodone-acetaminophen (NORCO) 5-325 MG tablet Take 1 tablet by mouth every 6 (six) hours as needed for moderate pain. 11/06/21   Hughie Closs, PA-C  ?lidocaine-prilocaine (EMLA) cream Apply 1 application topically as needed. Apply prior to appointment. Cover with plastic wrap. 11/05/20   [name], MD  ?Loperamide HCl (IMODIUM PO) Take by mouth.    [provider]  ?loratadine (CLARITIN) 10 MG tablet Take 10 mg by mouth daily.    [provider]  ?ondansetron (ZOFRAN) 4 MG tablet Take 1 tablet (4 mg total) by mouth every 8 (eight) hours as needed for nausea or vomiting. 11/05/20   [name], MD  ?pantoprazole (PROTONIX) 40 MG tablet Take 1 tablet (40 mg total) by mouth 2 (two) times daily. 11/05/20   [name], MD  ?prochlorperazine (COMPAZINE) 10 MG tablet Take 1 tablet (10 mg total) by mouth every 6 (six) hours as needed (Nausea or vomiting). 06/04/20 06/12/20  [name], MD  ?  ? ?Family History  ?Problem Relation Age of Onset  ? Other Mother   ?     Corticobasal degeneration  ? Uterine cancer Mother   ? Diabetes Maternal Grandmother   ? Stroke Maternal Grandfather   ? Stroke Paternal Grandfather   ? Heart disease Father   ? Pancreatic cancer Maternal Uncle 98  ? Cancer Paternal Aunt   ?     unk type  ? Liver cancer Maternal Uncle   ? Pancreatic cancer Cousin   ? Cancer Cousin   ?     unk type  ? ? ?Social History  ? ?Socioeconomic History  ? Marital status: Divorced  ?  Spouse name: Not on file  ? Number of children: 1  ? Years of education: Not on file  ? Highest education level: Not on file  ?Occupational History  ? Not on file  ?Tobacco Use  ? Smoking status: Former  ?  Types: Cigarettes  ? Smokeless tobacco: Never  ?Vaping Use  ? Vaping Use: Never used  ?Substance and Sexual Activity  ? Alcohol use: Never  ? Drug use: Never  ? Sexual activity: Not Currently  ?Other Topics Concern  ? Not on file  ?Social History Narrative  ?  Lives with dad, 1 daughter and 2 dogs  ? ?Social Determinants of Health  ? ?Financial Resource Strain: Not on file  ?Food Insecurity: Food Insecurity Present  ? Worried About Charity fundraiser in the Last Year: Sometimes true  ? Ran Out of Food in the Last Year: Sometimes true  ?Transportation Needs: No Transportation Needs  ? Lack of Transportation (Medical): No  ? Lack of Transportation (Non-Medical): No  ?Physical Activity: Not on file  ?Stress: Not on file  ?Social Connections: Socially Isolated  ? Frequency of Communication with Friends and Family: Once a week  ? Frequency of Social Gatherings with Friends and Family: More than three times a week  ? Attends Religious Services: Never  ? Active Member  of Clubs or Organizations: No  ? Attends Archivist Meetings: Never  ? Marital Status: Divorced  ? ? ? ?Vital Signs: ?There were no vitals taken for this visit. ? ?No physical examination was performed in lieu of virtual telephone clinic visit. ? ? ?Imaging: ?Left TARE Segmentectomy 10/20/21 ? ? ?Labs: ? ?CBC: ?Recent Labs  ?  08/18/21 ?0817 09/01/21 ?1173 09/15/21 ?1021 10/20/21 ?0751  ?WBC 8.2 7.9 12.4* 9.6  ?HGB 11.8* 11.0* 12.0 10.9*  ?HCT 37.7 34.3* 37.8 34.0*  ?PLT 326 322 335 213  ? ? ?COAGS: ?Recent Labs  ?  06/09/21 ?5670 08/18/21 ?1410 09/01/21 ?3013 10/20/21 ?0751  ?INR 1.0 1.0 1.0 1.1  ? ? ?BMP: ?Recent Labs  ?  08/18/21 ?0817 09/01/21 ?1438 09/15/21 ?1021 10/20/21 ?0751  ?NA 137 137 136 132*  ?K 4.0 3.6 4.0 3.6  ?CL 103 104 100 99  ?CO2 27 24 26 23   ?GLUCOSE 100* 100* 131* 109*  ?BUN 21* 19 22* 23*  ?CALCIUM 8.6* 8.7* 8.8* 8.3*  ?CREATININE 1.29* 1.14* 1.24* 1.43*  ?GFRNONAA 50* 58* 52* 44*  ? ? ?LIVER FUNCTION TESTS: ?Recent Labs  ?  08/18/21 ?0817 09/01/21 ?8875 09/15/21 ?1021 10/20/21 ?0751  ?BILITOT 0.5 0.6 0.3 0.6  ?AST 16 19 16 26   ?ALT 11 9 7 15   ?ALKPHOS 91 82 68 84  ?PROT 7.7 7.8 7.7 7.8  ?ALBUMIN 3.3* 3.4* 3.0* 3.0*  ? ? ?Assessment and Plan: ?56 year old female with history of  colorectal adenocarcinoma metastatic to the liver, now status post right lobar transarterial radioembolization on 09/01/21 and segment III radiation segmentectomy on 10/20/21.  She is recovering well.   ? ?Plan f

## 2021-11-30 ENCOUNTER — Other Ambulatory Visit: Payer: Self-pay | Admitting: *Deleted

## 2021-11-30 MED ORDER — HYDROCODONE-ACETAMINOPHEN 5-325 MG PO TABS: 1.0000 | ORAL_TABLET | Freq: Four times a day (QID) | ORAL | 0 refills | Status: DC | PRN

## 2021-11-30 MED ORDER — ONDANSETRON HCL 4 MG PO TABS: 4.0000 mg | ORAL_TABLET | Freq: Three times a day (TID) | ORAL | 2 refills | Status: DC | PRN

## 2021-12-08 ENCOUNTER — Other Ambulatory Visit: Payer: Self-pay | Admitting: Interventional Radiology

## 2021-12-08 DIAGNOSIS — C19 Malignant neoplasm of rectosigmoid junction: Secondary | ICD-10-CM | POA: Diagnosis not present

## 2021-12-11 ENCOUNTER — Other Ambulatory Visit: Payer: Self-pay | Admitting: Interventional Radiology

## 2021-12-11 DIAGNOSIS — C19 Malignant neoplasm of rectosigmoid junction: Secondary | ICD-10-CM

## 2021-12-13 NOTE — Progress Notes (Signed)
? ?Symptom Management Clinic ?[place] [facility] ?Telephone:([phone]) [phone] [phone]) [phone] ? ?Patient Care Team: ?[name], NP as PCP - General (Nurse Practitioner) ?[name], MD as Consulting Physician (Oncology) ?[name], RN as Oncology Nurse Navigator  ? ?Name of the patient: Anita Sullivan  ?[id number]  ?1966/01/07  ? ?Date of visit: 12/16/21 ? ?Reason for Consult: ?Elspeth Blucher is a 56 y.o. female who presents today for: ? ?Fatigue: Initially was here for normal follow up with Dr. Grayland Ormond however patient was dropped off to our clinic early with complains of fatigue. Patient reports fatigue is secondary to not eating in 5 days. Also not drinking (brings water bottle). Having 10/10 generalized abdominal pain. No fever, vomiting, constipation. Unable to tell me if she has taken anything for symptoms.  ? ?Denies any neurologic complaints. Denies chest pain. Denies any nausea, vomiting, constipation, or diarrhea. Denies urinary complaints. Patient offers no further specific complaints today. ? ? ? ?PAST MEDICAL HISTORY: ?Past Medical History:  ?Diagnosis Date  ? Cancer associated pain   ? Colon cancer ([place])   ? Family history of pancreatic cancer   ? Family history of uterine cancer   ? ? ?PAST SURGICAL HISTORY:  ?Past Surgical History:  ?Procedure Laterality Date  ? COLONOSCOPY WITH PROPOFOL N/A 05/27/2020  ? Procedure: COLONOSCOPY WITH PROPOFOL;  Surgeon: [name], MD;  Location: [facility] ENDOSCOPY;  Service: Gastroenterology;  Laterality: N/A;  ? FLEXIBLE SIGMOIDOSCOPY N/A 05/28/2020  ? Procedure: FLEXIBLE SIGMOIDOSCOPY;  Surgeon: [name], MD;  Location: [facility] ENDOSCOPY;  Service: Gastroenterology;  Laterality: N/A;  ? IR 3D INDEPENDENT WKST  06/09/2021  ? IR ANGIOGRAM SELECTIVE EACH ADDITIONAL VESSEL  06/09/2021  ? IR ANGIOGRAM SELECTIVE EACH ADDITIONAL VESSEL  06/09/2021  ? IR ANGIOGRAM SELECTIVE EACH ADDITIONAL VESSEL  06/09/2021   ? IR ANGIOGRAM SELECTIVE EACH ADDITIONAL VESSEL  08/18/2021  ? IR ANGIOGRAM SELECTIVE EACH ADDITIONAL VESSEL  08/18/2021  ? IR ANGIOGRAM SELECTIVE EACH ADDITIONAL VESSEL  08/18/2021  ? IR ANGIOGRAM SELECTIVE EACH ADDITIONAL VESSEL  10/20/2021  ? IR ANGIOGRAM SELECTIVE EACH ADDITIONAL VESSEL  10/20/2021  ? IR ANGIOGRAM VISCERAL SELECTIVE  06/09/2021  ? IR ANGIOGRAM VISCERAL SELECTIVE  08/18/2021  ? IR ANGIOGRAM VISCERAL SELECTIVE  09/01/2021  ? IR ANGIOGRAM VISCERAL SELECTIVE  10/20/2021  ? IR EMBO TUMOR ORGAN ISCHEMIA INFARCT INC GUIDE ROADMAPPING  09/01/2021  ? IR EMBO TUMOR ORGAN ISCHEMIA INFARCT INC GUIDE ROADMAPPING  10/20/2021  ? IR NEPHROSTOMY EXCHANGE LEFT  07/11/2020  ? IR NEPHROSTOMY EXCHANGE RIGHT  07/11/2020  ? IR NEPHROSTOMY PLACEMENT LEFT  05/23/2020  ? IR NEPHROSTOMY PLACEMENT RIGHT  05/23/2020  ? IR RADIOLOGIST EVAL & MGMT  04/28/2021  ? IR URETERAL STENT LEFT NEW ACCESS W/O SEP NEPHROSTOMY CATH  08/15/2020  ? IR URETERAL STENT RIGHT NEW ACCESS W/O SEP NEPHROSTOMY CATH  08/15/2020  ? IR US GUIDE VASC ACCESS RIGHT  06/09/2021  ? IR US GUIDE VASC ACCESS RIGHT  08/18/2021  ? IR US GUIDE VASC ACCESS RIGHT  09/01/2021  ? IR US GUIDE VASC ACCESS RIGHT  10/20/2021  ? PORTACATH PLACEMENT Left 05/29/2020  ? Procedure: INSERTION PORT-A-CATH;  Surgeon: [name], MD;  Location: [facility] ORS;  Service: General;  Laterality: Left;  ? ? ?HEMATOLOGY/ONCOLOGY HISTORY:  ?Oncology History  ?Primary malignant neoplasm of colorectal area with metastasis ([place])  ?05/28/2020 Initial Diagnosis  ? Metastatic colorectal cancer ([place]) ? ?  ?05/31/2020 Cancer Staging  ? Staging form: Colon and Rectum, AJCC  8th Edition ?- Clinical stage from 05/31/2020: Stage IVC (cTX, cNX, pM1c) - Signed by [name], MD on 05/31/2020 ? ?  ?06/04/2020 - 06/06/2020 Chemotherapy  ? The patient had palonosetron (ALOXI) injection 0.25 mg, 0.25 mg, Intravenous,  Once, 1 of 12 cycles ?Administration: 0.25 mg (06/04/2020) ?leucovorin 650 mg in dextrose 5 % 250 mL  infusion, 632 mg, Intravenous,  Once, 1 of 12 cycles ?Administration: 650 mg (06/04/2020) ?oxaliplatin (ELOXATIN) 135 mg in dextrose 5 % 500 mL chemo infusion, 85 mg/m2 = 135 mg, Intravenous,  Once, 1 of 12 cycles ?Administration: 135 mg (06/04/2020) ?fluorouracil (ADRUCIL) chemo injection 650 mg, 400 mg/m2 = 650 mg, Intravenous,  Once, 1 of 12 cycles ?Administration: 650 mg (06/04/2020) ?fluorouracil (ADRUCIL) 3,800 mg in sodium chloride 0.9 % 74 mL chemo infusion, 2,400 mg/m2 = 3,800 mg, Intravenous, 1 Day/Dose, 1 of 12 cycles ?Administration: 3,800 mg (06/04/2020) ?bevacizumab-bvzr (ZIRABEV) 300 mg in sodium chloride 0.9 % 100 mL chemo infusion, 5 mg/kg = 300 mg, Intravenous,  Once, 0 of 11 cycles ? ? for chemotherapy treatment.  ? ?  ?06/18/2020 - 12/12/2020 Chemotherapy  ?  ? ?  ? ?  ? Genetic Testing  ? Negative genetic testing. No pathogenic variants identified on the [employer] CustomNext-Cancer +RNA panel. The report date is 07/10/2020. ? ?The CustomNext-Cancer + RNAinsight panel  includes sequencing and/or deletion duplication testing of the following 91 genes: AIP, ALK, APC*, ATM*, AXIN2, BAP1, BARD1, BLM, BMPR1A, BRCA1*, BRCA2*, BRIP1*, CDC73, CDH1*, CDK4, CDKN1B, CDKN2A, CHEK2*, CTNNA1, DICER1, FANCC, FH, FLCN, GALNT12, KIF1B, LZTR1, MAX, MEN1, MET, MLH1*, MRE11A, MSH2*, MSH3, MSH6*, MUTYH*, NBN, NF1*, NF2, NTHL1, PALB2*, PHOX2B, PMS2*, POT1, PRKAR1A, PTCH1, PTEN*, RAD50, RAD51C*, RAD51D*, RB1, RECQL, RET, SDHA, SDHAF2, SDHB, SDHC, SDHD, SMAD4, SMARCA4, SMARCB1, SMARCE1, STK11, SUFU, TMEM127, TP53*, TSC1, TSC2, VHL and XRCC2 (sequencing and deletion/duplication); CASR, CFTR, CPA1, CTRC, EGFR, EGLN1, FAM175A, HOXB13, KIT, MITF, MLH3, PALLD, PDGFRA, POLD1, POLE, PRSS1, RINT1, RPS20, SPINK1 and TERT (sequencing only); EPCAM and GREM1 (deletion/duplication only).  ?  ? ? ?ALLERGIES:  has No Known Allergies. ? ?MEDICATIONS:  ?Current Outpatient Medications  ?Medication Sig Dispense Refill  ? acetaminophen (TYLENOL) 325  MG tablet Take 650 mg by mouth every 6 (six) hours as needed.    ? cyclobenzaprine (FLEXERIL) 10 MG tablet Take 1 tablet (10 mg total) by mouth 3 (three) times daily as needed for muscle spasms. 60 tablet 1  ? HYDROcodone-acetaminophen (NORCO) 5-325 MG tablet Take 1 tablet by mouth every 6 (six) hours as needed for moderate pain. 60 tablet 0  ? lidocaine-prilocaine (EMLA) cream Apply 1 application topically as needed. Apply prior to appointment. Cover with plastic wrap. 30 g 2  ? Loperamide HCl (IMODIUM PO) Take by mouth.    ? loratadine (CLARITIN) 10 MG tablet Take 10 mg by mouth daily.    ? ondansetron (ZOFRAN) 4 MG tablet Take 1 tablet (4 mg total) by mouth every 8 (eight) hours as needed for nausea or vomiting. 60 tablet 2  ? pantoprazole (PROTONIX) 40 MG tablet Take 1 tablet (40 mg total) by mouth 2 (two) times daily. 60 tablet 0  ? ?No current facility-administered medications for this visit.  ? ? ?VITAL SIGNS: ?BP 97/67   Pulse 94   Temp (!) 96.1 ?F (35.6 ?C) (Tympanic)   Resp 18  ?There were no vitals filed for this visit.  ?Estimated body mass index is 24.02 kg/m? as calculated from the following: ?  Height as of 10/20/21: 5' (1.524 m). ?  Weight as of 10/20/21: 123 lb (55.8 kg). ? ?LABS: ?CBC: ?   ?Component Value Date/Time  ? WBC 9.6 10/20/2021 0751  ? HGB 10.9 (L) 10/20/2021 0751  ? HGB 13.0 09/04/2018 1434  ? HCT 34.0 (L) 10/20/2021 0751  ? HCT 37.4 09/04/2018 1434  ? PLT 213 10/20/2021 0751  ? PLT 331 09/04/2018 1434  ? MCV 95.5 10/20/2021 0751  ? MCV 92 09/04/2018 1434  ? NEUTROABS 7.3 10/20/2021 0751  ? NEUTROABS 5.2 09/04/2018 1434  ? LYMPHSABS 1.2 10/20/2021 0751  ? LYMPHSABS 1.9 09/04/2018 1434  ? MONOABS 0.9 10/20/2021 0751  ? EOSABS 0.1 10/20/2021 0751  ? EOSABS 0.2 09/04/2018 1434  ? BASOSABS 0.0 10/20/2021 0751  ? BASOSABS 0.0 09/04/2018 1434  ? ?Comprehensive Metabolic Panel: ?   ?Component Value Date/Time  ? NA 132 (L) 10/20/2021 0751  ? K 3.6 10/20/2021 0751  ? CL 99 10/20/2021 0751  ? CO2  23 10/20/2021 0751  ? BUN 23 (H) 10/20/2021 0751  ? CREATININE 1.43 (H) 10/20/2021 0751  ? GLUCOSE 109 (H) 10/20/2021 0751  ? CALCIUM 8.3 (L) 10/20/2021 0751  ? AST 26 10/20/2021 0751  ? ALT 15 03/21/

## 2021-12-15 ENCOUNTER — Other Ambulatory Visit: Payer: Self-pay | Admitting: Oncology

## 2021-12-15 DIAGNOSIS — C799 Secondary malignant neoplasm of unspecified site: Secondary | ICD-10-CM

## 2021-12-16 ENCOUNTER — Inpatient Hospital Stay
Admission: EM | Admit: 2021-12-16 | Discharge: 2021-12-21 | DRG: 871 | Disposition: A | Discharge status: Hospice | Payer: Medicaid Other | Attending: Internal Medicine | Admitting: Internal Medicine

## 2021-12-16 ENCOUNTER — Other Ambulatory Visit: Payer: Self-pay

## 2021-12-16 ENCOUNTER — Inpatient Hospital Stay: Payer: Medicaid Other | Attending: Oncology

## 2021-12-16 ENCOUNTER — Emergency Department: Payer: Medicaid Other

## 2021-12-16 ENCOUNTER — Inpatient Hospital Stay (HOSPITAL_BASED_OUTPATIENT_CLINIC_OR_DEPARTMENT_OTHER): Payer: Medicaid Other | Admitting: Medical Oncology

## 2021-12-16 ENCOUNTER — Inpatient Hospital Stay: Payer: Medicaid Other

## 2021-12-16 VITALS — BP 97/67 | HR 94 | Temp 96.1°F | Resp 18

## 2021-12-16 DIAGNOSIS — N823 Fistula of vagina to large intestine: Secondary | ICD-10-CM | POA: Diagnosis not present

## 2021-12-16 DIAGNOSIS — N1330 Unspecified hydronephrosis: Secondary | ICD-10-CM | POA: Diagnosis not present

## 2021-12-16 DIAGNOSIS — T83193A Other mechanical complication of other urinary stent, initial encounter: Secondary | ICD-10-CM | POA: Diagnosis not present

## 2021-12-16 DIAGNOSIS — K219 Gastro-esophageal reflux disease without esophagitis: Secondary | ICD-10-CM | POA: Diagnosis not present

## 2021-12-16 DIAGNOSIS — N1831 Chronic kidney disease, stage 3a: Secondary | ICD-10-CM | POA: Diagnosis not present

## 2021-12-16 DIAGNOSIS — E871 Hypo-osmolality and hyponatremia: Secondary | ICD-10-CM | POA: Diagnosis not present

## 2021-12-16 DIAGNOSIS — C19 Malignant neoplasm of rectosigmoid junction: Secondary | ICD-10-CM | POA: Diagnosis present

## 2021-12-16 DIAGNOSIS — E43 Unspecified severe protein-calorie malnutrition: Secondary | ICD-10-CM | POA: Diagnosis not present

## 2021-12-16 DIAGNOSIS — D649 Anemia, unspecified: Secondary | ICD-10-CM | POA: Diagnosis present

## 2021-12-16 DIAGNOSIS — Z800 Family history of malignant neoplasm of digestive organs: Secondary | ICD-10-CM

## 2021-12-16 DIAGNOSIS — Z452 Encounter for adjustment and management of vascular access device: Secondary | ICD-10-CM | POA: Diagnosis not present

## 2021-12-16 DIAGNOSIS — Z87891 Personal history of nicotine dependence: Secondary | ICD-10-CM

## 2021-12-16 DIAGNOSIS — R6520 Severe sepsis without septic shock: Secondary | ICD-10-CM | POA: Diagnosis present

## 2021-12-16 DIAGNOSIS — Z8249 Family history of ischemic heart disease and other diseases of the circulatory system: Secondary | ICD-10-CM

## 2021-12-16 DIAGNOSIS — Z79899 Other long term (current) drug therapy: Secondary | ICD-10-CM | POA: Insufficient documentation

## 2021-12-16 DIAGNOSIS — Z8049 Family history of malignant neoplasm of other genital organs: Secondary | ICD-10-CM | POA: Diagnosis not present

## 2021-12-16 DIAGNOSIS — R9431 Abnormal electrocardiogram [ECG] [EKG]: Secondary | ICD-10-CM | POA: Diagnosis not present

## 2021-12-16 DIAGNOSIS — E860 Dehydration: Secondary | ICD-10-CM | POA: Insufficient documentation

## 2021-12-16 DIAGNOSIS — C787 Secondary malignant neoplasm of liver and intrahepatic bile duct: Secondary | ICD-10-CM | POA: Diagnosis not present

## 2021-12-16 DIAGNOSIS — N139 Obstructive and reflux uropathy, unspecified: Secondary | ICD-10-CM | POA: Diagnosis not present

## 2021-12-16 DIAGNOSIS — R6510 Systemic inflammatory response syndrome (SIRS) of non-infectious origin without acute organ dysfunction: Secondary | ICD-10-CM | POA: Diagnosis present

## 2021-12-16 DIAGNOSIS — Z923 Personal history of irradiation: Secondary | ICD-10-CM

## 2021-12-16 DIAGNOSIS — R7881 Bacteremia: Secondary | ICD-10-CM | POA: Diagnosis not present

## 2021-12-16 DIAGNOSIS — R531 Weakness: Secondary | ICD-10-CM | POA: Diagnosis not present

## 2021-12-16 DIAGNOSIS — R627 Adult failure to thrive: Secondary | ICD-10-CM | POA: Diagnosis not present

## 2021-12-16 DIAGNOSIS — E875 Hyperkalemia: Secondary | ICD-10-CM | POA: Diagnosis not present

## 2021-12-16 DIAGNOSIS — C189 Malignant neoplasm of colon, unspecified: Secondary | ICD-10-CM | POA: Diagnosis not present

## 2021-12-16 DIAGNOSIS — Z95828 Presence of other vascular implants and grafts: Secondary | ICD-10-CM

## 2021-12-16 DIAGNOSIS — Z515 Encounter for palliative care: Secondary | ICD-10-CM

## 2021-12-16 DIAGNOSIS — R5381 Other malaise: Secondary | ICD-10-CM | POA: Diagnosis present

## 2021-12-16 DIAGNOSIS — N1832 Chronic kidney disease, stage 3b: Secondary | ICD-10-CM | POA: Diagnosis not present

## 2021-12-16 DIAGNOSIS — Z9221 Personal history of antineoplastic chemotherapy: Secondary | ICD-10-CM | POA: Diagnosis not present

## 2021-12-16 DIAGNOSIS — A4151 Sepsis due to Escherichia coli [E. coli]: Secondary | ICD-10-CM | POA: Diagnosis not present

## 2021-12-16 DIAGNOSIS — R5383 Other fatigue: Secondary | ICD-10-CM | POA: Diagnosis not present

## 2021-12-16 DIAGNOSIS — N179 Acute kidney failure, unspecified: Secondary | ICD-10-CM | POA: Diagnosis not present

## 2021-12-16 DIAGNOSIS — E872 Acidosis, unspecified: Secondary | ICD-10-CM | POA: Diagnosis not present

## 2021-12-16 DIAGNOSIS — N136 Pyonephrosis: Secondary | ICD-10-CM | POA: Diagnosis not present

## 2021-12-16 DIAGNOSIS — Z960 Presence of urogenital implants: Secondary | ICD-10-CM | POA: Diagnosis not present

## 2021-12-16 DIAGNOSIS — N390 Urinary tract infection, site not specified: Secondary | ICD-10-CM | POA: Diagnosis present

## 2021-12-16 DIAGNOSIS — G893 Neoplasm related pain (acute) (chronic): Secondary | ICD-10-CM | POA: Diagnosis present

## 2021-12-16 DIAGNOSIS — Z823 Family history of stroke: Secondary | ICD-10-CM

## 2021-12-16 DIAGNOSIS — Z6821 Body mass index (BMI) 21.0-21.9, adult: Secondary | ICD-10-CM | POA: Diagnosis not present

## 2021-12-16 DIAGNOSIS — R1084 Generalized abdominal pain: Secondary | ICD-10-CM | POA: Diagnosis not present

## 2021-12-16 DIAGNOSIS — Z833 Family history of diabetes mellitus: Secondary | ICD-10-CM

## 2021-12-16 DIAGNOSIS — D259 Leiomyoma of uterus, unspecified: Secondary | ICD-10-CM | POA: Diagnosis not present

## 2021-12-16 DIAGNOSIS — R109 Unspecified abdominal pain: Secondary | ICD-10-CM | POA: Diagnosis present

## 2021-12-16 DIAGNOSIS — T83192A Other mechanical complication of urinary stent, initial encounter: Secondary | ICD-10-CM | POA: Diagnosis not present

## 2021-12-16 DIAGNOSIS — Z66 Do not resuscitate: Secondary | ICD-10-CM | POA: Diagnosis present

## 2021-12-16 LAB — PROCALCITONIN: Procalcitonin: 3.9 ng/mL

## 2021-12-16 LAB — CBC WITH DIFFERENTIAL/PLATELET
Abs Immature Granulocytes: 1.21 10*3/uL — ABNORMAL HIGH (ref 0.00–0.07)
Basophils Absolute: 0.2 10*3/uL — ABNORMAL HIGH (ref 0.0–0.1)
Basophils Relative: 0 %
Eosinophils Absolute: 0 10*3/uL (ref 0.0–0.5)
Eosinophils Relative: 0 %
HCT: 38.1 % (ref 36.0–46.0)
Hemoglobin: 11.6 g/dL — ABNORMAL LOW (ref 12.0–15.0)
Immature Granulocytes: 3 %
Lymphocytes Relative: 2 %
Lymphs Abs: 0.7 10*3/uL (ref 0.7–4.0)
MCH: 29.8 pg (ref 26.0–34.0)
MCHC: 30.4 g/dL (ref 30.0–36.0)
MCV: 97.9 fL (ref 80.0–100.0)
Monocytes Absolute: 1.1 10*3/uL — ABNORMAL HIGH (ref 0.1–1.0)
Monocytes Relative: 3 %
Neutro Abs: 40.7 10*3/uL — ABNORMAL HIGH (ref 1.7–7.7)
Neutrophils Relative %: 92 %
Platelets: 215 10*3/uL (ref 150–400)
RBC: 3.89 MIL/uL (ref 3.87–5.11)
RDW: 18.6 % — ABNORMAL HIGH (ref 11.5–15.5)
Smear Review: NORMAL
WBC: 43.9 10*3/uL — ABNORMAL HIGH (ref 4.0–10.5)
nRBC: 0 % (ref 0.0–0.2)

## 2021-12-16 LAB — MRSA NEXT GEN BY PCR, NASAL: MRSA by PCR Next Gen: NOT DETECTED

## 2021-12-16 LAB — BASIC METABOLIC PANEL
Anion gap: 14 (ref 5–15)
BUN: 109 mg/dL — ABNORMAL HIGH (ref 6–20)
CO2: 10 mmol/L — ABNORMAL LOW (ref 22–32)
Calcium: 6.9 mg/dL — ABNORMAL LOW (ref 8.9–10.3)
Chloride: 109 mmol/L (ref 98–111)
Creatinine, Ser: 6.75 mg/dL — ABNORMAL HIGH (ref 0.44–1.00)
GFR, Estimated: 7 mL/min — ABNORMAL LOW (ref >60)
Glucose, Bld: 92 mg/dL (ref 70–99)
Potassium: 4.5 mmol/L (ref 3.5–5.1)
Sodium: 133 mmol/L — ABNORMAL LOW (ref 135–145)

## 2021-12-16 LAB — COMPREHENSIVE METABOLIC PANEL
ALT: 8 U/L (ref 0–44)
AST: 15 U/L (ref 15–41)
Albumin: 2.1 g/dL — ABNORMAL LOW (ref 3.5–5.0)
Alkaline Phosphatase: 164 U/L — ABNORMAL HIGH (ref 38–126)
Anion gap: 17 — ABNORMAL HIGH (ref 5–15)
BUN: UNDETERMINED mg/dL (ref 6–20)
CO2: 10 mmol/L — ABNORMAL LOW (ref 22–32)
Calcium: 7.5 mg/dL — ABNORMAL LOW (ref 8.9–10.3)
Chloride: 102 mmol/L (ref 98–111)
Creatinine, Ser: 7.48 mg/dL — ABNORMAL HIGH (ref 0.44–1.00)
GFR, Estimated: 6 mL/min — ABNORMAL LOW (ref >60)
Glucose, Bld: 96 mg/dL (ref 70–99)
Potassium: 5.2 mmol/L — ABNORMAL HIGH (ref 3.5–5.1)
Sodium: 129 mmol/L — ABNORMAL LOW (ref 135–145)
Total Bilirubin: 1.6 mg/dL — ABNORMAL HIGH (ref 0.3–1.2)
Total Protein: 7.4 g/dL (ref 6.5–8.1)

## 2021-12-16 LAB — PROTIME-INR
INR: 1.7 — ABNORMAL HIGH (ref 0.8–1.2)
Prothrombin Time: 19.6 seconds — ABNORMAL HIGH (ref 11.4–15.2)

## 2021-12-16 LAB — LACTIC ACID, PLASMA
Lactic Acid, Venous: 1.7 mmol/L (ref 0.5–1.9)
Lactic Acid, Venous: 0.9 mmol/L (ref 0.5–1.9)

## 2021-12-16 LAB — GLUCOSE, CAPILLARY: Glucose-Capillary: 72 mg/dL (ref 70–99)

## 2021-12-16 MED ORDER — HYDROCODONE-ACETAMINOPHEN 5-325 MG PO TABS
1.0000 | ORAL_TABLET | Freq: Four times a day (QID) | ORAL | Status: DC | PRN
Start: 2021-12-16 — End: 2021-12-21
  Administered 2021-12-19: 1 via ORAL
  Filled 2021-12-16: qty 1

## 2021-12-16 MED ORDER — LIDOCAINE HCL URETHRAL/MUCOSAL 2 % EX GEL
1.0000 "application " | Freq: Once | CUTANEOUS | Status: AC
  Administered 2021-12-16: 1 via URETHRAL
  Filled 2021-12-16: qty 5

## 2021-12-16 MED ORDER — LACTATED RINGERS IV SOLN: INTRAVENOUS | Status: DC

## 2021-12-16 MED ORDER — PANTOPRAZOLE SODIUM 40 MG PO TBEC
40.0000 mg | DELAYED_RELEASE_TABLET | Freq: Two times a day (BID) | ORAL | Status: DC
Start: 2021-12-16 — End: 2021-12-21
  Administered 2021-12-16 – 2021-12-21 (×7): 40 mg via ORAL
  Filled 2021-12-16 (×8): qty 1

## 2021-12-16 MED ORDER — CHLORHEXIDINE GLUCONATE CLOTH 2 % EX PADS
6.0000 | MEDICATED_PAD | Freq: Every day | CUTANEOUS | Status: DC
  Administered 2021-12-16 – 2021-12-19 (×3): 6 via TOPICAL

## 2021-12-16 MED ORDER — MIDODRINE HCL 5 MG PO TABS
10.0000 mg | ORAL_TABLET | Freq: Three times a day (TID) | ORAL | Status: DC
  Administered 2021-12-17 (×3): 10 mg via ORAL
  Filled 2021-12-16 (×3): qty 2

## 2021-12-16 MED ORDER — FENTANYL CITRATE PF 50 MCG/ML IJ SOSY
50.0000 ug | PREFILLED_SYRINGE | INTRAMUSCULAR | Status: DC | PRN
  Administered 2021-12-16 – 2021-12-21 (×14): 50 ug via INTRAVENOUS
  Filled 2021-12-16 (×15): qty 1

## 2021-12-16 MED ORDER — SODIUM CHLORIDE 0.9 % IV BOLUS
1000.0000 mL | Freq: Once | INTRAVENOUS | Status: AC
  Administered 2021-12-16: 1000 mL via INTRAVENOUS

## 2021-12-16 MED ORDER — CYCLOBENZAPRINE HCL 10 MG PO TABS
10.0000 mg | ORAL_TABLET | Freq: Three times a day (TID) | ORAL | Status: DC | PRN
  Filled 2021-12-16: qty 1

## 2021-12-16 MED ORDER — SODIUM CHLORIDE 0.9 % IV BOLUS (SEPSIS): 1000.0000 mL | Freq: Once | INTRAVENOUS | Status: DC

## 2021-12-16 MED ORDER — SODIUM CHLORIDE 0.9 % IV SOLN
1000.0000 mL | INTRAVENOUS | Status: DC
  Administered 2021-12-17: 1000 mL via INTRAVENOUS

## 2021-12-16 MED ORDER — ALTEPLASE 2 MG IJ SOLR
2.0000 mg | Freq: Once | INTRAMUSCULAR | Status: DC
  Filled 2021-12-16: qty 2

## 2021-12-16 MED ORDER — SODIUM CHLORIDE 0.9 % IV BOLUS (SEPSIS)
1000.0000 mL | Freq: Once | INTRAVENOUS | Status: AC
  Administered 2021-12-16: 1000 mL via INTRAVENOUS

## 2021-12-16 MED ORDER — LORATADINE 10 MG PO TABS: 10.0000 mg | ORAL_TABLET | Freq: Every day | ORAL | Status: DC | PRN

## 2021-12-16 MED ORDER — HEPARIN SOD (PORK) LOCK FLUSH 100 UNIT/ML IV SOLN
500.0000 [IU] | Freq: Once | INTRAVENOUS | Status: AC
  Administered 2021-12-16: 500 [IU] via INTRAVENOUS
  Filled 2021-12-16: qty 5

## 2021-12-16 MED ORDER — SODIUM ZIRCONIUM CYCLOSILICATE 5 G PO PACK
10.0000 g | PACK | Freq: Once | ORAL | Status: DC
  Filled 2021-12-16: qty 1

## 2021-12-16 MED ORDER — ACETAMINOPHEN 325 MG PO TABS: 650.0000 mg | ORAL_TABLET | Freq: Four times a day (QID) | ORAL | Status: DC | PRN

## 2021-12-16 MED ORDER — SODIUM CHLORIDE 0.9 % IV SOLN
2.0000 g | Freq: Once | INTRAVENOUS | Status: AC
  Administered 2021-12-16: 2 g via INTRAVENOUS
  Filled 2021-12-16: qty 12.5

## 2021-12-16 MED ORDER — HEPARIN SODIUM (PORCINE) 5000 UNIT/ML IJ SOLN: 5000.0000 [IU] | Freq: Three times a day (TID) | INTRAMUSCULAR | Status: DC

## 2021-12-16 MED ORDER — SODIUM CHLORIDE 0.9 % IV SOLN: 2.0000 g | INTRAVENOUS | Status: DC

## 2021-12-16 MED ORDER — SODIUM CHLORIDE 0.9% FLUSH
10.0000 mL | Freq: Once | INTRAVENOUS | Status: AC
  Administered 2021-12-16: 10 mL via INTRAVENOUS
  Filled 2021-12-16: qty 10

## 2021-12-16 MED ORDER — DIPHENHYDRAMINE HCL 50 MG/ML IJ SOLN: 12.5000 mg | Freq: Three times a day (TID) | INTRAMUSCULAR | Status: DC | PRN

## 2021-12-16 MED ORDER — MORPHINE SULFATE (PF) 2 MG/ML IV SOLN
2.0000 mg | INTRAVENOUS | Status: DC | PRN
  Administered 2021-12-16: 2 mg via INTRAVENOUS
  Filled 2021-12-16 (×2): qty 1

## 2021-12-16 NOTE — ED Notes (Signed)
Informed RN bed assigned 

## 2021-12-16 NOTE — Progress Notes (Signed)
Pt brought in and dropped off for scheduled appointment.  Pt very pale, weak and unable to walk without assistance.  Reports has not eaten in 3 days.   ?

## 2021-12-16 NOTE — Progress Notes (Signed)
Pt transported to ED via Wheelchair with Lora Paula RN ?

## 2021-12-16 NOTE — Assessment & Plan Note (Addendum)
Likely due to worsening bilateral hydronephrosis.  Patient may also have urinary infection ?-Consulted Dr. Holley Raring of renal ?-Consulted Dr. Erlene Quan of urology ?-Started cefepime for possible urine infection ?-IVF ?-Avoid using renal toxic medications ?

## 2021-12-16 NOTE — Assessment & Plan Note (Signed)
K 5.2 ?-Leukoma 10 g ?

## 2021-12-16 NOTE — Assessment & Plan Note (Signed)
Sodium 129, most likely due to decreased oral intake ?-IV fluid: 3 L normal saline, then by 50 cc/h for LR ? ?

## 2021-12-16 NOTE — Assessment & Plan Note (Signed)
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 The stents are likely nonfunctional, and replacement should be considered. ? - Consulted Dr. Erlene Quan of urology ?

## 2021-12-16 NOTE — Consult Note (Addendum)
NAME:  Anita Sullivan, MRN:  [id number], DOB:  04-12-1966, LOS: 0 ADMISSION DATE:  12/16/2021, CONSULTATION DATE:  12/16/21 REFERRING MD:  Monika Salk CHIEF COMPLAINT:  Abdominal pain, Generalized weakness   HPI  56  y.o female  with significant PMH of metastatic colorectal cancer, obstructive uropathy and hydronephrosis s/p bilateral ureteral stent placement, CKD stage III, GERD, and GI bleed who presented to the ED with chief complaints of abdominal pain and generalized weakness.  Per ED reports and patient's chart, patient had an appointment with her oncologist today and was noted to be pale, ill appearing, tachycardic and hypotensive. She reported that she has not been able to tolerate po intake for the past 3-5 days. Patient was given IVFs at the clinic with resolution of tachycardia and improvement of BP. Due to concerns of disease progression and c/o abdominal pain, she was sent to the ED for further evaluation  ED Course: In the emergency department, the temperature was 36.4C, the heart rate 104 beats/minute, the blood pressure 93/78 mm Hg, the respiratory rate 22 breaths/minute, and the oxygen saturation 95% on RA. Pertinent Labs in Red/Diagnostics Findings: Na+/ K+: 129/5.2 Glucose: 96 CO2:10 Cr.:7.48   WBC/ TMAX: 43.9/ afebrile Hgb/Hct: 11.6/38.1 PCT: 3.90 Lactic acid: 1.7  CT abdomen/pelvis showed progressive local maligancy (colon cancer)New moderate bilateral hydronephrosis attributed to prominent ureteral stent encrustation in the bladder. The stents are likely nonfunctional and new nodulairity at the left left lung base concerning for progressive metastatic disease.  Patient received IVFs boluses and started on broad spectrum abx with Cefepime. Patient admitted to [facility] by hospitalisr. Dr. Erlene Quan of urology and Nephrologist Dr. Holley Raring  consulted. Due to difficulty with obtaining IV access,worsening renal function, malfunctioning port and possible decline in status, PCCM  consulted to assist with management.  Past Medical History  metastatic colorectal cancer, obstructive uropathy and hydronephrosis s/p bilateral ureteral stent placement, CKD stage III, GERD, and GI bleed  Significant Hospital Events   5/17: Admitted to SDU with AKI on CKD due to obstructive uropathy and worsening bilateral hydronephrosis. PCCM consulted.  Consults:  Nephrology Urology PCCM  Procedures:  5/17: Right IJ central line  Significant Diagnostic Tests:  5/17: CT abdomen and pelvis>1. Progressive pelvic soft tissue nodularity and nodular mural thickening of the rectosigmoid colon consistent with progressive local malignancy (colon cancer). 2. 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 3. New moderate bilateral hydronephrosis attributed to prominent ureteral stent encrustation in the bladder. The stents are likely nonfunctional, and replacement should be considered. 4. Known hepatic metastatic disease is not optimally visualized, but does not appear significantly progressive. 5. New nodularity at the left lung base, suspicious for progressive metastatic disease.  Micro Data:  5/17: Blood culture x2> 5/17: Urine Culture> 5/17: MRSA PCR>>   Antimicrobials:  Vancomycin 5/18 Cefepime 5/17>  OBJECTIVE  Blood pressure 95/64, pulse 94, temperature 97.8 F (36.6 C), temperature source Axillary, resp. rate 17, height '5\' 3"'$  (1.6 m), weight 55.4 kg, SpO2 98 %.       No intake or output data in the 24 hours ending 12/16/21 2000 Filed Weights   12/16/21 1905  Weight: 55.4 kg   Physical Examination  GENERAL: 56 year-old critically ill patient lying in the bed with no acute distress.  EYES: Pupils equal, round, reactive to light and accommodation. No scleral icterus. Extraocular muscles intact.  HEENT: Head atraumatic, normocephalic. Oropharynx and nasopharynx clear.  NECK:  Supple, no jugular venous distention.  No thyroid  enlargement, no tenderness.  LUNGS: Normal breath sounds bilaterally, no wheezing, rales,rhonchi or crepitation. No use of accessory muscles of respiration.  CARDIOVASCULAR: S1, S2 normal. No murmurs, rubs, or gallops.  ABDOMEN: Soft, nontender, nondistended. Bowel sounds present. No organomegaly or mass.  EXTREMITIES: No pedal edema, cyanosis, or clubbing.  NEUROLOGIC: Cranial nerves II through XII are intact.  Muscle strength 5/5 in all extremities. Sensation intact. Gait not checked.  PSYCHIATRIC: The patient is alert and oriented x 1-2.  SKIN: No obvious rash, lesion, or ulcer.   Labs/imaging that I havepersonally reviewed  (right click and "Reselect all SmartList Selections" daily)     Labs   CBC: Recent Labs  Lab 12/16/21 1320  WBC 43.9*  NEUTROABS 40.7*  HGB 11.6*  HCT 38.1  MCV 97.9  PLT 409    Basic Metabolic Panel: Recent Labs  Lab 12/16/21 1402  NA 129*  K 5.2*  CL 102  CO2 10*  GLUCOSE 96  BUN QUANTITY NOT SUFFICIENT, UNABLE TO PERFORM TEST  CREATININE 7.48*  CALCIUM 7.5*   GFR: Estimated Creatinine Clearance: 7.2 mL/min (A) (by C-G formula based on SCr of 7.48 mg/dL (H)). Recent Labs  Lab 12/16/21 1320 12/16/21 1357  WBC 43.9*  --   LATICACIDVEN  --  1.7    Liver Function Tests: Recent Labs  Lab 12/16/21 1402  AST 15  ALT 8  ALKPHOS 164*  BILITOT 1.6*  PROT 7.4  ALBUMIN 2.1*   No results for input(s): LIPASE, AMYLASE in the last 168 hours. No results for input(s): AMMONIA in the last 168 hours.  ABG No results found for: PHART, PCO2ART, PO2ART, HCO3, TCO2, ACIDBASEDEF, O2SAT   Coagulation Profile: Recent Labs  Lab 12/16/21 1357  INR 1.7*    Cardiac Enzymes: No results for input(s): CKTOTAL, CKMB, CKMBINDEX, TROPONINI in the last 168 hours.  HbA1C: No results found for: HGBA1C  CBG: No results for input(s): GLUCAP in the last 168 hours.  Review of Systems:   Patient unable to provide reliable history  Past Medical  History  She,  has a past medical history of Cancer associated pain, Colon cancer ([place]), Family history of pancreatic cancer, and Family history of uterine cancer.   Surgical History    Past Surgical History:  Procedure Laterality Date   COLONOSCOPY WITH PROPOFOL N/A 05/27/2020   Procedure: COLONOSCOPY WITH PROPOFOL;  Surgeon: [name], MD;  Location: [facility] ENDOSCOPY;  Service: Gastroenterology;  Laterality: N/A;   FLEXIBLE SIGMOIDOSCOPY N/A 05/28/2020   Procedure: FLEXIBLE SIGMOIDOSCOPY;  Surgeon: [name], MD;  Location: [facility] ENDOSCOPY;  Service: Gastroenterology;  Laterality: N/A;   IR 3D INDEPENDENT WKST  06/09/2021   IR ANGIOGRAM SELECTIVE EACH ADDITIONAL VESSEL  06/09/2021   IR ANGIOGRAM SELECTIVE EACH ADDITIONAL VESSEL  06/09/2021   IR ANGIOGRAM SELECTIVE EACH ADDITIONAL VESSEL  06/09/2021   IR ANGIOGRAM SELECTIVE EACH ADDITIONAL VESSEL  08/18/2021   IR ANGIOGRAM SELECTIVE EACH ADDITIONAL VESSEL  08/18/2021   IR ANGIOGRAM SELECTIVE EACH ADDITIONAL VESSEL  08/18/2021   IR ANGIOGRAM SELECTIVE EACH ADDITIONAL VESSEL  10/20/2021   IR ANGIOGRAM SELECTIVE EACH ADDITIONAL VESSEL  10/20/2021   IR ANGIOGRAM VISCERAL SELECTIVE  06/09/2021   IR ANGIOGRAM VISCERAL SELECTIVE  08/18/2021   IR ANGIOGRAM VISCERAL SELECTIVE  09/01/2021   IR ANGIOGRAM VISCERAL SELECTIVE  10/20/2021   IR EMBO TUMOR ORGAN ISCHEMIA INFARCT INC GUIDE ROADMAPPING  09/01/2021   IR EMBO TUMOR ORGAN ISCHEMIA INFARCT INC GUIDE ROADMAPPING  10/20/2021   IR  NEPHROSTOMY EXCHANGE LEFT  07/11/2020   IR NEPHROSTOMY EXCHANGE RIGHT  07/11/2020   IR NEPHROSTOMY PLACEMENT LEFT  05/23/2020   IR NEPHROSTOMY PLACEMENT RIGHT  05/23/2020   IR RADIOLOGIST EVAL & MGMT  04/28/2021   IR URETERAL STENT LEFT NEW ACCESS W/O SEP NEPHROSTOMY CATH  08/15/2020   IR URETERAL STENT RIGHT NEW ACCESS W/O SEP NEPHROSTOMY CATH  08/15/2020   IR US GUIDE VASC ACCESS RIGHT  06/09/2021   IR US GUIDE VASC ACCESS RIGHT  08/18/2021   IR US GUIDE VASC  ACCESS RIGHT  09/01/2021   IR US GUIDE VASC ACCESS RIGHT  10/20/2021   PORTACATH PLACEMENT Left 05/29/2020   Procedure: INSERTION PORT-A-CATH;  Surgeon: [name], MD;  Location: [facility] ORS;  Service: General;  Laterality: Left;    Social History   reports that she has quit smoking. Her smoking use included cigarettes. She has never used smokeless tobacco. She reports that she does not drink alcohol and does not use drugs.   Family History   Her family history includes Cancer in her cousin and paternal aunt; Diabetes in her maternal grandmother; Heart disease in her father; Liver cancer in her maternal uncle; Other in her mother; Pancreatic cancer in her cousin; Pancreatic cancer (age of onset: 41) in her maternal uncle; Stroke in her maternal grandfather and paternal grandfather; Uterine cancer in her mother.   Allergies No Known Allergies   Home Medications  Prior to Admission medications   Medication Sig Start Date End Date Taking? Authorizing Provider  acetaminophen (TYLENOL) 325 MG tablet Take 650 mg by mouth every 6 (six) hours as needed.    [provider]  cyclobenzaprine (FLEXERIL) 10 MG tablet Take 1 tablet (10 mg total) by mouth 3 (three) times daily as needed for muscle spasms. 11/06/21   Hughie Closs, PA-C  HYDROcodone-acetaminophen (NORCO) 5-325 MG tablet Take 1 tablet by mouth every 6 (six) hours as needed for moderate pain. 11/30/21   [name], MD  lidocaine-prilocaine (EMLA) cream Apply 1 application topically as needed. Apply prior to appointment. Cover with plastic wrap. 11/05/20   [name], MD  Loperamide HCl (IMODIUM PO) Take by mouth.    [provider]  loratadine (CLARITIN) 10 MG tablet Take 10 mg by mouth daily.    [provider]  ondansetron (ZOFRAN) 4 MG tablet Take 1 tablet (4 mg total) by mouth every 8 (eight) hours as needed for nausea or vomiting. 11/30/21   [name], MD  pantoprazole (PROTONIX) 40 MG  tablet Take 1 tablet (40 mg total) by mouth 2 (two) times daily. 11/05/20   [name], MD  prochlorperazine (COMPAZINE) 10 MG tablet Take 1 tablet (10 mg total) by mouth every 6 (six) hours as needed (Nausea or vomiting). 06/04/20 06/12/20  [name], MD    Scheduled Meds:  [START ON 12/17/2021] Chlorhexidine Gluconate Cloth  6 each Topical Q0600   [START ON 12/17/2021] midodrine  10 mg Oral TID WC   pantoprazole  40 mg Oral BID   sodium zirconium cyclosilicate  10 g Oral Once   Continuous Infusions:  sodium chloride     Followed by   sodium chloride     [START ON 12/17/2021] ceFEPime (MAXIPIME) IV     lactated ringers     PRN Meds:.acetaminophen, cyclobenzaprine, diphenhydrAMINE, fentaNYL (SUBLIMAZE) injection, HYDROcodone-acetaminophen, loratadine, morphine injection  Active Hospital Problem list   AKI on CKD Anion gap metabolic acidosis Hyponatremia Hyperkalemia Sepsis Bilateral hydronephrosis due  to obstructive uropathy Metastatic colorectal cancer  Assessment & Plan:  Obstructive Uropathy with worsening Bilateral Hydronephrosis  PMHx: Bilateral Hydronephrosis s/p PCNs on 10/21 with bilateral ureteral stents on 08/15/2020 -Patient presenting with abdominal pain generalized weakness, CT abdomen pelvis shows new moderate bilateral hydronephrosis with likely nonfunctional stents -Discussed with urology Dr. Erlene Quan who will evaluate patient for possible stent replacement in the a.m. recommend Foley catheter placement but if with worsening symptoms overnight will require PCNs by IR -Continue IV fluids hydration -Keep NPO for possible stent exchange in am -Strict I's and O's -Urology consult   AKI on CKD Stage III Hyponatremia likely due to severe dehydration from poor po intake Hyperkalemia~CORRECTED Anion gap metabolic acidosis -Worsening AKI likely due to obstructive uropathy as above and severe dehydration -Monitor I&O's / urinary output -Follow BMP -Check  Serum osmolality, TSH, Cortisol level -Ensure adequate renal perfusion -Avoid nephrotoxic agents as able -Replace electrolytes as indicated -Nephrology consult  Sepsis secondary to UTI Meets SIRS Criteria WBC 43.9, heart rate 105, RR 23.  -Monitor fever curve -Trend WBC's & Procalcitonin -Follow  blood and urine culture -IVF hydration as needed -Pressors for MAP goal >65 -Continue empiric Ceftriaxone pending cultures   Metastatic colorectal cancer S/p chemo and radiation therapy -CT abdomen pelvis with progressive disease process -As needed pain management -Follows with oncologist Dr. Grayland Ormond  Best practice:  Diet:  NPO Pain/Anxiety/Delirium protocol (if indicated): No VAP protocol (if indicated): Not indicated DVT prophylaxis: Contraindicated GI prophylaxis: PPI Glucose control:  SSI No Central venous access:  Yes, and it is still needed Arterial line:  N/A Foley:  Yes, and it is still needed Mobility:  bed rest  PT consulted: N/A Last date of multidisciplinary goals of care discussion [5/17] Code Status:  full code Disposition: Stepdown   = Goals of Care = Code Status Order: FULL  Primary Emergency Contact: Golz,[place], Home Phone: [phone] Wishes to pursue full aggressive treatment and intervention options, including CPR and intubation, but goals of care will be addressed on going with family if that should become necessary.  Critical care time: 45 minutes     Rufina Falco, DNP, CCRN, FNP-C, AGACNP-BC Acute Care Nurse Practitioner  [facility] Pager: [phone] [place] at [facility]

## 2021-12-16 NOTE — ED Notes (Signed)
Pt at CT

## 2021-12-16 NOTE — Assessment & Plan Note (Signed)
Possibly due to progressive colon cancer ?-Supportive care: As needed morphine and Norco for pain ?

## 2021-12-16 NOTE — ED Provider Notes (Signed)
? ?[facility] ?Provider Note ? ? ? Event Date/Time  ? First MD Initiated Contact with Patient 12/16/21 1248   ?  (approximate) ? ? ?History  ? ?Abdominal Pain and Weakness ? ? ?HPI ? ?Anita Sullivan is a 56 y.o. female with history of metastatic stage IV colorectal cancer just completing course of radiation therapy presents to the ER for generalized weakness as well as abdominal pain is not had anything to eat for the past 5 days.  Denies any measured fevers or temperature.  States she having frequent cough and it hurts diffusely whenever she does does not feel like her abdomen is distended. ?  ? ? ?Physical Exam  ? ?Triage Vital Signs: ?ED Triage Vitals  ?Enc Vitals Group  ?   BP 12/16/21 1239 93/78  ?   Pulse Rate 12/16/21 1239 (!) 104  ?   Resp 12/16/21 1239 (!) 22  ?   Temp 12/16/21 1239 (!) 97.5 ?F (36.4 ?C)  ?   Temp Source 12/16/21 1239 Oral  ?   SpO2 12/16/21 1239 95 %  ?   Weight --   ?   Height --   ?   Head Circumference --   ?   Peak Flow --   ?   Pain Score 12/16/21 1240 7  ?   Pain Loc --   ?   Pain Edu? --   ?   Excl. in [place]? --   ? ? ?Most recent vital signs: ?Vitals:  ? 12/16/21 1330 12/16/21 1418  ?BP: 103/73 103/70  ?Pulse: 93 93  ?Resp: 20 (!) 23  ?Temp:    ?SpO2: 98% 99%  ? ? ? ?Constitutional: Alert, frail and weak appearing ?Eyes: Conjunctivae are normal.  ?Head: Atraumatic. ?Nose: No congestion/rhinnorhea. ?Mouth/Throat: Mucous membranes are moist.   ?Neck: Painless ROM.  ?Cardiovascular:   Good peripheral circulation. No m/g/r ?Respiratory: Normal respiratory effort.  No retractions. No wheeze ?Gastrointestinal: Soft and nontender.  ?Musculoskeletal:  no deformity ?Neurologic:  MAE spontaneously. No gross focal neurologic deficits are appreciated.  ?Skin:  Skin is warm, dry and intact. No rash noted. ?Psychiatric: Mood and affect are normal. Speech and behavior are normal. ? ? ? ?ED Results / Procedures / Treatments  ? ?Labs ?(all labs ordered are listed, but only  abnormal results are displayed) ?Labs Reviewed  ?CBC WITH DIFFERENTIAL/PLATELET - Abnormal; Notable for the following components:  ?    Result Value  ? WBC 43.9 (*)   ? Hemoglobin 11.6 (*)   ? RDW 18.6 (*)   ? Neutro Abs 40.7 (*)   ? Monocytes Absolute 1.1 (*)   ? Basophils Absolute 0.2 (*)   ? Abs Immature Granulocytes 1.21 (*)   ? All other components within normal limits  ?PROTIME-INR - Abnormal; Notable for the following components:  ? Prothrombin Time 19.6 (*)   ? INR 1.7 (*)   ? All other components within normal limits  ?COMPREHENSIVE METABOLIC PANEL - Abnormal; Notable for the following components:  ? Sodium 129 (*)   ? Potassium 5.2 (*)   ? CO2 10 (*)   ? Creatinine, Ser 7.48 (*)   ? Calcium 7.5 (*)   ? Albumin 2.1 (*)   ? Alkaline Phosphatase 164 (*)   ? Total Bilirubin 1.6 (*)   ? GFR, Estimated 6 (*)   ? Anion gap 17 (*)   ? All other components within normal limits  ?CULTURE, BLOOD (ROUTINE X 2)  ?CULTURE, BLOOD (  ROUTINE X 2)  ?LACTIC ACID, PLASMA  ?LACTIC ACID, PLASMA  ?URINALYSIS, ROUTINE W REFLEX MICROSCOPIC  ?POC URINE PREG, ED  ? ? ? ?EKG ? ?ED ECG REPORT ?I, Merlyn Lot, the attending physician, personally viewed and interpreted this ECG. ? Date: 12/16/2021 ? EKG Time: 12:54 ? Rate: 120 ? Rhythm: sinus ? Axis: normal ? Intervals: normal ? ST&T Change: no stemi, limited 2/2 motion artifact ? ? ? ?RADIOLOGY ?Please see ED Course for my review and interpretation. ? ?I personally reviewed all radiographic images ordered to evaluate for the above acute complaints and reviewed radiology reports and findings.  These findings were personally discussed with the patient.  Please see medical record for radiology report. ? ? ? ?PROCEDURES: ? ?Critical Care performed: Yes, see critical care procedure note(s) ? ?.Critical Care ?Performed by: Merlyn Lot, MD ?Authorized by: Merlyn Lot, MD  ? ?Critical care provider statement:  ?  Critical care time (minutes):  40 ?  Critical care was necessary  to treat or prevent imminent or life-threatening deterioration of the following conditions:  Renal failure ?  Critical care was time spent personally by me on the following activities:  Ordering and performing treatments and interventions, ordering and review of laboratory studies, ordering and review of radiographic studies, pulse oximetry, re-evaluation of patient's condition, review of old charts, obtaining history from patient or surrogate, examination of patient, evaluation of patient's response to treatment, discussions with primary provider, discussions with consultants and development of treatment plan with patient or surrogate ? ? ?MEDICATIONS ORDERED IN ED: ?Medications  ?fentaNYL (SUBLIMAZE) injection 50 mcg (has no administration in time range)  ?lactated ringers infusion (has no administration in time range)  ?ceFEPIme (MAXIPIME) 2 g in sodium chloride 0.9 % 100 mL IVPB (has no administration in time range)  ?sodium chloride 0.9 % bolus 1,000 mL (1,000 mLs Intravenous Bolus 12/16/21 1526)  ?sodium chloride 0.9 % bolus 1,000 mL (1,000 mLs Intravenous Bolus 12/16/21 1526)  ? ? ? ?IMPRESSION / MDM / ASSESSMENT AND PLAN / ED COURSE  ?I reviewed the triage vital signs and the nursing notes. ?             ?               ? ?Differential diagnosis includes, but is not limited to, dehydration, electrolyte abnormality, anemia, sepsis, perforation, SBO, stone ? ?Patient presented to the ER for evaluation of symptoms as described above.  Does appear dehydrated after reporting poor p.o. intake for the past week.  Will give IV fluids.  Will order CT imaging.  Patient not febrile.  This presenting complaint could reflect a potentially life-threatening illness therefore the patient will be placed on continuous pulse oximetry and telemetry for monitoring.  Laboratory evaluation will be sent to evaluate for the above complaints.   ? ? ? ?Clinical Course as of 12/16/21 1546  ?Wed Dec 16, 2021  ?1318 Chest x-ray on my  interpretation does not show any evidence of infiltrate or consolidation. [PR]  ?[place] lab due to delay in results of CMP.  Does appear the patient is in acute renal failure creatinine is reportedly 7.  Potassium 5.2. [PR]  ?18 CT imaging on my interpretation shows bilateral ureteral stents but with persistent hydro will await formal radiology report.  Leukocytosis will guide and start IV antibiotics possible urological source.  Hospitalist consulted for admission. [PR]  ?  ?Clinical Course User Index ?[PR] Merlyn Lot, MD  ? ? ? ?FINAL CLINICAL IMPRESSION(S) /  ED DIAGNOSES  ? ?Final diagnoses:  ?Acute renal failure, unspecified acute renal failure type ([place])  ? ? ? ?Rx / DC Orders  ? ?ED Discharge Orders   ? ? None  ? ?  ? ? ? ?Note:  This document was prepared using Dragon voice recognition software and may include unintentional dictation errors. ? ?  ?Merlyn Lot, MD ?12/16/21 1546 ? ?

## 2021-12-16 NOTE — ED Triage Notes (Signed)
Pt comes pov from cancer center. Pt hasn't eaten or drank in 5 days per pt. Feels like she's going to pass out.  Had some labs drawn at cancer center but havent resulted. Had port accessed at cancer center but not giving blood back at this time. Pt is pale, clammy, slow to respond.  ?

## 2021-12-16 NOTE — Assessment & Plan Note (Signed)
S/p of chemo and radiation therapy. ?-Follow-up with Dr. Grayland Ormond ?

## 2021-12-16 NOTE — Code Documentation (Signed)
CODE SEPSIS - PHARMACY COMMUNICATION ? ?**Broad Spectrum Antibiotics should be administered within 1 hour of Sepsis diagnosis** ? ?Time Code Sepsis Called/Page Received: 4599 ? ?Antibiotics Ordered: Cefepime ? ?Time of 1st antibiotic administration: 7741 ? ? ?Darrick Penna ,PharmD ?Clinical Pharmacist  ?12/16/2021  3:51 PM ? ?

## 2021-12-16 NOTE — Assessment & Plan Note (Signed)
Pt meets criteria for SIRS with WBC 43.9, heart rate 105, RR 23.  Patient may have urine infection, but patient is so dehydrated, cannot provide urine for urinalysis now. No other source of infection is identified now.  Will treat patient as SIRS now.  If infection source is identified, will change to sepsis.  Lactic acid is normal 1.7. ?-Cefepime empirically ?-Blood culture, urine culture ?-Check procalcitonin level ?-IV fluid ?-f/u Bx and Ux and UA ? ?

## 2021-12-16 NOTE — ED Notes (Signed)
Pt refusing lokelma at this time due to ABD pain and nausea.  ?

## 2021-12-16 NOTE — ED Notes (Signed)
Lab at bedside

## 2021-12-16 NOTE — Procedures (Signed)
Central Venous Catheter Insertion Procedure Note  Anita Sullivan  [id number]  05-10-1966  Date:12/16/21  Time:9:06 PM   Provider Performing:Anita Sullivan   Procedure: Insertion of Non-tunneled Central Venous [phone]) with US guidance ([id number])   Indication(s) Medication administration and Difficult access  Consent Risks of the procedure as well as the alternatives and risks of each were explained to the patient and/or caregiver.  Consent for the procedure was obtained and is signed in the bedside chart  Anesthesia Topical only with 1% lidocaine   Timeout Verified patient identification, verified procedure, site/side was marked, verified correct patient position, special equipment/implants available, medications/allergies/relevant history reviewed, required imaging and test results available.  Sterile Technique Maximal sterile technique including full sterile barrier drape, hand hygiene, sterile gown, sterile gloves, mask, hair covering, sterile ultrasound probe cover (if used).  Procedure Description Area of catheter insertion was cleaned with chlorhexidine and draped in sterile fashion.  With real-time ultrasound guidance a central venous catheter was placed into the right internal jugular vein. Nonpulsatile blood flow and easy flushing noted in all ports.  The catheter was sutured in place and sterile dressing applied.  Complications/Tolerance None; patient tolerated the procedure well. Chest X-ray is ordered to verify placement for internal jugular or subclavian cannulation.   Chest x-ray is not ordered for femoral cannulation.  EBL Minimal  Specimen(s) None   Anita Falco, DNP, CCRN, FNP-C, AGACNP-BC Acute Care & Family Nurse Practitioner  [facility]  See Amion for personal pager PCCM on call pager [phone] until 7 am

## 2021-12-16 NOTE — Consult Note (Signed)
Pharmacy Antibiotic Note ? ?Anita Sullivan is a 56 y.o. female with medical history including stage IV metastatic colorectal cancer who recently completed radiation therapy admitted on 12/16/2021 with AKI in setting of poor PO intake. There is also concern for sepsis possibly from urinary source. Pharmacy has been consulted for cefepime dosing. ? ?Plan: ? ?Cefepime 2 g IV q24h for now based on significant AKI. Will continue to assess renal function and adjust as appropriate ? ?Temp (24hrs), Avg:96.8 ?F (36 ?C), Min:96.1 ?F (35.6 ?C), Max:97.5 ?F (36.4 ?C) ? ?Recent Labs  ?Lab 12/16/21 ?1320 12/16/21 ?1357 12/16/21 ?1402  ?WBC 43.9*  --   --   ?CREATININE  --   --  7.48*  ?LATICACIDVEN  --  1.7  --   ?  ?CrCl cannot be calculated (Unknown ideal weight.).   ? ?No Known Allergies ? ?Antimicrobials this admission: ?Cefepime 5/17 >>  ? ?Dose adjustments this admission: ?N/A ? ?Microbiology results: ?5/27 BCx: pending ?5/17 UCx: pending  ?5/17 MRSA PCR: pending ? ?Thank you for allowing pharmacy to be a part of this patient?s care. ? ?Benita Gutter ?12/16/2021 7:06 PM ? ?

## 2021-12-16 NOTE — Progress Notes (Signed)
1050 pt in w/c, here for portflush and lab draw and see MD. When getting ready to access port pt request to lay down she doesn't feel well, pt is pale, poor skin tugor and very weak, pt taken to infusion chair 18, assisted to chair, port accessed but no blood return, flushes well, no swelling or discomfort noted, S. Covington NP here at chair side to see pt, NS started at 999cc/hr, discussed possibility of pt having to go to emergency room for further tx and assessment. ?

## 2021-12-16 NOTE — ED Notes (Signed)
This RN attempted to obtain blood from port that was accessed by cancer center PTA. After multiple attempts and various maneuvers this RN was not able to obtain any blood from accessed port but it does flush without issue and appears patent from that standpoint. PIV also attempted with no success, MD aware and charge RN asked to assist with this.  ?

## 2021-12-16 NOTE — Progress Notes (Signed)
Brief urology consult ? 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  Bilateral percutaneous nephrostomy tubes were placed back in 2021, later internalized but unfortunately, she has not been seen or evaluated by urology nor have the nephrostomy tubes b een exchanged.  CT scan indicates some possible calcifications within the bladder and the distal coil but the remainder of the stents do not appear particularly encrusted. Unclear whether or not hydronephrosis related to progression of disease with stent failure or stent encrustation.  Presenting with SIRS criteria, leukocytosis, afebrile but with soft blood pressures admitted to the ICU for observation. ? ?I recommended the followi ng in the short-term: ?? Supportive care with IV antibiotics in aggressive fluid resuscitation ?? Foley catheter placement for maximal urinary decompression as well as monitoring of urine output ?? N.p.o. at midnight for consideration of the exchange in the morning, depending on clinical status, renal function, and urine output ?-If patient becomes unstable overnight, potassium, worsens, etc., would recommend emergent percutaneo [place] nephrostomy tube placement, as unclear whether or not stent exchange would be successful, and/or possible without significant manipulation of the encrusted stent which is ill-advised in the setting of sepsis along with the efficacy of doing so if obstruction is secondary to stent failure from disease progression.   ?-We will see the patient in the a.m. to establish a plan for her current stents ? ? ?Recommendations were discu ssed with covering ICU provider. ?

## 2021-12-16 NOTE — Progress Notes (Addendum)
[facility] consulted to obtain PIV access. Pt has a left chest port which is infusing, but won't draw back blood. Spoke with patient who stated they have had trouble with getting blood return since it was placed. Most recent chest x-ray shows catheter may be located in azygous vein. Radiology suggested another view to confirm actual tip placement.  ?Assessed bilateral arms with ultrasound; unable to locate any appropriate vessels for placement of USGIV. ER RN notified.  ? ?Lateral chest x-ray ordered to confirm port-a-cath tip placement; radiologist confirmed "It is directed slightly posteriorly but this is within the SVC on the chest CT and not in the azygous.  ? ? ?

## 2021-12-16 NOTE — H&P (Addendum)
?History and Physical  ? ? ?Anita Sullivan KKX:[id number] DOB: 12/30/1965 DOA: 12/16/2021 ? ?Referring MD/NP/PA:  ? ?PCP: [name], NP  ? ?Patient coming from:  The patient is coming from home.   ? ? ?Chief Complaint: weakness and abdominal pain ? ?HPI: Anita Sullivan is a 56 y.o. female with medical history significant of obstructive uropathy and hydronephrosis (s/p of bilateral ureteral stent placement), metastasized colon cancer, CKD-3A, GERD, GI bleeding, who presents with abdominal pain, weakness. ? ?Patient has generalized weakness recently.  She has very poor appetite, did not eat much in the past 5 days.  She has nausea and dry heaves.  Patient has diffuse abdominal pain, right side is worse than the left.  The abdominal pain is constant, sharp, 10 out of 10 in severity, nonradiating.  No fever or chills.  Patient has cough with clear mucus production, and mild shortness of breath, no chest pain.  Denies symptoms of UTI.  Patient was seen in Dr. Gary Fleet office today in cancer center and was sent to ED for further evaluation and treatment. ? ?Data Reviewed and ED Course: pt was found to have significantly worsening renal function with creatinine up from 1.43 to 7.49, WBC 43.9, lactic acid 1.7, INR 1.7, potassium 5.2, sodium 129, temperature 97.5, soft blood pressure with SBP 80s-90s, heart rate 105, RR 23, oxygen saturation 99% on room air.  CT scan of abdomen/pelvis showed progressive colon cancer and new moderate bilateral hydronephrosis attributed to prominent ureteral stent encrustation in the bladder. The stents are likely nonfunctional, and may need replacement. Pt is admitted to [facility] as inpt. Dr. Erlene Quan of urology and Dr. Holley Raring of renal and Dr. Mortimer Fries and Stark Klein of ICU are consulted. ? ?First CXR is negative for infiltration, but showed that left IJ Port-A-Cath tip may be directed posteriorly within the azygous vein.  The repeated CXR-lateral view showed that left IJ Port-A-Cath in  stable position. Its tip is in the SVC based on the prior chest CT. ? ?CT-abd/pelvis: ?1. Progressive pelvic soft tissue nodularity and nodular mural ?thickening of the rectosigmoid colon consistent with progressive ?local malignancy (colon cancer). ?2. 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 ?3. New moderate bilateral hydronephrosis attributed to prominent ?ureteral stent encrustation in the bladder. The stents are likely ?nonfunctional, and replacement should be considered. ?4. Known hepatic metastatic disease is not optimally visualized, but ?does not appear significantly progressive. ?5. New nodularity at the left lung base, suspicious for progressive ?metastatic disease. ? ? ?EKG: I have personally reviewed.  Sinus rhythm, QTc 544, poor R wave progression, poor EKG strip quality. ? ?Review of Systems:  ? ?General: no fevers, chills, no body weight gain, has poor appetite, has fatigue ?HEENT: no blurry vision, hearing changes or sore throat ?Respiratory: no dyspnea, coughing, wheezing ?CV: no chest pain, no palpitations ?GI: has nausea, vomiting, abdominal pain, no diarrhea, constipation ?GU: no dysuria, burning on urination, increased urinary frequency, hematuria  ?Ext: no leg edema ?Neuro: no unilateral weakness, numbness, or tingling, no vision change or hearing loss ?Skin: no rash, no skin tear. ?MSK: No muscle spasm, no deformity, no limitation of range of movement in spin ?Heme: No easy bruising.  ?Travel history: No recent long distant travel. ? ? ?Allergy: No Known Allergies ? ?Past Medical History:  ?Diagnosis Date  ? Cancer associated pain   ? Colon cancer ([place])   ? Family history of pancreatic cancer   ? Family history of uterine  cancer   ? ? ?Past Surgical History:  ?Procedure Laterality Date  ? COLONOSCOPY WITH PROPOFOL N/A 05/27/2020  ? Procedure: COLONOSCOPY WITH PROPOFOL;  Surgeon: [name], MD;  Location: [facility] ENDOSCOPY;  Service:  Gastroenterology;  Laterality: N/A;  ? FLEXIBLE SIGMOIDOSCOPY N/A 05/28/2020  ? Procedure: FLEXIBLE SIGMOIDOSCOPY;  Surgeon: [name], MD;  Location: [facility] ENDOSCOPY;  Service: Gastroenterology;  Laterality: N/A;  ? IR 3D INDEPENDENT WKST  06/09/2021  ? IR ANGIOGRAM SELECTIVE EACH ADDITIONAL VESSEL  06/09/2021  ? IR ANGIOGRAM SELECTIVE EACH ADDITIONAL VESSEL  06/09/2021  ? IR ANGIOGRAM SELECTIVE EACH ADDITIONAL VESSEL  06/09/2021  ? IR ANGIOGRAM SELECTIVE EACH ADDITIONAL VESSEL  08/18/2021  ? IR ANGIOGRAM SELECTIVE EACH ADDITIONAL VESSEL  08/18/2021  ? IR ANGIOGRAM SELECTIVE EACH ADDITIONAL VESSEL  08/18/2021  ? IR ANGIOGRAM SELECTIVE EACH ADDITIONAL VESSEL  10/20/2021  ? IR ANGIOGRAM SELECTIVE EACH ADDITIONAL VESSEL  10/20/2021  ? IR ANGIOGRAM VISCERAL SELECTIVE  06/09/2021  ? IR ANGIOGRAM VISCERAL SELECTIVE  08/18/2021  ? IR ANGIOGRAM VISCERAL SELECTIVE  09/01/2021  ? IR ANGIOGRAM VISCERAL SELECTIVE  10/20/2021  ? IR EMBO TUMOR ORGAN ISCHEMIA INFARCT INC GUIDE ROADMAPPING  09/01/2021  ? IR EMBO TUMOR ORGAN ISCHEMIA INFARCT INC GUIDE ROADMAPPING  10/20/2021  ? IR NEPHROSTOMY EXCHANGE LEFT  07/11/2020  ? IR NEPHROSTOMY EXCHANGE RIGHT  07/11/2020  ? IR NEPHROSTOMY PLACEMENT LEFT  05/23/2020  ? IR NEPHROSTOMY PLACEMENT RIGHT  05/23/2020  ? IR RADIOLOGIST EVAL & MGMT  04/28/2021  ? IR URETERAL STENT LEFT NEW ACCESS W/O SEP NEPHROSTOMY CATH  08/15/2020  ? IR URETERAL STENT RIGHT NEW ACCESS W/O SEP NEPHROSTOMY CATH  08/15/2020  ? IR US GUIDE VASC ACCESS RIGHT  06/09/2021  ? IR US GUIDE VASC ACCESS RIGHT  08/18/2021  ? IR US GUIDE VASC ACCESS RIGHT  09/01/2021  ? IR US GUIDE VASC ACCESS RIGHT  10/20/2021  ? PORTACATH PLACEMENT Left 05/29/2020  ? Procedure: INSERTION PORT-A-CATH;  Surgeon: [name], MD;  Location: [facility] ORS;  Service: General;  Laterality: Left;  ? ? ?Social History:  reports that she has quit smoking. Her smoking use included cigarettes. She has never used smokeless tobacco. She reports that she does not drink  alcohol and does not use drugs. ? ?Family History:  ?Family History  ?Problem Relation Age of Onset  ? Other Mother   ?     Corticobasal degeneration  ? Uterine cancer Mother   ? Diabetes Maternal Grandmother   ? Stroke Maternal Grandfather   ? Stroke Paternal Grandfather   ? Heart disease Father   ? Pancreatic cancer Maternal Uncle 10  ? Cancer Paternal Aunt   ?     unk type  ? Liver cancer Maternal Uncle   ? Pancreatic cancer Cousin   ? Cancer Cousin   ?     unk type  ?  ? ?Prior to Admission medications   ?Medication Sig Start Date End Date Taking? Authorizing Provider  ?acetaminophen (TYLENOL) 325 MG tablet Take 650 mg by mouth every 6 (six) hours as needed.    [provider]  ?cyclobenzaprine (FLEXERIL) 10 MG tablet Take 1 tablet (10 mg total) by mouth 3 (three) times daily as needed for muscle spasms. 11/06/21   Hughie Closs, PA-C  ?HYDROcodone-acetaminophen (NORCO) 5-325 MG tablet Take 1 tablet by mouth every 6 (six) hours as needed for moderate pain. 11/30/21   [name], MD  ?lidocaine-prilocaine (EMLA) cream Apply 1 application topically as needed. Apply prior  to appointment. Cover with plastic wrap. 11/05/20   [name], MD  ?Loperamide HCl (IMODIUM PO) Take by mouth.    [provider]  ?loratadine (CLARITIN) 10 MG tablet Take 10 mg by mouth daily.    [provider]  ?ondansetron (ZOFRAN) 4 MG tablet Take 1 tablet (4 mg total) by mouth every 8 (eight) hours as needed for nausea or vomiting. 11/30/21   [name], MD  ?pantoprazole (PROTONIX) 40 MG tablet Take 1 tablet (40 mg total) by mouth 2 (two) times daily. 11/05/20   [name], MD  ?prochlorperazine (COMPAZINE) 10 MG tablet Take 1 tablet (10 mg total) by mouth every 6 (six) hours as needed (Nausea or vomiting). 06/04/20 06/12/20  [name], MD  ? ? ?Physical Exam: ?Vitals:  ? 12/16/21 1330 12/16/21 1418 12/16/21 1600 12/16/21 1905  ?BP: 103/73 103/70 102/68 106/69  ?Pulse: 93  93 86 90  ?Resp: 20 (!) 23 (!) 23 (!) 22  ?Temp:    (!) 97.5 ?F (36.4 ?C)  ?TempSrc:    Axillary  ?SpO2: 98% 99% 99% 98%  ?Weight:    55.4 kg  ?Height:    '5\' 3"'$  (1.6 m)  ? ?General: Not in acute distress. Pal

## 2021-12-16 NOTE — ED Notes (Signed)
Pt presents to ED with c/o of weakness and "not feeling good". Pt states she has liver CA was going to the cancer center today to have her port flushed. Pt states she then told the staff at cancer center that she has not eaten in 5 days due to no appetite, pt does report she has been having some fluid intake but "not as much as I hope for".  ? ?Pt c/o of ABD pain at this time. Pt denies N/V/D. Pt denies fevers and chills.  ?

## 2021-12-16 NOTE — ED Notes (Signed)
This RN and charge RN attempted to get blood but only a CBC and CMP could be collected due to difficulty, lab called to assist with rest.  ?

## 2021-12-17 ENCOUNTER — Inpatient Hospital Stay: Payer: Medicaid Other | Admitting: Radiology

## 2021-12-17 DIAGNOSIS — R627 Adult failure to thrive: Secondary | ICD-10-CM | POA: Diagnosis not present

## 2021-12-17 DIAGNOSIS — N139 Obstructive and reflux uropathy, unspecified: Secondary | ICD-10-CM

## 2021-12-17 DIAGNOSIS — A419 Sepsis, unspecified organism: Secondary | ICD-10-CM | POA: Diagnosis not present

## 2021-12-17 DIAGNOSIS — R7881 Bacteremia: Secondary | ICD-10-CM

## 2021-12-17 DIAGNOSIS — N1831 Chronic kidney disease, stage 3a: Secondary | ICD-10-CM | POA: Diagnosis not present

## 2021-12-17 DIAGNOSIS — N179 Acute kidney failure, unspecified: Secondary | ICD-10-CM | POA: Diagnosis not present

## 2021-12-17 DIAGNOSIS — C189 Malignant neoplasm of colon, unspecified: Secondary | ICD-10-CM | POA: Diagnosis not present

## 2021-12-17 DIAGNOSIS — T83192A Other mechanical complication of urinary stent, initial encounter: Secondary | ICD-10-CM

## 2021-12-17 DIAGNOSIS — N1330 Unspecified hydronephrosis: Secondary | ICD-10-CM | POA: Diagnosis not present

## 2021-12-17 DIAGNOSIS — E43 Unspecified severe protein-calorie malnutrition: Secondary | ICD-10-CM | POA: Insufficient documentation

## 2021-12-17 HISTORY — PX: IR NEPHROSTOMY PLACEMENT LEFT: IMG6063

## 2021-12-17 HISTORY — PX: IR NEPHROSTOMY PLACEMENT RIGHT: IMG6064

## 2021-12-17 LAB — BASIC METABOLIC PANEL
Anion gap: 13 (ref 5–15)
BUN: 111 mg/dL — ABNORMAL HIGH (ref 6–20)
CO2: 11 mmol/L — ABNORMAL LOW (ref 22–32)
Calcium: 7.1 mg/dL — ABNORMAL LOW (ref 8.9–10.3)
Chloride: 109 mmol/L (ref 98–111)
Creatinine, Ser: 7.12 mg/dL — ABNORMAL HIGH (ref 0.44–1.00)
GFR, Estimated: 6 mL/min — ABNORMAL LOW (ref >60)
Glucose, Bld: 77 mg/dL (ref 70–99)
Potassium: 4.7 mmol/L (ref 3.5–5.1)
Sodium: 133 mmol/L — ABNORMAL LOW (ref 135–145)

## 2021-12-17 LAB — BLOOD CULTURE ID PANEL (REFLEXED) - BCID2

## 2021-12-17 LAB — URINALYSIS, ROUTINE W REFLEX MICROSCOPIC
Bilirubin Urine: NEGATIVE
Bilirubin Urine: NEGATIVE
Glucose, UA: NEGATIVE mg/dL
Glucose, UA: NEGATIVE mg/dL
Ketones, ur: NEGATIVE mg/dL
Ketones, ur: NEGATIVE mg/dL
Nitrite: NEGATIVE
Nitrite: NEGATIVE
Protein, ur: 30 mg/dL — AB
Protein, ur: >=300 mg/dL — AB
RBC / HPF: >50 RBC/hpf — ABNORMAL HIGH (ref 0–5)
RBC / HPF: >50 RBC/hpf — ABNORMAL HIGH (ref 0–5)
Specific Gravity, Urine: 1.005 (ref 1.005–1.030)
Specific Gravity, Urine: 1.013 (ref 1.005–1.030)
Squamous Epithelial / HPF: NONE SEEN (ref 0–5)
WBC, UA: >50 WBC/hpf — ABNORMAL HIGH (ref 0–5)
WBC, UA: >50 WBC/hpf — ABNORMAL HIGH (ref 0–5)
pH: 7 (ref 5.0–8.0)
pH: 8 (ref 5.0–8.0)

## 2021-12-17 LAB — GLUCOSE, CAPILLARY
Glucose-Capillary: 67 mg/dL — ABNORMAL LOW (ref 70–99)
Glucose-Capillary: 155 mg/dL — ABNORMAL HIGH (ref 70–99)
Glucose-Capillary: 84 mg/dL (ref 70–99)
Glucose-Capillary: 100 mg/dL — ABNORMAL HIGH (ref 70–99)
Glucose-Capillary: 84 mg/dL (ref 70–99)

## 2021-12-17 LAB — TYPE AND SCREEN
ABO/RH(D): A NEG
Antibody Screen: NEGATIVE
Unit division: 0
Unit division: 0
Unit division: 0
Unit division: 0

## 2021-12-17 LAB — HIV ANTIBODY (ROUTINE TESTING W REFLEX): HIV Screen 4th Generation wRfx: NONREACTIVE

## 2021-12-17 LAB — TSH: TSH: 1.066 u[IU]/mL (ref 0.350–4.500)

## 2021-12-17 LAB — CBC
HCT: 27.4 % — ABNORMAL LOW (ref 36.0–46.0)
Hemoglobin: 8.7 g/dL — ABNORMAL LOW (ref 12.0–15.0)
MCH: 29.8 pg (ref 26.0–34.0)
MCHC: 31.8 g/dL (ref 30.0–36.0)
MCV: 93.8 fL (ref 80.0–100.0)
Platelets: 166 10*3/uL (ref 150–400)
RBC: 2.92 MIL/uL — ABNORMAL LOW (ref 3.87–5.11)
RDW: 18.5 % — ABNORMAL HIGH (ref 11.5–15.5)
WBC: 38 10*3/uL — ABNORMAL HIGH (ref 4.0–10.5)
nRBC: 0 % (ref 0.0–0.2)

## 2021-12-17 LAB — BPAM RBC
Blood Product Expiration Date: 202305242359
Blood Product Expiration Date: 202306012359
Blood Product Expiration Date: 202306022359
Blood Product Expiration Date: 202306022359
ISSUE DATE / TIME: 202305182121
Unit Type and Rh: 600
Unit Type and Rh: 600
Unit Type and Rh: 600
Unit Type and Rh: 600

## 2021-12-17 LAB — HEMOGLOBIN AND HEMATOCRIT, BLOOD
HCT: 21.2 % — ABNORMAL LOW (ref 36.0–46.0)
Hemoglobin: 6.9 g/dL — ABNORMAL LOW (ref 12.0–15.0)

## 2021-12-17 LAB — HEMOGLOBIN: Hemoglobin: 8.3 g/dL — ABNORMAL LOW (ref 12.0–15.0)

## 2021-12-17 LAB — OSMOLALITY: Osmolality: 321 mOsm/kg (ref 275–295)

## 2021-12-17 LAB — MAGNESIUM: Magnesium: 2.7 mg/dL — ABNORMAL HIGH (ref 1.7–2.4)

## 2021-12-17 LAB — PREPARE RBC (CROSSMATCH)

## 2021-12-17 LAB — CORTISOL-AM, BLOOD: Cortisol - AM: 46.6 ug/dL — ABNORMAL HIGH (ref 6.7–22.6)

## 2021-12-17 MED ORDER — IPRATROPIUM-ALBUTEROL 0.5-2.5 (3) MG/3ML IN SOLN
3.0000 mL | Freq: Four times a day (QID) | RESPIRATORY_TRACT | Status: DC
  Filled 2021-12-17 (×2): qty 3

## 2021-12-17 MED ORDER — MORPHINE SULFATE (PF) 2 MG/ML IV SOLN
1.0000 mg | INTRAVENOUS | Status: DC | PRN
  Administered 2021-12-17: 2 mg via INTRAVENOUS
  Administered 2021-12-18: 1 mg via INTRAVENOUS
  Administered 2021-12-19 – 2021-12-21 (×3): 2 mg via INTRAVENOUS
  Filled 2021-12-17 (×5): qty 1

## 2021-12-17 MED ORDER — LORAZEPAM 2 MG/ML IJ SOLN: 1.0000 mg | INTRAMUSCULAR | Status: DC | PRN

## 2021-12-17 MED ORDER — MIDAZOLAM HCL 2 MG/2ML IJ SOLN
INTRAMUSCULAR | Status: AC
  Filled 2021-12-17: qty 2

## 2021-12-17 MED ORDER — FENTANYL CITRATE (PF) 100 MCG/2ML IJ SOLN
INTRAMUSCULAR | Status: AC | PRN
  Administered 2021-12-17: 25 ug via INTRAVENOUS

## 2021-12-17 MED ORDER — NEPRO/CARBSTEADY PO LIQD
237.0000 mL | Freq: Three times a day (TID) | ORAL | Status: DC
Start: 2021-12-17 — End: 2021-12-17

## 2021-12-17 MED ORDER — IOHEXOL 350 MG/ML SOLN
12.0000 mL | Freq: Once | INTRAVENOUS | Status: AC | PRN
  Administered 2021-12-17: 12 mL

## 2021-12-17 MED ORDER — SODIUM CHLORIDE 0.9% IV SOLUTION: Freq: Once | INTRAVENOUS | Status: AC

## 2021-12-17 MED ORDER — BIOTENE DRY MOUTH MT LIQD: 15.0000 mL | OROMUCOSAL | Status: DC | PRN

## 2021-12-17 MED ORDER — HALOPERIDOL 0.5 MG PO TABS: 0.5000 mg | ORAL_TABLET | ORAL | Status: DC | PRN

## 2021-12-17 MED ORDER — NOREPINEPHRINE 4 MG/250ML-% IV SOLN
INTRAVENOUS | Status: AC
  Administered 2021-12-17: 4 mg
  Filled 2021-12-17: qty 250

## 2021-12-17 MED ORDER — HYDROCORTISONE SOD SUC (PF) 100 MG IJ SOLR
100.0000 mg | Freq: Two times a day (BID) | INTRAMUSCULAR | Status: DC
  Administered 2021-12-17: 100 mg via INTRAVENOUS
  Filled 2021-12-17: qty 2

## 2021-12-17 MED ORDER — SODIUM BICARBONATE 8.4 % IV SOLN
INTRAVENOUS | Status: DC
  Filled 2021-12-17: qty 150
  Filled 2021-12-17: qty 1000

## 2021-12-17 MED ORDER — HALOPERIDOL LACTATE 5 MG/ML IJ SOLN
0.5000 mg | INTRAMUSCULAR | Status: DC | PRN
  Administered 2021-12-19: 0.5 mg via INTRAVENOUS
  Filled 2021-12-17: qty 1

## 2021-12-17 MED ORDER — LACTATED RINGERS IV BOLUS
1000.0000 mL | Freq: Once | INTRAVENOUS | Status: AC
  Administered 2021-12-17: 1000 mL via INTRAVENOUS

## 2021-12-17 MED ORDER — SODIUM CHLORIDE 0.9 % IV SOLN
25.0000 mg | Freq: Four times a day (QID) | INTRAVENOUS | Status: DC | PRN
  Administered 2021-12-17: 25 mg via INTRAVENOUS
  Filled 2021-12-17 (×2): qty 1

## 2021-12-17 MED ORDER — LORAZEPAM 1 MG PO TABS: 1.0000 mg | ORAL_TABLET | ORAL | Status: DC | PRN

## 2021-12-17 MED ORDER — GLYCOPYRROLATE 0.2 MG/ML IJ SOLN
0.2000 mg | INTRAMUSCULAR | Status: DC | PRN
  Filled 2021-12-17: qty 1

## 2021-12-17 MED ORDER — ENSURE ENLIVE PO LIQD
237.0000 mL | Freq: Three times a day (TID) | ORAL | Status: DC
  Administered 2021-12-17 – 2021-12-21 (×9): 237 mL via ORAL

## 2021-12-17 MED ORDER — PHENAZOPYRIDINE HCL 200 MG PO TABS: 200.0000 mg | ORAL_TABLET | Freq: Three times a day (TID) | ORAL | Status: DC

## 2021-12-17 MED ORDER — ADULT MULTIVITAMIN W/MINERALS CH
1.0000 | ORAL_TABLET | Freq: Every day | ORAL | Status: DC
  Administered 2021-12-18 – 2021-12-19 (×2): 1 via ORAL
  Filled 2021-12-17 (×4): qty 1

## 2021-12-17 MED ORDER — HALOPERIDOL LACTATE 2 MG/ML PO CONC: 0.5000 mg | ORAL | Status: DC | PRN

## 2021-12-17 MED ORDER — LORAZEPAM 2 MG/ML PO CONC: 1.0000 mg | ORAL | Status: DC | PRN

## 2021-12-17 MED ORDER — FENTANYL CITRATE (PF) 100 MCG/2ML IJ SOLN
INTRAMUSCULAR | Status: AC
  Filled 2021-12-17: qty 2

## 2021-12-17 MED ORDER — LACTATED RINGERS IV BOLUS
2000.0000 mL | Freq: Once | INTRAVENOUS | Status: AC
  Administered 2021-12-17: 2000 mL via INTRAVENOUS

## 2021-12-17 MED ORDER — LIDOCAINE HCL 1 % IJ SOLN
INTRAMUSCULAR | Status: AC
Start: 2021-12-17 — End: 2021-12-17
  Administered 2021-12-17: 20 mL via SUBCUTANEOUS
  Filled 2021-12-17: qty 20

## 2021-12-17 MED ORDER — DEXTROSE-NACL 5-0.9 % IV SOLN: INTRAVENOUS | Status: DC

## 2021-12-17 MED ORDER — SODIUM CHLORIDE 0.9 % IV SOLN
2.0000 g | INTRAVENOUS | Status: DC
  Administered 2021-12-17 – 2021-12-19 (×3): 2 g via INTRAVENOUS
  Filled 2021-12-17: qty 20
  Filled 2021-12-17: qty 2
  Filled 2021-12-17: qty 20
  Filled 2021-12-17: qty 2

## 2021-12-17 MED ORDER — ONDANSETRON HCL 4 MG/2ML IJ SOLN: 4.0000 mg | Freq: Four times a day (QID) | INTRAMUSCULAR | Status: DC | PRN

## 2021-12-17 MED ORDER — DEXTROSE 50 % IV SOLN
INTRAVENOUS | Status: AC
  Administered 2021-12-17: 50 mL
  Filled 2021-12-17: qty 50

## 2021-12-17 MED ORDER — GLYCOPYRROLATE 0.2 MG/ML IJ SOLN: 0.2000 mg | INTRAMUSCULAR | Status: DC | PRN

## 2021-12-17 MED ORDER — MIDAZOLAM HCL 2 MG/2ML IJ SOLN
INTRAMUSCULAR | Status: AC | PRN
Start: 2021-12-17 — End: 2021-12-17
  Administered 2021-12-17: 1 mg via INTRAVENOUS

## 2021-12-17 MED ORDER — GLYCOPYRROLATE 1 MG PO TABS: 1.0000 mg | ORAL_TABLET | ORAL | Status: DC | PRN

## 2021-12-17 NOTE — Progress Notes (Signed)
Vital signs reviewed, ICU needs resolved   Procedure: Bilateral PCN placement    Findings:  1. Bilateral PCN placement, 10 Fr, bag drains  2. Grossly purulent urine, sample from left kidney sent to lab     Not on pressors, therapy for sepsis ongoing   Will sign off at this time. No further recommendations at this time.    Corrin Parker, M.D.  [facility]  Medical Director [place] Director [facility] Cardio-Pulmonary Department

## 2021-12-17 NOTE — Procedures (Signed)
Interventional Radiology Procedure Note  Date of Procedure: 12/17/2021  Procedure: Bilateral PCN placement   Findings:  1. Bilateral PCN placement, 10 Fr, bag drains  2. Grossly purulent urine, sample from left kidney sent to lab    Complications: No immediate complications noted.   Estimated Blood Loss: minimal  Follow-up and Recommendations: 1. Follow up with IR in 6 weeks for routine drain care    Albin Felling, MD  Vascular & Interventional Radiology  12/17/2021 12:04 PM

## 2021-12-17 NOTE — Progress Notes (Addendum)
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 S/p b/l ureteral stents on 08/15/20 but CT scan shows likely nonfunctional stents. Continue on IVFs. S/p placement of b/l percutaneous nephrostomy tubes 12/17/21 as per IR.   AKI on CKDIIIb: Cr is trending up from day prior. Likely secondary to above. Nephro consulted  Sepsis: met criteria w/ leukocytosis, tachycardia, tachypnea & likely UTI & bacteremia.  Urine cx is pending.   Bacteremia: blood cxs growing e. coli, sens pending. Likely secondary to UTI. Continue on IV rocephin  Metabolic acidosis: started on bicarb drip. Likely secondary to above   Leukocytosis: secondary to above infection. Trending down. Continue on IV abxs   Hyperkalemia: resolved  Hyponatremia: stable from day prior. Will continue to monitor   Colorectal cancer w/ mets: stage IV as per pt's daughter. S/p chemo, radiation. 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 Has been pooping blood out as per pt's daughter. Will continue to monitor H&H  Hyperbilirubinemia: etiology unclear. Will continue to monitor   Severe malnutrition: likely secondary to colorectal cancer. Continue on nutrition supplements   DVT prophylaxis: SCDs Code Status: DNR Family Communication: discussed pt's care w/ pt's daughter, [name], and answered her questions  Disposition Plan: depends on PT/OT recs (not consulted yet)   Level of care: Stepdown  Status is: Inpatient Remains  inpatient appropriate because: severity of illness    Consultants:  Urology IR  Nephro   Procedures:   Antimicrobials: rocephin    Subjective: Pt c/o malaise & being cold   Objective: Vitals:   12/16/21 2334 12/17/21 0412 12/17/21 0700 12/17/21 0800  BP: 102/65 102/66 99/62 103/64  Pulse: 97 100 98   Resp: (!) 22 (!) 21 (!) 23 (!) 22  Temp: 97.7 F (36.5 C) 97.8 F (36.6 C)  (!) 97.5 F (36.4 C)  TempSrc: Oral Oral  Axillary  SpO2: 98% 99% 98%   Weight:      Height:        Intake/Output Summary (Last 24 hours) at 12/17/2021 0825 Last data filed at 12/17/2021 0433 Gross per 24 hour  Intake 422.8 ml  Output 150 ml  Net 272.8 ml   Filed Weights   12/16/21 1905  Weight: 55.4 kg    Examination:  General exam: Appears lethargic & uncomfortable  Respiratory system: decreased breath sounds b/l. Cardiovascular system: S1 & S2 +. No rubs, gallops or clicks.  Gastrointestinal system: Abdomen is nondistended, soft and nontender. Hyperactive bowel sounds heard. Central nervous system: Lethargic. Moves all extremities  Psychiatry: Judgement and insight appears poor. Flat mood and affect     Data Reviewed: I have personally reviewed following labs and imaging studies  CBC: Recent Labs  Lab 12/16/21 1320 12/17/21 0425  WBC 43.9* 38.0*  NEUTROABS 40.7*  --   HGB 11.6* 8.7*  HCT 38.1 27.4*  MCV 97.9 93.8  PLT 215 179   Basic Metabolic Panel: Recent Labs  Lab 12/16/21 1402 12/16/21 2121 12/17/21 0425  NA 129* 133* 133*  K 5.2* 4.5  4.7  CL 102 109 109  CO2 10* 10* 11*  GLUCOSE 96 92 77  BUN QUANTITY NOT SUFFICIENT, UNABLE TO PERFORM TEST 109* 111*  CREATININE 7.48* 6.75* 7.12*  CALCIUM 7.5* 6.9* 7.1*  MG  --   --  2.7*   GFR: Estimated Creatinine Clearance: 7.6 mL/min (A) (by C-G formula based on SCr of 7.12 mg/dL (H)). Liver Function Tests: Recent Labs  Lab 12/16/21 1402  AST 15  ALT 8  ALKPHOS 164*  BILITOT 1.6*  PROT 7.4  ALBUMIN 2.1*    No results for input(s): LIPASE, AMYLASE in the last 168 hours. No results for input(s): AMMONIA in the last 168 hours. Coagulation Profile: Recent Labs  Lab 12/16/21 1357  INR 1.7*   Cardiac Enzymes: No results for input(s): CKTOTAL, CKMB, CKMBINDEX, TROPONINI in the last 168 hours. BNP (last 3 results) No results for input(s): PROBNP in the last 8760 hours. HbA1C: No results for input(s): HGBA1C in the last 72 hours. CBG: Recent Labs  Lab 12/16/21 1902 12/17/21 0800  GLUCAP 72 67*   Lipid Profile: No results for input(s): CHOL, HDL, LDLCALC, TRIG, CHOLHDL, LDLDIRECT in the last 72 hours. Thyroid Function Tests: Recent Labs    12/17/21 0136  TSH 1.066   Anemia Panel: No results for input(s): VITAMINB12, FOLATE, FERRITIN, TIBC, IRON, RETICCTPCT in the last 72 hours. Sepsis Labs: Recent Labs  Lab 12/16/21 1357 12/16/21 2121  PROCALCITON  --  3.90  LATICACIDVEN 1.7 0.9    Recent Results (from the past 240 hour(s))  Culture, blood (Routine x 2)     Status: None (Preliminary result)   Collection Time: 12/16/21  1:57 PM   Specimen: BLOOD  Result Value Ref Range Status   Specimen Description   Final    BLOOD LEFT [facility] Performed at [facility], [address] [place],  [address]    Special Requests   Final    BOTTLES DRAWN AEROBIC AND ANAEROBIC Blood Culture adequate volume Performed at [facility], [address] [place], [place] [address]    Culture  Setup Time   Final    GRAM NEGATIVE RODS IN BOTH AEROBIC AND ANAEROBIC BOTTLES CRITICAL VALUE NOTED.  VALUE IS CONSISTENT WITH PREVIOUSLY REPORTED AND CALLED VALUE. GRAM STAIN REVIEWED-AGREE WITH RESULT Performed at [facility], [place] [address] [place], [place] [address]    Culture GRAM NEGATIVE RODS  Final   Report Status PENDING  Incomplete  Culture, blood (Routine x 2)     Status: None (Preliminary result)   Collection Time: 12/16/21  2:02 PM   Specimen: BLOOD  Result  Value Ref Range Status   Specimen Description   Final    BLOOD RIGHT ANTECUBITAL Performed at [facility], [address] [place], [place] [address]    Special Requests   Final    BOTTLES DRAWN AEROBIC AND ANAEROBIC Blood Culture results may not be optimal due to an inadequate volume of blood received in culture bottles Performed at [facility], [address] [place], [place] [address]    Culture  Setup Time   Final    Organism ID to follow IN BOTH AEROBIC AND ANAEROBIC BOTTLES GRAM NEGATIVE RODS CRITICAL RESULT CALLED TO, READ BACK BY AND VERIFIED WITH: NATHAN BELUTH @ 1308 ON 10/17/2021.Marland KitchenMarland KitchenTKR GRAM STAIN REVIEWED-AGREE WITH RESULT Performed at [facility], [place] [address] [place], [place] [address]    Culture GRAM NEGATIVE RODS  Final   Report Status PENDING  Incomplete  Blood Culture ID Panel (Reflexed)     Status: Abnormal   Collection Time: 12/16/21  2:02 PM  Result Value Ref Range Status   Enterococcus faecalis NOT DETECTED NOT DETECTED Final   Enterococcus Faecium NOT DETECTED NOT DETECTED Final   Listeria monocytogenes NOT DETECTED NOT DETECTED Final   Staphylococcus species NOT DETECTED NOT DETECTED Final   Staphylococcus aureus (BCID) NOT DETECTED NOT DETECTED Final   Staphylococcus epidermidis NOT DETECTED NOT DETECTED Final   Staphylococcus lugdunensis NOT DETECTED NOT DETECTED Final   Streptococcus species NOT DETECTED NOT DETECTED Final   Streptococcus agalactiae NOT DETECTED NOT DETECTED Final   Streptococcus pneumoniae NOT DETECTED NOT DETECTED Final   Streptococcus pyogenes NOT DETECTED NOT DETECTED Final   A.calcoaceticus-baumannii NOT DETECTED NOT DETECTED Final   Bacteroides fragilis NOT DETECTED NOT DETECTED Final   Enterobacterales DETECTED (A) NOT DETECTED Final    Comment: Enterobacterales represent a large order of gram negative bacteria, not a single organism. CRITICAL RESULT CALLED TO, READ BACK BY AND VERIFIED  WITH: NATHAN BELUTH @ 0315 ON 10/17/2021.Marland KitchenMarland KitchenTKR    Enterobacter cloacae complex NOT DETECTED NOT DETECTED Final   Escherichia coli DETECTED (A) NOT DETECTED Final    Comment: CRITICAL RESULT CALLED TO, READ BACK BY AND VERIFIED WITH: NATHAN BELUTH @ 9458 ON 10/17/2021.Marland KitchenMarland KitchenTKR    Klebsiella aerogenes NOT DETECTED NOT DETECTED Final   Klebsiella oxytoca NOT DETECTED NOT DETECTED Final   Klebsiella pneumoniae NOT DETECTED NOT DETECTED Final   Proteus species NOT DETECTED NOT DETECTED Final   Salmonella species NOT DETECTED NOT DETECTED Final   Serratia marcescens NOT DETECTED NOT DETECTED Final   Haemophilus influenzae NOT DETECTED NOT DETECTED Final   Neisseria meningitidis NOT DETECTED NOT DETECTED Final   Pseudomonas aeruginosa NOT DETECTED NOT DETECTED Final   Stenotrophomonas maltophilia NOT DETECTED NOT DETECTED Final   Candida albicans NOT DETECTED NOT DETECTED Final   Candida auris NOT DETECTED NOT DETECTED Final   Candida glabrata NOT DETECTED NOT DETECTED Final   Candida krusei NOT DETECTED NOT DETECTED Final   Candida parapsilosis NOT DETECTED NOT DETECTED Final   Candida tropicalis NOT DETECTED NOT DETECTED Final   Cryptococcus neoformans/gattii NOT DETECTED NOT DETECTED Final   CTX-M ESBL NOT DETECTED NOT DETECTED Final   Carbapenem resistance IMP NOT DETECTED NOT DETECTED Final   Carbapenem resistance KPC NOT DETECTED NOT DETECTED Final   Carbapenem resistance NDM NOT DETECTED NOT DETECTED Final   Carbapenem resist OXA 48 LIKE NOT DETECTED NOT DETECTED Final   Carbapenem resistance VIM NOT DETECTED NOT DETECTED Final    Comment: Performed at [facility], [address] [place], [place] [address]  MRSA Next Gen by PCR, Nasal     Status: None   Collection Time: 12/16/21  7:09 PM   Specimen: Nasal Mucosa; Nasal Swab  Result Value Ref Range Status   MRSA by PCR Next Gen NOT DETECTED NOT DETECTED Final    Comment: (NOTE) The GeneXpert MRSA Assay (FDA approved for  NASAL specimens only), is one component of a comprehensive MRSA colonization surveillance program. It is not intended to diagnose MRSA infection nor to guide or monitor treatment for MRSA infections. Test performance is not FDA approved in patients less than 75 years old. Performed at [facility], [address] [place], [place] [address]          Radiology Studies: CT ABDOMEN PELVIS WO CONTRAST  Result Date: 12/16/2021 CLINICAL DATA:  Abdominal pain, acute, nonlocalized. Metastatic colon  cancer post Y 90 therapy 2 months ago. Patient reports no oral intake over the last 5 days. * Tracking Code: BO * EXAM: CT ABDOMEN AND PELVIS WITHOUT CONTRAST TECHNIQUE: Multidetector CT imaging of the abdomen and pelvis was performed following the standard protocol without IV contrast. RADIATION DOSE REDUCTION: This exam was performed according to the departmental dose-optimization program which includes automated exposure control, adjustment of the mA and/or kV according to patient size and/or use of iterative reconstruction technique. COMPARISON:  Abdominopelvic CT 07/31/2021. FINDINGS: Lower chest: Stable right lower lobe scarring. There are several new nodular densities at the left lung base, suspicious for metastatic disease. Representative nodules include a 4 mm nodule on image 6/3 and a 4 mm nodule on image 8/3. No significant pleural or pericardial effusion. Hepatobiliary: Suboptimal visualization of the previously demonstrated multifocal hepatic metastases on this noncontrast study. Lesions in the right lobe appears slightly smaller, measuring approximately 2.7 cm at the dome on image 9/2 and 2.2 cm inferiorly on image 29/2. A lesion in the left lobe may be slightly larger, measuring approximately 2.7 cm on image 25/2 (previously 2.1 cm. No evidence of gallstones, gallbladder wall thickening or biliary dilatation. Pancreas: Unremarkable. No pancreatic ductal dilatation or surrounding  inflammatory changes. Spleen: Normal in size without focal abnormality. Adrenals/Urinary Tract: Both adrenal glands appear normal. Bilateral double-J ureteral stents remain in place with interval progressive bilateral hydronephrosis and mild perinephric soft tissue stranding. There is prominent encrustation in the urinary bladder, likely causing stent malfunction. No definite renal or ureteral calculi. Chronic right renal cortical thinning. Mild nonspecific bladder wall thickening. Stomach/Bowel: No enteric contrast was administered. The stomach appears unremarkable for its degree of distention. The small bowel, appendix and proximal colon appear normal. Progressive circumferential wall thickening throughout the rectosigmoid colon with progressive adjacent nodularity, suspicious for tumor recurrence and peritoneal carcinomatosis. There is continuity of gas between the rectal lumen and the vagina, highly suspicious for a rectovaginal fistula. No abnormal gas seen in the bladder. Vascular/Lymphatic: No discretely enlarged abdominopelvic lymph nodes identified. No acute vascular findings on noncontrast imaging. 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 As above, gas within the vagina communicates with the rectum, consistent with an underlying rectovaginal fistula. No adnexal mass. Other: Increased diffuse perirectal soft tissue nodularity suspicious for local recurrence. Small amount of pelvic ascites. No generalized ascites or peritoneal nodularity identified within the abdomen. Musculoskeletal: No acute or significant osseous findings. IMPRESSION: 1. Progressive pelvic soft tissue nodularity and nodular mural thickening of the rectosigmoid colon consistent with progressive local malignancy (colon cancer). 2. 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 3. New moderate bilateral hydronephrosis attributed to prominent ureteral stent encrustation in the bladder. The  stents are likely nonfunctional, and replacement should be considered. 4. Known hepatic metastatic disease is not optimally visualized, but does not appear significantly progressive. 5. New nodularity at the left lung base, suspicious for progressive metastatic disease. Electronically Signed   By: Richardean Sale M.D.   On: 12/16/2021 16:04   DG Chest 1 View  Result Date: 12/16/2021 CLINICAL DATA:  Central line placement EXAM: CHEST  1 VIEW COMPARISON:  12/16/2021 FINDINGS: Single frontal view of the chest demonstrates stable left chest wall port tip within the superior vena cava. Right internal jugular catheter tip overlies superior vena cava. Cardiac silhouette is unremarkable. No airspace disease, effusion, or pneumothorax. No acute bony abnormalities. IMPRESSION: 1. No complication after right internal jugular catheter placement. 2. No acute intrathoracic process.  Electronically Signed   By: Randa Ngo M.D.   On: 12/16/2021 21:30   DG Chest 1 View  Result Date: 12/16/2021 CLINICAL DATA:  Evaluate Port-A-Cath position. EXAM: CHEST  1 VIEW COMPARISON:  Chest CT 04/14/2021 FINDINGS: The left IJ Port-A-Cath appears to be in stable position when compared to the prior chest CT. It is directed slightly posteriorly but this is within the SVC on the chest CT and not in the azygous vein. IMPRESSION: Left IJ Port-A-Cath in stable position. Its tip is in the SVC based on the prior chest CT. Electronically Signed   By: Marijo Sanes M.D.   On: 12/16/2021 19:00   DG Chest 1 View  Result Date: 12/16/2021 CLINICAL DATA:  Abdominal pain. EXAM: CHEST  1 VIEW COMPARISON:  05/31/2020 and CT chest 04/14/2021. FINDINGS: Trachea is midline. Heart size normal. Left IJ Port-A-Cath tip may be directed posteriorly within the azygous vein. Lungs are clear. No pleural fluid. IMPRESSION: 1. No acute findings. 2. Left IJ Port-A-Cath tip may be directed posteriorly within the azygous vein. A lateral view would be helpful.  Electronically Signed   By: Lorin Picket M.D.   On: 12/16/2021 13:21        Scheduled Meds:  Chlorhexidine Gluconate Cloth  6 each Topical Q0600   midodrine  10 mg Oral TID WC   pantoprazole  40 mg Oral BID   Continuous Infusions:  cefTRIAXone (ROCEPHIN)  IV       LOS: 1 day    Time spent: 35 mins     Wyvonnia Dusky, MD [facility] Pager [phone]  If 7PM-7AM, please contact night-coverage 12/17/2021, 8:25 AM

## 2021-12-17 NOTE — Consult Note (Addendum)
Urology Consult  I have been asked to see the patient by Dr. Blaine Hamper, for evaluation and management of acute renal failure, bilateral hydronephrosis, encrusted ureteral stents.  Chief Complaint: Generalized weakness, abdominal pain, poor PO intake x5 days  History of Present Illness: Anita Sullivan is a 56 y.o. year old female with metastatic colorectal cancer causing obstructive uropathy managed with bilateral ureteral stents placed in January 2022 admitted yesterday with acute on chronic renal failure and SIRS versus sepsis with metabolic derangement secondary to poor p.o. intake.  CT imaging revealed encrusted bilateral ureteral stents with significant bilateral hydronephrosis consistent with nonfunctioning stents, also with new appearing colovaginal fistula.  Foley catheter was placed last night for maximum urinary decompression, though UOP was only 150 mL overnight.  WBC count is down today, 38.0.  Hemoglobin down, 8.7.  Creatinine up, 7.12.  Admission UA is notable for >50 RBCs/hpf, >50 WBCs/hpf, many bacteria, and WBC clumps.  Urine culture is pending, though blood cultures are growing E. coli.  She has been started on antibiotics as below.  Today she reports ongoing left-sided abdominal and flank pain x5 days.  She has been n.p.o. since midnight. She is afebrile, BP is soft but stable.  Bilateral PCNs were originally placed upon diagnosis with metastatic colorectal cancer in October 2021.  These were internalized to bilateral ureteral stents in January 2022.  Unfortunately, she has not been followed or seen by urology or IR since.  Anti-infectives (From admission, onward)    Start     Dose/Rate Route Frequency Ordered Stop   12/17/21 1800  ceFEPIme (MAXIPIME) 2 g in sodium chloride 0.9 % 100 mL IVPB  Status:  Discontinued        2 g 200 mL/hr over 30 Minutes Intravenous Every 24 hours 12/16/21 1905 12/17/21 0249   12/17/21 0800  cefTRIAXone (ROCEPHIN) 2 g in sodium chloride 0.9 % 100  mL IVPB        2 g 200 mL/hr over 30 Minutes Intravenous Every 24 hours 12/17/21 0249     12/16/21 1545  ceFEPIme (MAXIPIME) 2 g in sodium chloride 0.9 % 100 mL IVPB        2 g 200 mL/hr over 30 Minutes Intravenous  Once 12/16/21 1534 12/16/21 1750        Past Medical History:  Diagnosis Date   Cancer associated pain    Colon cancer ([place])    Family history of pancreatic cancer    Family history of uterine cancer     Past Surgical History:  Procedure Laterality Date   COLONOSCOPY WITH PROPOFOL N/A 05/27/2020   Procedure: COLONOSCOPY WITH PROPOFOL;  Surgeon: [name], MD;  Location: [facility];  Service: Gastroenterology;  Laterality: N/A;   FLEXIBLE SIGMOIDOSCOPY N/A 05/28/2020   Procedure: FLEXIBLE SIGMOIDOSCOPY;  Surgeon: [name], MD;  Location: [facility] ENDOSCOPY;  Service: Gastroenterology;  Laterality: N/A;   IR 3D INDEPENDENT WKST  06/09/2021   IR ANGIOGRAM SELECTIVE EACH ADDITIONAL VESSEL  06/09/2021   IR ANGIOGRAM SELECTIVE EACH ADDITIONAL VESSEL  06/09/2021   IR ANGIOGRAM SELECTIVE EACH ADDITIONAL VESSEL  06/09/2021   IR ANGIOGRAM SELECTIVE EACH ADDITIONAL VESSEL  08/18/2021   IR ANGIOGRAM SELECTIVE EACH ADDITIONAL VESSEL  08/18/2021   IR ANGIOGRAM SELECTIVE EACH ADDITIONAL VESSEL  08/18/2021   IR ANGIOGRAM SELECTIVE EACH ADDITIONAL VESSEL  10/20/2021   IR ANGIOGRAM SELECTIVE EACH ADDITIONAL VESSEL  10/20/2021   IR ANGIOGRAM VISCERAL SELECTIVE  06/09/2021   IR ANGIOGRAM VISCERAL SELECTIVE  08/18/2021  IR ANGIOGRAM VISCERAL SELECTIVE  09/01/2021   IR ANGIOGRAM VISCERAL SELECTIVE  10/20/2021   IR EMBO TUMOR ORGAN ISCHEMIA INFARCT INC GUIDE ROADMAPPING  09/01/2021   IR EMBO TUMOR ORGAN ISCHEMIA INFARCT INC GUIDE ROADMAPPING  10/20/2021   IR NEPHROSTOMY EXCHANGE LEFT  07/11/2020   IR NEPHROSTOMY EXCHANGE RIGHT  07/11/2020   IR NEPHROSTOMY PLACEMENT LEFT  05/23/2020   IR NEPHROSTOMY PLACEMENT RIGHT  05/23/2020   IR RADIOLOGIST EVAL & MGMT  04/28/2021   IR  URETERAL STENT LEFT NEW ACCESS W/O SEP NEPHROSTOMY CATH  08/15/2020   IR URETERAL STENT RIGHT NEW ACCESS W/O SEP NEPHROSTOMY CATH  08/15/2020   IR US GUIDE VASC ACCESS RIGHT  06/09/2021   IR US GUIDE VASC ACCESS RIGHT  08/18/2021   IR US GUIDE VASC ACCESS RIGHT  09/01/2021   IR US GUIDE VASC ACCESS RIGHT  10/20/2021   PORTACATH PLACEMENT Left 05/29/2020   Procedure: INSERTION PORT-A-CATH;  Surgeon: [name], MD;  Location: [facility] ORS;  Service: General;  Laterality: Left;    Home Medications:  No outpatient medications have been marked as taking for the 12/16/21 encounter [facility] Encounter).    Allergies: No Known Allergies  Family History  Problem Relation Age of Onset   Other Mother        Corticobasal degeneration   Uterine cancer Mother    Diabetes Maternal Grandmother    Stroke Maternal Grandfather    Stroke Paternal Grandfather    Heart disease Father    Pancreatic cancer Maternal Uncle 31   Cancer Paternal Aunt        unk type   Liver cancer Maternal Uncle    Pancreatic cancer Cousin    Cancer Cousin        unk type    Social History:  reports that she has quit smoking. Her smoking use included cigarettes. She has never used smokeless tobacco. She reports that she does not drink alcohol and does not use drugs.  ROS: A complete review of systems was performed.  All systems are negative except for pertinent findings as noted.  Physical Exam:  Vital signs in last 24 hours: Temp:  [96.1 F (35.6 C)-97.8 F (36.6 C)] 97.5 F (36.4 C) (05/18 0800) Pulse Rate:  [86-105] 98 (05/18 0700) Resp:  [14-23] 22 (05/18 0800) BP: (91-106)/(62-78) 103/64 (05/18 0800) SpO2:  [95 %-99 %] 98 % (05/18 0700) Weight:  [55.4 kg] 55.4 kg (05/17 1905) Constitutional: Lethargic but arousable, ill-appearing, cachectic, no acute distress HEENT: [place] AT, moist mucus membranes Cardiovascular: No clubbing, cyanosis, or edema Respiratory: Normal respiratory effort Skin: No rashes, bruises or  suspicious lesions Neurologic: Grossly intact, no focal deficits, moving all 4 extremities Psychiatric: Normal mood and affect  Laboratory Data:  Recent Labs    12/16/21 1320 12/17/21 0425  WBC 43.9* 38.0*  HGB 11.6* 8.7*  HCT 38.1 27.4*   Recent Labs    12/16/21 1402 12/16/21 2121 12/17/21 0425  NA 129* 133* 133*  K 5.2* 4.5 4.7  CL 102 109 109  CO2 10* 10* 11*  GLUCOSE 96 92 77  BUN QUANTITY NOT SUFFICIENT, UNABLE TO PERFORM TEST 109* 111*  CREATININE 7.48* 6.75* 7.12*  CALCIUM 7.5* 6.9* 7.1*   Recent Labs    12/16/21 1357  INR 1.7*   Urinalysis    Component Value Date/Time   COLORURINE YELLOW (A) 12/17/2021 0140   APPEARANCEUR TURBID (A) 12/17/2021 0140   LABSPEC 1.005 12/17/2021 0140   PHURINE 7.0 12/17/2021 0140   GLUCOSEU  NEGATIVE 12/17/2021 0140   HGBUR LARGE (A) 12/17/2021 0140   BILIRUBINUR NEGATIVE 12/17/2021 0140   KETONESUR NEGATIVE 12/17/2021 0140   PROTEINUR 30 (A) 12/17/2021 0140   NITRITE NEGATIVE 12/17/2021 0140   LEUKOCYTESUR LARGE (A) 12/17/2021 0140   Results for orders placed or performed during the hospital encounter of 12/16/21  Culture, blood (Routine x 2)     Status: None (Preliminary result)   Collection Time: 12/16/21  1:57 PM   Specimen: BLOOD  Result Value Ref Range Status   Specimen Description   Final    BLOOD LEFT [facility] Performed at [facility], [address] [place], [place] [address]    Special Requests   Final    BOTTLES DRAWN AEROBIC AND ANAEROBIC Blood Culture adequate volume Performed at [facility], [address] [place], [place] [address]    Culture  Setup Time   Final    GRAM NEGATIVE RODS IN BOTH AEROBIC AND ANAEROBIC BOTTLES CRITICAL VALUE NOTED.  VALUE IS CONSISTENT WITH PREVIOUSLY REPORTED AND CALLED VALUE. GRAM STAIN REVIEWED-AGREE WITH RESULT Performed at [facility], [place] [address] [place], [place] [address]    Culture GRAM NEGATIVE RODS  Final   Report Status PENDING   Incomplete  Culture, blood (Routine x 2)     Status: None (Preliminary result)   Collection Time: 12/16/21  2:02 PM   Specimen: BLOOD  Result Value Ref Range Status   Specimen Description   Final    BLOOD RIGHT ANTECUBITAL Performed at [facility], [address] [place], [place] [address]    Special Requests   Final    BOTTLES DRAWN AEROBIC AND ANAEROBIC Blood Culture results may not be optimal due to an inadequate volume of blood received in culture bottles Performed at [facility], [address] [place],  [address]    Culture  Setup Time   Final    Organism ID to follow IN BOTH AEROBIC AND ANAEROBIC BOTTLES GRAM NEGATIVE RODS CRITICAL RESULT CALLED TO, READ BACK BY AND VERIFIED WITH: NATHAN BELUTH @ 7408 ON 10/17/2021.Marland KitchenMarland KitchenTKR GRAM STAIN REVIEWED-AGREE WITH RESULT Performed at [facility], [place] [address], [place],  [address]    Culture GRAM NEGATIVE RODS  Final   Report Status PENDING  Incomplete  Blood Culture ID Panel (Reflexed)     Status: Abnormal   Collection Time: 12/16/21  2:02 PM  Result Value Ref Range Status   Enterococcus faecalis NOT DETECTED NOT DETECTED Final   Enterococcus Faecium NOT DETECTED NOT DETECTED Final   Listeria monocytogenes NOT DETECTED NOT DETECTED Final   Staphylococcus species NOT DETECTED NOT DETECTED Final   Staphylococcus aureus (BCID) NOT DETECTED NOT DETECTED Final   Staphylococcus epidermidis NOT DETECTED NOT DETECTED Final   Staphylococcus lugdunensis NOT DETECTED NOT DETECTED Final   Streptococcus species NOT DETECTED NOT DETECTED Final   Streptococcus agalactiae NOT DETECTED NOT DETECTED Final   Streptococcus pneumoniae NOT DETECTED NOT DETECTED Final   Streptococcus pyogenes NOT DETECTED NOT DETECTED Final   A.calcoaceticus-baumannii NOT DETECTED NOT DETECTED Final   Bacteroides fragilis NOT DETECTED NOT DETECTED Final   Enterobacterales DETECTED (A) NOT DETECTED Final    Comment:  Enterobacterales represent a large order of gram negative bacteria, not a single organism. CRITICAL RESULT CALLED TO, READ BACK BY AND VERIFIED WITH: NATHAN BELUTH @ [phone] ON 10/17/2021.Marland KitchenMarland KitchenTKR    Enterobacter cloacae complex NOT DETECTED NOT DETECTED Final   Escherichia coli DETECTED (A) NOT DETECTED Final  Comment: CRITICAL RESULT CALLED TO, READ BACK BY AND VERIFIED WITH: NATHAN BELUTH @ 8841 ON 10/17/2021.Marland KitchenMarland KitchenTKR    Klebsiella aerogenes NOT DETECTED NOT DETECTED Final   Klebsiella oxytoca NOT DETECTED NOT DETECTED Final   Klebsiella pneumoniae NOT DETECTED NOT DETECTED Final   Proteus species NOT DETECTED NOT DETECTED Final   Salmonella species NOT DETECTED NOT DETECTED Final   Serratia marcescens NOT DETECTED NOT DETECTED Final   Haemophilus influenzae NOT DETECTED NOT DETECTED Final   Neisseria meningitidis NOT DETECTED NOT DETECTED Final   Pseudomonas aeruginosa NOT DETECTED NOT DETECTED Final   Stenotrophomonas maltophilia NOT DETECTED NOT DETECTED Final   Candida albicans NOT DETECTED NOT DETECTED Final   Candida auris NOT DETECTED NOT DETECTED Final   Candida glabrata NOT DETECTED NOT DETECTED Final   Candida krusei NOT DETECTED NOT DETECTED Final   Candida parapsilosis NOT DETECTED NOT DETECTED Final   Candida tropicalis NOT DETECTED NOT DETECTED Final   Cryptococcus neoformans/gattii NOT DETECTED NOT DETECTED Final   CTX-M ESBL NOT DETECTED NOT DETECTED Final   Carbapenem resistance IMP NOT DETECTED NOT DETECTED Final   Carbapenem resistance KPC NOT DETECTED NOT DETECTED Final   Carbapenem resistance NDM NOT DETECTED NOT DETECTED Final   Carbapenem resist OXA 48 LIKE NOT DETECTED NOT DETECTED Final   Carbapenem resistance VIM NOT DETECTED NOT DETECTED Final    Comment: Performed at [facility], [address] [place], [place] [address]  MRSA Next Gen by PCR, Nasal     Status: None   Collection Time: 12/16/21  7:09 PM   Specimen: Nasal Mucosa; Nasal Swab  Result  Value Ref Range Status   MRSA by PCR Next Gen NOT DETECTED NOT DETECTED Final    Comment: (NOTE) The GeneXpert MRSA Assay (FDA approved for NASAL specimens only), is one component of a comprehensive MRSA colonization surveillance program. It is not intended to diagnose MRSA infection nor to guide or monitor treatment for MRSA infections. Test performance is not FDA approved in patients less than 57 years old. Performed at [facility], [address] [place], [place] [address]     Radiologic Imaging: CT ABDOMEN PELVIS WO CONTRAST  Result Date: 12/16/2021 CLINICAL DATA:  Abdominal pain, acute, nonlocalized. Metastatic colon cancer post Y 90 therapy 2 months ago. Patient reports no oral intake over the last 5 days. * Tracking Code: BO * EXAM: CT ABDOMEN AND PELVIS WITHOUT CONTRAST TECHNIQUE: Multidetector CT imaging of the abdomen and pelvis was performed following the standard protocol without IV contrast. RADIATION DOSE REDUCTION: This exam was performed according to the departmental dose-optimization program which includes automated exposure control, adjustment of the mA and/or kV according to patient size and/or use of iterative reconstruction technique. COMPARISON:  Abdominopelvic CT 07/31/2021. FINDINGS: Lower chest: Stable right lower lobe scarring. There are several new nodular densities at the left lung base, suspicious for metastatic disease. Representative nodules include a 4 mm nodule on image 6/3 and a 4 mm nodule on image 8/3. No significant pleural or pericardial effusion. Hepatobiliary: Suboptimal visualization of the previously demonstrated multifocal hepatic metastases on this noncontrast study. Lesions in the right lobe appears slightly smaller, measuring approximately 2.7 cm at the dome on image 9/2 and 2.2 cm inferiorly on image 29/2. A lesion in the left lobe may be slightly larger, measuring approximately 2.7 cm on image 25/2 (previously 2.1 cm. No evidence of  gallstones, gallbladder wall thickening or biliary dilatation. Pancreas: Unremarkable. No pancreatic ductal dilatation or surrounding inflammatory changes.  Spleen: Normal in size without focal abnormality. Adrenals/Urinary Tract: Both adrenal glands appear normal. Bilateral double-J ureteral stents remain in place with interval progressive bilateral hydronephrosis and mild perinephric soft tissue stranding. There is prominent encrustation in the urinary bladder, likely causing stent malfunction. No definite renal or ureteral calculi. Chronic right renal cortical thinning. Mild nonspecific bladder wall thickening. Stomach/Bowel: No enteric contrast was administered. The stomach appears unremarkable for its degree of distention. The small bowel, appendix and proximal colon appear normal. Progressive circumferential wall thickening throughout the rectosigmoid colon with progressive adjacent nodularity, suspicious for tumor recurrence and peritoneal carcinomatosis. There is continuity of gas between the rectal lumen and the vagina, highly suspicious for a rectovaginal fistula. No abnormal gas seen in the bladder. Vascular/Lymphatic: No discretely enlarged abdominopelvic lymph nodes identified. No acute vascular findings on noncontrast imaging. 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 As above, gas within the vagina communicates with the rectum, consistent with an underlying rectovaginal fistula. No adnexal mass. Other: Increased diffuse perirectal soft tissue nodularity suspicious for local recurrence. Small amount of pelvic ascites. No generalized ascites or peritoneal nodularity identified within the abdomen. Musculoskeletal: No acute or significant osseous findings. IMPRESSION: 1. Progressive pelvic soft tissue nodularity and nodular mural thickening of the rectosigmoid colon consistent with progressive local malignancy (colon cancer). 2. 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 3. New moderate bilateral hydronephrosis attributed to prominent ureteral stent encrustation in the bladder. The stents are likely nonfunctional, and replacement should be considered. 4. Known hepatic metastatic disease is not optimally visualized, but does not appear significantly progressive. 5. New nodularity at the left lung base, suspicious for progressive metastatic disease. Electronically Signed   By: Richardean Sale M.D.   On: 12/16/2021 16:04   Assessment & Plan:  56 year old female with metastatic colorectal cancer with obstructive uropathy managed with indwelling ureteral stents admitted with likely urosepsis and acute renal failure due to encrusted and obstructed bilateral ureteral stents.  Unfortunately, her bilateral ureteral stents have not been exchanged since placement 16 months ago, which is likely contributory to her current picture.  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  Agree with antibiotics and supportive care.  At this time, I do not recommend an attempt at stent exchange given active infection.  Instead, I recommend bilateral PCN placement with IR today for urinary decompression.  In the future (not during the current admission) we may consider attempting bilateral ureteral stent removal versus exchange, however given the extent of encrustation on CT imaging, this will be challenging.  Patient is in agreement with pursuing bilateral PCN placement today.  I have spoken with IR regarding this and placed orders for procedure. Foley may be removed after PCN placement based on clinical improvement and per primary team.  Thank you for involving me in this patient's care, please page with any further questions or concerns.  Debroah Loop, PA-C 12/17/2021 8:43 AM

## 2021-12-17 NOTE — Progress Notes (Signed)
PHARMACY - PHYSICIAN COMMUNICATION CRITICAL VALUE ALERT - BLOOD CULTURE IDENTIFICATION (BCID)  BCID: 4 of 4 vials w/ E Coli, NR.  Pt currently on Cefepime.  Name of provider contacted: Jonny Ruiz, NP  Changes to prescribed antibiotics required: Transition pt to Ceftriaxone 2 gm.  Renda Rolls, PharmD, [facility] 12/17/2021 2:53 AM

## 2021-12-17 NOTE — Progress Notes (Addendum)
Initial Nutrition Assessment  DOCUMENTATION CODES:   Severe malnutrition in context of chronic illness  INTERVENTION:   Ensure Enlive po TID with diet advancement, each supplement provides 350 kcal and 20 grams of protein.  MVI po daily with diet advancement   Recommend liberal diet   Pt at high refeed risk; recommend monitor potassium, magnesium and phosphorus labs daily until stable  NUTRITION DIAGNOSIS:   Severe Malnutrition related to cancer and cancer related treatments as evidenced by percent weight loss, mild fat depletion, mild muscle depletion, energy intake < 75% for > or equal to 1 month.  GOAL:   Patient will meet greater than or equal to 90% of their needs  MONITOR:   PO intake, Supplement acceptance, Labs, Weight trends, Skin, I & O's  REASON FOR ASSESSMENT:   Malnutrition Screening Tool    ASSESSMENT:   56 y.o. female with medical history significant of obstructive uropathy and hydronephrosis (s/p PCNs 10/21 and s/p bilateral ureteral stent placement 08/15/2020), metastasized colon cancer s/p chemotherapy, CKD-3A, GERD and GI bleeding who is admitted with obstructive uropathy with worsening bilateral hydronephrosis, sepsis and SIRS.  Met with pt and pt's father in room today. Pt reports that she feels fair today; pt appears weak and pale. Pt reports that she has not eaten any food for 5 days. Pt's father at bedside reports that pt's oral intake has been poor ever since started her cancer treatments. Pt reports loss of taste. Per chart, pt is down 20lbs(15%) over the past 6 months; this is significant weight loss. Pt has not been drinking any supplements at home; pt drinks mainly hot tea. Pt NPO today for bilateral PCN placement. RD will add supplements and MVI once pt's diet is advanced. Recommend nasogastric tube placement and nutrition support if pt's oral intake is poor in hospital. Pt is at high refeed risk.   Medications reviewed and include: protonix,  ceftriaxone, Na bicarbonate   Labs reviewed: Na 133(L), K 4.7 wnl, BUN 111(H), creat 7.12(H), Mg 2.7(H) Wbc- 38.0(H), Hgb 8.7(L), Hct 27.4(L) Cbgs- 155, 67 x 24 hrs  NUTRITION - FOCUSED PHYSICAL EXAM:  Flowsheet Row Most Recent Value  Orbital Region Mild depletion  Upper Arm Region No depletion  Thoracic and Lumbar Region No depletion  Buccal Region No depletion  Temple Region Mild depletion  Clavicle Bone Region Mild depletion  Clavicle and Acromion Bone Region Mild depletion  Scapular Bone Region No depletion  Dorsal Hand No depletion  Patellar Region No depletion  Anterior Thigh Region No depletion  Posterior Calf Region No depletion  Edema (RD Assessment) Moderate  Hair Reviewed  Eyes Reviewed  Mouth Reviewed  Skin Reviewed  Nails Reviewed   Diet Order:   Diet Order             Diet NPO time specified Except for: Ice Chips, Sips with Meds  Diet effective now                  EDUCATION NEEDS:   Education needs have been addressed  Skin:  Skin Assessment: Reviewed RN Assessment (ecchymosis)  Last BM:  5/18- TYPE 7  Height:   Ht Readings from Last 1 Encounters:  12/16/21 $RemoveB'5\' 3"'oEoraioP$  (1.6 m)    Weight:   Wt Readings from Last 1 Encounters:  12/16/21 55.4 kg    Ideal Body Weight:  52.2 kg  BMI:  Body mass index is 21.64 kg/m.  Estimated Nutritional Needs:   Kcal:  1700-1900kcal/day  Protein:  85-95g/day  Fluid:  1.7-1.9L/day  Koleen Distance MS, RD, LDN Please refer to [facility] for RD and/or RD on-call/weekend/after hours pager

## 2021-12-17 NOTE — Progress Notes (Signed)
Date and time results received: 12/17/21 0238  Test: Serum Osmolality  Critical Value: 321  Name of Provider Notified: Rufina Falco, NP  Orders Received? Or Actions Taken?:  No new orders at this time.

## 2021-12-17 NOTE — Consult Note (Signed)
Chief Complaint: Patient was seen in consultation today for  Chief Complaint  Patient presents with   Abdominal Pain   Weakness    Referring Physician(s): Debroah Loop, PA-C  Supervising Physician: Juliet Rude  Patient Status: [place] - In-pt  History of Present Illness: Anita Sullivan is a 56 y.o. 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 She is s/p bilateral ureteral stent placements in IR 08/15/20. She presented to the [facility] ED 12/16/21 with generalized weakness, abdominal pain and poor PO intake for 5 days. Work up was significant for leukocytosis, acute renal failure, progressive colon cancer and new, moderate bilateral hydronephrosis.   CT Abdomen/Pelvis w/out Contrast 12/16/21 IMPRESSION: 1. Progressive pelvic soft tissue nodularity and nodular mural thickening of the rectosigmoid colon consistent with progressive local malignancy (colon cancer). 2. 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 3. New moderate bilateral hydronephrosis attributed to prominent ureteral stent encrustation in the bladder. The stents are likely nonfunctional, and replacement should be considered. 4. Known hepatic metastatic disease is not optimally visualized, but does not appear significantly progressive. 5. New nodularity at the left lung base, suspicious for progressive metastatic disease.  Urology and Nephrology teams have been consulted. Interventional Radiology has been asked to evaluate this patient for image-guided bilateral  nephrostomy tube placements. Imaging reviewed and procedure approved by Dr. Mel Almond.  This patient is also familiar to IR from several targeted liver therapies, biopsies, and nephrostomy tubes.  Past Medical History:  Diagnosis Date   Cancer associated pain    Colon cancer ([place])    Family history of pancreatic  cancer    Family history of uterine cancer     Past Surgical History:  Procedure Laterality Date   COLONOSCOPY WITH PROPOFOL N/A 05/27/2020   Procedure: COLONOSCOPY WITH PROPOFOL;  Surgeon: [name], MD;  Location: [facility] ENDOSCOPY;  Service: Gastroenterology;  Laterality: N/A;   FLEXIBLE SIGMOIDOSCOPY N/A 05/28/2020   Procedure: FLEXIBLE SIGMOIDOSCOPY;  Surgeon: [name], MD;  Location: [facility] ENDOSCOPY;  Service: Gastroenterology;  Laterality: N/A;   IR 3D INDEPENDENT WKST  06/09/2021   IR ANGIOGRAM SELECTIVE EACH ADDITIONAL VESSEL  06/09/2021   IR ANGIOGRAM SELECTIVE EACH ADDITIONAL VESSEL  06/09/2021   IR ANGIOGRAM SELECTIVE EACH ADDITIONAL VESSEL  06/09/2021   IR ANGIOGRAM SELECTIVE EACH ADDITIONAL VESSEL  08/18/2021   IR ANGIOGRAM SELECTIVE EACH ADDITIONAL VESSEL  08/18/2021   IR ANGIOGRAM SELECTIVE EACH ADDITIONAL VESSEL  08/18/2021   IR ANGIOGRAM SELECTIVE EACH ADDITIONAL VESSEL  10/20/2021   IR ANGIOGRAM SELECTIVE EACH ADDITIONAL VESSEL  10/20/2021   IR ANGIOGRAM VISCERAL SELECTIVE  06/09/2021   IR ANGIOGRAM VISCERAL SELECTIVE  08/18/2021   IR ANGIOGRAM VISCERAL SELECTIVE  09/01/2021   IR ANGIOGRAM VISCERAL SELECTIVE  10/20/2021   IR EMBO TUMOR ORGAN ISCHEMIA INFARCT INC GUIDE ROADMAPPING  09/01/2021   IR EMBO TUMOR ORGAN ISCHEMIA INFARCT INC GUIDE ROADMAPPING  10/20/2021   IR NEPHROSTOMY EXCHANGE LEFT  07/11/2020   IR NEPHROSTOMY EXCHANGE RIGHT  07/11/2020   IR NEPHROSTOMY PLACEMENT LEFT  05/23/2020   IR NEPHROSTOMY PLACEMENT RIGHT  05/23/2020   IR RADIOLOGIST EVAL & MGMT  04/28/2021   IR URETERAL STENT LEFT NEW ACCESS W/O SEP NEPHROSTOMY CATH  08/15/2020   IR URETERAL STENT RIGHT NEW ACCESS W/O SEP NEPHROSTOMY CATH  08/15/2020   IR US GUIDE VASC ACCESS RIGHT  06/09/2021   IR US GUIDE VASC ACCESS RIGHT  08/18/2021   IR US GUIDE VASC ACCESS RIGHT  09/01/2021   IR US GUIDE VASC ACCESS RIGHT  10/20/2021   PORTACATH PLACEMENT Left 05/29/2020   Procedure: INSERTION PORT-A-CATH;   Surgeon: [name], MD;  Location: [facility] ORS;  Service: General;  Laterality: Left;    Allergies: Patient has no known allergies.  Medications: Prior to Admission medications   Medication Sig Start Date End Date Taking? Authorizing Provider  acetaminophen (TYLENOL) 325 MG tablet Take 650 mg by mouth every 6 (six) hours as needed.    [provider]  cyclobenzaprine (FLEXERIL) 10 MG tablet Take 1 tablet (10 mg total) by mouth 3 (three) times daily as needed for muscle spasms. 11/06/21   Hughie Closs, PA-C  HYDROcodone-acetaminophen (NORCO) 5-325 MG tablet Take 1 tablet by mouth every 6 (six) hours as needed for moderate pain. 11/30/21   [name], MD  lidocaine-prilocaine (EMLA) cream Apply 1 application topically as needed. Apply prior to appointment. Cover with plastic wrap. 11/05/20   [name], MD  Loperamide HCl (IMODIUM PO) Take by mouth.    [provider]  loratadine (CLARITIN) 10 MG tablet Take 10 mg by mouth daily.    [provider]  ondansetron (ZOFRAN) 4 MG tablet Take 1 tablet (4 mg total) by mouth every 8 (eight) hours as needed for nausea or vomiting. 11/30/21   [name], MD  pantoprazole (PROTONIX) 40 MG tablet Take 1 tablet (40 mg total) by mouth 2 (two) times daily. 11/05/20   [name], MD  prochlorperazine (COMPAZINE) 10 MG tablet Take 1 tablet (10 mg total) by mouth every 6 (six) hours as needed (Nausea or vomiting). 06/04/20 06/12/20  [name], MD     Family History  Problem Relation Age of Onset   Other Mother        Corticobasal degeneration   Uterine cancer Mother    Diabetes Maternal Grandmother    Stroke Maternal Grandfather    Stroke Paternal Grandfather    Heart disease Father    Pancreatic cancer Maternal Uncle 43   Cancer Paternal Aunt        unk type   Liver cancer Maternal Uncle    Pancreatic cancer Cousin    Cancer Cousin        unk type    Social History    Socioeconomic History   Marital status: Divorced    Spouse name: Not on file   Number of children: 1   Years of education: Not on file   Highest education level: Not on file  Occupational History   Not on file  Tobacco Use   Smoking status: Former    Types: Cigarettes   Smokeless tobacco: Never  Vaping Use   Vaping Use: Never used  Substance and Sexual Activity   Alcohol use: Never   Drug use: Never   Sexual activity: Not Currently  Other Topics Concern   Not on file  Social History Narrative   Lives with dad, 1 daughter and 2 dogs   Social Determinants of Health   Financial Resource Strain: Not on file  Food Insecurity: Food Insecurity Present   Worried About Charity fundraiser in the Last Year: Sometimes true   [place] in the Last Year: Sometimes true  Transportation Needs: No Transportation Needs   Lack of Transportation (Medical): No   Lack of Transportation (Non-Medical): No  Physical Activity: Not on file  Stress: Not on file  Social Connections: Socially Isolated   Frequency of Communication with Friends and Family: Once a week   Frequency of Social Gatherings with Friends and Family: More than three times a week   Attends Religious Services: Never   Marine scientist or Organizations: No   Attends Archivist Meetings: Never   Marital Status: Divorced    Review of Systems: A 12 point ROS discussed and pertinent positives are indicated in the HPI above.  All other systems are negative.  Review of Systems  Constitutional:  Positive for appetite change and fatigue.  Respiratory:  Positive for shortness of breath. Negative for cough.   Cardiovascular:  Negative for chest pain.  Gastrointestinal:  Positive for abdominal pain, diarrhea, nausea and vomiting.  Neurological:  Positive for dizziness and headaches.   Vital Signs: BP 103/64 (BP Location: Right Arm)   Pulse 98   Temp (!) 97.5 F (36.4 C) (Axillary)   Resp (!) 22   Ht 5'  3" (1.6 m)   Wt 122 lb 2.2 oz (55.4 kg)   SpO2 98%   BMI 21.64 kg/m   Physical Exam Constitutional:      General: She is not in acute distress.    Appearance: She is ill-appearing.  HENT:     Mouth/Throat:     Mouth: Mucous membranes are dry.     Pharynx: Oropharynx is clear.  Cardiovascular:     Pulses: Normal pulses.     Heart sounds: Normal heart sounds.     Comments: Left upper chest port-a-catheter; RIJ central venous catheter  Pulmonary:     Effort: Pulmonary effort is normal.     Breath sounds: Normal breath sounds.  Abdominal:     General: Bowel sounds are normal.     Palpations: Abdomen is soft.     Tenderness: There is abdominal tenderness.  Genitourinary:    Comments: Catheter  Skin:    General: Skin is warm and dry.  Neurological:     Mental Status: She is alert and oriented to person, place, and time.    Imaging: CT ABDOMEN PELVIS WO CONTRAST  Result Date: 12/16/2021 CLINICAL DATA:  Abdominal pain, acute, nonlocalized. Metastatic colon cancer post Y 90 therapy 2 months ago. Patient reports no oral intake over the last 5 days. * Tracking Code: BO * EXAM: CT ABDOMEN AND PELVIS WITHOUT CONTRAST TECHNIQUE: Multidetector CT imaging of the abdomen and pelvis was performed following the standard protocol without IV contrast. RADIATION DOSE REDUCTION: This exam was performed according to the departmental dose-optimization program which includes automated exposure control, adjustment of the mA and/or kV according to patient size and/or use of iterative reconstruction technique. COMPARISON:  Abdominopelvic CT 07/31/2021. FINDINGS: Lower chest: Stable right lower lobe scarring. There are several new nodular densities at the left lung base, suspicious for metastatic disease. Representative nodules include a 4 mm nodule on image 6/3 and a 4 mm nodule on image 8/3. No significant pleural or pericardial effusion. Hepatobiliary: Suboptimal visualization of the previously demonstrated  multifocal hepatic metastases on this noncontrast study. Lesions in the right lobe appears slightly smaller, measuring approximately 2.7 cm at the dome on image 9/2 and 2.2 cm inferiorly on image 29/2. A lesion in the left lobe may be slightly larger, measuring approximately 2.7 cm on image 25/2 (previously 2.1 cm. No evidence of gallstones, gallbladder wall thickening or biliary dilatation. Pancreas: Unremarkable. No pancreatic ductal dilatation or surrounding inflammatory changes. Spleen: Normal in size without focal abnormality. Adrenals/Urinary Tract: Both  adrenal glands appear normal. Bilateral double-J ureteral stents remain in place with interval progressive bilateral hydronephrosis and mild perinephric soft tissue stranding. There is prominent encrustation in the urinary bladder, likely causing stent malfunction. No definite renal or ureteral calculi. Chronic right renal cortical thinning. Mild nonspecific bladder wall thickening. Stomach/Bowel: No enteric contrast was administered. The stomach appears unremarkable for its degree of distention. The small bowel, appendix and proximal colon appear normal. Progressive circumferential wall thickening throughout the rectosigmoid colon with progressive adjacent nodularity, suspicious for tumor recurrence and peritoneal carcinomatosis. There is continuity of gas between the rectal lumen and the vagina, highly suspicious for a rectovaginal fistula. No abnormal gas seen in the bladder. Vascular/Lymphatic: No discretely enlarged abdominopelvic lymph nodes identified. No acute vascular findings on noncontrast imaging. 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 As above, gas within the vagina communicates with the rectum, consistent with an underlying rectovaginal fistula. No adnexal mass. Other: Increased diffuse perirectal soft tissue nodularity suspicious for local recurrence. Small amount of pelvic ascites. No generalized ascites or peritoneal nodularity  identified within the abdomen. Musculoskeletal: No acute or significant osseous findings. IMPRESSION: 1. Progressive pelvic soft tissue nodularity and nodular mural thickening of the rectosigmoid colon consistent with progressive local malignancy (colon cancer). 2. 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 3. New moderate bilateral hydronephrosis attributed to prominent ureteral stent encrustation in the bladder. The stents are likely nonfunctional, and replacement should be considered. 4. Known hepatic metastatic disease is not optimally visualized, but does not appear significantly progressive. 5. New nodularity at the left lung base, suspicious for progressive metastatic disease. Electronically Signed   By: Richardean Sale M.D.   On: 12/16/2021 16:04   DG Chest 1 View  Result Date: 12/16/2021 CLINICAL DATA:  Central line placement EXAM: CHEST  1 VIEW COMPARISON:  12/16/2021 FINDINGS: Single frontal view of the chest demonstrates stable left chest wall port tip within the superior vena cava. Right internal jugular catheter tip overlies superior vena cava. Cardiac silhouette is unremarkable. No airspace disease, effusion, or pneumothorax. No acute bony abnormalities. IMPRESSION: 1. No complication after right internal jugular catheter placement. 2. No acute intrathoracic process. Electronically Signed   By: Randa Ngo M.D.   On: 12/16/2021 21:30   DG Chest 1 View  Result Date: 12/16/2021 CLINICAL DATA:  Evaluate Port-A-Cath position. EXAM: CHEST  1 VIEW COMPARISON:  Chest CT 04/14/2021 FINDINGS: The left IJ Port-A-Cath appears to be in stable position when compared to the prior chest CT. It is directed slightly posteriorly but this is within the SVC on the chest CT and not in the azygous vein. IMPRESSION: Left IJ Port-A-Cath in stable position. Its tip is in the SVC based on the prior chest CT. Electronically Signed   By: Marijo Sanes M.D.   On:  12/16/2021 19:00   DG Chest 1 View  Result Date: 12/16/2021 CLINICAL DATA:  Abdominal pain. EXAM: CHEST  1 VIEW COMPARISON:  05/31/2020 and CT chest 04/14/2021. FINDINGS: Trachea is midline. Heart size normal. Left IJ Port-A-Cath tip may be directed posteriorly within the azygous vein. Lungs are clear. No pleural fluid. IMPRESSION: 1. No acute findings. 2. Left IJ Port-A-Cath tip may be directed posteriorly within the azygous vein. A lateral view would be helpful. Electronically Signed   By: Lorin Picket M.D.   On: 12/16/2021 13:21    Labs:  CBC: Recent Labs    09/15/21 1021 10/20/21 0751 12/16/21 1320 12/17/21 0425 12/17/21 0350  WBC 12.4* 9.6 43.9* 38.0*  --   HGB 12.0 10.9* 11.6* 8.7* 8.3*  HCT 37.8 34.0* 38.1 27.4*  --   PLT 335 213 215 166  --     COAGS: Recent Labs    08/18/21 0817 09/01/21 0842 10/20/21 0751 12/16/21 1357  INR 1.0 1.0 1.1 1.7*    BMP: Recent Labs    10/20/21 0751 12/16/21 1402 12/16/21 2121 12/17/21 0425  NA 132* 129* 133* 133*  K 3.6 5.2* 4.5 4.7  CL 99 102 109 109  CO2 23 10* 10* 11*  GLUCOSE 109* 96 92 77  BUN 23* QUANTITY NOT SUFFICIENT, UNABLE TO PERFORM TEST 109* 111*  CALCIUM 8.3* 7.5* 6.9* 7.1*  CREATININE 1.43* 7.48* 6.75* 7.12*  GFRNONAA 44* 6* 7* 6*    LIVER FUNCTION TESTS: Recent Labs    09/01/21 0842 09/15/21 1021 10/20/21 0751 12/16/21 1402  BILITOT 0.6 0.3 0.6 1.6*  AST '19 16 26 15  '$ ALT '9 7 15 8  '$ ALKPHOS 82 68 84 164*  PROT 7.8 7.7 7.8 7.4  ALBUMIN 3.4* 3.0* 3.0* 2.1*    TUMOR MARKERS: No results for input(s): AFPTM, CEA, CA199, CHROMGRNA in the last 8760 hours.  Assessment and Plan:  Metastatic colon cancer with obstructive uropathy and bilateral hydronephrosis:  Royetta Crochet. Sweeney, 56 year old female, presents today to the [facility] Interventional Radiology department for image-guided bilateral nephrostomy tube placements. She has been NPO. She is not on any blood-thinning  medications.   Risks and benefits of bilateral PCN placement were discussed with the patient including, but not limited to, infection, bleeding, significant bleeding causing loss or decrease in renal function or damage to adjacent structures.   All of the patient's questions were answered, patient is agreeable to proceed.  Consent signed and in chart.  Thank you for this interesting consult.  I greatly enjoyed meeting Alara Daniel and look forward to participating in their care.  A copy of this report was sent to the requesting provider on this date.  Electronically Signed: Soyla Dryer, AGACNP-BC [phone] 12/17/2021, 9:28 AM   I spent a total of 20 Minutes    in face to face in clinical consultation, greater than 50% of which was counseling/coordinating care for bilateral nephrostomy tube placements.

## 2021-12-18 DIAGNOSIS — N179 Acute kidney failure, unspecified: Secondary | ICD-10-CM | POA: Diagnosis not present

## 2021-12-18 DIAGNOSIS — N1831 Chronic kidney disease, stage 3a: Secondary | ICD-10-CM | POA: Diagnosis not present

## 2021-12-18 DIAGNOSIS — C19 Malignant neoplasm of rectosigmoid junction: Secondary | ICD-10-CM | POA: Diagnosis not present

## 2021-12-18 DIAGNOSIS — N139 Obstructive and reflux uropathy, unspecified: Secondary | ICD-10-CM | POA: Diagnosis not present

## 2021-12-18 DIAGNOSIS — R627 Adult failure to thrive: Secondary | ICD-10-CM | POA: Diagnosis not present

## 2021-12-18 DIAGNOSIS — R7881 Bacteremia: Secondary | ICD-10-CM | POA: Diagnosis not present

## 2021-12-18 LAB — COMPREHENSIVE METABOLIC PANEL
ALT: 8 U/L (ref 0–44)
AST: 18 U/L (ref 15–41)
Albumin: <1.5 g/dL — ABNORMAL LOW (ref 3.5–5.0)
Alkaline Phosphatase: 133 U/L — ABNORMAL HIGH (ref 38–126)
Anion gap: 14 (ref 5–15)
BUN: 95 mg/dL — ABNORMAL HIGH (ref 6–20)
CO2: 15 mmol/L — ABNORMAL LOW (ref 22–32)
Calcium: 7.2 mg/dL — ABNORMAL LOW (ref 8.9–10.3)
Chloride: 107 mmol/L (ref 98–111)
Creatinine, Ser: 5.29 mg/dL — ABNORMAL HIGH (ref 0.44–1.00)
GFR, Estimated: 9 mL/min — ABNORMAL LOW (ref >60)
Glucose, Bld: 88 mg/dL (ref 70–99)
Potassium: 4 mmol/L (ref 3.5–5.1)
Sodium: 136 mmol/L (ref 135–145)
Total Bilirubin: 1.6 mg/dL — ABNORMAL HIGH (ref 0.3–1.2)
Total Protein: 5.3 g/dL — ABNORMAL LOW (ref 6.5–8.1)

## 2021-12-18 LAB — CBC
HCT: 23.7 % — ABNORMAL LOW (ref 36.0–46.0)
Hemoglobin: 7.7 g/dL — ABNORMAL LOW (ref 12.0–15.0)
MCH: 29.7 pg (ref 26.0–34.0)
MCHC: 32.5 g/dL (ref 30.0–36.0)
MCV: 91.5 fL (ref 80.0–100.0)
Platelets: 79 10*3/uL — ABNORMAL LOW (ref 150–400)
RBC: 2.59 MIL/uL — ABNORMAL LOW (ref 3.87–5.11)
RDW: 19.9 % — ABNORMAL HIGH (ref 11.5–15.5)
WBC: 22.2 10*3/uL — ABNORMAL HIGH (ref 4.0–10.5)
nRBC: 0 % (ref 0.0–0.2)

## 2021-12-18 LAB — GLUCOSE, CAPILLARY: Glucose-Capillary: 95 mg/dL (ref 70–99)

## 2021-12-18 MED ORDER — IPRATROPIUM-ALBUTEROL 0.5-2.5 (3) MG/3ML IN SOLN: 3.0000 mL | Freq: Four times a day (QID) | RESPIRATORY_TRACT | Status: DC | PRN

## 2021-12-18 NOTE — Progress Notes (Signed)
PROGRESS NOTE    Anita Sullivan  TMA:[id number] DOB: January 26, 1966 DOA: 12/16/2021 PCP: [name], NP    Assessment & Plan:   Principal Problem:   Acute renal failure superimposed on stage 3a chronic kidney disease ([place]) Active Problems:   Hydronephrosis   Primary malignant neoplasm of colorectal area with metastasis (HCC)   Abdominal pain   Hyponatremia   Hyperkalemia   SIRS (systemic inflammatory response syndrome) (HCC)   Protein-calorie malnutrition, severe  Assessment and Plan: Failure to thrive/declining performance status: pt was made comfort care last night after a discussion w/ ICU NP, pt & pt's daughter overnight. Continue w/ comfort care only   Obstructive uropathy: w/ b/l hydronephrosis. S/p b/l ureteral stents on 08/15/20 but CT scan shows likely nonfunctional stents. S/p placement of b/l percutaneous nephrostomy tubes 12/17/21 as per IR.   AKI on CKDIIIb: Cr is trending down from day prior. Comfort care only    Sepsis: met criteria w/ leukocytosis, tachycardia, tachypnea & likely UTI & bacteremia.  Urine cx growing e. coli  Bacteremia: blood cxs growing e. coli, sens pending. Likely secondary to UTI. Continue on IV rocephin  Metabolic acidosis: improved slightly. Continue w/ comfort care only   Leukocytosis: secondary to above infection. Trending down.   Hyperkalemia: resolved  Hyponatremia: resolved   Colorectal cancer w/ mets: stage IV as per pt's daughter. S/p chemo, radiation. Declined to have any further treatment. 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 Has been pooping blood out as per pt's daughter. Declined blood transfusions. Comfort care only   Hyperbilirubinemia: etiology unclear. Comfort care only   Severe malnutrition: likely secondary to colorectal cancer. Comfort care only   DVT prophylaxis: comfort care  Code Status: DNR Family Communication: discussed pt's care w/ pt's daughter, Anita Sullivan,  and answered her questions  Disposition Plan: comfort care only, family is considering home w/ hospice   Level of care: Med-Surg  Status is: Inpatient Remains inpatient appropriate because: severity of illness    Consultants:  Urology IR  Nephro   Procedures:   Antimicrobials: rocephin    Subjective: Pt c/o  having to poop   Objective: Vitals:   12/18/21 0630 12/18/21 0645 12/18/21 0700 12/18/21 0754  BP:   (!) 90/58   Pulse: (!) 103 (!) 108 (!) 108 (!) 106  Resp: 20 17 (!) 22 (!) 23  Temp:    97.6 F (36.4 C)  TempSrc:    Axillary  SpO2: 96% 97% 98% 97%  Weight:      Height:        Intake/Output Summary (Last 24 hours) at 12/18/2021 0820 Last data filed at 12/18/2021 0700 Gross per 24 hour  Intake 2434.01 ml  Output 2050 ml  Net 384.01 ml   Filed Weights   12/16/21 1905  Weight: 55.4 kg    Examination:  General exam: Appears uncomfortable  Respiratory system: diminished breath sounds b/l  Cardiovascular system:S1/S2+. No rubs or gallops  Gastrointestinal system: Abd is soft, tenderness to palpation, normal bowel sounds  Central nervous system: Moves all extremities  Psychiatry: Judgement and insight appears poor. Flat mood and affect     Data Reviewed: I have personally reviewed following labs and imaging studies  CBC: Recent Labs  Lab 12/16/21 1320 12/17/21 0425 12/17/21 0909 12/17/21 1839  WBC 43.9* 38.0*  --   --   NEUTROABS 40.7*  --   --   --   HGB 11.6* 8.7* 8.3* 6.9*  HCT  38.1 27.4*  --  21.2*  MCV 97.9 93.8  --   --   PLT 215 166  --   --    Basic Metabolic Panel: Recent Labs  Lab 12/16/21 1402 12/16/21 2121 12/17/21 0425 12/18/21 0640  NA 129* 133* 133* 136  K 5.2* 4.5 4.7 4.0  CL 102 109 109 107  CO2 10* 10* 11* 15*  GLUCOSE 96 92 77 88  BUN QUANTITY NOT SUFFICIENT, UNABLE TO PERFORM TEST 109* 111* 95*  CREATININE 7.48* 6.75* 7.12* 5.29*  CALCIUM 7.5* 6.9* 7.1* 7.2*  MG  --   --  2.7*  --    GFR: Estimated  Creatinine Clearance: 10.2 mL/min (A) (by C-G formula based on SCr of 5.29 mg/dL (H)). Liver Function Tests: Recent Labs  Lab 12/16/21 1402 12/18/21 0640  AST 15 18  ALT 8 8  ALKPHOS 164* 133*  BILITOT 1.6* 1.6*  PROT 7.4 5.3*  ALBUMIN 2.1* <1.5*   No results for input(s): LIPASE, AMYLASE in the last 168 hours. No results for input(s): AMMONIA in the last 168 hours. Coagulation Profile: Recent Labs  Lab 12/16/21 1357  INR 1.7*   Cardiac Enzymes: No results for input(s): CKTOTAL, CKMB, CKMBINDEX, TROPONINI in the last 168 hours. BNP (last 3 results) No results for input(s): PROBNP in the last 8760 hours. HbA1C: No results for input(s): HGBA1C in the last 72 hours. CBG: Recent Labs  Lab 12/17/21 0911 12/17/21 1248 12/17/21 1559 12/17/21 1954 12/18/21 0751  GLUCAP 155* 84 100* 84 95   Lipid Profile: No results for input(s): CHOL, HDL, LDLCALC, TRIG, CHOLHDL, LDLDIRECT in the last 72 hours. Thyroid Function Tests: Recent Labs    12/17/21 0136  TSH 1.066   Anemia Panel: No results for input(s): VITAMINB12, FOLATE, FERRITIN, TIBC, IRON, RETICCTPCT in the last 72 hours. Sepsis Labs: Recent Labs  Lab 12/16/21 1357 12/16/21 2121  PROCALCITON  --  3.90  LATICACIDVEN 1.7 0.9    Recent Results (from the past 240 hour(s))  Culture, blood (Routine x 2)     Status: None (Preliminary result)   Collection Time: 12/16/21  1:57 PM   Specimen: BLOOD  Result Value Ref Range Status   Specimen Description   Final    BLOOD LEFT [facility] Performed at [facility], [address], [place], [place] [address]    Special Requests   Final    BOTTLES DRAWN AEROBIC AND ANAEROBIC Blood Culture adequate volume Performed at [facility], [address], [place], [place] [address]    Culture  Setup Time   Final    GRAM NEGATIVE RODS IN BOTH AEROBIC AND ANAEROBIC BOTTLES CRITICAL VALUE NOTED.  VALUE IS CONSISTENT WITH PREVIOUSLY REPORTED AND CALLED VALUE. GRAM STAIN  REVIEWED-AGREE WITH RESULT    Culture   Final    GRAM NEGATIVE RODS IDENTIFICATION TO FOLLOW Performed at [facility], [place] [address] [place], [place] [address]    Report Status PENDING  Incomplete  Culture, blood (Routine x 2)     Status: Abnormal (Preliminary result)   Collection Time: 12/16/21  2:02 PM   Specimen: BLOOD  Result Value Ref Range Status   Specimen Description   Final    BLOOD RIGHT ANTECUBITAL Performed at [facility], [address] [place], [place] [address]    Special Requests   Final    BOTTLES DRAWN AEROBIC AND ANAEROBIC Blood Culture results may not be optimal due to an inadequate volume of blood received in culture  bottles Performed at [facility], [address] [place], [place] [address]    Culture  Setup Time   Final    Organism ID to follow IN BOTH AEROBIC AND ANAEROBIC BOTTLES GRAM NEGATIVE RODS CRITICAL RESULT CALLED TO, READ BACK BY AND VERIFIED WITH: NATHAN BELUTH @ 4142 ON 10/17/2021.Marland KitchenMarland KitchenTKR GRAM STAIN REVIEWED-AGREE WITH RESULT    Culture (A)  Final    ESCHERICHIA COLI SUSCEPTIBILITIES TO FOLLOW Performed at [facility], [place] [address] [place], [place] [address]    Report Status PENDING  Incomplete  Blood Culture ID Panel (Reflexed)     Status: Abnormal   Collection Time: 12/16/21  2:02 PM  Result Value Ref Range Status   Enterococcus faecalis NOT DETECTED NOT DETECTED Final   Enterococcus Faecium NOT DETECTED NOT DETECTED Final   Listeria monocytogenes NOT DETECTED NOT DETECTED Final   Staphylococcus species NOT DETECTED NOT DETECTED Final   Staphylococcus aureus (BCID) NOT DETECTED NOT DETECTED Final   Staphylococcus epidermidis NOT DETECTED NOT DETECTED Final   Staphylococcus lugdunensis NOT DETECTED NOT DETECTED Final   Streptococcus species NOT DETECTED NOT DETECTED Final   Streptococcus agalactiae NOT DETECTED NOT DETECTED Final   Streptococcus pneumoniae NOT DETECTED NOT DETECTED Final    Streptococcus pyogenes NOT DETECTED NOT DETECTED Final   A.calcoaceticus-baumannii NOT DETECTED NOT DETECTED Final   Bacteroides fragilis NOT DETECTED NOT DETECTED Final   Enterobacterales DETECTED (A) NOT DETECTED Final    Comment: Enterobacterales represent a large order of gram negative bacteria, not a single organism. CRITICAL RESULT CALLED TO, READ BACK BY AND VERIFIED WITH: NATHAN BELUTH @ 0233 ON 10/17/2021.Marland KitchenMarland KitchenTKR    Enterobacter cloacae complex NOT DETECTED NOT DETECTED Final   Escherichia coli DETECTED (A) NOT DETECTED Final    Comment: CRITICAL RESULT CALLED TO, READ BACK BY AND VERIFIED WITH: NATHAN BELUTH @ 4356 ON 10/17/2021.Marland KitchenMarland KitchenTKR    Klebsiella aerogenes NOT DETECTED NOT DETECTED Final   Klebsiella oxytoca NOT DETECTED NOT DETECTED Final   Klebsiella pneumoniae NOT DETECTED NOT DETECTED Final   Proteus species NOT DETECTED NOT DETECTED Final   Salmonella species NOT DETECTED NOT DETECTED Final   Serratia marcescens NOT DETECTED NOT DETECTED Final   Haemophilus influenzae NOT DETECTED NOT DETECTED Final   Neisseria meningitidis NOT DETECTED NOT DETECTED Final   Pseudomonas aeruginosa NOT DETECTED NOT DETECTED Final   Stenotrophomonas maltophilia NOT DETECTED NOT DETECTED Final   Candida albicans NOT DETECTED NOT DETECTED Final   Candida auris NOT DETECTED NOT DETECTED Final   Candida glabrata NOT DETECTED NOT DETECTED Final   Candida krusei NOT DETECTED NOT DETECTED Final   Candida parapsilosis NOT DETECTED NOT DETECTED Final   Candida tropicalis NOT DETECTED NOT DETECTED Final   Cryptococcus neoformans/gattii NOT DETECTED NOT DETECTED Final   CTX-M ESBL NOT DETECTED NOT DETECTED Final   Carbapenem resistance IMP NOT DETECTED NOT DETECTED Final   Carbapenem resistance KPC NOT DETECTED NOT DETECTED Final   Carbapenem resistance NDM NOT DETECTED NOT DETECTED Final   Carbapenem resist OXA 48 LIKE NOT DETECTED NOT DETECTED Final   Carbapenem resistance VIM NOT DETECTED NOT  DETECTED Final    Comment: Performed at [facility], [address] [place], [place] [address]  MRSA Next Gen by PCR, Nasal     Status: None   Collection Time: 12/16/21  7:09 PM   Specimen: Nasal Mucosa; Nasal Swab  Result Value Ref Range Status   MRSA by PCR Next Gen NOT DETECTED NOT DETECTED Final  Comment: (NOTE) The GeneXpert MRSA Assay (FDA approved for NASAL specimens only), is one component of a comprehensive MRSA colonization surveillance program. It is not intended to diagnose MRSA infection nor to guide or monitor treatment for MRSA infections. Test performance is not FDA approved in patients less than 22 years old. Performed at [facility], [address] [place], [place] [address]   Urine Culture     Status: None (Preliminary result)   Collection Time: 12/17/21 12:04 PM   Specimen: Urine, Catheterized  Result Value Ref Range Status   Specimen Description URINE, CATHETERIZED  Final   Special Requests   Final    LEFT KIDNEY PCN DRAIN Performed at [facility], [place] [address] [place], [place] [address]    Culture PENDING  Incomplete   Report Status PENDING  Incomplete         Radiology Studies: CT ABDOMEN PELVIS WO CONTRAST  Result Date: 12/16/2021 CLINICAL DATA:  Abdominal pain, acute, nonlocalized. Metastatic colon cancer post Y 90 therapy 2 months ago. Patient reports no oral intake over the last 5 days. * Tracking Code: BO * EXAM: CT ABDOMEN AND PELVIS WITHOUT CONTRAST TECHNIQUE: Multidetector CT imaging of the abdomen and pelvis was performed following the standard protocol without IV contrast. RADIATION DOSE REDUCTION: This exam was performed according to the departmental dose-optimization program which includes automated exposure control, adjustment of the mA and/or kV according to patient size and/or use of iterative reconstruction technique. COMPARISON:  Abdominopelvic CT 07/31/2021. FINDINGS: Lower chest: Stable right lower lobe  scarring. There are several new nodular densities at the left lung base, suspicious for metastatic disease. Representative nodules include a 4 mm nodule on image 6/3 and a 4 mm nodule on image 8/3. No significant pleural or pericardial effusion. Hepatobiliary: Suboptimal visualization of the previously demonstrated multifocal hepatic metastases on this noncontrast study. Lesions in the right lobe appears slightly smaller, measuring approximately 2.7 cm at the dome on image 9/2 and 2.2 cm inferiorly on image 29/2. A lesion in the left lobe may be slightly larger, measuring approximately 2.7 cm on image 25/2 (previously 2.1 cm. No evidence of gallstones, gallbladder wall thickening or biliary dilatation. Pancreas: Unremarkable. No pancreatic ductal dilatation or surrounding inflammatory changes. Spleen: Normal in size without focal abnormality. Adrenals/Urinary Tract: Both adrenal glands appear normal. Bilateral double-J ureteral stents remain in place with interval progressive bilateral hydronephrosis and mild perinephric soft tissue stranding. There is prominent encrustation in the urinary bladder, likely causing stent malfunction. No definite renal or ureteral calculi. Chronic right renal cortical thinning. Mild nonspecific bladder wall thickening. Stomach/Bowel: No enteric contrast was administered. The stomach appears unremarkable for its degree of distention. The small bowel, appendix and proximal colon appear normal. Progressive circumferential wall thickening throughout the rectosigmoid colon with progressive adjacent nodularity, suspicious for tumor recurrence and peritoneal carcinomatosis. There is continuity of gas between the rectal lumen and the vagina, highly suspicious for a rectovaginal fistula. No abnormal gas seen in the bladder. Vascular/Lymphatic: No discretely enlarged abdominopelvic lymph nodes identified. No acute vascular findings on noncontrast imaging. Reproductive: Uterine fibroids are again  noted. As above, gas within the vagina communicates with the rectum, consistent with an underlying rectovaginal fistula. No adnexal mass. Other: Increased diffuse perirectal soft tissue nodularity suspicious for local recurrence. Small amount of pelvic ascites. No generalized ascites or peritoneal nodularity identified within the abdomen. Musculoskeletal: No acute or significant osseous findings. IMPRESSION: 1. Progressive pelvic soft tissue nodularity and nodular mural thickening of the  rectosigmoid colon consistent with progressive local malignancy (colon cancer). 2. 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 3. New moderate bilateral hydronephrosis attributed to prominent ureteral stent encrustation in the bladder. The stents are likely nonfunctional, and replacement should be considered. 4. Known hepatic metastatic disease is not optimally visualized, but does not appear significantly progressive. 5. New nodularity at the left lung base, suspicious for progressive metastatic disease. Electronically Signed   By: Richardean Sale M.D.   On: 12/16/2021 16:04   DG Chest 1 View  Result Date: 12/16/2021 CLINICAL DATA:  Central line placement EXAM: CHEST  1 VIEW COMPARISON:  12/16/2021 FINDINGS: Single frontal view of the chest demonstrates stable left chest wall port tip within the superior vena cava. Right internal jugular catheter tip overlies superior vena cava. Cardiac silhouette is unremarkable. No airspace disease, effusion, or pneumothorax. No acute bony abnormalities. IMPRESSION: 1. No complication after right internal jugular catheter placement. 2. No acute intrathoracic process. Electronically Signed   By: Randa Ngo M.D.   On: 12/16/2021 21:30   DG Chest 1 View  Result Date: 12/16/2021 CLINICAL DATA:  Evaluate Port-A-Cath position. EXAM: CHEST  1 VIEW COMPARISON:  Chest CT 04/14/2021 FINDINGS: The left IJ Port-A-Cath appears to be in stable  position when compared to the prior chest CT. It is directed slightly posteriorly but this is within the SVC on the chest CT and not in the azygous vein. IMPRESSION: Left IJ Port-A-Cath in stable position. Its tip is in the SVC based on the prior chest CT. Electronically Signed   By: Marijo Sanes M.D.   On: 12/16/2021 19:00   DG Chest 1 View  Result Date: 12/16/2021 CLINICAL DATA:  Abdominal pain. EXAM: CHEST  1 VIEW COMPARISON:  05/31/2020 and CT chest 04/14/2021. FINDINGS: Trachea is midline. Heart size normal. Left IJ Port-A-Cath tip may be directed posteriorly within the azygous vein. Lungs are clear. No pleural fluid. IMPRESSION: 1. No acute findings. 2. Left IJ Port-A-Cath tip may be directed posteriorly within the azygous vein. A lateral view would be helpful. Electronically Signed   By: Lorin Picket M.D.   On: 12/16/2021 13:21   IR NEPHROSTOMY PLACEMENT LEFT  Result Date: 12/17/2021 INDICATION: Urosepsis, hydronephrosis with encrusted internal ureteral stents EXAM: 1. Ultrasound-guided puncture of left renal calyx 2. Left antegrade nephrostogram through percutaneous access 3. Placement of a left percutaneous nephrostomy tube using fluoroscopic guidance 4. Ultrasound-guided puncture of right renal calyx 5. Right antegrade nephrostogram through percutaneous access 6. Placement of right percutaneous nephrostomy tube using fluoroscopic guidance COMPARISON:  None Available. MEDICATIONS: Per EMR ANESTHESIA/SEDATION: Moderate (conscious) sedation was employed during this procedure. A total of Versed 1 mg and Fentanyl 25 mcg was administered intravenously by the radiology nurse. Total intra-service moderate Sedation Time: 28 minutes. The patient's level of consciousness and vital signs were monitored continuously by radiology nursing throughout the procedure under my direct supervision. CONTRAST:  12 mL-administered into the collecting system(s) FLUOROSCOPY TIME:  Fluoroscopy Time: 4.1 minutes (17 mGy)  COMPLICATIONS: None immediate. PROCEDURE: Informed written consent was obtained from the patient after a thorough discussion of the procedural risks, benefits and alternatives. All questions were addressed. Maximal Sterile Barrier Technique was utilized including caps, mask, sterile gowns, sterile gloves, sterile drape, hand hygiene and skin antiseptic. A timeout was performed prior to the initiation of the procedure. The patient was placed prone on the exam table. The left flank was prepped and draped in the standard sterile fashion.  Ultrasound was used to evaluate the left kidney, which demonstrated hydronephrosis with echogenic urine concerning for pyonephrosis. Skin entry site was marked, and local analgesia was obtained with 1% lidocaine. Using ultrasound guidance, an appropriate lower pole posterior calyx was punctured using a 21-gauge Chiba needle. Entry into the collecting system was confirmed with return of purulent urine. An 018 wire was advanced through the needle into the renal pelvis and proximal ureter, followed by placement of a transition dilator. An antegrade nephrostogram was then performed, which demonstrated hydronephrosis with heterogeneous filling of the renal pelvis. Over an 035 Amplatz wire, the percutaneous tract was serially dilated, and a 10.2 French nephrostomy tube was advanced with the loop formed in the renal pelvis. Appropriate positioning of the nephrostomy tube was confirmed with injection of contrast material. The nephrostomy tube was secured to skin using silk suture and a dressing. It was attached to bag drainage. The patient was placed prone on the exam table. The right flank was prepped and draped in the standard sterile fashion. Ultrasound was used to evaluate the right kidney, which demonstrated hydronephrosis. Skin entry site was marked, and local analgesia was obtained with 1% lidocaine. Using ultrasound guidance, an appropriate lower pole posterior calyx was punctured  using a 21-gauge Chiba needle. Entry into the collecting system was confirmed with return of urine. An 018 wire was advanced through the needle into the renal pelvis and proximal ureter, followed by placement of a transition dilator. An antegrade nephrostogram was then performed, which demonstrated hydronephrosis. Over an 035 Amplatz wire, the percutaneous tract was serially dilated, and a 10.2 French nephrostomy tube was advanced with the loop formed in the renal pelvis. Appropriate positioning of the nephrostomy tube was confirmed with injection of contrast material. The nephrostomy tube was secured to skin using silk suture and a dressing. It was attached to bag drainage. The patient tolerated the procedure well without immediate complication. IMPRESSION: 1. Successful placement of 10.2 French left percutaneous nephrostomy tube using ultrasound and fluoroscopic guidance. A sample of grossly purulent urine was sent to the lab for microbiology analysis. 2. Successful placement of a 10.2 French right percutaneous nephrostomy tube using ultrasound and fluoroscopic guidance. 3. Nephrostomy tubes placed to bag drainage. 4. Patient to return to Interventional Radiology in 6 weeks for routine drain care. Further long-term management per Urology. Electronically Signed   By: Albin Felling M.D.   On: 12/17/2021 15:41   IR NEPHROSTOMY PLACEMENT RIGHT  Result Date: 12/17/2021 INDICATION: Urosepsis, hydronephrosis with encrusted internal ureteral stents EXAM: 1. Ultrasound-guided puncture of left renal calyx 2. Left antegrade nephrostogram through percutaneous access 3. Placement of a left percutaneous nephrostomy tube using fluoroscopic guidance 4. Ultrasound-guided puncture of right renal calyx 5. Right antegrade nephrostogram through percutaneous access 6. Placement of right percutaneous nephrostomy tube using fluoroscopic guidance COMPARISON:  None Available. MEDICATIONS: Per EMR ANESTHESIA/SEDATION: Moderate  (conscious) sedation was employed during this procedure. A total of Versed 1 mg and Fentanyl 25 mcg was administered intravenously by the radiology nurse. Total intra-service moderate Sedation Time: 28 minutes. The patient's level of consciousness and vital signs were monitored continuously by radiology nursing throughout the procedure under my direct supervision. CONTRAST:  12 mL-administered into the collecting system(s) FLUOROSCOPY TIME:  Fluoroscopy Time: 4.1 minutes (17 mGy) COMPLICATIONS: None immediate. PROCEDURE: Informed written consent was obtained from the patient after a thorough discussion of the procedural risks, benefits and alternatives. All questions were addressed. Maximal Sterile Barrier Technique was utilized including caps, mask, sterile gowns, sterile gloves,  sterile drape, hand hygiene and skin antiseptic. A timeout was performed prior to the initiation of the procedure. The patient was placed prone on the exam table. The left flank was prepped and draped in the standard sterile fashion. Ultrasound was used to evaluate the left kidney, which demonstrated hydronephrosis with echogenic urine concerning for pyonephrosis. Skin entry site was marked, and local analgesia was obtained with 1% lidocaine. Using ultrasound guidance, an appropriate lower pole posterior calyx was punctured using a 21-gauge Chiba needle. Entry into the collecting system was confirmed with return of purulent urine. An 018 wire was advanced through the needle into the renal pelvis and proximal ureter, followed by placement of a transition dilator. An antegrade nephrostogram was then performed, which demonstrated hydronephrosis with heterogeneous filling of the renal pelvis. Over an 035 Amplatz wire, the percutaneous tract was serially dilated, and a 10.2 French nephrostomy tube was advanced with the loop formed in the renal pelvis. Appropriate positioning of the nephrostomy tube was confirmed with injection of contrast  material. The nephrostomy tube was secured to skin using silk suture and a dressing. It was attached to bag drainage. The patient was placed prone on the exam table. The right flank was prepped and draped in the standard sterile fashion. Ultrasound was used to evaluate the right kidney, which demonstrated hydronephrosis. Skin entry site was marked, and local analgesia was obtained with 1% lidocaine. Using ultrasound guidance, an appropriate lower pole posterior calyx was punctured using a 21-gauge Chiba needle. Entry into the collecting system was confirmed with return of urine. An 018 wire was advanced through the needle into the renal pelvis and proximal ureter, followed by placement of a transition dilator. An antegrade nephrostogram was then performed, which demonstrated hydronephrosis. Over an 035 Amplatz wire, the percutaneous tract was serially dilated, and a 10.2 French nephrostomy tube was advanced with the loop formed in the renal pelvis. Appropriate positioning of the nephrostomy tube was confirmed with injection of contrast material. The nephrostomy tube was secured to skin using silk suture and a dressing. It was attached to bag drainage. The patient tolerated the procedure well without immediate complication. IMPRESSION: 1. Successful placement of 10.2 French left percutaneous nephrostomy tube using ultrasound and fluoroscopic guidance. A sample of grossly purulent urine was sent to the lab for microbiology analysis. 2. Successful placement of a 10.2 French right percutaneous nephrostomy tube using ultrasound and fluoroscopic guidance. 3. Nephrostomy tubes placed to bag drainage. 4. Patient to return to Interventional Radiology in 6 weeks for routine drain care. Further long-term management per Urology. Electronically Signed   By: Albin Felling M.D.   On: 12/17/2021 15:41        Scheduled Meds:  Chlorhexidine Gluconate Cloth  6 each Topical Q0600   feeding supplement  237 mL Oral TID BM    ipratropium-albuterol  3 mL Nebulization Q6H   multivitamin with minerals  1 tablet Oral Daily   pantoprazole  40 mg Oral BID   Continuous Infusions:  cefTRIAXone (ROCEPHIN)  IV 2 g (12/18/21 0998)   chlorproMAZINE (THORAZINE) IV 25 mg (12/17/21 2302)     LOS: 2 days    Time spent: 33 mins     Wyvonnia Dusky, MD [facility] Pager [phone]  If 7PM-7AM, please contact night-coverage 12/18/2021, 8:20 AM

## 2021-12-18 NOTE — Progress Notes (Signed)
At the beginning of the shift, arrived to patient room to do assessment and patient was drowsy and pale with a blood pressure of 70/30 with multiple IV fluids going.Nurse practioner arrived at bedside and levo sstarted. Patient's h and h came back at 6.9 and 21 and blood was ordered. Around 2100 consent was obtained from patient for the blood transfusion and blood was picked up from the lab around 2130. Charge nurse called room due to concern of patients ability to make decisions for herself about her treatment, even though she clarified at the beginning of the shift with the nurse practioner that she did not want cpr or the ventilator and wanted to remain a dnr, but would accept support care for her blood pressure. Nurse practitioner and charge nurse both present so blood could be returned to blood bank within the 30 minute time frame in case patient was refusing the blood. Pt did in fact want to be made comfort care. Daughter was called and came to see patient. Medications not made to make the patient comfortable were discontinued. Family visiting the patient. Patient remains stable and resting well through the night with IV pain medicine.

## 2021-12-18 NOTE — IPAL (Addendum)
  Interdisciplinary Goals of Care Family Meeting   Date carried out: 12/18/2021  Location of the meeting: Bedside  Member's involved: Nurse Practitioner and Bedside Registered Nurse  Durable Power of Attorney or acting medical decision maker: Anita Sullivan  Discussion: Advance Care Planning/Goals of Care discussion for  Anita Sullivan  was performed during the course of treatment to decide on type of care right for this patient following admission to the ICU.   I met with patient and her daughter to discuss goals of care in details following  change in patient's current status. Reviewed patient's worsening lab (Hemoglobin drop from 11.6~8.7~8.3~6.9, patient declined blood transfusion) vital signs including unstable HR and blood pressure requiring pressors  and overall poor prognosis with the family at the bedside and answered all their question.   Discussed prognosis, expected outcome with or without ongoing aggressive treatments and the options for de-escalation of care.   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 They have consented and agreed to DNR/DNI and would not wish to pursue any aggressive treatment.  Patient's family would like to proceed with full comfort care.    Family are satisfied with Plan of action and management. All questions answered  Code status: Full DNR  Disposition: In-patient comfort care  Time spent for the meeting: [place], DNP, CCRN, FNP-C, AGACNP-BC Acute Care & Family Nurse Practitioner  [facility]  See Amion for personal pager PCCM on call pager [phone] until 7 am

## 2021-12-18 NOTE — Consult Note (Signed)
[place]  Telephone:([phone]) [phone] Fax:(336) [phone]  ID: Anita Sullivan OB: 26-Jun-1966  MR#: [id number]  EQA#:[id number]  Patient Care Team: [name], NP as PCP - General (Nurse Practitioner) [name], MD as Consulting Physician (Oncology) [name], RN as Oncology Nurse Navigator  CHIEF COMPLAINT: Progressive stage IV colon cancer, sepsis, failure to thrive, acute renal failure.  INTERVAL HISTORY: Patient is a 56 year old female with known stage IV colon cancer who recently presented to the cancer center for routine evaluation.  She reported a significant decline in her performance status over the previous several weeks.  She was subsequently referred to the emergency room and then admitted to the ICU.  Patient noted to have obstructive uropathy and is status post stent placement as well as severe, progressive anemia.  Patient is lethargic and review of systems is difficult.  Recently, both patient and family agreed to pursue comfort care.  REVIEW OF SYSTEMS:   Review of Systems  Unable to perform ROS: Critical illness   PAST MEDICAL HISTORY: Past Medical History:  Diagnosis Date   Cancer associated pain    Colon cancer ([place])    Family history of pancreatic cancer    Family history of uterine cancer     PAST SURGICAL HISTORY: Past Surgical History:  Procedure Laterality Date   COLONOSCOPY WITH PROPOFOL N/A 05/27/2020   Procedure: COLONOSCOPY WITH PROPOFOL;  Surgeon: [name], MD;  Location: [facility] ENDOSCOPY;  Service: Gastroenterology;  Laterality: N/A;   FLEXIBLE SIGMOIDOSCOPY N/A 05/28/2020   Procedure: FLEXIBLE SIGMOIDOSCOPY;  Surgeon: [name], MD;  Location: [facility] ENDOSCOPY;  Service: Gastroenterology;  Laterality: N/A;   IR 3D INDEPENDENT WKST  06/09/2021   IR ANGIOGRAM SELECTIVE EACH ADDITIONAL VESSEL  06/09/2021   IR ANGIOGRAM SELECTIVE EACH ADDITIONAL VESSEL  06/09/2021   IR ANGIOGRAM SELECTIVE EACH  ADDITIONAL VESSEL  06/09/2021   IR ANGIOGRAM SELECTIVE EACH ADDITIONAL VESSEL  08/18/2021   IR ANGIOGRAM SELECTIVE EACH ADDITIONAL VESSEL  08/18/2021   IR ANGIOGRAM SELECTIVE EACH ADDITIONAL VESSEL  08/18/2021   IR ANGIOGRAM SELECTIVE EACH ADDITIONAL VESSEL  10/20/2021   IR ANGIOGRAM SELECTIVE EACH ADDITIONAL VESSEL  10/20/2021   IR ANGIOGRAM VISCERAL SELECTIVE  06/09/2021   IR ANGIOGRAM VISCERAL SELECTIVE  08/18/2021   IR ANGIOGRAM VISCERAL SELECTIVE  09/01/2021   IR ANGIOGRAM VISCERAL SELECTIVE  10/20/2021   IR EMBO TUMOR ORGAN ISCHEMIA INFARCT INC GUIDE ROADMAPPING  09/01/2021   IR EMBO TUMOR ORGAN ISCHEMIA INFARCT INC GUIDE ROADMAPPING  10/20/2021   IR NEPHROSTOMY EXCHANGE LEFT  07/11/2020   IR NEPHROSTOMY EXCHANGE RIGHT  07/11/2020   IR NEPHROSTOMY PLACEMENT LEFT  05/23/2020   IR NEPHROSTOMY PLACEMENT LEFT  12/17/2021   IR NEPHROSTOMY PLACEMENT RIGHT  05/23/2020   IR NEPHROSTOMY PLACEMENT RIGHT  12/17/2021   IR RADIOLOGIST EVAL & MGMT  04/28/2021   IR URETERAL STENT LEFT NEW ACCESS W/O SEP NEPHROSTOMY CATH  08/15/2020   IR URETERAL STENT RIGHT NEW ACCESS W/O SEP NEPHROSTOMY CATH  08/15/2020   IR US GUIDE VASC ACCESS RIGHT  06/09/2021   IR US GUIDE VASC ACCESS RIGHT  08/18/2021   IR US GUIDE VASC ACCESS RIGHT  09/01/2021   IR US GUIDE VASC ACCESS RIGHT  10/20/2021   PORTACATH PLACEMENT Left 05/29/2020   Procedure: INSERTION PORT-A-CATH;  Surgeon: [name], MD;  Location: [facility] ORS;  Service: General;  Laterality: Left;    FAMILY HISTORY: Family History  Problem Relation Age of Onset   Other Mother  Corticobasal degeneration   Uterine cancer Mother    Diabetes Maternal Grandmother    Stroke Maternal Grandfather    Stroke Paternal Grandfather    Heart disease Father    Pancreatic cancer Maternal Uncle 65   Cancer Paternal Aunt        unk type   Liver cancer Maternal Uncle    Pancreatic cancer Cousin    Cancer Cousin        unk type    ADVANCED DIRECTIVES (Y/N):   '@ADVDIR'$ @  HEALTH MAINTENANCE: Social History   Tobacco Use   Smoking status: Former    Types: Cigarettes   Smokeless tobacco: Never  Vaping Use   Vaping Use: Never used  Substance Use Topics   Alcohol use: Never   Drug use: Never     Colonoscopy:  PAP:  Bone density:  Lipid panel:  No Known Allergies  Current Facility-Administered Medications  Medication Dose Route Frequency Provider Last Rate Last Admin   acetaminophen (TYLENOL) tablet 650 mg  650 mg Oral Q6H PRN Ivor Costa, MD       antiseptic oral rinse (BIOTENE) solution 15 mL  15 mL Topical PRN Lang Snow, NP       cefTRIAXone (ROCEPHIN) 2 g in sodium chloride 0.9 % 100 mL IVPB  2 g Intravenous Q24H Lang Snow, NP   Stopped at 12/18/21 0842   Chlorhexidine Gluconate Cloth 2 % PADS 6 each  6 each Topical Q0600 Ivor Costa, MD   6 each at 12/18/21 0619   chlorproMAZINE (THORAZINE) 25 mg in sodium chloride 0.9 % 25 mL IVPB  25 mg Intravenous Q6H PRN Lang Snow, NP 50 mL/hr at 12/17/21 2302 25 mg at 12/17/21 2302   cyclobenzaprine (FLEXERIL) tablet 10 mg  10 mg Oral TID PRN Ivor Costa, MD       diphenhydrAMINE (BENADRYL) injection 12.5 mg  12.5 mg Intravenous Q8H PRN Ivor Costa, MD       feeding supplement (ENSURE ENLIVE / ENSURE PLUS) liquid 237 mL  237 mL Oral TID BM Wyvonnia Dusky, MD   237 mL at 12/18/21 1016   fentaNYL (SUBLIMAZE) injection 50 mcg  50 mcg Intravenous Q1H PRN Merlyn Lot, MD   50 mcg at 12/18/21 6767   glycopyrrolate (ROBINUL) tablet 1 mg  1 mg Oral Q4H PRN Lang Snow, NP       Or   glycopyrrolate (ROBINUL) injection 0.2 mg  0.2 mg Subcutaneous Q4H PRN Lang Snow, NP       Or   glycopyrrolate (ROBINUL) injection 0.2 mg  0.2 mg Intravenous Q4H PRN Lang Snow, NP       haloperidol (HALDOL) tablet 0.5 mg  0.5 mg Oral Q4H PRN Lang Snow, NP       Or   haloperidol (HALDOL) 2 MG/ML solution 0.5 mg  0.5 mg  Sublingual Q4H PRN Lang Snow, NP       Or   haloperidol lactate (HALDOL) injection 0.5 mg  0.5 mg Intravenous Q4H PRN Lang Snow, NP       HYDROcodone-acetaminophen (NORCO/VICODIN) 5-325 MG per tablet 1 tablet  1 tablet Oral Q6H PRN Ivor Costa, MD       ipratropium-albuterol (DUONEB) 0.5-2.5 (3) MG/3ML nebulizer solution 3 mL  3 mL Nebulization Q6H Ouma, Bing Neighbors, NP       loratadine (CLARITIN) tablet 10 mg  10 mg Oral Daily PRN Ivor Costa, MD       LORazepam (  ATIVAN) tablet 1 mg  1 mg Oral Q4H PRN Lang Snow, NP       Or   LORazepam (ATIVAN) 2 MG/ML concentrated solution 1 mg  1 mg Sublingual Q4H PRN Lang Snow, NP       Or   LORazepam (ATIVAN) injection 1 mg  1 mg Intravenous Q4H PRN Lang Snow, NP       morphine (PF) 2 MG/ML injection 1-2 mg  1-2 mg Intravenous Q1H PRN Lang Snow, NP   2 mg at 12/17/21 2250   multivitamin with minerals tablet 1 tablet  1 tablet Oral Daily Wyvonnia Dusky, MD   1 tablet at 12/18/21 1016   ondansetron (ZOFRAN) injection 4-8 mg  4-8 mg Intravenous Q6H PRN Flora Lipps, MD       pantoprazole (PROTONIX) EC tablet 40 mg  40 mg Oral BID Ivor Costa, MD   40 mg at 12/18/21 1016    OBJECTIVE: Vitals:   12/18/21 1000 12/18/21 1100  BP: (!) 90/59 (!) 93/59  Pulse: 92 (!) 105  Resp: 18 20  Temp:    SpO2: 100% 98%     Body mass index is 21.64 kg/m.    ECOG FS:4 - Bedbound  General: Ill-appearing eyes: Pink conjunctiva, anicteric sclera. HEENT: Normocephalic, moist mucous membranes. Lungs: No audible wheezing or coughing. Heart: Regular rate and rhythm. Abdomen: Soft, nontender, no obvious distention. Musculoskeletal: No edema, cyanosis, or clubbing. Neuro: Lethargic.  Cranial nerves grossly intact. Skin: No rashes or petechiae noted.  LAB RESULTS:  Lab Results  Component Value Date   NA 136 12/18/2021   K 4.0 12/18/2021   CL 107 12/18/2021   CO2 15 (L)  12/18/2021   GLUCOSE 88 12/18/2021   BUN 95 (H) 12/18/2021   CREATININE 5.29 (H) 12/18/2021   CALCIUM 7.2 (L) 12/18/2021   PROT 5.3 (L) 12/18/2021   ALBUMIN <1.5 (L) 12/18/2021   AST 18 12/18/2021   ALT 8 12/18/2021   ALKPHOS 133 (H) 12/18/2021   BILITOT 1.6 (H) 12/18/2021   GFRNONAA 9 (L) 12/18/2021    Lab Results  Component Value Date   WBC 22.2 (H) 12/18/2021   NEUTROABS 40.7 (H) 12/16/2021   HGB 7.7 (L) 12/18/2021   HCT 23.7 (L) 12/18/2021   MCV 91.5 12/18/2021   PLT 79 (L) 12/18/2021     STUDIES: CT ABDOMEN PELVIS WO CONTRAST  Result Date: 12/16/2021 CLINICAL DATA:  Abdominal pain, acute, nonlocalized. Metastatic colon cancer post Y 90 therapy 2 months ago. Patient reports no oral intake over the last 5 days. * Tracking Code: BO * EXAM: CT ABDOMEN AND PELVIS WITHOUT CONTRAST TECHNIQUE: Multidetector CT imaging of the abdomen and pelvis was performed following the standard protocol without IV contrast. RADIATION DOSE REDUCTION: This exam was performed according to the departmental dose-optimization program which includes automated exposure control, adjustment of the mA and/or kV according to patient size and/or use of iterative reconstruction technique. COMPARISON:  Abdominopelvic CT 07/31/2021. FINDINGS: Lower chest: Stable right lower lobe scarring. There are several new nodular densities at the left lung base, suspicious for metastatic disease. Representative nodules include a 4 mm nodule on image 6/3 and a 4 mm nodule on image 8/3. No significant pleural or pericardial effusion. Hepatobiliary: Suboptimal visualization of the previously demonstrated multifocal hepatic metastases on this noncontrast study. Lesions in the right lobe appears slightly smaller, measuring approximately 2.7 cm at the dome on image 9/2 and 2.2 cm inferiorly on image 29/2. A lesion in  the left lobe may be slightly larger, measuring approximately 2.7 cm on image 25/2 (previously 2.1 cm. No evidence of  gallstones, gallbladder wall thickening or biliary dilatation. Pancreas: Unremarkable. No pancreatic ductal dilatation or surrounding inflammatory changes. Spleen: Normal in size without focal abnormality. Adrenals/Urinary Tract: Both adrenal glands appear normal. Bilateral double-J ureteral stents remain in place with interval progressive bilateral hydronephrosis and mild perinephric soft tissue stranding. There is prominent encrustation in the urinary bladder, likely causing stent malfunction. No definite renal or ureteral calculi. Chronic right renal cortical thinning. Mild nonspecific bladder wall thickening. Stomach/Bowel: No enteric contrast was administered. The stomach appears unremarkable for its degree of distention. The small bowel, appendix and proximal colon appear normal. Progressive circumferential wall thickening throughout the rectosigmoid colon with progressive adjacent nodularity, suspicious for tumor recurrence and peritoneal carcinomatosis. There is continuity of gas between the rectal lumen and the vagina, highly suspicious for a rectovaginal fistula. No abnormal gas seen in the bladder. Vascular/Lymphatic: No discretely enlarged abdominopelvic lymph nodes identified. No acute vascular findings on noncontrast imaging. 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 As above, gas within the vagina communicates with the rectum, consistent with an underlying rectovaginal fistula. No adnexal mass. Other: Increased diffuse perirectal soft tissue nodularity suspicious for local recurrence. Small amount of pelvic ascites. No generalized ascites or peritoneal nodularity identified within the abdomen. Musculoskeletal: No acute or significant osseous findings. IMPRESSION: 1. Progressive pelvic soft tissue nodularity and nodular mural thickening of the rectosigmoid colon consistent with progressive local malignancy (colon cancer). 2. 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 3. New moderate bilateral hydronephrosis attributed to prominent ureteral stent encrustation in the bladder. The stents are likely nonfunctional, and replacement should be considered. 4. Known hepatic metastatic disease is not optimally visualized, but does not appear significantly progressive. 5. New nodularity at the left lung base, suspicious for progressive metastatic disease. Electronically Signed   By: Richardean Sale M.D.   On: 12/16/2021 16:04   DG Chest 1 View  Result Date: 12/16/2021 CLINICAL DATA:  Central line placement EXAM: CHEST  1 VIEW COMPARISON:  12/16/2021 FINDINGS: Single frontal view of the chest demonstrates stable left chest wall port tip within the superior vena cava. Right internal jugular catheter tip overlies superior vena cava. Cardiac silhouette is unremarkable. No airspace disease, effusion, or pneumothorax. No acute bony abnormalities. IMPRESSION: 1. No complication after right internal jugular catheter placement. 2. No acute intrathoracic process. Electronically Signed   By: Randa Ngo M.D.   On: 12/16/2021 21:30   DG Chest 1 View  Result Date: 12/16/2021 CLINICAL DATA:  Evaluate Port-A-Cath position. EXAM: CHEST  1 VIEW COMPARISON:  Chest CT 04/14/2021 FINDINGS: The left IJ Port-A-Cath appears to be in stable position when compared to the prior chest CT. It is directed slightly posteriorly but this is within the SVC on the chest CT and not in the azygous vein. IMPRESSION: Left IJ Port-A-Cath in stable position. Its tip is in the SVC based on the prior chest CT. Electronically Signed   By: Marijo Sanes M.D.   On: 12/16/2021 19:00   DG Chest 1 View  Result Date: 12/16/2021 CLINICAL DATA:  Abdominal pain. EXAM: CHEST  1 VIEW COMPARISON:  05/31/2020 and CT chest 04/14/2021. FINDINGS: Trachea is midline. Heart size normal. Left IJ Port-A-Cath tip may be directed posteriorly within the azygous vein. Lungs are clear. No pleural fluid. IMPRESSION: 1. No  acute findings. 2. Left IJ Port-A-Cath tip may  be directed posteriorly within the azygous vein. A lateral view would be helpful. Electronically Signed   By: Lorin Picket M.D.   On: 12/16/2021 13:21   IR NEPHROSTOMY PLACEMENT LEFT  Result Date: 12/17/2021 INDICATION: Urosepsis, hydronephrosis with encrusted internal ureteral stents EXAM: 1. Ultrasound-guided puncture of left renal calyx 2. Left antegrade nephrostogram through percutaneous access 3. Placement of a left percutaneous nephrostomy tube using fluoroscopic guidance 4. Ultrasound-guided puncture of right renal calyx 5. Right antegrade nephrostogram through percutaneous access 6. Placement of right percutaneous nephrostomy tube using fluoroscopic guidance COMPARISON:  None Available. MEDICATIONS: Per EMR ANESTHESIA/SEDATION: Moderate (conscious) sedation was employed during this procedure. A total of Versed 1 mg and Fentanyl 25 mcg was administered intravenously by the radiology nurse. Total intra-service moderate Sedation Time: 28 minutes. The patient's level of consciousness and vital signs were monitored continuously by radiology nursing throughout the procedure under my direct supervision. CONTRAST:  12 mL-administered into the collecting system(s) FLUOROSCOPY TIME:  Fluoroscopy Time: 4.1 minutes (17 mGy) COMPLICATIONS: None immediate. PROCEDURE: Informed written consent was obtained from the patient after a thorough discussion of the procedural risks, benefits and alternatives. All questions were addressed. Maximal Sterile Barrier Technique was utilized including caps, mask, sterile gowns, sterile gloves, sterile drape, hand hygiene and skin antiseptic. A timeout was performed prior to the initiation of the procedure. The patient was placed prone on the exam table. The left flank was prepped and draped in the standard sterile fashion. Ultrasound was used to evaluate the left kidney, which demonstrated hydronephrosis with echogenic urine concerning  for pyonephrosis. Skin entry site was marked, and local analgesia was obtained with 1% lidocaine. Using ultrasound guidance, an appropriate lower pole posterior calyx was punctured using a 21-gauge Chiba needle. Entry into the collecting system was confirmed with return of purulent urine. An 018 wire was advanced through the needle into the renal pelvis and proximal ureter, followed by placement of a transition dilator. An antegrade nephrostogram was then performed, which demonstrated hydronephrosis with heterogeneous filling of the renal pelvis. Over an 035 Amplatz wire, the percutaneous tract was serially dilated, and a 10.2 French nephrostomy tube was advanced with the loop formed in the renal pelvis. Appropriate positioning of the nephrostomy tube was confirmed with injection of contrast material. The nephrostomy tube was secured to skin using silk suture and a dressing. It was attached to bag drainage. The patient was placed prone on the exam table. The right flank was prepped and draped in the standard sterile fashion. Ultrasound was used to evaluate the right kidney, which demonstrated hydronephrosis. Skin entry site was marked, and local analgesia was obtained with 1% lidocaine. Using ultrasound guidance, an appropriate lower pole posterior calyx was punctured using a 21-gauge Chiba needle. Entry into the collecting system was confirmed with return of urine. An 018 wire was advanced through the needle into the renal pelvis and proximal ureter, followed by placement of a transition dilator. An antegrade nephrostogram was then performed, which demonstrated hydronephrosis. Over an 035 Amplatz wire, the percutaneous tract was serially dilated, and a 10.2 French nephrostomy tube was advanced with the loop formed in the renal pelvis. Appropriate positioning of the nephrostomy tube was confirmed with injection of contrast material. The nephrostomy tube was secured to skin using silk suture and a dressing. It was  attached to bag drainage. The patient tolerated the procedure well without immediate complication. IMPRESSION: 1. Successful placement of 10.2 French left percutaneous nephrostomy tube using ultrasound and fluoroscopic guidance. A sample of grossly  purulent urine was sent to the lab for microbiology analysis. 2. Successful placement of a 10.2 French right percutaneous nephrostomy tube using ultrasound and fluoroscopic guidance. 3. Nephrostomy tubes placed to bag drainage. 4. Patient to return to Interventional Radiology in 6 weeks for routine drain care. Further long-term management per Urology. Electronically Signed   By: Albin Felling M.D.   On: 12/17/2021 15:41   IR NEPHROSTOMY PLACEMENT RIGHT  Result Date: 12/17/2021 INDICATION: Urosepsis, hydronephrosis with encrusted internal ureteral stents EXAM: 1. Ultrasound-guided puncture of left renal calyx 2. Left antegrade nephrostogram through percutaneous access 3. Placement of a left percutaneous nephrostomy tube using fluoroscopic guidance 4. Ultrasound-guided puncture of right renal calyx 5. Right antegrade nephrostogram through percutaneous access 6. Placement of right percutaneous nephrostomy tube using fluoroscopic guidance COMPARISON:  None Available. MEDICATIONS: Per EMR ANESTHESIA/SEDATION: Moderate (conscious) sedation was employed during this procedure. A total of Versed 1 mg and Fentanyl 25 mcg was administered intravenously by the radiology nurse. Total intra-service moderate Sedation Time: 28 minutes. The patient's level of consciousness and vital signs were monitored continuously by radiology nursing throughout the procedure under my direct supervision. CONTRAST:  12 mL-administered into the collecting system(s) FLUOROSCOPY TIME:  Fluoroscopy Time: 4.1 minutes (17 mGy) COMPLICATIONS: None immediate. PROCEDURE: Informed written consent was obtained from the patient after a thorough discussion of the procedural risks, benefits and alternatives. All  questions were addressed. Maximal Sterile Barrier Technique was utilized including caps, mask, sterile gowns, sterile gloves, sterile drape, hand hygiene and skin antiseptic. A timeout was performed prior to the initiation of the procedure. The patient was placed prone on the exam table. The left flank was prepped and draped in the standard sterile fashion. Ultrasound was used to evaluate the left kidney, which demonstrated hydronephrosis with echogenic urine concerning for pyonephrosis. Skin entry site was marked, and local analgesia was obtained with 1% lidocaine. Using ultrasound guidance, an appropriate lower pole posterior calyx was punctured using a 21-gauge Chiba needle. Entry into the collecting system was confirmed with return of purulent urine. An 018 wire was advanced through the needle into the renal pelvis and proximal ureter, followed by placement of a transition dilator. An antegrade nephrostogram was then performed, which demonstrated hydronephrosis with heterogeneous filling of the renal pelvis. Over an 035 Amplatz wire, the percutaneous tract was serially dilated, and a 10.2 French nephrostomy tube was advanced with the loop formed in the renal pelvis. Appropriate positioning of the nephrostomy tube was confirmed with injection of contrast material. The nephrostomy tube was secured to skin using silk suture and a dressing. It was attached to bag drainage. The patient was placed prone on the exam table. The right flank was prepped and draped in the standard sterile fashion. Ultrasound was used to evaluate the right kidney, which demonstrated hydronephrosis. Skin entry site was marked, and local analgesia was obtained with 1% lidocaine. Using ultrasound guidance, an appropriate lower pole posterior calyx was punctured using a 21-gauge Chiba needle. Entry into the collecting system was confirmed with return of urine. An 018 wire was advanced through the needle into the renal pelvis and proximal  ureter, followed by placement of a transition dilator. An antegrade nephrostogram was then performed, which demonstrated hydronephrosis. Over an 035 Amplatz wire, the percutaneous tract was serially dilated, and a 10.2 French nephrostomy tube was advanced with the loop formed in the renal pelvis. Appropriate positioning of the nephrostomy tube was confirmed with injection of contrast material. The nephrostomy tube was secured to skin using silk  suture and a dressing. It was attached to bag drainage. The patient tolerated the procedure well without immediate complication. IMPRESSION: 1. Successful placement of 10.2 French left percutaneous nephrostomy tube using ultrasound and fluoroscopic guidance. A sample of grossly purulent urine was sent to the lab for microbiology analysis. 2. Successful placement of a 10.2 French right percutaneous nephrostomy tube using ultrasound and fluoroscopic guidance. 3. Nephrostomy tubes placed to bag drainage. 4. Patient to return to Interventional Radiology in 6 weeks for routine drain care. Further long-term management per Urology. Electronically Signed   By: Albin Felling M.D.   On: 12/17/2021 15:41    ASSESSMENT: Progressive stage IV colon cancer, sepsis, failure to thrive, acute renal failure.  PLAN:    Progressive stage IV colon cancer: No further treatments are planned.  Patient has elected to pursue hospice and comfort care. Acute renal failure: Secondary to obstructive uropathy.  Patient has had bilateral percutaneous nephrostomy tubes placed on Dec 17, 2021 with only minimal improvement of her creatinine. Progressive anemia: Patient has declined any further blood transfusions. Failure to thrive/declining performance status: Agree with comfort care and hospice.  Appreciate consult, will follow.   [name], MD   12/18/2021 11:54 AM

## 2021-12-18 NOTE — Progress Notes (Signed)
[place] [facility]) Hospital Liaison Note   Received request from Transitions of Care Manager, Caryl Pina, for hospice services at home after discharge. Chart and patient information under review by [facility] physician. Hospice eligibility pending.   Spoke with daughter/[place] to initiate education related to hospice philosophy, services, and team approach to care. She verbalized understanding of information given. Per discussion, the plan is for patient to discharge home via [facility] once cleared to DC.    DME needs discussed. Patient has the following equipment in the home: N/A Patient requests the following equipment for delivery: Hospital Bed O2--2L Bedside Table  Address verified and is correct in the chart. [name]  is the family member to contact to arrange time of equipment delivery.    Please send signed and completed DNR home with patient/family. Please provide prescriptions at discharge as needed to ensure ongoing symptom management.    [facility] information and contact numbers given to family & above information shared with TOC.   Please call with any questions/concerns.    Thank you for the opportunity to participate in this patient's care.   Daphene Calamity, MSW [facility] Liaison  [phone]

## 2021-12-19 DIAGNOSIS — R7881 Bacteremia: Secondary | ICD-10-CM | POA: Diagnosis not present

## 2021-12-19 DIAGNOSIS — N1831 Chronic kidney disease, stage 3a: Secondary | ICD-10-CM | POA: Diagnosis not present

## 2021-12-19 DIAGNOSIS — R627 Adult failure to thrive: Secondary | ICD-10-CM | POA: Diagnosis not present

## 2021-12-19 DIAGNOSIS — N179 Acute kidney failure, unspecified: Secondary | ICD-10-CM | POA: Diagnosis not present

## 2021-12-19 LAB — CBC
HCT: 23.5 % — ABNORMAL LOW (ref 36.0–46.0)
Hemoglobin: 7.5 g/dL — ABNORMAL LOW (ref 12.0–15.0)
MCH: 29.3 pg (ref 26.0–34.0)
MCHC: 31.9 g/dL (ref 30.0–36.0)
MCV: 91.8 fL (ref 80.0–100.0)
Platelets: 59 10*3/uL — ABNORMAL LOW (ref 150–400)
RBC: 2.56 MIL/uL — ABNORMAL LOW (ref 3.87–5.11)
RDW: 19.4 % — ABNORMAL HIGH (ref 11.5–15.5)
WBC: 15.1 10*3/uL — ABNORMAL HIGH (ref 4.0–10.5)
nRBC: 0 % (ref 0.0–0.2)

## 2021-12-19 LAB — COMPREHENSIVE METABOLIC PANEL
ALT: 11 U/L (ref 0–44)
AST: 22 U/L (ref 15–41)
Albumin: <1.5 g/dL — ABNORMAL LOW (ref 3.5–5.0)
Alkaline Phosphatase: 167 U/L — ABNORMAL HIGH (ref 38–126)
Anion gap: 12 (ref 5–15)
BUN: 96 mg/dL — ABNORMAL HIGH (ref 6–20)
CO2: 17 mmol/L — ABNORMAL LOW (ref 22–32)
Calcium: 7.3 mg/dL — ABNORMAL LOW (ref 8.9–10.3)
Chloride: 104 mmol/L (ref 98–111)
Creatinine, Ser: 4.4 mg/dL — ABNORMAL HIGH (ref 0.44–1.00)
GFR, Estimated: 11 mL/min — ABNORMAL LOW (ref >60)
Glucose, Bld: 113 mg/dL — ABNORMAL HIGH (ref 70–99)
Potassium: 3.3 mmol/L — ABNORMAL LOW (ref 3.5–5.1)
Sodium: 133 mmol/L — ABNORMAL LOW (ref 135–145)
Total Bilirubin: 1.2 mg/dL (ref 0.3–1.2)
Total Protein: 5.4 g/dL — ABNORMAL LOW (ref 6.5–8.1)

## 2021-12-19 LAB — GLUCOSE, CAPILLARY
Glucose-Capillary: 114 mg/dL — ABNORMAL HIGH (ref 70–99)
Glucose-Capillary: 138 mg/dL — ABNORMAL HIGH (ref 70–99)

## 2021-12-19 LAB — CULTURE, BLOOD (ROUTINE X 2): Special Requests: ADEQUATE

## 2021-12-19 LAB — URINE CULTURE: Culture: >=100000 — AB

## 2021-12-19 NOTE — TOC Progression Note (Signed)
Transition of Care (TOC) - Progression Note    Patient Details  Name: Anita Sullivan MRN: [id number] Date of Birth: 12/24/1965  Transition of Care (TOC) CM/SW Contact  ,  Michelle, LCSW Phone Number: 12/19/2021, 1:05 PM  Clinical Narrative:     Met with patient's daughter at bedside. Daughter is requesting inpatient hospice for patient. Per daughter, her home is under construction and patient's boyfriend is not able to provide care. Hospice Liaison Misty Green contacted, she will review and update this social worker.    , LCSW Transition of Care [phone]     Expected Discharge Plan and Services                                                 Social Determinants of Health (SDOH) Interventions    Readmission Risk Interventions     View : No data to display.          

## 2021-12-19 NOTE — Plan of Care (Signed)
  Problem: Education: Goal: Knowledge of General Education information will improve Description: Including pain rating scale, medication(s)/side effects and non-pharmacologic comfort measures Outcome: Progressing   Problem: Health Behavior/Discharge Planning: Goal: Ability to manage health-related needs will improve Outcome: Progressing   Problem: Clinical Measurements: Goal: Ability to maintain clinical measurements within normal limits will improve Outcome: Progressing   Problem: Clinical Measurements: Goal: Will remain free from infection Outcome: Progressing   Problem: Clinical Measurements: Goal: Diagnostic test results will improve Outcome: Progressing   Problem: Clinical Measurements: Goal: Respiratory complications will improve Outcome: Progressing   Problem: Clinical Measurements: Goal: Cardiovascular complication will be avoided Outcome: Progressing   Problem: Activity: Goal: Risk for activity intolerance will decrease Outcome: Progressing   Problem: Nutrition: Goal: Adequate nutrition will be maintained Outcome: Progressing   Problem: Coping: Goal: Level of anxiety will decrease Outcome: Progressing   Problem: Elimination: Goal: Will not experience complications related to bowel motility Outcome: Progressing   Problem: Elimination: Goal: Will not experience complications related to urinary retention Outcome: Progressing   Problem: Pain Managment: Goal: General experience of comfort will improve Outcome: Progressing   Problem: Safety: Goal: Ability to remain free from injury will improve Outcome: Progressing   Problem: Skin Integrity: Goal: Risk for impaired skin integrity will decrease Outcome: Progressing   

## 2021-12-19 NOTE — TOC Progression Note (Signed)
Transition of Care [facility]) - Progression Note    Patient Details  Name: Anita Sullivan MRN: [id number] Date of Birth: 12-11-1965  Transition of Care [facility]) CM/SW Contact  [address], [place], [place] Phone Number: 12/19/2021, 9:38 AM  Clinical Narrative:    Follow up phone call to patient's daughter Bartlett. Patient will be discharging home with hospice. DME ordered but has not been delivered  yet. Hospice liaison Misty Green aware and will follow up with patient's DME needs.   [address], LCSW Transition of Care [phone]         Expected Discharge Plan and Services                                                 Social Determinants of Health (SDOH) Interventions    Readmission Risk Interventions     View : No data to display.

## 2021-12-19 NOTE — Progress Notes (Signed)
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 Continue w/ comfort care only. Pt's daughter is now requesting inpatient hospice for the pt   Obstructive uropathy: w/ b/l hydronephrosis. S/p b/l ureteral stents on 08/15/20 but CT scan shows likely nonfunctional stents. S/p placement of b/l percutaneous nephrostomy tubes 12/17/21 as per IR.   AKI on CKDIIIb: no more labs as pt is comfort care only    Sepsis: met criteria w/ leukocytosis, tachycardia, tachypnea & likely UTI & bacteremia.  Urine cx growing e. coli  Bacteremia: blood cxs growing e. coli, sens pending. Likely secondary to UTI. Continue on IV rocephin  Metabolic acidosis: comfort care only   Leukocytosis: secondary to above infection. Trending down.   Hyperkalemia: resolved  Hyponatremia: resolved   Colorectal cancer w/ mets: stage IV as per pt's daughter. S/p chemo, radiation. Declined to have any further treatment. 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 Has been pooping blood out as per pt's daughter. Declined blood transfusions. Continue w/ comfort care   Hyperbilirubinemia: etiology unclear. Continue w/ comfort care only   Severe malnutrition: likely secondary to colorectal cancer. Continue w/ comfort care only   DVT prophylaxis: comfort care  Code Status: DNR Family  Communication: discussed pt's care w/ pt's daughter, [name], and answered her questions  Disposition Plan: comfort care only, family requesting inpatient hospice   Level of care: Med-Surg  Status is: Inpatient Remains inpatient appropriate because: pt's family is requesting inpatient hospice for the pt     Consultants:  Urology IR  Nephro   Procedures:   Antimicrobials: rocephin    Subjective: Pt c/o fatigue   Objective: Vitals:   12/18/21 1600 12/18/21 1700 12/18/21 1737 12/18/21 1949  BP: 92/60 93/63 94/67  104/67  Pulse: (!) 105 (!) 106 98 86  Resp: 19 19  16   Temp:   (!) 97.5 F (36.4 C) 98.4 F (36.9 C)  TempSrc:      SpO2: 97% 97% 100% 100%  Weight:      Height:        Intake/Output Summary (Last 24 hours) at 12/19/2021 0727 Last data filed at 12/19/2021 0454 Gross per 24 hour  Intake 300 ml  Output 1580 ml  Net -1280 ml   Filed Weights   12/16/21 1905  Weight: 55.4 kg    Examination:  General exam: Appears anxious   Respiratory system: decreased breath sounds b/l  Cardiovascular system: S1 & S2+. No rubs or gallops Gastrointestinal system: Abd is soft, tenderness to palpation, hypoactive bowel sounds  Central nervous system: Moves all extremities  Psychiatry: judgement and insight appears not a baseline. Anxious mood and affect     Data Reviewed: I have personally reviewed following labs and imaging studies  CBC: Recent Labs  Lab 12/16/21 1320 12/17/21 0425 12/17/21 0909 12/17/21 1839 12/18/21 0759 12/19/21 0522  WBC 43.9* 38.0*  --   --  22.2* 15.1*  NEUTROABS 40.7*  --   --   --   --   --   HGB 11.6* 8.7* 8.3* 6.9* 7.7* 7.5*  HCT 38.1 27.4*  --  21.2* 23.7* 23.5*  MCV 97.9 93.8  --   --  91.5 91.8  PLT 215 166  --   --  79* 59*   Basic Metabolic Panel: Recent Labs  Lab 12/16/21 1402 12/16/21 2121 12/17/21 0425 12/18/21 0640 12/19/21 0522  NA 129* 133* 133* 136 133*  K 5.2* 4.5 4.7 4.0 3.3*  CL 102 109 109 107 104  CO2  10* 10* 11* 15* 17*  GLUCOSE 96 92 77 88 113*  BUN QUANTITY NOT SUFFICIENT, UNABLE TO PERFORM TEST 109* 111* 95* 96*  CREATININE 7.48* 6.75* 7.12* 5.29* 4.40*  CALCIUM 7.5* 6.9* 7.1* 7.2* 7.3*  MG  --   --  2.7*  --   --    GFR: Estimated Creatinine Clearance: 12.2 mL/min (A) (by C-G formula based on SCr of 4.4 mg/dL (H)). Liver Function Tests: Recent Labs  Lab 12/16/21 1402 12/18/21 0640 12/19/21 0522  AST 15 18 22   ALT 8 8 11   ALKPHOS 164* 133* 167*  BILITOT 1.6* 1.6* 1.2  PROT 7.4 5.3* 5.4*  ALBUMIN 2.1* <1.5* <1.5*   No results for input(s): LIPASE, AMYLASE in the last 168 hours. No results for input(s): AMMONIA in the last 168 hours. Coagulation Profile: Recent Labs  Lab 12/16/21 1357  INR 1.7*   Cardiac Enzymes: No results for input(s): CKTOTAL, CKMB, CKMBINDEX, TROPONINI in the last 168 hours. BNP (last 3 results) No results for input(s): PROBNP in the last 8760 hours. HbA1C: No results for input(s): HGBA1C in the last 72 hours. CBG: Recent Labs  Lab 12/17/21 0911 12/17/21 1248 12/17/21 1559 12/17/21 1954 12/18/21 0751  GLUCAP 155* 84 100* 84 95   Lipid Profile: No results for input(s): CHOL, HDL, LDLCALC, TRIG, CHOLHDL, LDLDIRECT in the last 72 hours. Thyroid Function Tests: Recent Labs    12/17/21 0136  TSH 1.066   Anemia Panel: No results for input(s): VITAMINB12, FOLATE, FERRITIN, TIBC, IRON, RETICCTPCT in the last 72 hours. Sepsis Labs: Recent Labs  Lab 12/16/21 1357 12/16/21 2121  PROCALCITON  --  3.90  LATICACIDVEN 1.7 0.9    Recent Results (from the past 240 hour(s))  Culture, blood (Routine x 2)     Status: None (Preliminary result)   Collection Time: 12/16/21  1:57 PM   Specimen: BLOOD  Result Value Ref Range Status   Specimen Description   Final    BLOOD LEFT [facility] Performed at [facility], [address] [place], [place] [address]    Special Requests   Final    BOTTLES DRAWN AEROBIC AND ANAEROBIC Blood Culture  adequate volume Performed at [facility], [address], [place], [place] [address]    Culture  Setup Time   Final    GRAM NEGATIVE RODS IN BOTH AEROBIC AND ANAEROBIC BOTTLES CRITICAL VALUE NOTED.  VALUE IS CONSISTENT WITH PREVIOUSLY REPORTED AND CALLED VALUE. GRAM STAIN REVIEWED-AGREE WITH RESULT    Culture   Final    GRAM NEGATIVE RODS IDENTIFICATION TO FOLLOW Performed at [facility], [place] [address], [place], [place] [address]    Report Status PENDING  Incomplete  Culture, blood (Routine x 2)     Status: Abnormal (Preliminary result)   Collection Time: 12/16/21  2:02 PM   Specimen: BLOOD  Result Value Ref Range Status  Specimen Description   Final    BLOOD RIGHT ANTECUBITAL Performed at [facility], [address] [place], [place] [address]    Special Requests   Final    BOTTLES DRAWN AEROBIC AND ANAEROBIC Blood Culture results may not be optimal due to an inadequate volume of blood received in culture bottles Performed at [facility], [address] [place], [place] [address]    Culture  Setup Time   Final    Organism ID to follow IN BOTH AEROBIC AND ANAEROBIC BOTTLES GRAM NEGATIVE RODS CRITICAL RESULT CALLED TO, READ BACK BY AND VERIFIED WITH: NATHAN BELUTH @ 1856 ON 10/17/2021.Marland KitchenMarland KitchenTKR GRAM STAIN REVIEWED-AGREE WITH RESULT    Culture (A)  Final    ESCHERICHIA COLI SUSCEPTIBILITIES TO FOLLOW Performed at [facility], [place] [address], [place], [place] [address]    Report Status PENDING  Incomplete  Blood Culture ID Panel (Reflexed)     Status: Abnormal   Collection Time: 12/16/21  2:02 PM  Result Value Ref Range Status   Enterococcus faecalis NOT DETECTED NOT DETECTED Final   Enterococcus Faecium NOT DETECTED NOT DETECTED Final   Listeria monocytogenes NOT DETECTED NOT DETECTED Final   Staphylococcus species NOT DETECTED NOT DETECTED Final   Staphylococcus aureus (BCID) NOT DETECTED NOT DETECTED Final   Staphylococcus  epidermidis NOT DETECTED NOT DETECTED Final   Staphylococcus lugdunensis NOT DETECTED NOT DETECTED Final   Streptococcus species NOT DETECTED NOT DETECTED Final   Streptococcus agalactiae NOT DETECTED NOT DETECTED Final   Streptococcus pneumoniae NOT DETECTED NOT DETECTED Final   Streptococcus pyogenes NOT DETECTED NOT DETECTED Final   A.calcoaceticus-baumannii NOT DETECTED NOT DETECTED Final   Bacteroides fragilis NOT DETECTED NOT DETECTED Final   Enterobacterales DETECTED (A) NOT DETECTED Final    Comment: Enterobacterales represent a large order of gram negative bacteria, not a single organism. CRITICAL RESULT CALLED TO, READ BACK BY AND VERIFIED WITH: NATHAN BELUTH @ 0263 ON 10/17/2021.Marland KitchenMarland KitchenTKR    Enterobacter cloacae complex NOT DETECTED NOT DETECTED Final   Escherichia coli DETECTED (A) NOT DETECTED Final    Comment: CRITICAL RESULT CALLED TO, READ BACK BY AND VERIFIED WITH: NATHAN BELUTH @ 7858 ON 10/17/2021.Marland KitchenMarland KitchenTKR    Klebsiella aerogenes NOT DETECTED NOT DETECTED Final   Klebsiella oxytoca NOT DETECTED NOT DETECTED Final   Klebsiella pneumoniae NOT DETECTED NOT DETECTED Final   Proteus species NOT DETECTED NOT DETECTED Final   Salmonella species NOT DETECTED NOT DETECTED Final   Serratia marcescens NOT DETECTED NOT DETECTED Final   Haemophilus influenzae NOT DETECTED NOT DETECTED Final   Neisseria meningitidis NOT DETECTED NOT DETECTED Final   Pseudomonas aeruginosa NOT DETECTED NOT DETECTED Final   Stenotrophomonas maltophilia NOT DETECTED NOT DETECTED Final   Candida albicans NOT DETECTED NOT DETECTED Final   Candida auris NOT DETECTED NOT DETECTED Final   Candida glabrata NOT DETECTED NOT DETECTED Final   Candida krusei NOT DETECTED NOT DETECTED Final   Candida parapsilosis NOT DETECTED NOT DETECTED Final   Candida tropicalis NOT DETECTED NOT DETECTED Final   Cryptococcus neoformans/gattii NOT DETECTED NOT DETECTED Final   CTX-M ESBL NOT DETECTED NOT DETECTED Final    Carbapenem resistance IMP NOT DETECTED NOT DETECTED Final   Carbapenem resistance KPC NOT DETECTED NOT DETECTED Final   Carbapenem resistance NDM NOT DETECTED NOT DETECTED Final   Carbapenem resist OXA 48 LIKE NOT DETECTED NOT DETECTED Final   Carbapenem resistance VIM NOT DETECTED NOT DETECTED Final    Comment: Performed at [employer]  [facility], [address], [place], [place] [address]  MRSA Next Gen by PCR, Nasal     Status: None   Collection Time: 12/16/21  7:09 PM   Specimen: Nasal Mucosa; Nasal Swab  Result Value Ref Range Status   MRSA by PCR Next Gen NOT DETECTED NOT DETECTED Final    Comment: (NOTE) The GeneXpert MRSA Assay (FDA approved for NASAL specimens only), is one component of a comprehensive MRSA colonization surveillance program. It is not intended to diagnose MRSA infection nor to guide or monitor treatment for MRSA infections. Test performance is not FDA approved in patients less than 65 years old. Performed at [facility], [address] [place], [place] [address]   Urine Culture     Status: Abnormal   Collection Time: 12/16/21  9:21 PM   Specimen: Urine, Random  Result Value Ref Range Status   Specimen Description   Final    URINE, RANDOM Performed at [facility], [address] [place], [place] [address]    Special Requests   Final    NONE Performed at [facility], [address] [place], [place] [address]    Culture >=100,000 COLONIES/mL ESCHERICHIA COLI (A)  Final   Report Status 12/19/2021 FINAL  Final   Organism ID, Bacteria ESCHERICHIA COLI (A)  Final      Susceptibility   Escherichia coli - MIC*    AMPICILLIN <=2 SENSITIVE Sensitive     CEFAZOLIN <=4 SENSITIVE Sensitive     CEFEPIME <=0.12 SENSITIVE Sensitive     CEFTRIAXONE <=0.25 SENSITIVE Sensitive     CIPROFLOXACIN <=0.25 SENSITIVE Sensitive     GENTAMICIN <=1 SENSITIVE Sensitive     IMIPENEM <=0.25 SENSITIVE Sensitive     NITROFURANTOIN <=16 SENSITIVE  Sensitive     TRIMETH/SULFA <=20 SENSITIVE Sensitive     AMPICILLIN/SULBACTAM <=2 SENSITIVE Sensitive     PIP/TAZO <=4 SENSITIVE Sensitive     * >=100,000 COLONIES/mL ESCHERICHIA COLI  Urine Culture     Status: None (Preliminary result)   Collection Time: 12/17/21 12:04 PM   Specimen: Urine, Catheterized  Result Value Ref Range Status   Specimen Description URINE, CATHETERIZED  Final   Special Requests   Final    LEFT KIDNEY PCN DRAIN Performed at [facility], [place] [address] [place], [place] [address]    Culture PENDING  Incomplete   Report Status PENDING  Incomplete         Radiology Studies: IR NEPHROSTOMY PLACEMENT LEFT  Result Date: 12/17/2021 INDICATION: Urosepsis, hydronephrosis with encrusted internal ureteral stents EXAM: 1. Ultrasound-guided puncture of left renal calyx 2. Left antegrade nephrostogram through percutaneous access 3. Placement of a left percutaneous nephrostomy tube using fluoroscopic guidance 4. Ultrasound-guided puncture of right renal calyx 5. Right antegrade nephrostogram through percutaneous access 6. Placement of right percutaneous nephrostomy tube using fluoroscopic guidance COMPARISON:  None Available. MEDICATIONS: Per EMR ANESTHESIA/SEDATION: Moderate (conscious) sedation was employed during this procedure. A total of Versed 1 mg and Fentanyl 25 mcg was administered intravenously by the radiology nurse. Total intra-service moderate Sedation Time: 28 minutes. The patient's level of consciousness and vital signs were monitored continuously by radiology nursing throughout the procedure under my direct supervision. CONTRAST:  12 mL-administered into the collecting system(s) FLUOROSCOPY TIME:  Fluoroscopy Time: 4.1 minutes (17 mGy) COMPLICATIONS: None immediate. PROCEDURE: Informed written consent was obtained from the patient after a thorough discussion of the procedural risks, benefits and alternatives. All questions were addressed. Maximal Sterile  Barrier  Technique was utilized including caps, mask, sterile gowns, sterile gloves, sterile drape, hand hygiene and skin antiseptic. A timeout was performed prior to the initiation of the procedure. The patient was placed prone on the exam table. The left flank was prepped and draped in the standard sterile fashion. Ultrasound was used to evaluate the left kidney, which demonstrated hydronephrosis with echogenic urine concerning for pyonephrosis. Skin entry site was marked, and local analgesia was obtained with 1% lidocaine. Using ultrasound guidance, an appropriate lower pole posterior calyx was punctured using a 21-gauge Chiba needle. Entry into the collecting system was confirmed with return of purulent urine. An 018 wire was advanced through the needle into the renal pelvis and proximal ureter, followed by placement of a transition dilator. An antegrade nephrostogram was then performed, which demonstrated hydronephrosis with heterogeneous filling of the renal pelvis. Over an 035 Amplatz wire, the percutaneous tract was serially dilated, and a 10.2 French nephrostomy tube was advanced with the loop formed in the renal pelvis. Appropriate positioning of the nephrostomy tube was confirmed with injection of contrast material. The nephrostomy tube was secured to skin using silk suture and a dressing. It was attached to bag drainage. The patient was placed prone on the exam table. The right flank was prepped and draped in the standard sterile fashion. Ultrasound was used to evaluate the right kidney, which demonstrated hydronephrosis. Skin entry site was marked, and local analgesia was obtained with 1% lidocaine. Using ultrasound guidance, an appropriate lower pole posterior calyx was punctured using a 21-gauge Chiba needle. Entry into the collecting system was confirmed with return of urine. An 018 wire was advanced through the needle into the renal pelvis and proximal ureter, followed by placement of a transition  dilator. An antegrade nephrostogram was then performed, which demonstrated hydronephrosis. Over an 035 Amplatz wire, the percutaneous tract was serially dilated, and a 10.2 French nephrostomy tube was advanced with the loop formed in the renal pelvis. Appropriate positioning of the nephrostomy tube was confirmed with injection of contrast material. The nephrostomy tube was secured to skin using silk suture and a dressing. It was attached to bag drainage. The patient tolerated the procedure well without immediate complication. IMPRESSION: 1. Successful placement of 10.2 French left percutaneous nephrostomy tube using ultrasound and fluoroscopic guidance. A sample of grossly purulent urine was sent to the lab for microbiology analysis. 2. Successful placement of a 10.2 French right percutaneous nephrostomy tube using ultrasound and fluoroscopic guidance. 3. Nephrostomy tubes placed to bag drainage. 4. Patient to return to Interventional Radiology in 6 weeks for routine drain care. Further long-term management per Urology. Electronically Signed   By: Albin Felling M.D.   On: 12/17/2021 15:41   IR NEPHROSTOMY PLACEMENT RIGHT  Result Date: 12/17/2021 INDICATION: Urosepsis, hydronephrosis with encrusted internal ureteral stents EXAM: 1. Ultrasound-guided puncture of left renal calyx 2. Left antegrade nephrostogram through percutaneous access 3. Placement of a left percutaneous nephrostomy tube using fluoroscopic guidance 4. Ultrasound-guided puncture of right renal calyx 5. Right antegrade nephrostogram through percutaneous access 6. Placement of right percutaneous nephrostomy tube using fluoroscopic guidance COMPARISON:  None Available. MEDICATIONS: Per EMR ANESTHESIA/SEDATION: Moderate (conscious) sedation was employed during this procedure. A total of Versed 1 mg and Fentanyl 25 mcg was administered intravenously by the radiology nurse. Total intra-service moderate Sedation Time: 28 minutes. The patient's level of  consciousness and vital signs were monitored continuously by radiology nursing throughout the procedure under my direct supervision. CONTRAST:  12 mL-administered into the collecting system(s)  FLUOROSCOPY TIME:  Fluoroscopy Time: 4.1 minutes (17 mGy) COMPLICATIONS: None immediate. PROCEDURE: Informed written consent was obtained from the patient after a thorough discussion of the procedural risks, benefits and alternatives. All questions were addressed. Maximal Sterile Barrier Technique was utilized including caps, mask, sterile gowns, sterile gloves, sterile drape, hand hygiene and skin antiseptic. A timeout was performed prior to the initiation of the procedure. The patient was placed prone on the exam table. The left flank was prepped and draped in the standard sterile fashion. Ultrasound was used to evaluate the left kidney, which demonstrated hydronephrosis with echogenic urine concerning for pyonephrosis. Skin entry site was marked, and local analgesia was obtained with 1% lidocaine. Using ultrasound guidance, an appropriate lower pole posterior calyx was punctured using a 21-gauge Chiba needle. Entry into the collecting system was confirmed with return of purulent urine. An 018 wire was advanced through the needle into the renal pelvis and proximal ureter, followed by placement of a transition dilator. An antegrade nephrostogram was then performed, which demonstrated hydronephrosis with heterogeneous filling of the renal pelvis. Over an 035 Amplatz wire, the percutaneous tract was serially dilated, and a 10.2 French nephrostomy tube was advanced with the loop formed in the renal pelvis. Appropriate positioning of the nephrostomy tube was confirmed with injection of contrast material. The nephrostomy tube was secured to skin using silk suture and a dressing. It was attached to bag drainage. The patient was placed prone on the exam table. The right flank was prepped and draped in the standard sterile fashion.  Ultrasound was used to evaluate the right kidney, which demonstrated hydronephrosis. Skin entry site was marked, and local analgesia was obtained with 1% lidocaine. Using ultrasound guidance, an appropriate lower pole posterior calyx was punctured using a 21-gauge Chiba needle. Entry into the collecting system was confirmed with return of urine. An 018 wire was advanced through the needle into the renal pelvis and proximal ureter, followed by placement of a transition dilator. An antegrade nephrostogram was then performed, which demonstrated hydronephrosis. Over an 035 Amplatz wire, the percutaneous tract was serially dilated, and a 10.2 French nephrostomy tube was advanced with the loop formed in the renal pelvis. Appropriate positioning of the nephrostomy tube was confirmed with injection of contrast material. The nephrostomy tube was secured to skin using silk suture and a dressing. It was attached to bag drainage. The patient tolerated the procedure well without immediate complication. IMPRESSION: 1. Successful placement of 10.2 French left percutaneous nephrostomy tube using ultrasound and fluoroscopic guidance. A sample of grossly purulent urine was sent to the lab for microbiology analysis. 2. Successful placement of a 10.2 French right percutaneous nephrostomy tube using ultrasound and fluoroscopic guidance. 3. Nephrostomy tubes placed to bag drainage. 4. Patient to return to Interventional Radiology in 6 weeks for routine drain care. Further long-term management per Urology. Electronically Signed   By: Albin Felling M.D.   On: 12/17/2021 15:41        Scheduled Meds:  Chlorhexidine Gluconate Cloth  6 each Topical Q0600   feeding supplement  237 mL Oral TID BM   multivitamin with minerals  1 tablet Oral Daily   pantoprazole  40 mg Oral BID   Continuous Infusions:  cefTRIAXone (ROCEPHIN)  IV Stopped (12/18/21 0263)   chlorproMAZINE (THORAZINE) IV Stopped (12/17/21 2332)     LOS: 3 days     Time spent: 25 mins     Wyvonnia Dusky, MD [facility] Pager [phone]  If 7PM-7AM, please contact  night-coverage 12/19/2021, 7:27 AM

## 2021-12-20 DIAGNOSIS — N179 Acute kidney failure, unspecified: Secondary | ICD-10-CM | POA: Diagnosis not present

## 2021-12-20 DIAGNOSIS — R627 Adult failure to thrive: Secondary | ICD-10-CM | POA: Diagnosis not present

## 2021-12-20 DIAGNOSIS — R7881 Bacteremia: Secondary | ICD-10-CM | POA: Diagnosis not present

## 2021-12-20 DIAGNOSIS — N1831 Chronic kidney disease, stage 3a: Secondary | ICD-10-CM | POA: Diagnosis not present

## 2021-12-20 LAB — URINE CULTURE: Culture: >=100000 — AB

## 2021-12-20 LAB — GLUCOSE, CAPILLARY: Glucose-Capillary: 107 mg/dL — ABNORMAL HIGH (ref 70–99)

## 2021-12-20 MED ORDER — FAMOTIDINE 20 MG PO TABS
40.0000 mg | ORAL_TABLET | Freq: Every day | ORAL | Status: DC | PRN
  Administered 2021-12-20 – 2021-12-21 (×2): 40 mg via ORAL
  Filled 2021-12-20 (×2): qty 2

## 2021-12-20 MED ORDER — CALCIUM CARBONATE ANTACID 500 MG PO CHEW: 400.0000 mg | CHEWABLE_TABLET | Freq: Three times a day (TID) | ORAL | Status: DC | PRN

## 2021-12-20 NOTE — TOC Progression Note (Signed)
Transition of Care [facility]) - Progression Note    Patient Details  Name: Anita Sullivan MRN: [id number] Date of Birth: 1966-01-10  Transition of Care [facility]) CM/SW Contact  [address] [place], [place] Phone Number: 12/20/2021, 10:19 AM  Clinical Narrative:     This Education officer, museum was contacted by [facility]. There is no bed available today but will continue to follow. Patient's  family considering that patient be transferred to [facility] in [place]. This agency was contacted [phone], spoke to [facility] who requested that patient's demographic sheet, progress notes and medication list be faxed to [phone]. They do not provide transportation. [facility] contacted, they are not able to provide transport due to distance. This Education officer, museum will follow up with the family.  [address] LCSW Transition of Care [phone]        Expected Discharge Plan and Services                                                 Social Determinants of Health (SDOH) Interventions    Readmission Risk Interventions     View : No data to display.

## 2021-12-20 NOTE — Progress Notes (Signed)
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 Continue w/ comfort care. Pt's daughter is still requesting inpatient hospice   Obstructive uropathy: w/ b/l hydronephrosis. S/p b/l ureteral stents on 08/15/20 but CT scan shows likely nonfunctional stents. S/p placement of b/l percutaneous nephrostomy tubes 12/17/21 as per IR.   AKI on CKDIIIb: comfort care only   Sepsis: met criteria w/ leukocytosis, tachycardia, tachypnea & likely UTI & bacteremia.  Urine cx growing e. coli  Bacteremia: blood cxs growing e. coli, sens pending. Likely secondary to UTI. D/c IV rocephin today as per pt's request   Metabolic acidosis: comfort care only   Leukocytosis: secondary to above infection. Trending down.   Hyperkalemia: resolved  Hyponatremia: resolved   Colorectal cancer w/ mets: stage IV as per pt's daughter. S/p chemo, radiation. Declined to have any further treatment. 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 Has been pooping blood out as per pt's daughter. Declined blood transfusions. Continue w/ comfort care only   Hyperbilirubinemia: etiology unclear. Continue w/ comfort care only   Severe malnutrition: likely secondary to colorectal cancer. Continue w/ comfort care only   DVT prophylaxis: comfort care  Code Status: DNR Family Communication:  discussed pt's care w/ pt's daughter, [name], and answered her questions  Disposition Plan: comfort care only, family requesting inpatient hospice   Level of care: Med-Surg  Status is: Inpatient Remains inpatient appropriate because: pt's family is requesting inpatient hospice for the pt     Consultants:  Urology IR  Nephro   Procedures:   Antimicrobials: rocephin    Subjective: Pt c/o intermittent pain    Objective: Vitals:   12/18/21 1949 12/19/21 0743 12/19/21 1129 12/19/21 2027  BP: 104/67 104/64 97/61 107/68  Pulse: 86 80 89 (!) 105  Resp: 16 15 17 17   Temp: 98.4 F (36.9 C) 98.2 F (36.8 C) 98.2 F (36.8 C) 97.7 F (36.5 C)  TempSrc:   Oral   SpO2: 100% 100% 100% 97%  Weight:      Height:        Intake/Output Summary (Last 24 hours) at 12/20/2021 0723 Last data filed at 12/20/2021 0656 Gross per 24 hour  Intake 750 ml  Output 2680 ml  Net -1930 ml   Filed Weights   12/16/21 1905  Weight: 55.4 kg    Examination:  General exam: Appears intermittently confused Respiratory system: diminished breath sounds  Cardiovascular system: S1/S2+. No rubs or clicks  Gastrointestinal system: Abd is soft, NT, ND & hypoactive bowel sounds  Central nervous system: Moves all extremities  Psychiatry: judgement and insight appears not at baseline. Flat mood and affect     Data Reviewed: I have personally reviewed following labs and imaging studies  CBC: Recent Labs  Lab 12/16/21 1320 12/17/21 0425 12/17/21 0909 12/17/21 1839 12/18/21 0759 12/19/21 0522  WBC 43.9* 38.0*  --   --  22.2* 15.1*  NEUTROABS 40.7*  --   --   --   --   --   HGB 11.6* 8.7* 8.3* 6.9* 7.7* 7.5*  HCT 38.1 27.4*  --  21.2* 23.7* 23.5*  MCV 97.9 93.8  --   --  91.5 91.8  PLT 215 166  --   --  79* 59*   Basic Metabolic Panel: Recent Labs  Lab 12/16/21 1402 12/16/21 2121 12/17/21 0425 12/18/21 0640 12/19/21 0522  NA 129* 133* 133* 136 133*  K 5.2* 4.5 4.7 4.0 3.3*  CL 102  109 109 107 104  CO2 10* 10* 11* 15* 17*  GLUCOSE 96 92 77 88 113*  BUN QUANTITY NOT SUFFICIENT, UNABLE TO PERFORM TEST 109* 111* 95* 96*  CREATININE 7.48* 6.75* 7.12* 5.29* 4.40*  CALCIUM 7.5* 6.9* 7.1* 7.2* 7.3*  MG  --   --  2.7*  --   --    GFR: Estimated Creatinine Clearance: 12.2 mL/min (A) (by C-G formula based on SCr of 4.4 mg/dL (H)). Liver Function Tests: Recent Labs  Lab 12/16/21 1402 12/18/21 0640 12/19/21 0522  AST 15 18 22   ALT 8 8 11   ALKPHOS 164* 133* 167*  BILITOT 1.6* 1.6* 1.2  PROT 7.4 5.3* 5.4*  ALBUMIN 2.1* <1.5* <1.5*   No results for input(s): LIPASE, AMYLASE in the last 168 hours. No results for input(s): AMMONIA in the last 168 hours. Coagulation Profile: Recent Labs  Lab 12/16/21 1357  INR 1.7*   Cardiac Enzymes: No results for input(s): CKTOTAL, CKMB, CKMBINDEX, TROPONINI in the last 168 hours. BNP (last 3 results) No results for input(s): PROBNP in the last 8760 hours. HbA1C: No results for input(s): HGBA1C in the last 72 hours. CBG: Recent Labs  Lab 12/17/21 1559 12/17/21 1954 12/18/21 0751 12/19/21 0740 12/19/21 1131  GLUCAP 100* 84 95 114* 138*   Lipid Profile: No results for input(s): CHOL, HDL, LDLCALC, TRIG, CHOLHDL, LDLDIRECT in the last 72 hours. Thyroid Function Tests: No results for input(s): TSH, T4TOTAL, FREET4, T3FREE, THYROIDAB in the last 72 hours.  Anemia Panel: No results for input(s): VITAMINB12, FOLATE, FERRITIN, TIBC, IRON, RETICCTPCT in the last 72 hours. Sepsis Labs: Recent Labs  Lab 12/16/21 1357 12/16/21 2121  PROCALCITON  --  3.90  LATICACIDVEN 1.7 0.9    Recent Results (from the past 240 hour(s))  Culture, blood (Routine x 2)     Status: Abnormal   Collection Time: 12/16/21  1:57 PM   Specimen: BLOOD  Result Value Ref Range Status   Specimen Description   Final    BLOOD LEFT [facility] Performed at [facility], [address], [place], [place] [address]    Special Requests   Final     BOTTLES DRAWN AEROBIC AND ANAEROBIC Blood Culture adequate volume Performed at [facility], [address] [place], [place] [address]    Culture  Setup Time   Final    GRAM NEGATIVE RODS IN BOTH AEROBIC AND ANAEROBIC BOTTLES CRITICAL VALUE NOTED.  VALUE IS CONSISTENT WITH PREVIOUSLY REPORTED AND CALLED VALUE. GRAM STAIN REVIEWED-AGREE WITH RESULT    Culture (A)  Final    ESCHERICHIA COLI SUSCEPTIBILITIES PERFORMED ON PREVIOUS CULTURE WITHIN THE LAST 5 DAYS. Performed at [facility], [place] [address] [place], [place] [address]    Report Status 12/19/2021 FINAL  Final  Culture, blood (Routine x 2)     Status: Abnormal   Collection Time: 12/16/21  2:02 PM   Specimen: BLOOD  Result Value Ref Range Status   Specimen Description   Final    BLOOD RIGHT ANTECUBITAL Performed at [facility], [address] [place], [place] [address]    Special Requests   Final    BOTTLES DRAWN AEROBIC AND ANAEROBIC Blood Culture results may not be optimal due to an inadequate volume of blood received in culture bottles Performed at [facility], [address] [place], [place] [address]    Culture  Setup Time   Final    Organism ID to follow IN BOTH AEROBIC AND ANAEROBIC BOTTLES GRAM NEGATIVE RODS CRITICAL RESULT CALLED TO, READ BACK BY AND VERIFIED WITH: NATHAN BELUTH @ 8921 ON 10/17/2021.Marland KitchenMarland KitchenTKR GRAM STAIN REVIEWED-AGREE WITH RESULT Performed at [facility], [place] [address], [place],  [address]    Culture ESCHERICHIA COLI (A)  Final   Report Status 12/19/2021 FINAL  Final   Organism ID, Bacteria ESCHERICHIA COLI  Final      Susceptibility   Escherichia coli - MIC*    AMPICILLIN 8 SENSITIVE Sensitive     CEFAZOLIN <=4 SENSITIVE Sensitive     CEFEPIME <=0.12 SENSITIVE Sensitive     CEFTAZIDIME <=1 SENSITIVE Sensitive     CEFTRIAXONE <=0.25 SENSITIVE Sensitive     CIPROFLOXACIN <=0.25 SENSITIVE Sensitive     GENTAMICIN <=1 SENSITIVE Sensitive      IMIPENEM <=0.25 SENSITIVE Sensitive     TRIMETH/SULFA <=20 SENSITIVE Sensitive     AMPICILLIN/SULBACTAM <=2 SENSITIVE Sensitive     PIP/TAZO <=4 SENSITIVE Sensitive     * ESCHERICHIA COLI  Blood Culture ID Panel (Reflexed)     Status: Abnormal   Collection Time: 12/16/21  2:02 PM  Result Value Ref Range Status   Enterococcus faecalis NOT DETECTED NOT DETECTED Final   Enterococcus Faecium NOT DETECTED NOT DETECTED Final   Listeria monocytogenes NOT DETECTED NOT DETECTED Final   Staphylococcus species NOT DETECTED NOT DETECTED Final   Staphylococcus aureus (BCID) NOT DETECTED NOT DETECTED Final   Staphylococcus epidermidis NOT DETECTED NOT DETECTED Final   Staphylococcus lugdunensis NOT DETECTED NOT DETECTED Final   Streptococcus species NOT DETECTED NOT DETECTED Final   Streptococcus agalactiae NOT DETECTED NOT DETECTED Final   Streptococcus pneumoniae NOT DETECTED NOT DETECTED Final   Streptococcus pyogenes NOT DETECTED NOT DETECTED Final   A.calcoaceticus-baumannii NOT DETECTED NOT DETECTED Final   Bacteroides fragilis NOT DETECTED NOT DETECTED Final   Enterobacterales DETECTED (A) NOT DETECTED Final    Comment: Enterobacterales represent a large order of gram negative bacteria, not a single organism. CRITICAL RESULT CALLED TO, READ BACK BY AND VERIFIED WITH: NATHAN BELUTH @ 4081 ON 10/17/2021.Marland KitchenMarland KitchenTKR    Enterobacter cloacae complex NOT DETECTED NOT DETECTED Final   Escherichia coli DETECTED (A) NOT DETECTED Final    Comment: CRITICAL RESULT CALLED TO, READ BACK BY AND VERIFIED WITH: NATHAN BELUTH @ 4481 ON 10/17/2021.Marland KitchenMarland KitchenTKR    Klebsiella aerogenes NOT DETECTED NOT DETECTED Final   Klebsiella oxytoca NOT DETECTED NOT DETECTED Final   Klebsiella pneumoniae NOT DETECTED NOT DETECTED Final   Proteus species NOT DETECTED NOT DETECTED Final   Salmonella species NOT DETECTED NOT DETECTED Final   Serratia marcescens NOT DETECTED NOT DETECTED Final   Haemophilus influenzae NOT DETECTED NOT  DETECTED Final   Neisseria meningitidis NOT DETECTED NOT DETECTED Final   Pseudomonas aeruginosa NOT DETECTED NOT DETECTED Final   Stenotrophomonas maltophilia NOT DETECTED NOT DETECTED Final   Candida albicans NOT DETECTED NOT DETECTED Final   Candida auris NOT DETECTED  NOT DETECTED Final   Candida glabrata NOT DETECTED NOT DETECTED Final   Candida krusei NOT DETECTED NOT DETECTED Final   Candida parapsilosis NOT DETECTED NOT DETECTED Final   Candida tropicalis NOT DETECTED NOT DETECTED Final   Cryptococcus neoformans/gattii NOT DETECTED NOT DETECTED Final   CTX-M ESBL NOT DETECTED NOT DETECTED Final   Carbapenem resistance IMP NOT DETECTED NOT DETECTED Final   Carbapenem resistance KPC NOT DETECTED NOT DETECTED Final   Carbapenem resistance NDM NOT DETECTED NOT DETECTED Final   Carbapenem resist OXA 48 LIKE NOT DETECTED NOT DETECTED Final   Carbapenem resistance VIM NOT DETECTED NOT DETECTED Final    Comment: Performed at [facility], [address] [place], [place] [address]  MRSA Next Gen by PCR, Nasal     Status: None   Collection Time: 12/16/21  7:09 PM   Specimen: Nasal Mucosa; Nasal Swab  Result Value Ref Range Status   MRSA by PCR Next Gen NOT DETECTED NOT DETECTED Final    Comment: (NOTE) The GeneXpert MRSA Assay (FDA approved for NASAL specimens only), is one component of a comprehensive MRSA colonization surveillance program. It is not intended to diagnose MRSA infection nor to guide or monitor treatment for MRSA infections. Test performance is not FDA approved in patients less than 64 years old. Performed at [facility], [address] [place], [place] [address]   Urine Culture     Status: Abnormal   Collection Time: 12/16/21  9:21 PM   Specimen: Urine, Random  Result Value Ref Range Status   Specimen Description   Final    URINE, RANDOM Performed at [facility], [address] [place], [place] [address]    Special Requests    Final    NONE Performed at [facility], [address] [place],  [address]    Culture >=100,000 COLONIES/mL ESCHERICHIA COLI (A)  Final   Report Status 12/19/2021 FINAL  Final   Organism ID, Bacteria ESCHERICHIA COLI (A)  Final      Susceptibility   Escherichia coli - MIC*    AMPICILLIN <=2 SENSITIVE Sensitive     CEFAZOLIN <=4 SENSITIVE Sensitive     CEFEPIME <=0.12 SENSITIVE Sensitive     CEFTRIAXONE <=0.25 SENSITIVE Sensitive     CIPROFLOXACIN <=0.25 SENSITIVE Sensitive     GENTAMICIN <=1 SENSITIVE Sensitive     IMIPENEM <=0.25 SENSITIVE Sensitive     NITROFURANTOIN <=16 SENSITIVE Sensitive     TRIMETH/SULFA <=20 SENSITIVE Sensitive     AMPICILLIN/SULBACTAM <=2 SENSITIVE Sensitive     PIP/TAZO <=4 SENSITIVE Sensitive     * >=100,000 COLONIES/mL ESCHERICHIA COLI  Urine Culture     Status: Abnormal (Preliminary result)   Collection Time: 12/17/21 12:04 PM   Specimen: Urine, Catheterized  Result Value Ref Range Status   Specimen Description URINE, CATHETERIZED  Final   Special Requests LEFT KIDNEY PCN DRAIN  Final   Culture (A)  Final    >=100,000 COLONIES/mL ESCHERICHIA COLI CULTURE REINCUBATED FOR BETTER GROWTH SUSCEPTIBILITIES TO FOLLOW Performed at [facility], [address] [place],  [address]    Report Status PENDING  Incomplete         Radiology Studies: No results found.      Scheduled Meds:  Chlorhexidine Gluconate Cloth  6 each Topical Q0600   feeding supplement  237 mL Oral TID BM   multivitamin with minerals  1 tablet Oral Daily   pantoprazole  40 mg Oral BID  Continuous Infusions:  cefTRIAXone (ROCEPHIN)  IV 2 g (12/19/21 0756)   chlorproMAZINE (THORAZINE) IV Stopped (12/17/21 2332)     LOS: 4 days    Time spent: 30 mins     Wyvonnia Dusky, MD [facility] Pager [phone]  If 7PM-7AM, please contact night-coverage 12/20/2021, 7:23 AM

## 2021-12-20 NOTE — TOC Progression Note (Signed)
Transition of Care [facility]) - Progression Note    Patient Details  Name: Anita Sullivan MRN: [id number] Date of Birth: 02-16-1966  Transition of Care [facility]) CM/SW Contact  Jacqueline Spofford, [place], [place] Phone Number: 12/20/2021, 1:58 PM  Clinical Narrative:     Met with patient and daughter at bedside. Patient does not want residential  hospice. Plan is to discharge to ex-husbands home in [place]. She is agreeable to in home hospice through [facility] in [place], [place]. This Education officer, museum met with patient's ex-husband and spouse and they are agreeable to bringing patient to their home with in home hospice services in place. Quillian Quince and Baldomero Lamy [phone] or [phone]. Referral placed with [facility]. [phone] Fax [phone]. Hospital bed to be delivered tomorrow-Heidi Clotilde Dieter [phone] will meet the delivery driver at the home for drop off. Zoei Amison will arrive tomorrow at approximately 11am to pick up patient for discharge. MD made aware.  Transitions of Care to continue to follow   [facility], LCSW Transition of Care [phone]        Expected Discharge Plan and Services                                                 Social Determinants of Health (SDOH) Interventions    Readmission Risk Interventions     View : No data to display.

## 2021-12-20 NOTE — Progress Notes (Signed)
Spoke with Butch Penny, RN, at [facility] in [place].

## 2021-12-20 NOTE — Progress Notes (Addendum)
Manufacturing engineer [facility]) Hospital Liaison Note  Approval for Hospice Home is determined by [facility] MD. Once Edward [place]field MD has determined Hospice Home eligibility, [facility] will update hospital staff and family. Patient has been approved for the Hospice Home.   Contacted daughter/[phone] to notify her of above updates and   Unfortunately, Hospice Home is not able to offer a room today. Family and Chrystal/TOC Manager aware hospital liaison will follow up tomorrow or sooner if a room becomes available.   AddendumSeward Carol contacted MSW and reported that patient/family would like to find and agency closer to [place], [place]. Unfortunately, [facility] does not cover this region. TOC notified.   [facility] will continue to follow through dispo in the event patient/family would like to pursue the Hospice Home.   Please do not hesitate to call with any hospice related questions.    Thank you for the opportunity to participate in this patient's care.   Daphene Calamity, MSW [facility] Liaison [phone]

## 2021-12-20 NOTE — TOC Progression Note (Signed)
Transition of Care [facility]) - Progression Note    Patient Details  Name: Anita Sullivan MRN: [id number] Date of Birth: 20-Feb-1966  Transition of Care [facility]) CM/SW Contact  [address] [place], [place] Phone Number: 12/20/2021, 3:34 PM  Clinical Narrative:    Phone call from [facility] in [place].  Patient has been approved for in home hospice care. Hospital bed ordered and will arrive tomorrow. Plan is for patient to complete admission paperwork tomorrow at [place] has updated patient and will call caregivers-Daniel and Baldomero Lamy to provide update regarding plan as well.  Transition of Care team to continue to follow  [facility], LCSW Transition of Care [phone]         Expected Discharge Plan and Services                                                 Social Determinants of Health (SDOH) Interventions    Readmission Risk Interventions     View : No data to display.

## 2021-12-21 DIAGNOSIS — R634 Abnormal weight loss: Secondary | ICD-10-CM | POA: Diagnosis not present

## 2021-12-21 DIAGNOSIS — N179 Acute kidney failure, unspecified: Secondary | ICD-10-CM | POA: Diagnosis not present

## 2021-12-21 DIAGNOSIS — N1831 Chronic kidney disease, stage 3a: Secondary | ICD-10-CM | POA: Diagnosis not present

## 2021-12-21 DIAGNOSIS — N139 Obstructive and reflux uropathy, unspecified: Secondary | ICD-10-CM | POA: Diagnosis not present

## 2021-12-21 DIAGNOSIS — R112 Nausea with vomiting, unspecified: Secondary | ICD-10-CM | POA: Diagnosis not present

## 2021-12-21 DIAGNOSIS — C7800 Secondary malignant neoplasm of unspecified lung: Secondary | ICD-10-CM | POA: Diagnosis not present

## 2021-12-21 DIAGNOSIS — C189 Malignant neoplasm of colon, unspecified: Secondary | ICD-10-CM | POA: Diagnosis not present

## 2021-12-21 DIAGNOSIS — R5381 Other malaise: Secondary | ICD-10-CM | POA: Diagnosis not present

## 2021-12-21 DIAGNOSIS — I959 Hypotension, unspecified: Secondary | ICD-10-CM | POA: Diagnosis not present

## 2021-12-21 DIAGNOSIS — C787 Secondary malignant neoplasm of liver and intrahepatic bile duct: Secondary | ICD-10-CM | POA: Diagnosis not present

## 2021-12-21 DIAGNOSIS — E46 Unspecified protein-calorie malnutrition: Secondary | ICD-10-CM | POA: Diagnosis not present

## 2021-12-21 DIAGNOSIS — N823 Fistula of vagina to large intestine: Secondary | ICD-10-CM | POA: Diagnosis not present

## 2021-12-21 DIAGNOSIS — B9620 Unspecified Escherichia coli [E. coli] as the cause of diseases classified elsewhere: Secondary | ICD-10-CM | POA: Diagnosis not present

## 2021-12-21 DIAGNOSIS — A419 Sepsis, unspecified organism: Secondary | ICD-10-CM | POA: Diagnosis not present

## 2021-12-21 DIAGNOSIS — N1330 Unspecified hydronephrosis: Secondary | ICD-10-CM | POA: Diagnosis not present

## 2021-12-21 DIAGNOSIS — R627 Adult failure to thrive: Secondary | ICD-10-CM | POA: Diagnosis not present

## 2021-12-21 DIAGNOSIS — D630 Anemia in neoplastic disease: Secondary | ICD-10-CM | POA: Diagnosis not present

## 2021-12-21 DIAGNOSIS — Z6822 Body mass index (BMI) 22.0-22.9, adult: Secondary | ICD-10-CM | POA: Diagnosis not present

## 2021-12-21 DIAGNOSIS — F419 Anxiety disorder, unspecified: Secondary | ICD-10-CM | POA: Diagnosis not present

## 2021-12-21 DIAGNOSIS — N1832 Chronic kidney disease, stage 3b: Secondary | ICD-10-CM | POA: Diagnosis not present

## 2021-12-21 DIAGNOSIS — N390 Urinary tract infection, site not specified: Secondary | ICD-10-CM | POA: Diagnosis not present

## 2021-12-21 DIAGNOSIS — G893 Neoplasm related pain (acute) (chronic): Secondary | ICD-10-CM | POA: Diagnosis not present

## 2021-12-21 MED ORDER — MORPHINE SULFATE 15 MG PO TABS: 15.0000 mg | ORAL_TABLET | ORAL | 0 refills | Status: AC | PRN

## 2021-12-21 MED ORDER — HEPARIN SODIUM (PORCINE) 1000 UNIT/ML IJ SOLN
1000.0000 [IU] | Freq: Once | INTRAMUSCULAR | Status: AC
  Administered 2021-12-21: 1000 [IU] via INTRAVENOUS
  Filled 2021-12-21: qty 1

## 2021-12-21 MED ORDER — LORAZEPAM 1 MG PO TABS: 1.0000 mg | ORAL_TABLET | Freq: Three times a day (TID) | ORAL | 0 refills | Status: AC | PRN

## 2021-12-21 NOTE — Plan of Care (Signed)
  Problem: Nutrition: Goal: Adequate nutrition will be maintained Outcome: Not Progressing   Problem: Activity: Goal: Risk for activity intolerance will decrease Outcome: Not Progressing

## 2021-12-21 NOTE — Discharge Summary (Signed)
Physician Discharge Summary  Anita Sullivan HWE:[id number] DOB: [birth year] DOA: 12/16/2021  PCP: [name], NP  Admit date: 12/16/2021 Discharge date: 12/21/2021  Admitted From: home  Disposition:  home w/ hospice   Recommendations for Outpatient Follow-up:  Follow up with hospice provider ASAP  Home Health: Equipment/Devices: 2L [place]  Discharge Condition: hospice  CODE STATUS: DNR/DNI. On comfort care  Diet recommendation: regular as tolerated   Brief/Interim Summary: HPI was taken from Dr. Blaine Hamper: Anita Sullivan is a 56 y.o. female with medical history significant of obstructive uropathy and hydronephrosis (s/p of bilateral ureteral stent placement), metastasized colon cancer, CKD-3A, GERD, GI bleeding, who presents with abdominal pain, weakness.   Patient has generalized weakness recently.  She has very poor appetite, did not eat much in the past 5 days.  She has nausea and dry heaves.  Patient has diffuse abdominal pain, right side is worse than the left.  The abdominal pain is constant, sharp, 10 out of 10 in severity, nonradiating.  No fever or chills.  Patient has cough with clear mucus production, and mild shortness of breath, no chest pain.  Denies symptoms of UTI.  Patient was seen in Dr. Gary Fleet office today in cancer center and was sent to ED for further evaluation and treatment.   Data Reviewed and ED Course: pt was found to have significantly worsening renal function with creatinine up from 1.43 to 7.49, WBC 43.9, lactic acid 1.7, INR 1.7, potassium 5.2, sodium 129, temperature 97.5, soft blood pressure with SBP 80s-90s, heart rate 105, RR 23, oxygen saturation 99% on room air.  CT scan of abdomen/pelvis showed progressive colon cancer and new moderate bilateral hydronephrosis attributed to prominent ureteral stent encrustation in the bladder. The stents are likely nonfunctional, and may need replacement. Pt is admitted to [facility] as inpt. Dr. Erlene Quan of urology and Dr.  Holley Raring of renal and Dr. Mortimer Fries and Stark Klein of ICU are consulted.   First CXR is negative for infiltration, but showed that left IJ Port-A-Cath tip may be directed posteriorly within the azygous vein.  The repeated CXR-lateral view showed that left IJ Port-A-Cath in stable position. Its tip is in the SVC based on the prior chest CT.   CT-abd/pelvis: 1. Progressive pelvic soft tissue nodularity and nodular mural thickening of the rectosigmoid colon consistent with progressive local malignancy (colon cancer). 2. 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 3. New moderate bilateral hydronephrosis attributed to prominent ureteral stent encrustation in the bladder. The stents are likely nonfunctional, and replacement should be considered. 4. Known hepatic metastatic disease is not optimally visualized, but does not appear significantly progressive. 5. New nodularity at the left lung base, suspicious for progressive metastatic disease.    As per Dr. Jimmye Norman 5/18-5/22/23: Pt presented obstructive uropathy w/ b/l hydronephrosis. Pt had b/l ureteral stents placed on 08/15/20 but CT scan showed likely nonfunctional stents. Pt is s/p placement of b/l percutaneous nephrostomy tubes 12/17/21 as per IR. Cr started to trend down. Of note, pt was found to have bacteremia secondary to e. coli which was likely secondary to UTI. Pt was initially placed on IV rocephin. Furthermore, pt had been previously pooping out blood at home as per pt's daughter. Pt's H&H continued to drop below 7 but pt refused any more blood transfusions. A discussion was had w/ ICU NP, pt and pt's daughter overnight and pt was made comfort care. Pt was transported via private vehicle and hospice will be delivering  a hospital bed. [facility] in [place], [place] will be providing home hospice.   Discharge Diagnoses:  Principal Problem:   Acute renal failure superimposed on stage 3a chronic  kidney disease (HCC) Active Problems:   Hydronephrosis   Primary malignant neoplasm of colorectal area with metastasis (HCC)   Abdominal pain   Hyponatremia   Hyperkalemia   SIRS (systemic inflammatory response syndrome) (HCC)   Protein-calorie malnutrition, severe  Failure to thrive/declining performance status: pt was made comfort care last night after a discussion w/ ICU NP, pt & pt's daughter overnight. Continue w/ comfort care   Obstructive uropathy: w/ b/l hydronephrosis. S/p b/l ureteral stents on 08/15/20 but CT scan shows likely nonfunctional stents. S/p placement of b/l percutaneous nephrostomy tubes 12/17/21 as per IR.    AKI on CKDIIIb: comfort care only    Sepsis: met criteria w/ leukocytosis, tachycardia, tachypnea & likely UTI & bacteremia.  Urine cx growing e. coli   Bacteremia: blood cxs growing e. coli. Likely secondary to UTI. D/c IV rocephin 11/20/21 as per pt's request    Metabolic acidosis: comfort care only    Leukocytosis: secondary to above infection.   Hyperkalemia: resolved   Hyponatremia: resolved    Colorectal cancer w/ mets: stage IV as per pt's daughter. S/p chemo, radiation. Declined to have any further treatment. 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 Has been pooping blood out as per pt's daughter. Declined blood transfusions. Continue w/ comfort care only    Hyperbilirubinemia: etiology unclear. Continue w/ comfort care only    Severe malnutrition: likely secondary to colorectal cancer. Continue w/ comfort care only   Discharge Instructions  Discharge Instructions     Diet general   Complete by: As directed    Discharge instructions   Complete by: As directed    F/u w/ hospice provider as soon as possible   Increase activity slowly   Complete by: As directed    No wound care   Complete by: As directed       Allergies as of 12/21/2021   No Known Allergies      Medication List      STOP taking these medications    cyclobenzaprine 10 MG tablet Commonly known as: FLEXERIL   HYDROcodone-acetaminophen 5-325 MG tablet Commonly known as: Norco       TAKE these medications    acetaminophen 325 MG tablet Commonly known as: TYLENOL Take 650 mg by mouth every 6 (six) hours as needed.   IMODIUM PO Take by mouth.   lidocaine-prilocaine cream Commonly known as: EMLA Apply 1 application topically as needed. Apply prior to appointment. Cover with plastic wrap.   loratadine 10 MG tablet Commonly known as: CLARITIN Take 10 mg by mouth daily.   LORazepam 1 MG tablet Commonly known as: ATIVAN Take 1 tablet (1 mg total) by mouth every 8 (eight) hours as needed for up to 5 days for anxiety.   morphine 15 MG tablet Commonly known as: MSIR Take 1 tablet (15 mg total) by mouth every 4 (four) hours as needed for up to 5 days for severe pain or moderate pain.   ondansetron 4 MG tablet Commonly known as: ZOFRAN Take 1 tablet (4 mg total) by mouth every 8 (eight) hours as needed for nausea or vomiting.   pantoprazole 40 MG tablet Commonly known as: PROTONIX Take 1 tablet (40 mg total) by mouth 2 (two) times daily.  Durable Medical Equipment  (From admission, onward)           Start     Ordered   12/19/21 0856  For home use only DME Overbed table  Once        12/19/21 0855   12/19/21 0728  For home use only DME oxygen  Once       Question Answer Comment  Length of Need Lifetime   Liters per Minute 2   Frequency Continuous (stationary and portable oxygen unit needed)   Oxygen conserving device Yes   Oxygen delivery system Gas      12/19/21 0727   12/19/21 0727  For home use only DME Hospital bed  Once       Question Answer Comment  Length of Need Lifetime   Patient has (list medical condition): colorectal cancer   Bed type Semi-electric      12/19/21 0727            No Known Allergies  Consultations: ICU IR Hospice   Onco    Procedures/Studies: CT ABDOMEN PELVIS WO CONTRAST  Result Date: 12/16/2021 CLINICAL DATA:  Abdominal pain, acute, nonlocalized. Metastatic colon cancer post Y 90 therapy 2 months ago. Patient reports no oral intake over the last 5 days. * Tracking Code: BO * EXAM: CT ABDOMEN AND PELVIS WITHOUT CONTRAST TECHNIQUE: Multidetector CT imaging of the abdomen and pelvis was performed following the standard protocol without IV contrast. RADIATION DOSE REDUCTION: This exam was performed according to the departmental dose-optimization program which includes automated exposure control, adjustment of the mA and/or kV according to patient size and/or use of iterative reconstruction technique. COMPARISON:  Abdominopelvic CT 07/31/2021. FINDINGS: Lower chest: Stable right lower lobe scarring. There are several new nodular densities at the left lung base, suspicious for metastatic disease. Representative nodules include a 4 mm nodule on image 6/3 and a 4 mm nodule on image 8/3. No significant pleural or pericardial effusion. Hepatobiliary: Suboptimal visualization of the previously demonstrated multifocal hepatic metastases on this noncontrast study. Lesions in the right lobe appears slightly smaller, measuring approximately 2.7 cm at the dome on image 9/2 and 2.2 cm inferiorly on image 29/2. A lesion in the left lobe may be slightly larger, measuring approximately 2.7 cm on image 25/2 (previously 2.1 cm. No evidence of gallstones, gallbladder wall thickening or biliary dilatation. Pancreas: Unremarkable. No pancreatic ductal dilatation or surrounding inflammatory changes. Spleen: Normal in size without focal abnormality. Adrenals/Urinary Tract: Both adrenal glands appear normal. Bilateral double-J ureteral stents remain in place with interval progressive bilateral hydronephrosis and mild perinephric soft tissue stranding. There is prominent encrustation in the urinary bladder, likely causing stent malfunction. No  definite renal or ureteral calculi. Chronic right renal cortical thinning. Mild nonspecific bladder wall thickening. Stomach/Bowel: No enteric contrast was administered. The stomach appears unremarkable for its degree of distention. The small bowel, appendix and proximal colon appear normal. Progressive circumferential wall thickening throughout the rectosigmoid colon with progressive adjacent nodularity, suspicious for tumor recurrence and peritoneal carcinomatosis. There is continuity of gas between the rectal lumen and the vagina, highly suspicious for a rectovaginal fistula. No abnormal gas seen in the bladder. Vascular/Lymphatic: No discretely enlarged abdominopelvic lymph nodes identified. No acute vascular findings on noncontrast imaging. 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 As above, gas within the vagina communicates with the rectum, consistent with an underlying rectovaginal fistula. No adnexal mass. Other: Increased diffuse perirectal soft tissue nodularity suspicious for local recurrence. Small amount of pelvic ascites. No  generalized ascites or peritoneal nodularity identified within the abdomen. Musculoskeletal: No acute or significant osseous findings. IMPRESSION: 1. Progressive pelvic soft tissue nodularity and nodular mural thickening of the rectosigmoid colon consistent with progressive local malignancy (colon cancer). 2. 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 3. New moderate bilateral hydronephrosis attributed to prominent ureteral stent encrustation in the bladder. The stents are likely nonfunctional, and replacement should be considered. 4. Known hepatic metastatic disease is not optimally visualized, but does not appear significantly progressive. 5. New nodularity at the left lung base, suspicious for progressive metastatic disease. Electronically Signed   By: Richardean Sale M.D.   On: 12/16/2021 16:04   DG Chest 1  View  Result Date: 12/16/2021 CLINICAL DATA:  Central line placement EXAM: CHEST  1 VIEW COMPARISON:  12/16/2021 FINDINGS: Single frontal view of the chest demonstrates stable left chest wall port tip within the superior vena cava. Right internal jugular catheter tip overlies superior vena cava. Cardiac silhouette is unremarkable. No airspace disease, effusion, or pneumothorax. No acute bony abnormalities. IMPRESSION: 1. No complication after right internal jugular catheter placement. 2. No acute intrathoracic process. Electronically Signed   By: Randa Ngo M.D.   On: 12/16/2021 21:30   DG Chest 1 View  Result Date: 12/16/2021 CLINICAL DATA:  Evaluate Port-A-Cath position. EXAM: CHEST  1 VIEW COMPARISON:  Chest CT 04/14/2021 FINDINGS: The left IJ Port-A-Cath appears to be in stable position when compared to the prior chest CT. It is directed slightly posteriorly but this is within the SVC on the chest CT and not in the azygous vein. IMPRESSION: Left IJ Port-A-Cath in stable position. Its tip is in the SVC based on the prior chest CT. Electronically Signed   By: Marijo Sanes M.D.   On: 12/16/2021 19:00   DG Chest 1 View  Result Date: 12/16/2021 CLINICAL DATA:  Abdominal pain. EXAM: CHEST  1 VIEW COMPARISON:  05/31/2020 and CT chest 04/14/2021. FINDINGS: Trachea is midline. Heart size normal. Left IJ Port-A-Cath tip may be directed posteriorly within the azygous vein. Lungs are clear. No pleural fluid. IMPRESSION: 1. No acute findings. 2. Left IJ Port-A-Cath tip may be directed posteriorly within the azygous vein. A lateral view would be helpful. Electronically Signed   By: Lorin Picket M.D.   On: 12/16/2021 13:21   IR NEPHROSTOMY PLACEMENT LEFT  Result Date: 12/17/2021 INDICATION: Urosepsis, hydronephrosis with encrusted internal ureteral stents EXAM: 1. Ultrasound-guided puncture of left renal calyx 2. Left antegrade nephrostogram through percutaneous access 3. Placement of a left percutaneous  nephrostomy tube using fluoroscopic guidance 4. Ultrasound-guided puncture of right renal calyx 5. Right antegrade nephrostogram through percutaneous access 6. Placement of right percutaneous nephrostomy tube using fluoroscopic guidance COMPARISON:  None Available. MEDICATIONS: Per EMR ANESTHESIA/SEDATION: Moderate (conscious) sedation was employed during this procedure. A total of Versed 1 mg and Fentanyl 25 mcg was administered intravenously by the radiology nurse. Total intra-service moderate Sedation Time: 28 minutes. The patient's level of consciousness and vital signs were monitored continuously by radiology nursing throughout the procedure under my direct supervision. CONTRAST:  12 mL-administered into the collecting system(s) FLUOROSCOPY TIME:  Fluoroscopy Time: 4.1 minutes (17 mGy) COMPLICATIONS: None immediate. PROCEDURE: Informed written consent was obtained from the patient after a thorough discussion of the procedural risks, benefits and alternatives. All questions were addressed. Maximal Sterile Barrier Technique was utilized including caps, mask, sterile gowns, sterile gloves, sterile drape, hand hygiene and skin antiseptic. A timeout was  performed prior to the initiation of the procedure. The patient was placed prone on the exam table. The left flank was prepped and draped in the standard sterile fashion. Ultrasound was used to evaluate the left kidney, which demonstrated hydronephrosis with echogenic urine concerning for pyonephrosis. Skin entry site was marked, and local analgesia was obtained with 1% lidocaine. Using ultrasound guidance, an appropriate lower pole posterior calyx was punctured using a 21-gauge Chiba needle. Entry into the collecting system was confirmed with return of purulent urine. An 018 wire was advanced through the needle into the renal pelvis and proximal ureter, followed by placement of a transition dilator. An antegrade nephrostogram was then performed, which demonstrated  hydronephrosis with heterogeneous filling of the renal pelvis. Over an 035 Amplatz wire, the percutaneous tract was serially dilated, and a 10.2 French nephrostomy tube was advanced with the loop formed in the renal pelvis. Appropriate positioning of the nephrostomy tube was confirmed with injection of contrast material. The nephrostomy tube was secured to skin using silk suture and a dressing. It was attached to bag drainage. The patient was placed prone on the exam table. The right flank was prepped and draped in the standard sterile fashion. Ultrasound was used to evaluate the right kidney, which demonstrated hydronephrosis. Skin entry site was marked, and local analgesia was obtained with 1% lidocaine. Using ultrasound guidance, an appropriate lower pole posterior calyx was punctured using a 21-gauge Chiba needle. Entry into the collecting system was confirmed with return of urine. An 018 wire was advanced through the needle into the renal pelvis and proximal ureter, followed by placement of a transition dilator. An antegrade nephrostogram was then performed, which demonstrated hydronephrosis. Over an 035 Amplatz wire, the percutaneous tract was serially dilated, and a 10.2 French nephrostomy tube was advanced with the loop formed in the renal pelvis. Appropriate positioning of the nephrostomy tube was confirmed with injection of contrast material. The nephrostomy tube was secured to skin using silk suture and a dressing. It was attached to bag drainage. The patient tolerated the procedure well without immediate complication. IMPRESSION: 1. Successful placement of 10.2 French left percutaneous nephrostomy tube using ultrasound and fluoroscopic guidance. A sample of grossly purulent urine was sent to the lab for microbiology analysis. 2. Successful placement of a 10.2 French right percutaneous nephrostomy tube using ultrasound and fluoroscopic guidance. 3. Nephrostomy tubes placed to bag drainage. 4. Patient to  return to Interventional Radiology in 6 weeks for routine drain care. Further long-term management per Urology. Electronically Signed   By: Albin Felling M.D.   On: 12/17/2021 15:41   IR NEPHROSTOMY PLACEMENT RIGHT  Result Date: 12/17/2021 INDICATION: Urosepsis, hydronephrosis with encrusted internal ureteral stents EXAM: 1. Ultrasound-guided puncture of left renal calyx 2. Left antegrade nephrostogram through percutaneous access 3. Placement of a left percutaneous nephrostomy tube using fluoroscopic guidance 4. Ultrasound-guided puncture of right renal calyx 5. Right antegrade nephrostogram through percutaneous access 6. Placement of right percutaneous nephrostomy tube using fluoroscopic guidance COMPARISON:  None Available. MEDICATIONS: Per EMR ANESTHESIA/SEDATION: Moderate (conscious) sedation was employed during this procedure. A total of Versed 1 mg and Fentanyl 25 mcg was administered intravenously by the radiology nurse. Total intra-service moderate Sedation Time: 28 minutes. The patient's level of consciousness and vital signs were monitored continuously by radiology nursing throughout the procedure under my direct supervision. CONTRAST:  12 mL-administered into the collecting system(s) FLUOROSCOPY TIME:  Fluoroscopy Time: 4.1 minutes (17 mGy) COMPLICATIONS: None immediate. PROCEDURE: Informed written consent was obtained from the  patient after a thorough discussion of the procedural risks, benefits and alternatives. All questions were addressed. Maximal Sterile Barrier Technique was utilized including caps, mask, sterile gowns, sterile gloves, sterile drape, hand hygiene and skin antiseptic. A timeout was performed prior to the initiation of the procedure. The patient was placed prone on the exam table. The left flank was prepped and draped in the standard sterile fashion. Ultrasound was used to evaluate the left kidney, which demonstrated hydronephrosis with echogenic urine concerning for  pyonephrosis. Skin entry site was marked, and local analgesia was obtained with 1% lidocaine. Using ultrasound guidance, an appropriate lower pole posterior calyx was punctured using a 21-gauge Chiba needle. Entry into the collecting system was confirmed with return of purulent urine. An 018 wire was advanced through the needle into the renal pelvis and proximal ureter, followed by placement of a transition dilator. An antegrade nephrostogram was then performed, which demonstrated hydronephrosis with heterogeneous filling of the renal pelvis. Over an 035 Amplatz wire, the percutaneous tract was serially dilated, and a 10.2 French nephrostomy tube was advanced with the loop formed in the renal pelvis. Appropriate positioning of the nephrostomy tube was confirmed with injection of contrast material. The nephrostomy tube was secured to skin using silk suture and a dressing. It was attached to bag drainage. The patient was placed prone on the exam table. The right flank was prepped and draped in the standard sterile fashion. Ultrasound was used to evaluate the right kidney, which demonstrated hydronephrosis. Skin entry site was marked, and local analgesia was obtained with 1% lidocaine. Using ultrasound guidance, an appropriate lower pole posterior calyx was punctured using a 21-gauge Chiba needle. Entry into the collecting system was confirmed with return of urine. An 018 wire was advanced through the needle into the renal pelvis and proximal ureter, followed by placement of a transition dilator. An antegrade nephrostogram was then performed, which demonstrated hydronephrosis. Over an 035 Amplatz wire, the percutaneous tract was serially dilated, and a 10.2 French nephrostomy tube was advanced with the loop formed in the renal pelvis. Appropriate positioning of the nephrostomy tube was confirmed with injection of contrast material. The nephrostomy tube was secured to skin using silk suture and a dressing. It was  attached to bag drainage. The patient tolerated the procedure well without immediate complication. IMPRESSION: 1. Successful placement of 10.2 French left percutaneous nephrostomy tube using ultrasound and fluoroscopic guidance. A sample of grossly purulent urine was sent to the lab for microbiology analysis. 2. Successful placement of a 10.2 French right percutaneous nephrostomy tube using ultrasound and fluoroscopic guidance. 3. Nephrostomy tubes placed to bag drainage. 4. Patient to return to Interventional Radiology in 6 weeks for routine drain care. Further long-term management per Urology. Electronically Signed   By: Albin Felling M.D.   On: 12/17/2021 15:41   (Echo, Carotid, EGD, Colonoscopy, ERCP)    Subjective: Pt c/o fatigue    Discharge Exam: Vitals:   12/20/21 1700 12/20/21 2001  BP: (!) 98/49 91/66  Pulse: (!) 125 (!) 129  Resp: 16 16  Temp: 98.3 F (36.8 C) 98 F (36.7 C)  SpO2: 98% 97%   Vitals:   12/19/21 2027 12/20/21 0841 12/20/21 1700 12/20/21 2001  BP: 107/68 93/70 (!) 98/49 91/66  Pulse: (!) 105 (!) 129 (!) 125 (!) 129  Resp: _0 Temp: 97.7 F (36.5 C) (!) 97.3 F (36.3 C) 98.3 F (36.8 C) 98 F (36.7 C)  TempSrc:  Oral Oral   SpO2:  97% 97% 98% 97%  Weight:      Height:        General: Pt is alert, awake, not in acute distress Cardiovascular: S1/S2 +, no rubs, no gallops Respiratory: CTA bilaterally, no wheezing, no rhonchi Abdominal: Soft, NT, ND, bowel sounds + Extremities: no cyanosis    The results of significant diagnostics from this hospitalization (including imaging, microbiology, ancillary and laboratory) are listed below for reference.     Microbiology: Recent Results (from the past 240 hour(s))  Culture, blood (Routine x 2)     Status: Abnormal   Collection Time: 12/16/21  1:57 PM   Specimen: BLOOD  Result Value Ref Range Status   Specimen Description   Final    BLOOD LEFT [facility] Performed at [facility], [address], [place], [place] [address]    Special Requests   Final    BOTTLES DRAWN AEROBIC AND ANAEROBIC Blood Culture adequate volume Performed at [facility], [address], [place], [place] [address]    Culture  Setup Time   Final    GRAM NEGATIVE RODS IN BOTH AEROBIC AND ANAEROBIC BOTTLES CRITICAL VALUE NOTED.  VALUE IS CONSISTENT WITH PREVIOUSLY REPORTED AND CALLED VALUE. GRAM STAIN REVIEWED-AGREE WITH RESULT    Culture (A)  Final    ESCHERICHIA COLI SUSCEPTIBILITIES PERFORMED ON PREVIOUS CULTURE WITHIN THE LAST 5 DAYS. Performed at [facility], [place] [address], [place], [place] [address]    Report Status 12/19/2021 FINAL  Final  Culture, blood (Routine x 2)     Status: Abnormal   Collection Time: 12/16/21  2:02 PM   Specimen: BLOOD  Result Value Ref Range Status   Specimen Description   Final    BLOOD RIGHT ANTECUBITAL Performed at [facility], [address] [place], [place] [address]    Special Requests   Final    BOTTLES DRAWN AEROBIC AND ANAEROBIC Blood Culture results may not be optimal due to an inadequate volume of blood received in culture bottles Performed at [facility], [address] [place], [place] [address]    Culture  Setup Time   Final    Organism ID to follow IN BOTH AEROBIC AND ANAEROBIC BOTTLES GRAM NEGATIVE RODS CRITICAL RESULT CALLED TO, READ BACK BY AND VERIFIED WITH: NATHAN BELUTH @ 0076 ON 10/17/2021.Marland KitchenMarland KitchenTKR GRAM STAIN REVIEWED-AGREE WITH RESULT Performed at [facility], [place] [address] [place], [place] [address]    Culture ESCHERICHIA COLI (A)  Final   Report Status 12/19/2021 FINAL  Final   Organism ID, Bacteria ESCHERICHIA COLI  Final      Susceptibility   Escherichia coli - MIC*    AMPICILLIN 8 SENSITIVE Sensitive     CEFAZOLIN <=4 SENSITIVE Sensitive     CEFEPIME <=0.12 SENSITIVE Sensitive     CEFTAZIDIME <=1 SENSITIVE Sensitive     CEFTRIAXONE <=0.25 SENSITIVE Sensitive     CIPROFLOXACIN <=0.25  SENSITIVE Sensitive     GENTAMICIN <=1 SENSITIVE Sensitive     IMIPENEM <=0.25 SENSITIVE Sensitive     TRIMETH/SULFA <=20 SENSITIVE Sensitive     AMPICILLIN/SULBACTAM <=2 SENSITIVE Sensitive     PIP/TAZO <=4 SENSITIVE Sensitive     * ESCHERICHIA COLI  Blood Culture ID Panel (Reflexed)     Status: Abnormal   Collection Time: 12/16/21  2:02 PM  Result Value Ref Range Status   Enterococcus faecalis NOT DETECTED NOT DETECTED Final   Enterococcus Faecium NOT DETECTED NOT DETECTED Final   Listeria monocytogenes  NOT DETECTED NOT DETECTED Final   Staphylococcus species NOT DETECTED NOT DETECTED Final   Staphylococcus aureus (BCID) NOT DETECTED NOT DETECTED Final   Staphylococcus epidermidis NOT DETECTED NOT DETECTED Final   Staphylococcus lugdunensis NOT DETECTED NOT DETECTED Final   Streptococcus species NOT DETECTED NOT DETECTED Final   Streptococcus agalactiae NOT DETECTED NOT DETECTED Final   Streptococcus pneumoniae NOT DETECTED NOT DETECTED Final   Streptococcus pyogenes NOT DETECTED NOT DETECTED Final   A.calcoaceticus-baumannii NOT DETECTED NOT DETECTED Final   Bacteroides fragilis NOT DETECTED NOT DETECTED Final   Enterobacterales DETECTED (A) NOT DETECTED Final    Comment: Enterobacterales represent a large order of gram negative bacteria, not a single organism. CRITICAL RESULT CALLED TO, READ BACK BY AND VERIFIED WITH: NATHAN BELUTH @ 2992 ON 10/17/2021.Marland KitchenMarland KitchenTKR    Enterobacter cloacae complex NOT DETECTED NOT DETECTED Final   Escherichia coli DETECTED (A) NOT DETECTED Final    Comment: CRITICAL RESULT CALLED TO, READ BACK BY AND VERIFIED WITH: NATHAN BELUTH @ 4268 ON 10/17/2021.Marland KitchenMarland KitchenTKR    Klebsiella aerogenes NOT DETECTED NOT DETECTED Final   Klebsiella oxytoca NOT DETECTED NOT DETECTED Final   Klebsiella pneumoniae NOT DETECTED NOT DETECTED Final   Proteus species NOT DETECTED NOT DETECTED Final   Salmonella species NOT DETECTED NOT DETECTED Final   Serratia marcescens NOT  DETECTED NOT DETECTED Final   Haemophilus influenzae NOT DETECTED NOT DETECTED Final   Neisseria meningitidis NOT DETECTED NOT DETECTED Final   Pseudomonas aeruginosa NOT DETECTED NOT DETECTED Final   Stenotrophomonas maltophilia NOT DETECTED NOT DETECTED Final   Candida albicans NOT DETECTED NOT DETECTED Final   Candida auris NOT DETECTED NOT DETECTED Final   Candida glabrata NOT DETECTED NOT DETECTED Final   Candida krusei NOT DETECTED NOT DETECTED Final   Candida parapsilosis NOT DETECTED NOT DETECTED Final   Candida tropicalis NOT DETECTED NOT DETECTED Final   Cryptococcus neoformans/gattii NOT DETECTED NOT DETECTED Final   CTX-M ESBL NOT DETECTED NOT DETECTED Final   Carbapenem resistance IMP NOT DETECTED NOT DETECTED Final   Carbapenem resistance KPC NOT DETECTED NOT DETECTED Final   Carbapenem resistance NDM NOT DETECTED NOT DETECTED Final   Carbapenem resist OXA 48 LIKE NOT DETECTED NOT DETECTED Final   Carbapenem resistance VIM NOT DETECTED NOT DETECTED Final    Comment: Performed at [facility], [address] [place], [place] [address]  MRSA Next Gen by PCR, Nasal     Status: None   Collection Time: 12/16/21  7:09 PM   Specimen: Nasal Mucosa; Nasal Swab  Result Value Ref Range Status   MRSA by PCR Next Gen NOT DETECTED NOT DETECTED Final    Comment: (NOTE) The GeneXpert MRSA Assay (FDA approved for NASAL specimens only), is one component of a comprehensive MRSA colonization surveillance program. It is not intended to diagnose MRSA infection nor to guide or monitor treatment for MRSA infections. Test performance is not FDA approved in patients less than 59 years old. Performed at [facility], [address] [place], [place] [address]   Urine Culture     Status: Abnormal   Collection Time: 12/16/21  9:21 PM   Specimen: Urine, Random  Result Value Ref Range Status   Specimen Description   Final    URINE, RANDOM Performed at [facility],  [address] [place], [place] [address]    Special Requests   Final    NONE Performed at [facility], [address] [place], [place] [address]  Culture >=100,000 COLONIES/mL ESCHERICHIA COLI (A)  Final   Report Status 12/19/2021 FINAL  Final   Organism ID, Bacteria ESCHERICHIA COLI (A)  Final      Susceptibility   Escherichia coli - MIC*    AMPICILLIN <=2 SENSITIVE Sensitive     CEFAZOLIN <=4 SENSITIVE Sensitive     CEFEPIME <=0.12 SENSITIVE Sensitive     CEFTRIAXONE <=0.25 SENSITIVE Sensitive     CIPROFLOXACIN <=0.25 SENSITIVE Sensitive     GENTAMICIN <=1 SENSITIVE Sensitive     IMIPENEM <=0.25 SENSITIVE Sensitive     NITROFURANTOIN <=16 SENSITIVE Sensitive     TRIMETH/SULFA <=20 SENSITIVE Sensitive     AMPICILLIN/SULBACTAM <=2 SENSITIVE Sensitive     PIP/TAZO <=4 SENSITIVE Sensitive     * >=100,000 COLONIES/mL ESCHERICHIA COLI  Urine Culture     Status: Abnormal   Collection Time: 12/17/21 12:04 PM   Specimen: Urine, Catheterized  Result Value Ref Range Status   Specimen Description URINE, CATHETERIZED  Final   Special Requests   Final    LEFT KIDNEY PCN DRAIN Performed at [facility], [place] [address], [place], [place] [address]    Culture (A)  Final    >=100,000 COLONIES/mL ESCHERICHIA COLI 80,000 COLONIES/mL CITROBACTER FREUNDII    Report Status 12/20/2021 FINAL  Final   Organism ID, Bacteria ESCHERICHIA COLI (A)  Final   Organism ID, Bacteria CITROBACTER FREUNDII (A)  Final      Susceptibility   Citrobacter freundii - MIC*    CEFAZOLIN >=64 RESISTANT Resistant     CEFEPIME <=0.12 SENSITIVE Sensitive     CEFTRIAXONE <=0.25 SENSITIVE Sensitive     CIPROFLOXACIN <=0.25 SENSITIVE Sensitive     GENTAMICIN <=1 SENSITIVE Sensitive     IMIPENEM 0.5 SENSITIVE Sensitive     NITROFURANTOIN <=16 SENSITIVE Sensitive     TRIMETH/SULFA <=20 SENSITIVE Sensitive     PIP/TAZO <=4 SENSITIVE Sensitive     * 80,000 COLONIES/mL CITROBACTER FREUNDII    Escherichia coli - MIC*    AMPICILLIN 8 SENSITIVE Sensitive     CEFAZOLIN <=4 SENSITIVE Sensitive     CEFEPIME <=0.12 SENSITIVE Sensitive     CEFTRIAXONE <=0.25 SENSITIVE Sensitive     CIPROFLOXACIN <=0.25 SENSITIVE Sensitive     GENTAMICIN <=1 SENSITIVE Sensitive     IMIPENEM <=0.25 SENSITIVE Sensitive     NITROFURANTOIN <=16 SENSITIVE Sensitive     TRIMETH/SULFA <=20 SENSITIVE Sensitive     AMPICILLIN/SULBACTAM 4 SENSITIVE Sensitive     PIP/TAZO <=4 SENSITIVE Sensitive     * >=100,000 COLONIES/mL ESCHERICHIA COLI     Labs: BNP (last 3 results) No results for input(s): BNP in the last 8760 hours. Basic Metabolic Panel: Recent Labs  Lab 12/16/21 1402 12/16/21 2121 12/17/21 0425 12/18/21 0640 12/19/21 0522  NA 129* 133* 133* 136 133*  K 5.2* 4.5 4.7 4.0 3.3*  CL 102 109 109 107 104  CO2 10* 10* 11* 15* 17*  GLUCOSE 96 92 77 88 113*  BUN QUANTITY NOT SUFFICIENT, UNABLE TO PERFORM TEST 109* 111* 95* 96*  CREATININE 7.48* 6.75* 7.12* 5.29* 4.40*  CALCIUM 7.5* 6.9* 7.1* 7.2* 7.3*  MG  --   --  2.7*  --   --    Liver Function Tests: Recent Labs  Lab 12/16/21 1402 12/18/21 0640 12/19/21 0522  AST _0 ALT _1 ALKPHOS 164* 133* 167*  BILITOT 1.6* 1.6* 1.2  PROT 7.4 5.3* 5.4*  ALBUMIN 2.1* <1.5* <1.5*   No results  for input(s): LIPASE, AMYLASE in the last 168 hours. No results for input(s): AMMONIA in the last 168 hours. CBC: Recent Labs  Lab 12/16/21 1320 12/17/21 0425 12/17/21 0909 12/17/21 1839 12/18/21 0759 12/19/21 0522  WBC 43.9* 38.0*  --   --  22.2* 15.1*  NEUTROABS 40.7*  --   --   --   --   --   HGB 11.6* 8.7* 8.3* 6.9* 7.7* 7.5*  HCT 38.1 27.4*  --  21.2* 23.7* 23.5*  MCV 97.9 93.8  --   --  91.5 91.8  PLT 215 166  --   --  79* 59*   Cardiac Enzymes: No results for input(s): CKTOTAL, CKMB, CKMBINDEX, TROPONINI in the last 168 hours. BNP: Invalid input(s): POCBNP CBG: Recent Labs  Lab 12/17/21 1954 12/18/21 0751 12/19/21 0740  12/19/21 1131 12/20/21 0810  GLUCAP 84 95 114* 138* 107*   D-Dimer No results for input(s): DDIMER in the last 72 hours. Hgb A1c No results for input(s): HGBA1C in the last 72 hours. Lipid Profile No results for input(s): CHOL, HDL, LDLCALC, TRIG, CHOLHDL, LDLDIRECT in the last 72 hours. Thyroid function studies No results for input(s): TSH, T4TOTAL, T3FREE, THYROIDAB in the last 72 hours.  Invalid input(s): FREET3 Anemia work up No results for input(s): VITAMINB12, FOLATE, FERRITIN, TIBC, IRON, RETICCTPCT in the last 72 hours. Urinalysis    Component Value Date/Time   COLORURINE YELLOW (A) 12/17/2021 1348   APPEARANCEUR TURBID (A) 12/17/2021 1348   LABSPEC 1.013 12/17/2021 1348   PHURINE 8.0 12/17/2021 1348   GLUCOSEU NEGATIVE 12/17/2021 1348   HGBUR MODERATE (A) 12/17/2021 1348   BILIRUBINUR NEGATIVE 12/17/2021 1348   KETONESUR NEGATIVE 12/17/2021 1348   PROTEINUR >=300 (A) 12/17/2021 1348   NITRITE NEGATIVE 12/17/2021 1348   LEUKOCYTESUR MODERATE (A) 12/17/2021 1348   Sepsis Labs Invalid input(s): PROCALCITONIN,  WBC,  LACTICIDVEN Microbiology Recent Results (from the past 240 hour(s))  Culture, blood (Routine x 2)     Status: Abnormal   Collection Time: 12/16/21  1:57 PM   Specimen: BLOOD  Result Value Ref Range Status   Specimen Description   Final    BLOOD LEFT AC Performed at [facility], [address] [place], [place] [address]    Special Requests   Final    BOTTLES DRAWN AEROBIC AND ANAEROBIC Blood Culture adequate volume Performed at [facility], [address] [place], [place] [address]    Culture  Setup Time   Final    GRAM NEGATIVE RODS IN BOTH AEROBIC AND ANAEROBIC BOTTLES CRITICAL VALUE NOTED.  VALUE IS CONSISTENT WITH PREVIOUSLY REPORTED AND CALLED VALUE. GRAM STAIN REVIEWED-AGREE WITH RESULT    Culture (A)  Final    ESCHERICHIA COLI SUSCEPTIBILITIES PERFORMED ON PREVIOUS CULTURE WITHIN THE LAST 5 DAYS. Performed at  [facility], [place] [address] [place], [place] [address]    Report Status 12/19/2021 FINAL  Final  Culture, blood (Routine x 2)     Status: Abnormal   Collection Time: 12/16/21  2:02 PM   Specimen: BLOOD  Result Value Ref Range Status   Specimen Description   Final    BLOOD RIGHT ANTECUBITAL Performed at [facility], [address] [place], [place] [address]    Special Requests   Final    BOTTLES DRAWN AEROBIC AND ANAEROBIC Blood Culture results may not be optimal due to an inadequate volume of blood received in culture bottles Performed at [facility], [address] [place],  [place] [address]    Culture  Setup Time   Final    Organism ID to follow IN BOTH AEROBIC AND ANAEROBIC BOTTLES GRAM NEGATIVE RODS CRITICAL RESULT CALLED TO, READ BACK BY AND VERIFIED WITH: NATHAN BELUTH @ 8768 ON 10/17/2021.Marland KitchenMarland KitchenTKR GRAM STAIN REVIEWED-AGREE WITH RESULT Performed at [facility], [place] [address] [place], [place] [address]    Culture ESCHERICHIA COLI (A)  Final   Report Status 12/19/2021 FINAL  Final   Organism ID, Bacteria ESCHERICHIA COLI  Final      Susceptibility   Escherichia coli - MIC*    AMPICILLIN 8 SENSITIVE Sensitive     CEFAZOLIN <=4 SENSITIVE Sensitive     CEFEPIME <=0.12 SENSITIVE Sensitive     CEFTAZIDIME <=1 SENSITIVE Sensitive     CEFTRIAXONE <=0.25 SENSITIVE Sensitive     CIPROFLOXACIN <=0.25 SENSITIVE Sensitive     GENTAMICIN <=1 SENSITIVE Sensitive     IMIPENEM <=0.25 SENSITIVE Sensitive     TRIMETH/SULFA <=20 SENSITIVE Sensitive     AMPICILLIN/SULBACTAM <=2 SENSITIVE Sensitive     PIP/TAZO <=4 SENSITIVE Sensitive     * ESCHERICHIA COLI  Blood Culture ID Panel (Reflexed)     Status: Abnormal   Collection Time: 12/16/21  2:02 PM  Result Value Ref Range Status   Enterococcus faecalis NOT DETECTED NOT DETECTED Final   Enterococcus Faecium NOT DETECTED NOT DETECTED Final   Listeria monocytogenes NOT DETECTED NOT DETECTED Final    Staphylococcus species NOT DETECTED NOT DETECTED Final   Staphylococcus aureus (BCID) NOT DETECTED NOT DETECTED Final   Staphylococcus epidermidis NOT DETECTED NOT DETECTED Final   Staphylococcus lugdunensis NOT DETECTED NOT DETECTED Final   Streptococcus species NOT DETECTED NOT DETECTED Final   Streptococcus agalactiae NOT DETECTED NOT DETECTED Final   Streptococcus pneumoniae NOT DETECTED NOT DETECTED Final   Streptococcus pyogenes NOT DETECTED NOT DETECTED Final   A.calcoaceticus-baumannii NOT DETECTED NOT DETECTED Final   Bacteroides fragilis NOT DETECTED NOT DETECTED Final   Enterobacterales DETECTED (A) NOT DETECTED Final    Comment: Enterobacterales represent a large order of gram negative bacteria, not a single organism. CRITICAL RESULT CALLED TO, READ BACK BY AND VERIFIED WITH: NATHAN BELUTH @ 6203 ON 10/17/2021.Marland KitchenMarland KitchenTKR    Enterobacter cloacae complex NOT DETECTED NOT DETECTED Final   Escherichia coli DETECTED (A) NOT DETECTED Final    Comment: CRITICAL RESULT CALLED TO, READ BACK BY AND VERIFIED WITH: NATHAN BELUTH @ 5597 ON 10/17/2021.Marland KitchenMarland KitchenTKR    Klebsiella aerogenes NOT DETECTED NOT DETECTED Final   Klebsiella oxytoca NOT DETECTED NOT DETECTED Final   Klebsiella pneumoniae NOT DETECTED NOT DETECTED Final   Proteus species NOT DETECTED NOT DETECTED Final   Salmonella species NOT DETECTED NOT DETECTED Final   Serratia marcescens NOT DETECTED NOT DETECTED Final   Haemophilus influenzae NOT DETECTED NOT DETECTED Final   Neisseria meningitidis NOT DETECTED NOT DETECTED Final   Pseudomonas aeruginosa NOT DETECTED NOT DETECTED Final   Stenotrophomonas maltophilia NOT DETECTED NOT DETECTED Final   Candida albicans NOT DETECTED NOT DETECTED Final   Candida auris NOT DETECTED NOT DETECTED Final   Candida glabrata NOT DETECTED NOT DETECTED Final   Candida krusei NOT DETECTED NOT DETECTED Final   Candida parapsilosis NOT DETECTED NOT DETECTED Final   Candida tropicalis NOT DETECTED NOT  DETECTED Final   Cryptococcus neoformans/gattii NOT DETECTED NOT DETECTED Final   CTX-M ESBL NOT DETECTED NOT DETECTED Final   Carbapenem resistance IMP NOT DETECTED NOT DETECTED Final   Carbapenem resistance KPC NOT DETECTED  NOT DETECTED Final   Carbapenem resistance NDM NOT DETECTED NOT DETECTED Final   Carbapenem resist OXA 48 LIKE NOT DETECTED NOT DETECTED Final   Carbapenem resistance VIM NOT DETECTED NOT DETECTED Final    Comment: Performed at [facility], [address] [place], [place] [address]  MRSA Next Gen by PCR, Nasal     Status: None   Collection Time: 12/16/21  7:09 PM   Specimen: Nasal Mucosa; Nasal Swab  Result Value Ref Range Status   MRSA by PCR Next Gen NOT DETECTED NOT DETECTED Final    Comment: (NOTE) The GeneXpert MRSA Assay (FDA approved for NASAL specimens only), is one component of a comprehensive MRSA colonization surveillance program. It is not intended to diagnose MRSA infection nor to guide or monitor treatment for MRSA infections. Test performance is not FDA approved in patients less than 54 years old. Performed at [facility], [address] [place], [place] [address]   Urine Culture     Status: Abnormal   Collection Time: 12/16/21  9:21 PM   Specimen: Urine, Random  Result Value Ref Range Status   Specimen Description   Final    URINE, RANDOM Performed at [facility], [address] [place], [place] [address]    Special Requests   Final    NONE Performed at [facility], [address] [place], [place] [address]    Culture >=100,000 COLONIES/mL ESCHERICHIA COLI (A)  Final   Report Status 12/19/2021 FINAL  Final   Organism ID, Bacteria ESCHERICHIA COLI (A)  Final      Susceptibility   Escherichia coli - MIC*    AMPICILLIN <=2 SENSITIVE Sensitive     CEFAZOLIN <=4 SENSITIVE Sensitive     CEFEPIME <=0.12 SENSITIVE Sensitive     CEFTRIAXONE <=0.25 SENSITIVE Sensitive     CIPROFLOXACIN <=0.25  SENSITIVE Sensitive     GENTAMICIN <=1 SENSITIVE Sensitive     IMIPENEM <=0.25 SENSITIVE Sensitive     NITROFURANTOIN <=16 SENSITIVE Sensitive     TRIMETH/SULFA <=20 SENSITIVE Sensitive     AMPICILLIN/SULBACTAM <=2 SENSITIVE Sensitive     PIP/TAZO <=4 SENSITIVE Sensitive     * >=100,000 COLONIES/mL ESCHERICHIA COLI  Urine Culture     Status: Abnormal   Collection Time: 12/17/21 12:04 PM   Specimen: Urine, Catheterized  Result Value Ref Range Status   Specimen Description URINE, CATHETERIZED  Final   Special Requests   Final    LEFT KIDNEY PCN DRAIN Performed at [facility], [place] [address] [place], [place] [address]    Culture (A)  Final    >=100,000 COLONIES/mL ESCHERICHIA COLI 80,000 COLONIES/mL CITROBACTER FREUNDII    Report Status 12/20/2021 FINAL  Final   Organism ID, Bacteria ESCHERICHIA COLI (A)  Final   Organism ID, Bacteria CITROBACTER FREUNDII (A)  Final      Susceptibility   Citrobacter freundii - MIC*    CEFAZOLIN >=64 RESISTANT Resistant     CEFEPIME <=0.12 SENSITIVE Sensitive     CEFTRIAXONE <=0.25 SENSITIVE Sensitive     CIPROFLOXACIN <=0.25 SENSITIVE Sensitive     GENTAMICIN <=1 SENSITIVE Sensitive     IMIPENEM 0.5 SENSITIVE Sensitive     NITROFURANTOIN <=16 SENSITIVE Sensitive     TRIMETH/SULFA <=20 SENSITIVE Sensitive     PIP/TAZO <=4 SENSITIVE Sensitive     * 80,000 COLONIES/mL CITROBACTER FREUNDII   Escherichia coli - MIC*    AMPICILLIN 8 SENSITIVE Sensitive     CEFAZOLIN <=4 SENSITIVE  Sensitive     CEFEPIME <=0.12 SENSITIVE Sensitive     CEFTRIAXONE <=0.25 SENSITIVE Sensitive     CIPROFLOXACIN <=0.25 SENSITIVE Sensitive     GENTAMICIN <=1 SENSITIVE Sensitive     IMIPENEM <=0.25 SENSITIVE Sensitive     NITROFURANTOIN <=16 SENSITIVE Sensitive     TRIMETH/SULFA <=20 SENSITIVE Sensitive     AMPICILLIN/SULBACTAM 4 SENSITIVE Sensitive     PIP/TAZO <=4 SENSITIVE Sensitive     * >=100,000 COLONIES/mL ESCHERICHIA COLI     Time coordinating  discharge: Over 30 minutes  SIGNED:   Wyvonnia Dusky, MD  [facility] 12/21/2021, 11:12 AM Pager   If 7PM-7AM, please contact night-coverage

## 2021-12-21 NOTE — Progress Notes (Signed)
[facility] Liaison note:  Notified by Pearson Forster that patient will now be discharging to the care of [facility] in [place], [place].  Hospital Liaison will sign off. Thank you.  Flo Shanks BSN, RN, [place] [phone]

## 2021-12-21 NOTE — Progress Notes (Signed)
Discharge note:  Reviewed discharge instructions with daughter and ex-husband. Both verbalized understanding. Charge nurse deaccessed port on left chest. Pt discharged with all belongings. Bilateral nephrostomy in place.  Staff wheeled pt out. Pt transported to [employer] via private vehicle.

## 2021-12-21 NOTE — TOC Progression Note (Addendum)
Transition of Care [facility]) - Progression Note    Patient Details  Name: Anita Sullivan MRN: [id number] Date of Birth: Mar 16, 1966  Transition [facility]) CM/SW [place], [place] Phone Number: 12/21/2021, 11:02 AM  Clinical Narrative:     CSW lvm with Tommi Emery at [facility] in [place], [place] who will be providing home hospice, informed of dc at [phone]   Ex husband transporting home via private vehicle and hospice is delivering hospital bed.   No further discharge needs.     Expected Discharge Plan and Services           Expected Discharge Date: 12/21/21                                     Social Determinants of Health (SDOH) Interventions    Readmission Risk Interventions     View : No data to display.

## 2021-12-22 ENCOUNTER — Telehealth: Payer: Self-pay

## 2021-12-22 DIAGNOSIS — C189 Malignant neoplasm of colon, unspecified: Secondary | ICD-10-CM | POA: Diagnosis not present

## 2021-12-22 DIAGNOSIS — F419 Anxiety disorder, unspecified: Secondary | ICD-10-CM | POA: Diagnosis not present

## 2021-12-22 DIAGNOSIS — R112 Nausea with vomiting, unspecified: Secondary | ICD-10-CM | POA: Diagnosis not present

## 2021-12-22 DIAGNOSIS — N390 Urinary tract infection, site not specified: Secondary | ICD-10-CM | POA: Diagnosis not present

## 2021-12-22 DIAGNOSIS — C7800 Secondary malignant neoplasm of unspecified lung: Secondary | ICD-10-CM | POA: Diagnosis not present

## 2021-12-22 DIAGNOSIS — D630 Anemia in neoplastic disease: Secondary | ICD-10-CM | POA: Diagnosis not present

## 2021-12-22 DIAGNOSIS — R634 Abnormal weight loss: Secondary | ICD-10-CM | POA: Diagnosis not present

## 2021-12-22 DIAGNOSIS — B9620 Unspecified Escherichia coli [E. coli] as the cause of diseases classified elsewhere: Secondary | ICD-10-CM | POA: Diagnosis not present

## 2021-12-22 DIAGNOSIS — C787 Secondary malignant neoplasm of liver and intrahepatic bile duct: Secondary | ICD-10-CM | POA: Diagnosis not present

## 2021-12-22 DIAGNOSIS — E46 Unspecified protein-calorie malnutrition: Secondary | ICD-10-CM | POA: Diagnosis not present

## 2021-12-22 DIAGNOSIS — N1330 Unspecified hydronephrosis: Secondary | ICD-10-CM | POA: Diagnosis not present

## 2021-12-22 DIAGNOSIS — N139 Obstructive and reflux uropathy, unspecified: Secondary | ICD-10-CM | POA: Diagnosis not present

## 2021-12-22 DIAGNOSIS — N179 Acute kidney failure, unspecified: Secondary | ICD-10-CM | POA: Diagnosis not present

## 2021-12-22 DIAGNOSIS — Z6822 Body mass index (BMI) 22.0-22.9, adult: Secondary | ICD-10-CM | POA: Diagnosis not present

## 2021-12-22 DIAGNOSIS — R627 Adult failure to thrive: Secondary | ICD-10-CM | POA: Diagnosis not present

## 2021-12-22 DIAGNOSIS — N823 Fistula of vagina to large intestine: Secondary | ICD-10-CM | POA: Diagnosis not present

## 2021-12-22 DIAGNOSIS — A419 Sepsis, unspecified organism: Secondary | ICD-10-CM | POA: Diagnosis not present

## 2021-12-22 DIAGNOSIS — R5381 Other malaise: Secondary | ICD-10-CM | POA: Diagnosis not present

## 2021-12-22 DIAGNOSIS — N1832 Chronic kidney disease, stage 3b: Secondary | ICD-10-CM | POA: Diagnosis not present

## 2021-12-22 DIAGNOSIS — G893 Neoplasm related pain (acute) (chronic): Secondary | ICD-10-CM | POA: Diagnosis not present

## 2021-12-22 DIAGNOSIS — I959 Hypotension, unspecified: Secondary | ICD-10-CM | POA: Diagnosis not present

## 2021-12-22 NOTE — Telephone Encounter (Signed)
Transition Care Management Follow-up Telephone Call Date of discharge and from where: 12/21/2021-[facility] How have you been since you were released from the hospital? Pt stated she is doing ok. She was currently sitting with Hospice.  Any questions or concerns? No  Items Reviewed: Did the pt receive and understand the discharge instructions provided? Yes  Medications obtained and verified? Yes  Other? No  Any new allergies since your discharge? No  Dietary orders reviewed? No Do you have support at home? Yes   Home Care and Equipment/Supplies: Were home health services ordered? not applicable If so, what is the name of the agency? N/A  Has the agency set up a time to come to the patient's home? not applicable Were any new equipment or medical supplies ordered?  No What is the name of the medical supply agency? N/A Were you able to get the supplies/equipment? not applicable Do you have any questions related to the use of the equipment or supplies? No  Functional Questionnaire: (I = Independent and D = Dependent) ADLs: I  Bathing/Dressing- D  Meal Prep- D  Eating- D  Maintaining continence- I  Transferring/Ambulation- D  Managing Meds- D  Follow up appointments reviewed:  PCP Hospital f/u appt confirmed? No   Specialist Hospital f/u appt confirmed? Yes  Scheduled to see Urology on 01/14/2022. Are transportation arrangements needed? No  If their condition worsens, is the pt aware to call PCP or go to the Emergency Dept.? Yes Was the patient provided with contact information for the PCP's office or ED? Yes Was to pt encouraged to call back with questions or concerns? Yes

## 2021-12-23 DIAGNOSIS — B9620 Unspecified Escherichia coli [E. coli] as the cause of diseases classified elsewhere: Secondary | ICD-10-CM | POA: Diagnosis not present

## 2021-12-23 DIAGNOSIS — R5381 Other malaise: Secondary | ICD-10-CM | POA: Diagnosis not present

## 2021-12-23 DIAGNOSIS — A419 Sepsis, unspecified organism: Secondary | ICD-10-CM | POA: Diagnosis not present

## 2021-12-23 DIAGNOSIS — D630 Anemia in neoplastic disease: Secondary | ICD-10-CM | POA: Diagnosis not present

## 2021-12-23 DIAGNOSIS — N139 Obstructive and reflux uropathy, unspecified: Secondary | ICD-10-CM | POA: Diagnosis not present

## 2021-12-23 DIAGNOSIS — I959 Hypotension, unspecified: Secondary | ICD-10-CM | POA: Diagnosis not present

## 2021-12-23 DIAGNOSIS — R112 Nausea with vomiting, unspecified: Secondary | ICD-10-CM | POA: Diagnosis not present

## 2021-12-23 DIAGNOSIS — N1832 Chronic kidney disease, stage 3b: Secondary | ICD-10-CM | POA: Diagnosis not present

## 2021-12-23 DIAGNOSIS — G893 Neoplasm related pain (acute) (chronic): Secondary | ICD-10-CM | POA: Diagnosis not present

## 2021-12-23 DIAGNOSIS — N179 Acute kidney failure, unspecified: Secondary | ICD-10-CM | POA: Diagnosis not present

## 2021-12-23 DIAGNOSIS — R627 Adult failure to thrive: Secondary | ICD-10-CM | POA: Diagnosis not present

## 2021-12-23 DIAGNOSIS — N1330 Unspecified hydronephrosis: Secondary | ICD-10-CM | POA: Diagnosis not present

## 2021-12-23 DIAGNOSIS — N823 Fistula of vagina to large intestine: Secondary | ICD-10-CM | POA: Diagnosis not present

## 2021-12-23 DIAGNOSIS — N390 Urinary tract infection, site not specified: Secondary | ICD-10-CM | POA: Diagnosis not present

## 2021-12-23 DIAGNOSIS — C787 Secondary malignant neoplasm of liver and intrahepatic bile duct: Secondary | ICD-10-CM | POA: Diagnosis not present

## 2021-12-23 DIAGNOSIS — C7800 Secondary malignant neoplasm of unspecified lung: Secondary | ICD-10-CM | POA: Diagnosis not present

## 2021-12-23 DIAGNOSIS — C189 Malignant neoplasm of colon, unspecified: Secondary | ICD-10-CM | POA: Diagnosis not present

## 2021-12-23 DIAGNOSIS — F419 Anxiety disorder, unspecified: Secondary | ICD-10-CM | POA: Diagnosis not present

## 2021-12-23 DIAGNOSIS — R634 Abnormal weight loss: Secondary | ICD-10-CM | POA: Diagnosis not present

## 2021-12-23 DIAGNOSIS — Z6822 Body mass index (BMI) 22.0-22.9, adult: Secondary | ICD-10-CM | POA: Diagnosis not present

## 2021-12-23 DIAGNOSIS — E46 Unspecified protein-calorie malnutrition: Secondary | ICD-10-CM | POA: Diagnosis not present

## 2021-12-24 DIAGNOSIS — E46 Unspecified protein-calorie malnutrition: Secondary | ICD-10-CM | POA: Diagnosis not present

## 2021-12-24 DIAGNOSIS — C189 Malignant neoplasm of colon, unspecified: Secondary | ICD-10-CM | POA: Diagnosis not present

## 2021-12-24 DIAGNOSIS — N390 Urinary tract infection, site not specified: Secondary | ICD-10-CM | POA: Diagnosis not present

## 2021-12-24 DIAGNOSIS — F419 Anxiety disorder, unspecified: Secondary | ICD-10-CM | POA: Diagnosis not present

## 2021-12-24 DIAGNOSIS — A419 Sepsis, unspecified organism: Secondary | ICD-10-CM | POA: Diagnosis not present

## 2021-12-24 DIAGNOSIS — N179 Acute kidney failure, unspecified: Secondary | ICD-10-CM | POA: Diagnosis not present

## 2021-12-24 DIAGNOSIS — B9620 Unspecified Escherichia coli [E. coli] as the cause of diseases classified elsewhere: Secondary | ICD-10-CM | POA: Diagnosis not present

## 2021-12-24 DIAGNOSIS — C787 Secondary malignant neoplasm of liver and intrahepatic bile duct: Secondary | ICD-10-CM | POA: Diagnosis not present

## 2021-12-24 DIAGNOSIS — N823 Fistula of vagina to large intestine: Secondary | ICD-10-CM | POA: Diagnosis not present

## 2021-12-24 DIAGNOSIS — R112 Nausea with vomiting, unspecified: Secondary | ICD-10-CM | POA: Diagnosis not present

## 2021-12-24 DIAGNOSIS — R634 Abnormal weight loss: Secondary | ICD-10-CM | POA: Diagnosis not present

## 2021-12-24 DIAGNOSIS — N1330 Unspecified hydronephrosis: Secondary | ICD-10-CM | POA: Diagnosis not present

## 2021-12-24 DIAGNOSIS — C7800 Secondary malignant neoplasm of unspecified lung: Secondary | ICD-10-CM | POA: Diagnosis not present

## 2021-12-24 DIAGNOSIS — I959 Hypotension, unspecified: Secondary | ICD-10-CM | POA: Diagnosis not present

## 2021-12-24 DIAGNOSIS — N139 Obstructive and reflux uropathy, unspecified: Secondary | ICD-10-CM | POA: Diagnosis not present

## 2021-12-24 DIAGNOSIS — D630 Anemia in neoplastic disease: Secondary | ICD-10-CM | POA: Diagnosis not present

## 2021-12-24 DIAGNOSIS — R627 Adult failure to thrive: Secondary | ICD-10-CM | POA: Diagnosis not present

## 2021-12-24 DIAGNOSIS — Z6822 Body mass index (BMI) 22.0-22.9, adult: Secondary | ICD-10-CM | POA: Diagnosis not present

## 2021-12-24 DIAGNOSIS — G893 Neoplasm related pain (acute) (chronic): Secondary | ICD-10-CM | POA: Diagnosis not present

## 2021-12-24 DIAGNOSIS — R5381 Other malaise: Secondary | ICD-10-CM | POA: Diagnosis not present

## 2021-12-24 DIAGNOSIS — N1832 Chronic kidney disease, stage 3b: Secondary | ICD-10-CM | POA: Diagnosis not present

## 2021-12-25 DIAGNOSIS — I959 Hypotension, unspecified: Secondary | ICD-10-CM | POA: Diagnosis not present

## 2021-12-25 DIAGNOSIS — N1832 Chronic kidney disease, stage 3b: Secondary | ICD-10-CM | POA: Diagnosis not present

## 2021-12-25 DIAGNOSIS — N823 Fistula of vagina to large intestine: Secondary | ICD-10-CM | POA: Diagnosis not present

## 2021-12-25 DIAGNOSIS — Z6822 Body mass index (BMI) 22.0-22.9, adult: Secondary | ICD-10-CM | POA: Diagnosis not present

## 2021-12-25 DIAGNOSIS — C7800 Secondary malignant neoplasm of unspecified lung: Secondary | ICD-10-CM | POA: Diagnosis not present

## 2021-12-25 DIAGNOSIS — N1330 Unspecified hydronephrosis: Secondary | ICD-10-CM | POA: Diagnosis not present

## 2021-12-25 DIAGNOSIS — R112 Nausea with vomiting, unspecified: Secondary | ICD-10-CM | POA: Diagnosis not present

## 2021-12-25 DIAGNOSIS — R5381 Other malaise: Secondary | ICD-10-CM | POA: Diagnosis not present

## 2021-12-25 DIAGNOSIS — A419 Sepsis, unspecified organism: Secondary | ICD-10-CM | POA: Diagnosis not present

## 2021-12-25 DIAGNOSIS — D630 Anemia in neoplastic disease: Secondary | ICD-10-CM | POA: Diagnosis not present

## 2021-12-25 DIAGNOSIS — R634 Abnormal weight loss: Secondary | ICD-10-CM | POA: Diagnosis not present

## 2021-12-25 DIAGNOSIS — F419 Anxiety disorder, unspecified: Secondary | ICD-10-CM | POA: Diagnosis not present

## 2021-12-25 DIAGNOSIS — C787 Secondary malignant neoplasm of liver and intrahepatic bile duct: Secondary | ICD-10-CM | POA: Diagnosis not present

## 2021-12-25 DIAGNOSIS — B9620 Unspecified Escherichia coli [E. coli] as the cause of diseases classified elsewhere: Secondary | ICD-10-CM | POA: Diagnosis not present

## 2021-12-25 DIAGNOSIS — N139 Obstructive and reflux uropathy, unspecified: Secondary | ICD-10-CM | POA: Diagnosis not present

## 2021-12-25 DIAGNOSIS — G893 Neoplasm related pain (acute) (chronic): Secondary | ICD-10-CM | POA: Diagnosis not present

## 2021-12-25 DIAGNOSIS — C189 Malignant neoplasm of colon, unspecified: Secondary | ICD-10-CM | POA: Diagnosis not present

## 2021-12-25 DIAGNOSIS — E46 Unspecified protein-calorie malnutrition: Secondary | ICD-10-CM | POA: Diagnosis not present

## 2021-12-25 DIAGNOSIS — N390 Urinary tract infection, site not specified: Secondary | ICD-10-CM | POA: Diagnosis not present

## 2021-12-25 DIAGNOSIS — N179 Acute kidney failure, unspecified: Secondary | ICD-10-CM | POA: Diagnosis not present

## 2021-12-25 DIAGNOSIS — R627 Adult failure to thrive: Secondary | ICD-10-CM | POA: Diagnosis not present

## 2021-12-26 DIAGNOSIS — E46 Unspecified protein-calorie malnutrition: Secondary | ICD-10-CM | POA: Diagnosis not present

## 2021-12-26 DIAGNOSIS — R5381 Other malaise: Secondary | ICD-10-CM | POA: Diagnosis not present

## 2021-12-26 DIAGNOSIS — Z6822 Body mass index (BMI) 22.0-22.9, adult: Secondary | ICD-10-CM | POA: Diagnosis not present

## 2021-12-26 DIAGNOSIS — C787 Secondary malignant neoplasm of liver and intrahepatic bile duct: Secondary | ICD-10-CM | POA: Diagnosis not present

## 2021-12-26 DIAGNOSIS — I959 Hypotension, unspecified: Secondary | ICD-10-CM | POA: Diagnosis not present

## 2021-12-26 DIAGNOSIS — C7800 Secondary malignant neoplasm of unspecified lung: Secondary | ICD-10-CM | POA: Diagnosis not present

## 2021-12-26 DIAGNOSIS — N1330 Unspecified hydronephrosis: Secondary | ICD-10-CM | POA: Diagnosis not present

## 2021-12-26 DIAGNOSIS — D630 Anemia in neoplastic disease: Secondary | ICD-10-CM | POA: Diagnosis not present

## 2021-12-26 DIAGNOSIS — N390 Urinary tract infection, site not specified: Secondary | ICD-10-CM | POA: Diagnosis not present

## 2021-12-26 DIAGNOSIS — A419 Sepsis, unspecified organism: Secondary | ICD-10-CM | POA: Diagnosis not present

## 2021-12-26 DIAGNOSIS — N1832 Chronic kidney disease, stage 3b: Secondary | ICD-10-CM | POA: Diagnosis not present

## 2021-12-26 DIAGNOSIS — R627 Adult failure to thrive: Secondary | ICD-10-CM | POA: Diagnosis not present

## 2021-12-26 DIAGNOSIS — N139 Obstructive and reflux uropathy, unspecified: Secondary | ICD-10-CM | POA: Diagnosis not present

## 2021-12-26 DIAGNOSIS — G893 Neoplasm related pain (acute) (chronic): Secondary | ICD-10-CM | POA: Diagnosis not present

## 2021-12-26 DIAGNOSIS — N823 Fistula of vagina to large intestine: Secondary | ICD-10-CM | POA: Diagnosis not present

## 2021-12-26 DIAGNOSIS — B9620 Unspecified Escherichia coli [E. coli] as the cause of diseases classified elsewhere: Secondary | ICD-10-CM | POA: Diagnosis not present

## 2021-12-26 DIAGNOSIS — N179 Acute kidney failure, unspecified: Secondary | ICD-10-CM | POA: Diagnosis not present

## 2021-12-26 DIAGNOSIS — F419 Anxiety disorder, unspecified: Secondary | ICD-10-CM | POA: Diagnosis not present

## 2021-12-26 DIAGNOSIS — R634 Abnormal weight loss: Secondary | ICD-10-CM | POA: Diagnosis not present

## 2021-12-26 DIAGNOSIS — R112 Nausea with vomiting, unspecified: Secondary | ICD-10-CM | POA: Diagnosis not present

## 2021-12-26 DIAGNOSIS — C189 Malignant neoplasm of colon, unspecified: Secondary | ICD-10-CM | POA: Diagnosis not present

## 2021-12-27 DIAGNOSIS — C787 Secondary malignant neoplasm of liver and intrahepatic bile duct: Secondary | ICD-10-CM | POA: Diagnosis not present

## 2021-12-27 DIAGNOSIS — N179 Acute kidney failure, unspecified: Secondary | ICD-10-CM | POA: Diagnosis not present

## 2021-12-27 DIAGNOSIS — I959 Hypotension, unspecified: Secondary | ICD-10-CM | POA: Diagnosis not present

## 2021-12-27 DIAGNOSIS — R112 Nausea with vomiting, unspecified: Secondary | ICD-10-CM | POA: Diagnosis not present

## 2021-12-27 DIAGNOSIS — F419 Anxiety disorder, unspecified: Secondary | ICD-10-CM | POA: Diagnosis not present

## 2021-12-27 DIAGNOSIS — R634 Abnormal weight loss: Secondary | ICD-10-CM | POA: Diagnosis not present

## 2021-12-27 DIAGNOSIS — C189 Malignant neoplasm of colon, unspecified: Secondary | ICD-10-CM | POA: Diagnosis not present

## 2021-12-27 DIAGNOSIS — R5381 Other malaise: Secondary | ICD-10-CM | POA: Diagnosis not present

## 2021-12-27 DIAGNOSIS — B9620 Unspecified Escherichia coli [E. coli] as the cause of diseases classified elsewhere: Secondary | ICD-10-CM | POA: Diagnosis not present

## 2021-12-27 DIAGNOSIS — R627 Adult failure to thrive: Secondary | ICD-10-CM | POA: Diagnosis not present

## 2021-12-27 DIAGNOSIS — N1832 Chronic kidney disease, stage 3b: Secondary | ICD-10-CM | POA: Diagnosis not present

## 2021-12-27 DIAGNOSIS — N390 Urinary tract infection, site not specified: Secondary | ICD-10-CM | POA: Diagnosis not present

## 2021-12-27 DIAGNOSIS — N139 Obstructive and reflux uropathy, unspecified: Secondary | ICD-10-CM | POA: Diagnosis not present

## 2021-12-27 DIAGNOSIS — Z6822 Body mass index (BMI) 22.0-22.9, adult: Secondary | ICD-10-CM | POA: Diagnosis not present

## 2021-12-27 DIAGNOSIS — N823 Fistula of vagina to large intestine: Secondary | ICD-10-CM | POA: Diagnosis not present

## 2021-12-27 DIAGNOSIS — C7800 Secondary malignant neoplasm of unspecified lung: Secondary | ICD-10-CM | POA: Diagnosis not present

## 2021-12-27 DIAGNOSIS — G893 Neoplasm related pain (acute) (chronic): Secondary | ICD-10-CM | POA: Diagnosis not present

## 2021-12-27 DIAGNOSIS — D630 Anemia in neoplastic disease: Secondary | ICD-10-CM | POA: Diagnosis not present

## 2021-12-27 DIAGNOSIS — A419 Sepsis, unspecified organism: Secondary | ICD-10-CM | POA: Diagnosis not present

## 2021-12-27 DIAGNOSIS — N1330 Unspecified hydronephrosis: Secondary | ICD-10-CM | POA: Diagnosis not present

## 2021-12-27 DIAGNOSIS — E46 Unspecified protein-calorie malnutrition: Secondary | ICD-10-CM | POA: Diagnosis not present

## 2021-12-28 DIAGNOSIS — N823 Fistula of vagina to large intestine: Secondary | ICD-10-CM | POA: Diagnosis not present

## 2021-12-28 DIAGNOSIS — R5381 Other malaise: Secondary | ICD-10-CM | POA: Diagnosis not present

## 2021-12-28 DIAGNOSIS — F419 Anxiety disorder, unspecified: Secondary | ICD-10-CM | POA: Diagnosis not present

## 2021-12-28 DIAGNOSIS — N390 Urinary tract infection, site not specified: Secondary | ICD-10-CM | POA: Diagnosis not present

## 2021-12-28 DIAGNOSIS — I959 Hypotension, unspecified: Secondary | ICD-10-CM | POA: Diagnosis not present

## 2021-12-28 DIAGNOSIS — R112 Nausea with vomiting, unspecified: Secondary | ICD-10-CM | POA: Diagnosis not present

## 2021-12-28 DIAGNOSIS — Z6822 Body mass index (BMI) 22.0-22.9, adult: Secondary | ICD-10-CM | POA: Diagnosis not present

## 2021-12-28 DIAGNOSIS — N139 Obstructive and reflux uropathy, unspecified: Secondary | ICD-10-CM | POA: Diagnosis not present

## 2021-12-28 DIAGNOSIS — R634 Abnormal weight loss: Secondary | ICD-10-CM | POA: Diagnosis not present

## 2021-12-28 DIAGNOSIS — C189 Malignant neoplasm of colon, unspecified: Secondary | ICD-10-CM | POA: Diagnosis not present

## 2021-12-28 DIAGNOSIS — N1330 Unspecified hydronephrosis: Secondary | ICD-10-CM | POA: Diagnosis not present

## 2021-12-28 DIAGNOSIS — C787 Secondary malignant neoplasm of liver and intrahepatic bile duct: Secondary | ICD-10-CM | POA: Diagnosis not present

## 2021-12-28 DIAGNOSIS — N179 Acute kidney failure, unspecified: Secondary | ICD-10-CM | POA: Diagnosis not present

## 2021-12-28 DIAGNOSIS — C7800 Secondary malignant neoplasm of unspecified lung: Secondary | ICD-10-CM | POA: Diagnosis not present

## 2021-12-28 DIAGNOSIS — E46 Unspecified protein-calorie malnutrition: Secondary | ICD-10-CM | POA: Diagnosis not present

## 2021-12-28 DIAGNOSIS — N1832 Chronic kidney disease, stage 3b: Secondary | ICD-10-CM | POA: Diagnosis not present

## 2021-12-28 DIAGNOSIS — A419 Sepsis, unspecified organism: Secondary | ICD-10-CM | POA: Diagnosis not present

## 2021-12-28 DIAGNOSIS — B9620 Unspecified Escherichia coli [E. coli] as the cause of diseases classified elsewhere: Secondary | ICD-10-CM | POA: Diagnosis not present

## 2021-12-28 DIAGNOSIS — G893 Neoplasm related pain (acute) (chronic): Secondary | ICD-10-CM | POA: Diagnosis not present

## 2021-12-28 DIAGNOSIS — D630 Anemia in neoplastic disease: Secondary | ICD-10-CM | POA: Diagnosis not present

## 2021-12-28 DIAGNOSIS — R627 Adult failure to thrive: Secondary | ICD-10-CM | POA: Diagnosis not present

## 2021-12-29 DIAGNOSIS — A419 Sepsis, unspecified organism: Secondary | ICD-10-CM | POA: Diagnosis not present

## 2021-12-29 DIAGNOSIS — E46 Unspecified protein-calorie malnutrition: Secondary | ICD-10-CM | POA: Diagnosis not present

## 2021-12-29 DIAGNOSIS — N1330 Unspecified hydronephrosis: Secondary | ICD-10-CM | POA: Diagnosis not present

## 2021-12-29 DIAGNOSIS — N179 Acute kidney failure, unspecified: Secondary | ICD-10-CM | POA: Diagnosis not present

## 2021-12-29 DIAGNOSIS — R627 Adult failure to thrive: Secondary | ICD-10-CM | POA: Diagnosis not present

## 2021-12-29 DIAGNOSIS — Z6822 Body mass index (BMI) 22.0-22.9, adult: Secondary | ICD-10-CM | POA: Diagnosis not present

## 2021-12-29 DIAGNOSIS — R112 Nausea with vomiting, unspecified: Secondary | ICD-10-CM | POA: Diagnosis not present

## 2021-12-29 DIAGNOSIS — G893 Neoplasm related pain (acute) (chronic): Secondary | ICD-10-CM | POA: Diagnosis not present

## 2021-12-29 DIAGNOSIS — C787 Secondary malignant neoplasm of liver and intrahepatic bile duct: Secondary | ICD-10-CM | POA: Diagnosis not present

## 2021-12-29 DIAGNOSIS — R634 Abnormal weight loss: Secondary | ICD-10-CM | POA: Diagnosis not present

## 2021-12-29 DIAGNOSIS — I959 Hypotension, unspecified: Secondary | ICD-10-CM | POA: Diagnosis not present

## 2021-12-29 DIAGNOSIS — F419 Anxiety disorder, unspecified: Secondary | ICD-10-CM | POA: Diagnosis not present

## 2021-12-29 DIAGNOSIS — N390 Urinary tract infection, site not specified: Secondary | ICD-10-CM | POA: Diagnosis not present

## 2021-12-29 DIAGNOSIS — C189 Malignant neoplasm of colon, unspecified: Secondary | ICD-10-CM | POA: Diagnosis not present

## 2021-12-29 DIAGNOSIS — N1832 Chronic kidney disease, stage 3b: Secondary | ICD-10-CM | POA: Diagnosis not present

## 2021-12-29 DIAGNOSIS — C7800 Secondary malignant neoplasm of unspecified lung: Secondary | ICD-10-CM | POA: Diagnosis not present

## 2021-12-29 DIAGNOSIS — N823 Fistula of vagina to large intestine: Secondary | ICD-10-CM | POA: Diagnosis not present

## 2021-12-29 DIAGNOSIS — R5381 Other malaise: Secondary | ICD-10-CM | POA: Diagnosis not present

## 2021-12-29 DIAGNOSIS — N139 Obstructive and reflux uropathy, unspecified: Secondary | ICD-10-CM | POA: Diagnosis not present

## 2021-12-29 DIAGNOSIS — D630 Anemia in neoplastic disease: Secondary | ICD-10-CM | POA: Diagnosis not present

## 2021-12-29 DIAGNOSIS — B9620 Unspecified Escherichia coli [E. coli] as the cause of diseases classified elsewhere: Secondary | ICD-10-CM | POA: Diagnosis not present

## 2021-12-30 DIAGNOSIS — N1330 Unspecified hydronephrosis: Secondary | ICD-10-CM | POA: Diagnosis not present

## 2021-12-30 DIAGNOSIS — B9620 Unspecified Escherichia coli [E. coli] as the cause of diseases classified elsewhere: Secondary | ICD-10-CM | POA: Diagnosis not present

## 2021-12-30 DIAGNOSIS — N1832 Chronic kidney disease, stage 3b: Secondary | ICD-10-CM | POA: Diagnosis not present

## 2021-12-30 DIAGNOSIS — C787 Secondary malignant neoplasm of liver and intrahepatic bile duct: Secondary | ICD-10-CM | POA: Diagnosis not present

## 2021-12-30 DIAGNOSIS — N823 Fistula of vagina to large intestine: Secondary | ICD-10-CM | POA: Diagnosis not present

## 2021-12-30 DIAGNOSIS — R112 Nausea with vomiting, unspecified: Secondary | ICD-10-CM | POA: Diagnosis not present

## 2021-12-30 DIAGNOSIS — F419 Anxiety disorder, unspecified: Secondary | ICD-10-CM | POA: Diagnosis not present

## 2021-12-30 DIAGNOSIS — D630 Anemia in neoplastic disease: Secondary | ICD-10-CM | POA: Diagnosis not present

## 2021-12-30 DIAGNOSIS — N179 Acute kidney failure, unspecified: Secondary | ICD-10-CM | POA: Diagnosis not present

## 2021-12-30 DIAGNOSIS — C7800 Secondary malignant neoplasm of unspecified lung: Secondary | ICD-10-CM | POA: Diagnosis not present

## 2021-12-30 DIAGNOSIS — I959 Hypotension, unspecified: Secondary | ICD-10-CM | POA: Diagnosis not present

## 2021-12-30 DIAGNOSIS — N139 Obstructive and reflux uropathy, unspecified: Secondary | ICD-10-CM | POA: Diagnosis not present

## 2021-12-30 DIAGNOSIS — R5381 Other malaise: Secondary | ICD-10-CM | POA: Diagnosis not present

## 2021-12-30 DIAGNOSIS — A419 Sepsis, unspecified organism: Secondary | ICD-10-CM | POA: Diagnosis not present

## 2021-12-30 DIAGNOSIS — N390 Urinary tract infection, site not specified: Secondary | ICD-10-CM | POA: Diagnosis not present

## 2021-12-30 DIAGNOSIS — R634 Abnormal weight loss: Secondary | ICD-10-CM | POA: Diagnosis not present

## 2021-12-30 DIAGNOSIS — G893 Neoplasm related pain (acute) (chronic): Secondary | ICD-10-CM | POA: Diagnosis not present

## 2021-12-30 DIAGNOSIS — E46 Unspecified protein-calorie malnutrition: Secondary | ICD-10-CM | POA: Diagnosis not present

## 2021-12-30 DIAGNOSIS — Z6822 Body mass index (BMI) 22.0-22.9, adult: Secondary | ICD-10-CM | POA: Diagnosis not present

## 2021-12-30 DIAGNOSIS — R627 Adult failure to thrive: Secondary | ICD-10-CM | POA: Diagnosis not present

## 2021-12-30 DIAGNOSIS — C189 Malignant neoplasm of colon, unspecified: Secondary | ICD-10-CM | POA: Diagnosis not present

## 2022-01-05 ENCOUNTER — Telehealth: Payer: Self-pay | Admitting: *Deleted

## 2022-01-05 NOTE — Telephone Encounter (Signed)
Patient called reporting that she has a CT ordered for 6/20 at [facility], but she is now living in [place] close to [place] and is asking if we could arrange for her to get CT done there. Please advise

## 2022-01-05 NOTE — Telephone Encounter (Signed)
I spoke with patient and she said that she will keep appointment as scheduled She then conformed her appointment with Dr Grayland Ormond 6/15 and said she was writing it all down

## 2022-01-05 NOTE — Telephone Encounter (Signed)
Pt aware of response and expressed understanding

## 2022-01-12 ENCOUNTER — Telehealth: Payer: Self-pay | Admitting: Oncology

## 2022-01-12 NOTE — Telephone Encounter (Signed)
Called patient to let her know. Anita Sullivan is willing to stay with Dr. Grayland Ormond and make the drive. Anita Sullivan wants to keep all scheduled appointments with Dr. Grayland Ormond for the future.

## 2022-01-12 NOTE — Telephone Encounter (Signed)
Patient called and has moved to [employer]. she would like her scan to be moved to a facility closer to her so she dont have to drive 5 hours to get here. She wants to know if Dr. Grayland Ormond can move the appointment to there.

## 2022-01-12 NOTE — Progress Notes (Signed)
[place]  Telephone:(336) [phone] Fax:(336) [phone]  ID: Anita Sullivan OB: 1965/11/09  MR#: [id number]  BZJ#:[id number]  Patient Care Team: Pcp, No as PCP - General Grayland Ormond Kathlene November, MD as Consulting Physician (Oncology) [name], RN as Oncology Nurse Navigator  CHIEF COMPLAINT: Progressive metastatic colorectal adenocarcinoma, KRAS wild-type.  Now on hospice.  INTERVAL HISTORY: Patient returns to clinic today for repeat laboratory work and further evaluation.  She is currently enrolled in hospice and has moved to [place] to live with her ex-husband and his wife.  She continues to have weakness and fatigue and a decreased performance status.  Her pain is well controlled on her current narcotics.  She has a fair appetite, but reports weight gain.  She continues to have a mild peripheral neuropathy, but no other neurologic complaints.  She denies any chest pain, shortness of breath, cough, or hemoptysis.  She denies any nausea, vomiting, constipation, or diarrhea.  She has no urinary complaints.  Patient offers no further specific complaints today.  REVIEW OF SYSTEMS:   Review of Systems  Constitutional:  Positive for malaise/fatigue. Negative for fever and weight loss.  HENT: Negative.  Negative for congestion.   Respiratory: Negative.  Negative for cough, hemoptysis and shortness of breath.   Cardiovascular: Negative.  Negative for chest pain and leg swelling.  Gastrointestinal:  Positive for abdominal pain. Negative for blood in stool, constipation, nausea and vomiting.  Genitourinary: Negative.  Negative for flank pain and hematuria.  Musculoskeletal: Negative.  Negative for back pain.  Skin: Negative.  Negative for rash.  Neurological:  Positive for tingling, sensory change and weakness. Negative for dizziness, seizures and headaches.  Psychiatric/Behavioral:  The patient is not nervous/anxious.     As per HPI. Otherwise, a complete review of  systems is negative.  PAST MEDICAL HISTORY: Past Medical History:  Diagnosis Date   Cancer associated pain    Colon cancer ([place])    Family history of pancreatic cancer    Family history of uterine cancer     PAST SURGICAL HISTORY: Past Surgical History:  Procedure Laterality Date   COLONOSCOPY WITH PROPOFOL N/A 05/27/2020   Procedure: COLONOSCOPY WITH PROPOFOL;  Surgeon: [name], MD;  Location: [facility] ENDOSCOPY;  Service: Gastroenterology;  Laterality: N/A;   FLEXIBLE SIGMOIDOSCOPY N/A 05/28/2020   Procedure: FLEXIBLE SIGMOIDOSCOPY;  Surgeon: [name], MD;  Location: [facility] ENDOSCOPY;  Service: Gastroenterology;  Laterality: N/A;   IR 3D INDEPENDENT WKST  06/09/2021   IR ANGIOGRAM SELECTIVE EACH ADDITIONAL VESSEL  06/09/2021   IR ANGIOGRAM SELECTIVE EACH ADDITIONAL VESSEL  06/09/2021   IR ANGIOGRAM SELECTIVE EACH ADDITIONAL VESSEL  06/09/2021   IR ANGIOGRAM SELECTIVE EACH ADDITIONAL VESSEL  08/18/2021   IR ANGIOGRAM SELECTIVE EACH ADDITIONAL VESSEL  08/18/2021   IR ANGIOGRAM SELECTIVE EACH ADDITIONAL VESSEL  08/18/2021   IR ANGIOGRAM SELECTIVE EACH ADDITIONAL VESSEL  10/20/2021   IR ANGIOGRAM SELECTIVE EACH ADDITIONAL VESSEL  10/20/2021   IR ANGIOGRAM VISCERAL SELECTIVE  06/09/2021   IR ANGIOGRAM VISCERAL SELECTIVE  08/18/2021   IR ANGIOGRAM VISCERAL SELECTIVE  09/01/2021   IR ANGIOGRAM VISCERAL SELECTIVE  10/20/2021   IR EMBO TUMOR ORGAN ISCHEMIA INFARCT INC GUIDE ROADMAPPING  09/01/2021   IR EMBO TUMOR ORGAN ISCHEMIA INFARCT INC GUIDE ROADMAPPING  10/20/2021   IR NEPHROSTOMY EXCHANGE LEFT  07/11/2020   IR NEPHROSTOMY EXCHANGE RIGHT  07/11/2020   IR NEPHROSTOMY PLACEMENT LEFT  05/23/2020   IR NEPHROSTOMY PLACEMENT LEFT  12/17/2021   IR NEPHROSTOMY  PLACEMENT RIGHT  05/23/2020   IR NEPHROSTOMY PLACEMENT RIGHT  12/17/2021   IR RADIOLOGIST EVAL & MGMT  04/28/2021   IR URETERAL STENT LEFT NEW ACCESS W/O SEP NEPHROSTOMY CATH  08/15/2020   IR URETERAL STENT RIGHT NEW ACCESS W/O SEP  NEPHROSTOMY CATH  08/15/2020   IR US GUIDE VASC ACCESS RIGHT  06/09/2021   IR US GUIDE VASC ACCESS RIGHT  08/18/2021   IR US GUIDE VASC ACCESS RIGHT  09/01/2021   IR US GUIDE VASC ACCESS RIGHT  10/20/2021   PORTACATH PLACEMENT Left 05/29/2020   Procedure: INSERTION PORT-A-CATH;  Surgeon: [name], MD;  Location: [facility] ORS;  Service: General;  Laterality: Left;    FAMILY HISTORY: Family History  Problem Relation Age of Onset   Other Mother        Corticobasal degeneration   Uterine cancer Mother    Diabetes Maternal Grandmother    Stroke Maternal Grandfather    Stroke Paternal Grandfather    Heart disease Father    Pancreatic cancer Maternal Uncle 78   Cancer Paternal Aunt        unk type   Liver cancer Maternal Uncle    Pancreatic cancer Cousin    Cancer Cousin        unk type    ADVANCED DIRECTIVES (Y/N):  N  HEALTH MAINTENANCE: Social History   Tobacco Use   Smoking status: Former    Types: Cigarettes   Smokeless tobacco: Never  Vaping Use   Vaping Use: Never used  Substance Use Topics   Alcohol use: Never   Drug use: Never     Colonoscopy:  PAP:  Bone density:  Lipid panel:  No Known Allergies  Current Outpatient Medications  Medication Sig Dispense Refill   acetaminophen (TYLENOL) 325 MG tablet Take 650 mg by mouth every 6 (six) hours as needed.     lidocaine-prilocaine (EMLA) cream Apply 1 application topically as needed. Apply prior to appointment. Cover with plastic wrap. 30 g 2   Loperamide HCl (IMODIUM PO) Take by mouth.     loratadine (CLARITIN) 10 MG tablet Take 10 mg by mouth daily.     ondansetron (ZOFRAN) 4 MG tablet Take 1 tablet (4 mg total) by mouth every 8 (eight) hours as needed for nausea or vomiting. 60 tablet 2   pantoprazole (PROTONIX) 40 MG tablet Take 1 tablet (40 mg total) by mouth 2 (two) times daily. 60 tablet 0   No current facility-administered medications for this visit.    OBJECTIVE: There were no vitals filed for this  visit.    There is no height or weight on file to calculate BMI.    ECOG FS:3 - Symptomatic, >50% confined to bed  General: Ill-appearing, no acute distress.  Sitting in a wheelchair. Eyes: Pink conjunctiva, anicteric sclera. HEENT: Normocephalic, moist mucous membranes. Lungs: No audible wheezing or coughing. Heart: Regular rate and rhythm. Abdomen: Soft, nontender, no obvious distention. Musculoskeletal: No edema, cyanosis, or clubbing. Neuro: Alert, answering all questions appropriately. Cranial nerves grossly intact. Skin: No rashes or petechiae noted. Psych: Normal affect.  LAB RESULTS:  Lab Results  Component Value Date   NA 133 (L) 01/14/2022   K 4.9 01/14/2022   CL 97 (L) 01/14/2022   CO2 29 01/14/2022   GLUCOSE 103 (H) 01/14/2022   BUN 23 (H) 01/14/2022   CREATININE 1.51 (H) 01/14/2022   CALCIUM 7.9 (L) 01/14/2022   PROT 7.2 01/14/2022   ALBUMIN 1.9 (L) 01/14/2022   AST 24 01/14/2022  ALT 12 01/14/2022   ALKPHOS 192 (H) 01/14/2022   BILITOT 0.6 01/14/2022   GFRNONAA 41 (L) 01/14/2022    Lab Results  Component Value Date   WBC 17.3 (H) 01/14/2022   NEUTROABS 14.1 (H) 01/14/2022   HGB 8.6 (L) 01/14/2022   HCT 28.4 (L) 01/14/2022   MCV 106.0 (H) 01/14/2022   PLT 368 01/14/2022     STUDIES: IR NEPHROSTOMY PLACEMENT LEFT  Result Date: 12/17/2021 INDICATION: Urosepsis, hydronephrosis with encrusted internal ureteral stents EXAM: 1. Ultrasound-guided puncture of left renal calyx 2. Left antegrade nephrostogram through percutaneous access 3. Placement of a left percutaneous nephrostomy tube using fluoroscopic guidance 4. Ultrasound-guided puncture of right renal calyx 5. Right antegrade nephrostogram through percutaneous access 6. Placement of right percutaneous nephrostomy tube using fluoroscopic guidance COMPARISON:  None Available. MEDICATIONS: Per EMR ANESTHESIA/SEDATION: Moderate (conscious) sedation was employed during this procedure. A total of Versed 1 mg  and Fentanyl 25 mcg was administered intravenously by the radiology nurse. Total intra-service moderate Sedation Time: 28 minutes. The patient's level of consciousness and vital signs were monitored continuously by radiology nursing throughout the procedure under my direct supervision. CONTRAST:  12 mL-administered into the collecting system(s) FLUOROSCOPY TIME:  Fluoroscopy Time: 4.1 minutes (17 mGy) COMPLICATIONS: None immediate. PROCEDURE: Informed written consent was obtained from the patient after a thorough discussion of the procedural risks, benefits and alternatives. All questions were addressed. Maximal Sterile Barrier Technique was utilized including caps, mask, sterile gowns, sterile gloves, sterile drape, hand hygiene and skin antiseptic. A timeout was performed prior to the initiation of the procedure. The patient was placed prone on the exam table. The left flank was prepped and draped in the standard sterile fashion. Ultrasound was used to evaluate the left kidney, which demonstrated hydronephrosis with echogenic urine concerning for pyonephrosis. Skin entry site was marked, and local analgesia was obtained with 1% lidocaine. Using ultrasound guidance, an appropriate lower pole posterior calyx was punctured using a 21-gauge Chiba needle. Entry into the collecting system was confirmed with return of purulent urine. An 018 wire was advanced through the needle into the renal pelvis and proximal ureter, followed by placement of a transition dilator. An antegrade nephrostogram was then performed, which demonstrated hydronephrosis with heterogeneous filling of the renal pelvis. Over an 035 Amplatz wire, the percutaneous tract was serially dilated, and a 10.2 French nephrostomy tube was advanced with the loop formed in the renal pelvis. Appropriate positioning of the nephrostomy tube was confirmed with injection of contrast material. The nephrostomy tube was secured to skin using silk suture and a dressing.  It was attached to bag drainage. The patient was placed prone on the exam table. The right flank was prepped and draped in the standard sterile fashion. Ultrasound was used to evaluate the right kidney, which demonstrated hydronephrosis. Skin entry site was marked, and local analgesia was obtained with 1% lidocaine. Using ultrasound guidance, an appropriate lower pole posterior calyx was punctured using a 21-gauge Chiba needle. Entry into the collecting system was confirmed with return of urine. An 018 wire was advanced through the needle into the renal pelvis and proximal ureter, followed by placement of a transition dilator. An antegrade nephrostogram was then performed, which demonstrated hydronephrosis. Over an 035 Amplatz wire, the percutaneous tract was serially dilated, and a 10.2 French nephrostomy tube was advanced with the loop formed in the renal pelvis. Appropriate positioning of the nephrostomy tube was confirmed with injection of contrast material. The nephrostomy tube was secured to skin using  silk suture and a dressing. It was attached to bag drainage. The patient tolerated the procedure well without immediate complication. IMPRESSION: 1. Successful placement of 10.2 French left percutaneous nephrostomy tube using ultrasound and fluoroscopic guidance. A sample of grossly purulent urine was sent to the lab for microbiology analysis. 2. Successful placement of a 10.2 French right percutaneous nephrostomy tube using ultrasound and fluoroscopic guidance. 3. Nephrostomy tubes placed to bag drainage. 4. Patient to return to Interventional Radiology in 6 weeks for routine drain care. Further long-term management per Urology. Electronically Signed   By: Albin Felling M.D.   On: 12/17/2021 15:41   IR NEPHROSTOMY PLACEMENT RIGHT  Result Date: 12/17/2021 INDICATION: Urosepsis, hydronephrosis with encrusted internal ureteral stents EXAM: 1. Ultrasound-guided puncture of left renal calyx 2. Left antegrade  nephrostogram through percutaneous access 3. Placement of a left percutaneous nephrostomy tube using fluoroscopic guidance 4. Ultrasound-guided puncture of right renal calyx 5. Right antegrade nephrostogram through percutaneous access 6. Placement of right percutaneous nephrostomy tube using fluoroscopic guidance COMPARISON:  None Available. MEDICATIONS: Per EMR ANESTHESIA/SEDATION: Moderate (conscious) sedation was employed during this procedure. A total of Versed 1 mg and Fentanyl 25 mcg was administered intravenously by the radiology nurse. Total intra-service moderate Sedation Time: 28 minutes. The patient's level of consciousness and vital signs were monitored continuously by radiology nursing throughout the procedure under my direct supervision. CONTRAST:  12 mL-administered into the collecting system(s) FLUOROSCOPY TIME:  Fluoroscopy Time: 4.1 minutes (17 mGy) COMPLICATIONS: None immediate. PROCEDURE: Informed written consent was obtained from the patient after a thorough discussion of the procedural risks, benefits and alternatives. All questions were addressed. Maximal Sterile Barrier Technique was utilized including caps, mask, sterile gowns, sterile gloves, sterile drape, hand hygiene and skin antiseptic. A timeout was performed prior to the initiation of the procedure. The patient was placed prone on the exam table. The left flank was prepped and draped in the standard sterile fashion. Ultrasound was used to evaluate the left kidney, which demonstrated hydronephrosis with echogenic urine concerning for pyonephrosis. Skin entry site was marked, and local analgesia was obtained with 1% lidocaine. Using ultrasound guidance, an appropriate lower pole posterior calyx was punctured using a 21-gauge Chiba needle. Entry into the collecting system was confirmed with return of purulent urine. An 018 wire was advanced through the needle into the renal pelvis and proximal ureter, followed by placement of a  transition dilator. An antegrade nephrostogram was then performed, which demonstrated hydronephrosis with heterogeneous filling of the renal pelvis. Over an 035 Amplatz wire, the percutaneous tract was serially dilated, and a 10.2 French nephrostomy tube was advanced with the loop formed in the renal pelvis. Appropriate positioning of the nephrostomy tube was confirmed with injection of contrast material. The nephrostomy tube was secured to skin using silk suture and a dressing. It was attached to bag drainage. The patient was placed prone on the exam table. The right flank was prepped and draped in the standard sterile fashion. Ultrasound was used to evaluate the right kidney, which demonstrated hydronephrosis. Skin entry site was marked, and local analgesia was obtained with 1% lidocaine. Using ultrasound guidance, an appropriate lower pole posterior calyx was punctured using a 21-gauge Chiba needle. Entry into the collecting system was confirmed with return of urine. An 018 wire was advanced through the needle into the renal pelvis and proximal ureter, followed by placement of a transition dilator. An antegrade nephrostogram was then performed, which demonstrated hydronephrosis. Over an 035 Amplatz wire, the percutaneous tract was  serially dilated, and a 10.2 French nephrostomy tube was advanced with the loop formed in the renal pelvis. Appropriate positioning of the nephrostomy tube was confirmed with injection of contrast material. The nephrostomy tube was secured to skin using silk suture and a dressing. It was attached to bag drainage. The patient tolerated the procedure well without immediate complication. IMPRESSION: 1. Successful placement of 10.2 French left percutaneous nephrostomy tube using ultrasound and fluoroscopic guidance. A sample of grossly purulent urine was sent to the lab for microbiology analysis. 2. Successful placement of a 10.2 French right percutaneous nephrostomy tube using ultrasound  and fluoroscopic guidance. 3. Nephrostomy tubes placed to bag drainage. 4. Patient to return to Interventional Radiology in 6 weeks for routine drain care. Further long-term management per Urology. Electronically Signed   By: Albin Felling M.D.   On: 12/17/2021 15:41   DG Chest 1 View  Result Date: 12/16/2021 CLINICAL DATA:  Central line placement EXAM: CHEST  1 VIEW COMPARISON:  12/16/2021 FINDINGS: Single frontal view of the chest demonstrates stable left chest wall port tip within the superior vena cava. Right internal jugular catheter tip overlies superior vena cava. Cardiac silhouette is unremarkable. No airspace disease, effusion, or pneumothorax. No acute bony abnormalities. IMPRESSION: 1. No complication after right internal jugular catheter placement. 2. No acute intrathoracic process. Electronically Signed   By: Randa Ngo M.D.   On: 12/16/2021 21:30   DG Chest 1 View  Result Date: 12/16/2021 CLINICAL DATA:  Evaluate Port-A-Cath position. EXAM: CHEST  1 VIEW COMPARISON:  Chest CT 04/14/2021 FINDINGS: The left IJ Port-A-Cath appears to be in stable position when compared to the prior chest CT. It is directed slightly posteriorly but this is within the SVC on the chest CT and not in the azygous vein. IMPRESSION: Left IJ Port-A-Cath in stable position. Its tip is in the SVC based on the prior chest CT. Electronically Signed   By: Marijo Sanes M.D.   On: 12/16/2021 19:00   CT ABDOMEN PELVIS WO CONTRAST  Result Date: 12/16/2021 CLINICAL DATA:  Abdominal pain, acute, nonlocalized. Metastatic colon cancer post Y 90 therapy 2 months ago. Patient reports no oral intake over the last 5 days. * Tracking Code: BO * EXAM: CT ABDOMEN AND PELVIS WITHOUT CONTRAST TECHNIQUE: Multidetector CT imaging of the abdomen and pelvis was performed following the standard protocol without IV contrast. RADIATION DOSE REDUCTION: This exam was performed according to the departmental dose-optimization program which  includes automated exposure control, adjustment of the mA and/or kV according to patient size and/or use of iterative reconstruction technique. COMPARISON:  Abdominopelvic CT 07/31/2021. FINDINGS: Lower chest: Stable right lower lobe scarring. There are several new nodular densities at the left lung base, suspicious for metastatic disease. Representative nodules include a 4 mm nodule on image 6/3 and a 4 mm nodule on image 8/3. No significant pleural or pericardial effusion. Hepatobiliary: Suboptimal visualization of the previously demonstrated multifocal hepatic metastases on this noncontrast study. Lesions in the right lobe appears slightly smaller, measuring approximately 2.7 cm at the dome on image 9/2 and 2.2 cm inferiorly on image 29/2. A lesion in the left lobe may be slightly larger, measuring approximately 2.7 cm on image 25/2 (previously 2.1 cm. No evidence of gallstones, gallbladder wall thickening or biliary dilatation. Pancreas: Unremarkable. No pancreatic ductal dilatation or surrounding inflammatory changes. Spleen: Normal in size without focal abnormality. Adrenals/Urinary Tract: Both adrenal glands appear normal. Bilateral double-J ureteral stents remain in place with interval progressive bilateral hydronephrosis and  mild perinephric soft tissue stranding. There is prominent encrustation in the urinary bladder, likely causing stent malfunction. No definite renal or ureteral calculi. Chronic right renal cortical thinning. Mild nonspecific bladder wall thickening. Stomach/Bowel: No enteric contrast was administered. The stomach appears unremarkable for its degree of distention. The small bowel, appendix and proximal colon appear normal. Progressive circumferential wall thickening throughout the rectosigmoid colon with progressive adjacent nodularity, suspicious for tumor recurrence and peritoneal carcinomatosis. There is continuity of gas between the rectal lumen and the vagina, highly suspicious for  a rectovaginal fistula. No abnormal gas seen in the bladder. Vascular/Lymphatic: No discretely enlarged abdominopelvic lymph nodes identified. No acute vascular findings on noncontrast imaging. 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 As above, gas within the vagina communicates with the rectum, consistent with an underlying rectovaginal fistula. No adnexal mass. Other: Increased diffuse perirectal soft tissue nodularity suspicious for local recurrence. Small amount of pelvic ascites. No generalized ascites or peritoneal nodularity identified within the abdomen. Musculoskeletal: No acute or significant osseous findings. IMPRESSION: 1. Progressive pelvic soft tissue nodularity and nodular mural thickening of the rectosigmoid colon consistent with progressive local malignancy (colon cancer). 2. 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 3. New moderate bilateral hydronephrosis attributed to prominent ureteral stent encrustation in the bladder. The stents are likely nonfunctional, and replacement should be considered. 4. Known hepatic metastatic disease is not optimally visualized, but does not appear significantly progressive. 5. New nodularity at the left lung base, suspicious for progressive metastatic disease. Electronically Signed   By: Richardean Sale M.D.   On: 12/16/2021 16:04   DG Chest 1 View  Result Date: 12/16/2021 CLINICAL DATA:  Abdominal pain. EXAM: CHEST  1 VIEW COMPARISON:  05/31/2020 and CT chest 04/14/2021. FINDINGS: Trachea is midline. Heart size normal. Left IJ Port-A-Cath tip may be directed posteriorly within the azygous vein. Lungs are clear. No pleural fluid. IMPRESSION: 1. No acute findings. 2. Left IJ Port-A-Cath tip may be directed posteriorly within the azygous vein. A lateral view would be helpful. Electronically Signed   By: Lorin Picket M.D.   On: 12/16/2021 13:21    ONCOLOGY HISTORY: Initial plan was to treat with FOLFOX  plus Avastin every 2 weeks.  Can consider Panitumumab as second line.  Avastin was held for the first 6 cycles secondary to persistent rectal bleeding.  Patient completed 12 cycles on Dec 12, 2020.  CT scan results from April 14, 2021 reviewed independently with mild progression of disease of 2 known lesions in her liver.  There appeared to be no other evidence of disease.    ASSESSMENT: Metastatic colorectal adenocarcinoma,  KRAS wild-type.  Now on hospice.  PLAN:   1. Metastatic colorectal adenocarcinoma: .  See oncology history as above. Although unclear whether malignancy originated in colon or rectum, given the pattern of spread to liver, patient was treated as a stage IV colon cancer.  Patient completed Y90 treatment to the liver lesions on September 01, 2021.  She was recently admitted to the hospital and was subsequently discharged on hospice.  Given her significantly decreased performance status, no further treatments are planned at this time.  We discussed the possibility of physical therapy and home health, but patient feels hospice services are what she needs at this point.  Repeat CT scan from today is pending at time of dictation.  Patient expressed understanding that if she possibly would reinitiate treatment, she would have to move back to the [place] area.  We will have a video assisted telemedicine visit in 2 weeks for further evaluation.   2.  Anemia: Hemoglobin is 8.6 today, monitor. 3.  Renal insufficiency: Improved since admission with creatinine of 1.51.  Patient has bilateral nephrostomy tubes placed.   4.  Leukocytosis: Likely reactive, monitor. 5.  Nephrostomy tubes: Bilateral, recently placed on Dec 17, 2021.   6.  Thrombocytopenia: Resolved. 7.  Peripheral neuropathy: Chronic and unchanged.   8.  Elevated LFTs: Resolved. 9.  Pain: Well controlled on current narcotic regimen as per hospice. 10.  Abdominal pain: Repeat CT scan as above is pending at time of  dictation.  Patient expressed understanding and was in agreement with this plan. She also understands that She can call clinic at any time with any questions, concerns, or complaints.    Cancer Staging  Primary malignant neoplasm of colorectal area with metastasis [facility]) Staging form: Colon and Rectum, AJCC 8th Edition - Clinical stage from 05/31/2020: Stage IVC (cTX, cNX, pM1c) - Signed by [name], MD on 05/31/2020   [name], MD   01/15/2022 9:34 AM

## 2022-01-14 ENCOUNTER — Ambulatory Visit
Admission: RE | Admit: 2022-01-14 | Discharge: 2022-01-14 | Disposition: A | Discharge status: Home / Self Care | Payer: Medicaid Other | Source: Ambulatory Visit | Attending: Oncology | Admitting: Oncology

## 2022-01-14 ENCOUNTER — Encounter: Payer: Self-pay | Admitting: Oncology

## 2022-01-14 ENCOUNTER — Encounter: Payer: Self-pay | Admitting: Urology

## 2022-01-14 ENCOUNTER — Ambulatory Visit (INDEPENDENT_AMBULATORY_CARE_PROVIDER_SITE_OTHER): Payer: Medicaid Other | Admitting: Urology

## 2022-01-14 ENCOUNTER — Inpatient Hospital Stay: Payer: Medicaid Other | Attending: Oncology

## 2022-01-14 ENCOUNTER — Inpatient Hospital Stay (HOSPITAL_BASED_OUTPATIENT_CLINIC_OR_DEPARTMENT_OTHER): Payer: Medicaid Other | Admitting: Oncology

## 2022-01-14 ENCOUNTER — Other Ambulatory Visit: Payer: Self-pay

## 2022-01-14 VITALS — BP 84/58 | HR 106 | Ht 60.0 in | Wt 113.0 lb

## 2022-01-14 DIAGNOSIS — Z87891 Personal history of nicotine dependence: Secondary | ICD-10-CM | POA: Insufficient documentation

## 2022-01-14 DIAGNOSIS — K6389 Other specified diseases of intestine: Secondary | ICD-10-CM | POA: Diagnosis not present

## 2022-01-14 DIAGNOSIS — Z833 Family history of diabetes mellitus: Secondary | ICD-10-CM | POA: Diagnosis not present

## 2022-01-14 DIAGNOSIS — Z8249 Family history of ischemic heart disease and other diseases of the circulatory system: Secondary | ICD-10-CM | POA: Insufficient documentation

## 2022-01-14 DIAGNOSIS — C19 Malignant neoplasm of rectosigmoid junction: Secondary | ICD-10-CM | POA: Insufficient documentation

## 2022-01-14 DIAGNOSIS — R531 Weakness: Secondary | ICD-10-CM | POA: Insufficient documentation

## 2022-01-14 DIAGNOSIS — N1330 Unspecified hydronephrosis: Secondary | ICD-10-CM | POA: Diagnosis not present

## 2022-01-14 DIAGNOSIS — C799 Secondary malignant neoplasm of unspecified site: Secondary | ICD-10-CM | POA: Diagnosis not present

## 2022-01-14 DIAGNOSIS — G629 Polyneuropathy, unspecified: Secondary | ICD-10-CM | POA: Diagnosis not present

## 2022-01-14 DIAGNOSIS — Z800 Family history of malignant neoplasm of digestive organs: Secondary | ICD-10-CM | POA: Insufficient documentation

## 2022-01-14 DIAGNOSIS — D259 Leiomyoma of uterus, unspecified: Secondary | ICD-10-CM | POA: Insufficient documentation

## 2022-01-14 DIAGNOSIS — D649 Anemia, unspecified: Secondary | ICD-10-CM | POA: Insufficient documentation

## 2022-01-14 DIAGNOSIS — R5383 Other fatigue: Secondary | ICD-10-CM | POA: Insufficient documentation

## 2022-01-14 DIAGNOSIS — R918 Other nonspecific abnormal finding of lung field: Secondary | ICD-10-CM | POA: Diagnosis not present

## 2022-01-14 DIAGNOSIS — K769 Liver disease, unspecified: Secondary | ICD-10-CM | POA: Diagnosis not present

## 2022-01-14 DIAGNOSIS — N1831 Chronic kidney disease, stage 3a: Secondary | ICD-10-CM | POA: Diagnosis not present

## 2022-01-14 DIAGNOSIS — R188 Other ascites: Secondary | ICD-10-CM | POA: Insufficient documentation

## 2022-01-14 DIAGNOSIS — Z809 Family history of malignant neoplasm, unspecified: Secondary | ICD-10-CM | POA: Diagnosis not present

## 2022-01-14 DIAGNOSIS — C787 Secondary malignant neoplasm of liver and intrahepatic bile duct: Secondary | ICD-10-CM | POA: Insufficient documentation

## 2022-01-14 DIAGNOSIS — Z79899 Other long term (current) drug therapy: Secondary | ICD-10-CM | POA: Insufficient documentation

## 2022-01-14 DIAGNOSIS — J984 Other disorders of lung: Secondary | ICD-10-CM | POA: Insufficient documentation

## 2022-01-14 DIAGNOSIS — R109 Unspecified abdominal pain: Secondary | ICD-10-CM | POA: Insufficient documentation

## 2022-01-14 DIAGNOSIS — N179 Acute kidney failure, unspecified: Secondary | ICD-10-CM | POA: Diagnosis not present

## 2022-01-14 DIAGNOSIS — N2889 Other specified disorders of kidney and ureter: Secondary | ICD-10-CM | POA: Diagnosis not present

## 2022-01-14 DIAGNOSIS — D72829 Elevated white blood cell count, unspecified: Secondary | ICD-10-CM | POA: Insufficient documentation

## 2022-01-14 DIAGNOSIS — Z823 Family history of stroke: Secondary | ICD-10-CM | POA: Insufficient documentation

## 2022-01-14 DIAGNOSIS — Z8049 Family history of malignant neoplasm of other genital organs: Secondary | ICD-10-CM | POA: Insufficient documentation

## 2022-01-14 DIAGNOSIS — C189 Malignant neoplasm of colon, unspecified: Secondary | ICD-10-CM | POA: Diagnosis not present

## 2022-01-14 DIAGNOSIS — N135 Crossing vessel and stricture of ureter without hydronephrosis: Secondary | ICD-10-CM | POA: Diagnosis not present

## 2022-01-14 LAB — COMPREHENSIVE METABOLIC PANEL
ALT: 12 U/L (ref 0–44)
AST: 24 U/L (ref 15–41)
Albumin: 1.9 g/dL — ABNORMAL LOW (ref 3.5–5.0)
Alkaline Phosphatase: 192 U/L — ABNORMAL HIGH (ref 38–126)
Anion gap: 7 (ref 5–15)
BUN: 23 mg/dL — ABNORMAL HIGH (ref 6–20)
CO2: 29 mmol/L (ref 22–32)
Calcium: 7.9 mg/dL — ABNORMAL LOW (ref 8.9–10.3)
Chloride: 97 mmol/L — ABNORMAL LOW (ref 98–111)
Creatinine, Ser: 1.51 mg/dL — ABNORMAL HIGH (ref 0.44–1.00)
GFR, Estimated: 41 mL/min — ABNORMAL LOW (ref >60)
Glucose, Bld: 103 mg/dL — ABNORMAL HIGH (ref 70–99)
Potassium: 4.9 mmol/L (ref 3.5–5.1)
Sodium: 133 mmol/L — ABNORMAL LOW (ref 135–145)
Total Bilirubin: 0.6 mg/dL (ref 0.3–1.2)
Total Protein: 7.2 g/dL (ref 6.5–8.1)

## 2022-01-14 LAB — CBC WITH DIFFERENTIAL/PLATELET
Abs Immature Granulocytes: 0.16 10*3/uL — ABNORMAL HIGH (ref 0.00–0.07)
Basophils Absolute: 0 10*3/uL (ref 0.0–0.1)
Basophils Relative: 0 %
Eosinophils Absolute: 0.1 10*3/uL (ref 0.0–0.5)
Eosinophils Relative: 0 %
HCT: 28.4 % — ABNORMAL LOW (ref 36.0–46.0)
Hemoglobin: 8.6 g/dL — ABNORMAL LOW (ref 12.0–15.0)
Immature Granulocytes: 1 %
Lymphocytes Relative: 11 %
Lymphs Abs: 2 10*3/uL (ref 0.7–4.0)
MCH: 32.1 pg (ref 26.0–34.0)
MCHC: 30.3 g/dL (ref 30.0–36.0)
MCV: 106 fL — ABNORMAL HIGH (ref 80.0–100.0)
Monocytes Absolute: 1.1 10*3/uL — ABNORMAL HIGH (ref 0.1–1.0)
Monocytes Relative: 6 %
Neutro Abs: 14.1 10*3/uL — ABNORMAL HIGH (ref 1.7–7.7)
Neutrophils Relative %: 82 %
Platelets: 368 10*3/uL (ref 150–400)
RBC: 2.68 MIL/uL — ABNORMAL LOW (ref 3.87–5.11)
RDW: 17 % — ABNORMAL HIGH (ref 11.5–15.5)
WBC: 17.3 10*3/uL — ABNORMAL HIGH (ref 4.0–10.5)
nRBC: 0 % (ref 0.0–0.2)

## 2022-01-14 MED ORDER — IOHEXOL 300 MG/ML  SOLN
75.0000 mL | Freq: Once | INTRAMUSCULAR | Status: AC | PRN
  Administered 2022-01-14: 75 mL via INTRAVENOUS

## 2022-01-14 NOTE — Progress Notes (Signed)
01/14/22 2:35 PM   [place] [birth year] [id number]  Referring provider:  [name], NP [address] [place],  [place] [id number] No chief complaint on file.   HPI: [place] is a 56 y.o.female who presents today for further discussion today of management of nephrostomy tubes and current indwelling stents.  She has a personal history of obstructive uropathy and hydronephrosis.  She initially presented in renal failure from malignant obstruction and had bilateral nephrostomy tubes exchanged.  These ended up being internalized in December 2022 by interventional radiology.  She is not under the care of urology and had not had these tubes evaluated or exchanged.  She was admitted in the hospital on 12/16/2021. She presented wit abdominal pain and weakness. She was found to have significantly worsening renal function with creatinine up from 1.43 to 7.49.Marland Kitchen CT scan of abdomen/pelvis showed progressive colon cancer and new moderate bilateral hydronephrosis attributed to prominent ureteral stent encrustation in the bladder. She was admitted into [facility] cefepime was started. She grew E. Coli.  Ultimately, she underwent undergoing bilateral percutaneous nephrostomy tubes and a creatinine began to trend down.  There is purulent urine noted bilaterally.  She was discharged on comfort care to hospice.  She is doing fine with nephrostomy tubes she since moved to [place] to live with her ex husband who is her caretaker. Traveling back and forth is burdensome but she odes not want to change urological care. Her right nephrostomy tube dressing is bothering her and she is requesting change with new tubing.  She is seeing Dr. Grayland [place] for labs and visit later this morning.   PMH: Past Medical History:  Diagnosis Date   Cancer associated pain    Colon cancer ([place])    Family history of pancreatic cancer    Family history of uterine cancer     Surgical History: Past Surgical  History:  Procedure Laterality Date   COLONOSCOPY WITH PROPOFOL N/A 05/27/2020   Procedure: COLONOSCOPY WITH PROPOFOL;  Surgeon: [place]ita Landsman, MD;  Location: [facility] ENDOSCOPY;  Service: Gastroenterology;  Laterality: N/A;   FLEXIBLE SIGMOIDOSCOPY N/A 05/28/2020   Procedure: FLEXIBLE SIGMOIDOSCOPY;  Surgeon: [place]ita Landsman, MD;  Location: [facility] ENDOSCOPY;  Service: Gastroenterology;  Laterality: N/A;   IR 3D INDEPENDENT WKST  06/09/2021   IR ANGIOGRAM SELECTIVE EACH ADDITIONAL VESSEL  06/09/2021   IR ANGIOGRAM SELECTIVE EACH ADDITIONAL VESSEL  06/09/2021   IR ANGIOGRAM SELECTIVE EACH ADDITIONAL VESSEL  06/09/2021   IR ANGIOGRAM SELECTIVE EACH ADDITIONAL VESSEL  08/18/2021   IR ANGIOGRAM SELECTIVE EACH ADDITIONAL VESSEL  08/18/2021   IR ANGIOGRAM SELECTIVE EACH ADDITIONAL VESSEL  08/18/2021   IR ANGIOGRAM SELECTIVE EACH ADDITIONAL VESSEL  10/20/2021   IR ANGIOGRAM SELECTIVE EACH ADDITIONAL VESSEL  10/20/2021   IR ANGIOGRAM VISCERAL SELECTIVE  06/09/2021   IR ANGIOGRAM VISCERAL SELECTIVE  08/18/2021   IR ANGIOGRAM VISCERAL SELECTIVE  09/01/2021   IR ANGIOGRAM VISCERAL SELECTIVE  10/20/2021   IR EMBO TUMOR ORGAN ISCHEMIA INFARCT INC GUIDE ROADMAPPING  09/01/2021   IR EMBO TUMOR ORGAN ISCHEMIA INFARCT INC GUIDE ROADMAPPING  10/20/2021   IR NEPHROSTOMY EXCHANGE LEFT  07/11/2020   IR NEPHROSTOMY EXCHANGE RIGHT  07/11/2020   IR NEPHROSTOMY PLACEMENT LEFT  05/23/2020   IR NEPHROSTOMY PLACEMENT LEFT  12/17/2021   IR NEPHROSTOMY PLACEMENT RIGHT  05/23/2020   IR NEPHROSTOMY PLACEMENT RIGHT  12/17/2021   IR RADIOLOGIST EVAL & MGMT  04/28/2021   IR URETERAL STENT LEFT NEW ACCESS W/O SEP NEPHROSTOMY  CATH  08/15/2020   IR URETERAL STENT RIGHT NEW ACCESS W/O SEP NEPHROSTOMY CATH  08/15/2020   IR US GUIDE VASC ACCESS RIGHT  06/09/2021   IR US GUIDE VASC ACCESS RIGHT  08/18/2021   IR US GUIDE VASC ACCESS RIGHT  09/01/2021   IR US GUIDE VASC ACCESS RIGHT  10/20/2021   PORTACATH PLACEMENT Left 05/29/2020   Procedure:  INSERTION PORT-A-CATH;  Surgeon: [place] Ree, MD;  Location: [facility] ORS;  Service: General;  Laterality: Left;    Home Medications:  Allergies as of 01/14/2022   No Known Allergies      Medication List        Accurate as of January 14, 2022  2:35 PM. If you have any questions, ask your nurse or doctor.          acetaminophen 325 MG tablet Commonly known as: TYLENOL Take 650 mg by mouth every 6 (six) hours as needed.   IMODIUM PO Take by mouth.   lidocaine-prilocaine cream Commonly known as: EMLA Apply 1 application topically as needed. Apply prior to appointment. Cover with plastic wrap.   loratadine 10 MG tablet Commonly known as: CLARITIN Take 10 mg by mouth daily.   ondansetron 4 MG tablet Commonly known as: ZOFRAN Take 1 tablet (4 mg total) by mouth every 8 (eight) hours as needed for nausea or vomiting.   pantoprazole 40 MG tablet Commonly known as: PROTONIX Take 1 tablet (40 mg total) by mouth 2 (two) times daily.        Allergies:  No Known Allergies  Family History: Family History  Problem Relation Age of Onset   Other Mother        Corticobasal degeneration   Uterine cancer Mother    Diabetes Maternal Grandmother    Stroke Maternal Grandfather    Stroke Paternal Grandfather    Heart disease Father    Pancreatic cancer Maternal Uncle 38   Cancer Paternal Aunt        unk type   Liver cancer Maternal Uncle    Pancreatic cancer Cousin    Cancer Cousin        unk type    Social History:  reports that she has quit smoking. Her smoking use included cigarettes. She has never used smokeless tobacco. She reports that she does not drink alcohol and does not use drugs.   Physical Exam: BP (!) 84/58   Pulse (!) 106   Ht 5' (1.524 m)   Wt 113 lb (51.3 kg)   BMI 22.07 kg/m   Constitutional:  Alert and oriented, No acute distress.  Appearing, in wheelchair.  Accompanied by ex-husband. HEENT: [place] AT, moist mucus membranes.  Trachea midline, no  masses. Cardiovascular: No clubbing, cyanosis, or edema. Respiratory: Normal respiratory effort, no increased work of breathing. Skin: No rashes, bruises or suspicious lesions. Neurologic: Grossly intact, no focal deficits, moving all 4 extremities. Psychiatric: Normal mood and affect.  Laboratory Data: Lab Results  Component Value Date   CREATININE 1.51 (H) 01/14/2022      Assessment & Plan:    Ureteral obstruction and retained ureteral stents  - Discussed various options moving forward which include attempt at removing encrusted stent and replacing with new stent with possible clamp trial versus maintaining current nephrostomy tube allowing retained stents to stay with serial nephrostomy tube exchanges at the 8 to 12-week interval.   -Cr 1.5 which is reassuring, UOP good - We discussed the risk of both. Her preference is to [facility] nephrostomy tube.  She will need exhange which we will assist with. Her dressing and tube was changed today  -We also facilitated moving her CT scan up to today so that she will not have to travel back again next week  We will schedule serial nephrostomy tube exchanges, next 1 due in about 6 to 8 weeks  Conley Rolls as a scribe for Hollice Espy, MD.,have documented all relevant documentation on the behalf of Hollice Espy, MD,as directed by  Hollice Espy, MD while in the presence of Hollice Espy, MD.  I have reviewed the above documentation for accuracy and completeness, and I agree with the above.   Hollice Espy, MD   [facility] [address], [place], [place] [address] [phone]  I spent 42 total minutes on the day of the encounter including pre-visit review of the medical record, face-to-face time with the patient, and post visit ordering of labs/imaging/tests.  Additional time was spent in communication with Dr. Grayland [place], the cancer center, and coordinating care in order to arrange for  stat CT scan today

## 2022-01-14 NOTE — Progress Notes (Signed)
Patient here today for follow up regarding colon cancer. Patient reports ongoing nausea and diarrhea. Patient rates pain 3/10 today. Patient is currently being followed by hospice in [place].

## 2022-01-15 LAB — CEA: CEA: 18.4 ng/mL — ABNORMAL HIGH (ref 0.0–4.7)

## 2022-01-19 ENCOUNTER — Other Ambulatory Visit: Payer: Medicaid Other

## 2022-01-20 ENCOUNTER — Telehealth: Payer: Self-pay

## 2022-01-20 DIAGNOSIS — N179 Acute kidney failure, unspecified: Secondary | ICD-10-CM

## 2022-01-20 NOTE — Telephone Encounter (Signed)
6-8  wk bilateral neph tube change per dr Erlene Quan

## 2022-01-22 NOTE — Progress Notes (Deleted)
[place]  [phone] [phone]  ID: [place] OB: 06/28/1966  MR#: [id number]  ZYY#:[id number]  Patient Care Team: Pcp, No as PCP - General [name], MD as Consulting Physician (Oncology) [name], RN as Oncology Nurse Navigator  I connected with [place] on 01/22/22 at  2:45 PM EDT by {Blank single:19197::"video enabled telemedicine visit","telephone visit"} and verified that I am speaking with the correct person using two identifiers.   I discussed the limitations, risks, security and privacy concerns of performing an evaluation and management service by telemedicine and the availability of in-person appointments. I also discussed with the patient that there may be a patient responsible charge related to this service. The patient expressed understanding and agreed to proceed.   Other persons participating in the visit and their role in the encounter: Patient, MD.  Patient's location: Home. Provider's location: Clinic.  CHIEF COMPLAINT: Progressive metastatic colorectal adenocarcinoma, KRAS wild-type.  Now on hospice.  INTERVAL HISTORY: Patient returns to clinic today for repeat laboratory work and further evaluation.  She is currently enrolled in hospice and has moved to [place] to live with her ex-husband and his wife.  She continues to have weakness and fatigue and a decreased performance status.  Her pain is well controlled on her current narcotics.  She has a fair appetite, but reports weight gain.  She continues to have a mild peripheral neuropathy, but no other neurologic complaints.  She denies any chest pain, shortness of breath, cough, or hemoptysis.  She denies any nausea, vomiting, constipation, or diarrhea.  She has no urinary complaints.  Patient offers no further specific complaints today.  REVIEW OF SYSTEMS:   Review of Systems  Constitutional:  Positive for malaise/fatigue. Negative for  fever and weight loss.  HENT: Negative.  Negative for congestion.   Respiratory: Negative.  Negative for cough, hemoptysis and shortness of breath.   Cardiovascular: Negative.  Negative for chest pain and leg swelling.  Gastrointestinal:  Positive for abdominal pain. Negative for blood in stool, constipation, nausea and vomiting.  Genitourinary: Negative.  Negative for flank pain and hematuria.  Musculoskeletal: Negative.  Negative for back pain.  Skin: Negative.  Negative for rash.  Neurological:  Positive for tingling, sensory change and weakness. Negative for dizziness, seizures and headaches.  Psychiatric/Behavioral:  The patient is not nervous/anxious.     As per HPI. Otherwise, a complete review of systems is negative.  PAST MEDICAL HISTORY: Past Medical History:  Diagnosis Date   Cancer associated pain    Colon cancer ([place])    Family history of pancreatic cancer    Family history of uterine cancer     PAST SURGICAL HISTORY: Past Surgical History:  Procedure Laterality Date   COLONOSCOPY WITH PROPOFOL N/A 05/27/2020   Procedure: COLONOSCOPY WITH PROPOFOL;  Surgeon: [name], MD;  Location: [facility] ENDOSCOPY;  Service: Gastroenterology;  Laterality: N/A;   FLEXIBLE SIGMOIDOSCOPY N/A 05/28/2020   Procedure: FLEXIBLE SIGMOIDOSCOPY;  Surgeon: [name], MD;  Location: [facility] ENDOSCOPY;  Service: Gastroenterology;  Laterality: N/A;   IR 3D INDEPENDENT WKST  06/09/2021   IR ANGIOGRAM SELECTIVE EACH ADDITIONAL VESSEL  06/09/2021   IR ANGIOGRAM SELECTIVE EACH ADDITIONAL VESSEL  06/09/2021   IR ANGIOGRAM SELECTIVE EACH ADDITIONAL VESSEL  06/09/2021   IR ANGIOGRAM SELECTIVE EACH ADDITIONAL VESSEL  08/18/2021   IR ANGIOGRAM SELECTIVE EACH ADDITIONAL VESSEL  08/18/2021   IR ANGIOGRAM SELECTIVE EACH ADDITIONAL VESSEL  08/18/2021   IR ANGIOGRAM SELECTIVE  EACH ADDITIONAL VESSEL  10/20/2021   IR ANGIOGRAM SELECTIVE EACH ADDITIONAL VESSEL  10/20/2021   IR ANGIOGRAM VISCERAL  SELECTIVE  06/09/2021   IR ANGIOGRAM VISCERAL SELECTIVE  08/18/2021   IR ANGIOGRAM VISCERAL SELECTIVE  09/01/2021   IR ANGIOGRAM VISCERAL SELECTIVE  10/20/2021   IR EMBO TUMOR ORGAN ISCHEMIA INFARCT INC GUIDE ROADMAPPING  09/01/2021   IR EMBO TUMOR ORGAN ISCHEMIA INFARCT INC GUIDE ROADMAPPING  10/20/2021   IR NEPHROSTOMY EXCHANGE LEFT  07/11/2020   IR NEPHROSTOMY EXCHANGE RIGHT  07/11/2020   IR NEPHROSTOMY PLACEMENT LEFT  05/23/2020   IR NEPHROSTOMY PLACEMENT LEFT  12/17/2021   IR NEPHROSTOMY PLACEMENT RIGHT  05/23/2020   IR NEPHROSTOMY PLACEMENT RIGHT  12/17/2021   IR RADIOLOGIST EVAL & MGMT  04/28/2021   IR URETERAL STENT LEFT NEW ACCESS W/O SEP NEPHROSTOMY CATH  08/15/2020   IR URETERAL STENT RIGHT NEW ACCESS W/O SEP NEPHROSTOMY CATH  08/15/2020   IR US GUIDE VASC ACCESS RIGHT  06/09/2021   IR US GUIDE VASC ACCESS RIGHT  08/18/2021   IR US GUIDE VASC ACCESS RIGHT  09/01/2021   IR US GUIDE VASC ACCESS RIGHT  10/20/2021   PORTACATH PLACEMENT Left 05/29/2020   Procedure: INSERTION PORT-A-CATH;  Surgeon: [name], MD;  Location: [facility] ORS;  Service: General;  Laterality: Left;    FAMILY HISTORY: Family History  Problem Relation Age of Onset   Other Mother        Corticobasal degeneration   Uterine cancer Mother    Diabetes Maternal Grandmother    Stroke Maternal Grandfather    Stroke Paternal Grandfather    Heart disease Father    Pancreatic cancer Maternal Uncle 37   Cancer Paternal Aunt        unk type   Liver cancer Maternal Uncle    Pancreatic cancer Cousin    Cancer Cousin        unk type    ADVANCED DIRECTIVES (Y/N):  N  HEALTH MAINTENANCE: Social History   Tobacco Use   Smoking status: Former    Types: Cigarettes   Smokeless tobacco: Never  Vaping Use   Vaping Use: Never used  Substance Use Topics   Alcohol use: Never   Drug use: Never     Colonoscopy:  PAP:  Bone density:  Lipid panel:  No Known Allergies  Current Outpatient Medications  Medication Sig  Dispense Refill   acetaminophen (TYLENOL) 325 MG tablet Take 650 mg by mouth every 6 (six) hours as needed.     lidocaine-prilocaine (EMLA) cream Apply 1 application topically as needed. Apply prior to appointment. Cover with plastic wrap. 30 g 2   Loperamide HCl (IMODIUM PO) Take by mouth.     loratadine (CLARITIN) 10 MG tablet Take 10 mg by mouth daily.     ondansetron (ZOFRAN) 4 MG tablet Take 1 tablet (4 mg total) by mouth every 8 (eight) hours as needed for nausea or vomiting. 60 tablet 2   pantoprazole (PROTONIX) 40 MG tablet Take 1 tablet (40 mg total) by mouth 2 (two) times daily. 60 tablet 0   No current facility-administered medications for this visit.    OBJECTIVE: There were no vitals filed for this visit.    There is no height or weight on file to calculate BMI.    ECOG FS:3 - Symptomatic, >50% confined to bed  General: Ill-appearing, no acute distress.  Sitting in a wheelchair. Eyes: Pink conjunctiva, anicteric sclera. HEENT: Normocephalic, moist mucous membranes. Lungs: No audible wheezing or  coughing. Heart: Regular rate and rhythm. Abdomen: Soft, nontender, no obvious distention. Musculoskeletal: No edema, cyanosis, or clubbing. Neuro: Alert, answering all questions appropriately. Cranial nerves grossly intact. Skin: No rashes or petechiae noted. Psych: Normal affect.  LAB RESULTS:  Lab Results  Component Value Date   NA 133 (L) 01/14/2022   K 4.9 01/14/2022   CL 97 (L) 01/14/2022   CO2 29 01/14/2022   GLUCOSE 103 (H) 01/14/2022   BUN 23 (H) 01/14/2022   CREATININE 1.51 (H) 01/14/2022   CALCIUM 7.9 (L) 01/14/2022   PROT 7.2 01/14/2022   ALBUMIN 1.9 (L) 01/14/2022   AST 24 01/14/2022   ALT 12 01/14/2022   ALKPHOS 192 (H) 01/14/2022   BILITOT 0.6 01/14/2022   GFRNONAA 41 (L) 01/14/2022    Lab Results  Component Value Date   WBC 17.3 (H) 01/14/2022   NEUTROABS 14.1 (H) 01/14/2022   HGB 8.6 (L) 01/14/2022   HCT 28.4 (L) 01/14/2022   MCV 106.0 (H)  01/14/2022   PLT 368 01/14/2022     STUDIES: CT ABDOMEN PELVIS W CONTRAST  Result Date: 01/16/2022 CLINICAL DATA:  Restaging metastatic colon cancer. History of transarterial radioembolization with Y 90 spheres. EXAM: CT ABDOMEN AND PELVIS WITH CONTRAST TECHNIQUE: Multidetector CT imaging of the abdomen and pelvis was performed using the standard protocol following bolus administration of intravenous contrast. RADIATION DOSE REDUCTION: This exam was performed according to the departmental dose-optimization program which includes automated exposure control, adjustment of the mA and/or kV according to patient size and/or use of iterative reconstruction technique. CONTRAST:  19m OMNIPAQUE IOHEXOL 300 MG/ML  SOLN COMPARISON:  Noncontrast enhanced CT scan 12/16/2021 and prior CT scan 07/31/2021 FINDINGS: Lower chest: The heart is normal in size. No pericardial effusion. Small scattered bilateral pulmonary nodules unchanged since the recent CT scan. Very small left pleural effusion. Hepatobiliary: The segment 8/7 lesion is very ill-defined and difficult to measure accurately but is definitely much smaller when compared to the prior study. It measured a maximum of 3.5 cm on the prior study and now measures approximately 1.6 cm. Smaller very vague nodule centrally in segment 8 on image number 15/7 measures 10 mm. The lesion in the segment 6 area previously measured 5.3 x 4.2 cm and now measures 3.0 x 2.5 cm. Lesion in the base of the caudate lobe on image 36/7 measures approximately 16 x 14 mm and previously measured 26 x 25 mm. 15 mm lesion in the anterior aspect of segment 2 is difficult to see on the prior study. The segment 3 lesion measures 2.4 x 2.0 cm on image 28/7. This previously measured 2.7 x 2.2 cm. 16 mm lower segment 3 lesion on image 32/7 is difficult to see on the prior study. Another smaller anterior segment 3 lesion measures 10 mm on image number 31/7. No biliary dilatation. Gallbladder is grossly  normal. No common bile duct dilatation. Pancreas: No mass, inflammation or ductal dilatation. Spleen: Stable borderline splenic enlargement. No splenic lesions. Adrenals/Urinary Tract: Adrenal glands are normal in stable. Bilateral nephrostomy tubes and ureteral stents. No renal lesions. Small right kidney and noted. In the bladder the double-J ureteral stents are encrusted with calcifications. Stomach/Bowel: The stomach, duodenum and small are unremarkable. No obstructive findings. Persistent large ulcerated rectosigmoid mass invading the posterior wall the bladder, the vagina and uterus. Vascular/Lymphatic: The major vascular structures are patent. Stable scattered mesenteric and retroperitoneal lymph nodes. Stable scattered pelvic lymph nodes. Reproductive: The vagina and uterus are likely involved with tumor. Other:  No ascites or abdominal hernia. Soft tissue nodularity around the rectosigmoid mass is likely direct mesorectal and sigmoid mesocolon extension of tumor. Musculoskeletal: No significant bony findings. IMPRESSION: 1. Persistent large ulcerated rectosigmoid mass likely invading the bladder, the vagina and uterus and also the mesorectum and sigmoid mesocolon. 2. Definite improved appearance of the right hepatic lobe lesions. Findings worrisome for new left hepatic lobe lesions. MRI abdomen without and with contrast may be helpful for further evaluation if clinically necessary. 3. Bilateral nephrostomy tubes and ureteral stents in good position without complicating features. Extensive encrusted calcifications around the double-J ureteral stents in the bladder. 4. Stable scattered mesenteric, retroperitoneal and pelvic lymph nodes. 5. Stable bilateral pulmonary nodules. 6. Very small left pleural effusion. * Tracking Code: BO * Electronically Signed   By: Marijo Sanes M.D.   On: 01/16/2022 10:49    ONCOLOGY HISTORY: Initial plan was to treat with FOLFOX plus Avastin every 2 weeks.  Can consider  Panitumumab as second line.  Avastin was held for the first 6 cycles secondary to persistent rectal bleeding.  Patient completed 12 cycles on Dec 12, 2020.  CT scan results from April 14, 2021 reviewed independently with mild progression of disease of 2 known lesions in her liver.  There appeared to be no other evidence of disease.    ASSESSMENT: Metastatic colorectal adenocarcinoma,  KRAS wild-type.  Now on hospice.  PLAN:   1. Metastatic colorectal adenocarcinoma: .  See oncology history as above. Although unclear whether malignancy originated in colon or rectum, given the pattern of spread to liver, patient was treated as a stage IV colon cancer.  Patient completed Y90 treatment to the liver lesions on September 01, 2021.  She was recently admitted to the hospital and was subsequently discharged on hospice.  Given her significantly decreased performance status, no further treatments are planned at this time.  We discussed the possibility of physical therapy and home health, but patient feels hospice services are what she needs at this point.  Repeat CT scan from today is pending at time of dictation.  Patient expressed understanding that if she possibly would reinitiate treatment, she would have to move back to the [place] area.  We will have a video assisted telemedicine visit in 2 weeks for further evaluation.   2.  Anemia: Hemoglobin is 8.6 today, monitor. 3.  Renal insufficiency: Improved since admission with creatinine of 1.51.  Patient has bilateral nephrostomy tubes placed.   4.  Leukocytosis: Likely reactive, monitor. 5.  Nephrostomy tubes: Bilateral, recently placed on Dec 17, 2021.   6.  Thrombocytopenia: Resolved. 7.  Peripheral neuropathy: Chronic and unchanged.   8.  Elevated LFTs: Resolved. 9.  Pain: Well controlled on current narcotic regimen as per hospice. 10.  Abdominal pain: Repeat CT scan as above is pending at time of dictation.  I provided *** minutes of {Blank  single:19197::"face-to-face video visit time","non face-to-face telephone visit time"} during this encounter which included chart review, counseling, and coordination of care as documented above.   Patient expressed understanding and was in agreement with this plan. She also understands that She can call clinic at any time with any questions, concerns, or complaints.    Cancer Staging  Primary malignant neoplasm of colorectal area with metastasis [facility]) Staging form: Colon and Rectum, AJCC 8th Edition - Clinical stage from 05/31/2020: Stage IVC (cTX, cNX, pM1c) - Signed by [name], MD on 05/31/2020   [name], MD   01/22/2022 1:46 PM

## 2022-01-26 ENCOUNTER — Telehealth: Payer: Medicaid Other

## 2022-01-28 ENCOUNTER — Inpatient Hospital Stay: Payer: Medicaid Other | Admitting: Oncology

## 2022-01-28 ENCOUNTER — Telehealth: Payer: Self-pay

## 2022-01-28 DIAGNOSIS — C19 Malignant neoplasm of rectosigmoid junction: Secondary | ICD-10-CM

## 2022-01-28 NOTE — Telephone Encounter (Signed)
Called patient to update information regarding video video, no answer. Voicemail left.

## 2022-01-29 IMAGING — CT CT ABD-PELV W/O CM
2 of 4 series · 14 of 46 positions shown, 16 images · non-contrast
Comparison: None.

CLINICAL DATA: Intermittent rectal bleeding.

EXAM:
CT ABDOMEN AND PELVIS WITHOUT CONTRAST
TECHNIQUE: Multidetector CT imaging of the abdomen and pelvis was performed
following the standard protocol without IV contrast.

[Series 2: routine abd/pel wo · axial · 0.72mm/px · z∈[-1103,-663]mm · 11 of 102 slices shown, 13 images]
[im 9/102  soft-tissue]
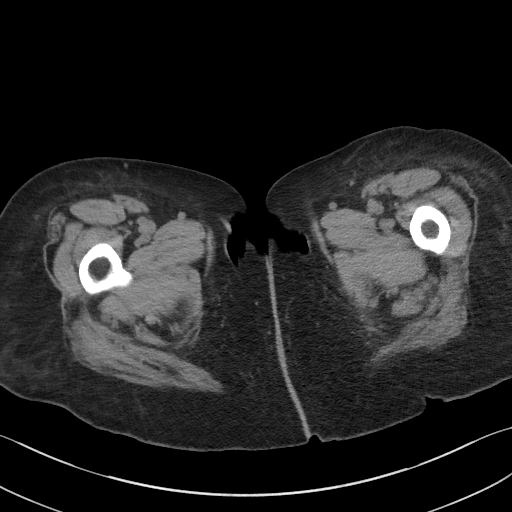
[im 9/102  bone]
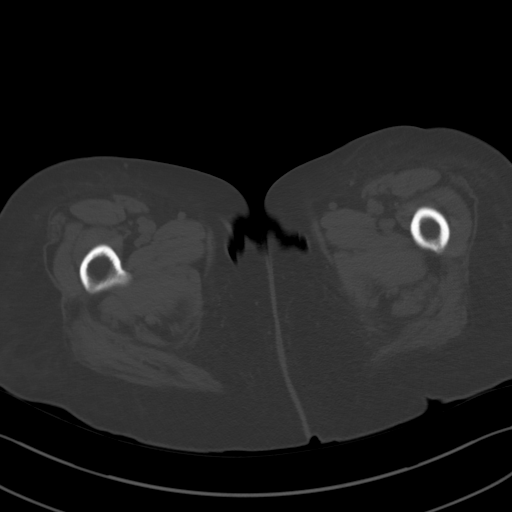
[im 18/102  soft-tissue]
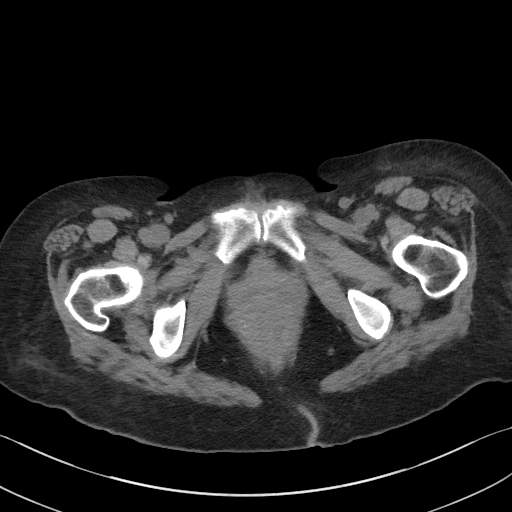
[im 27/102  soft-tissue]
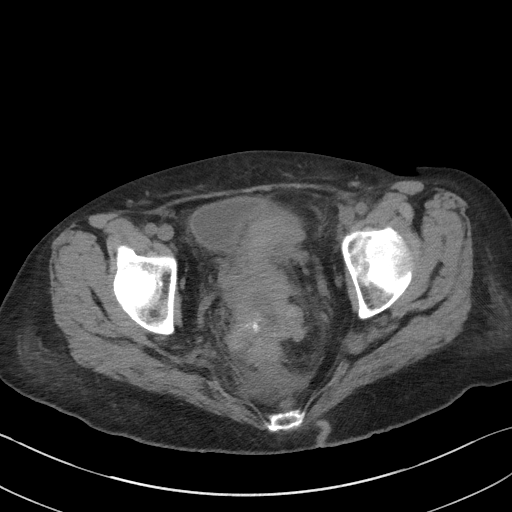
[im 36/102  soft-tissue]
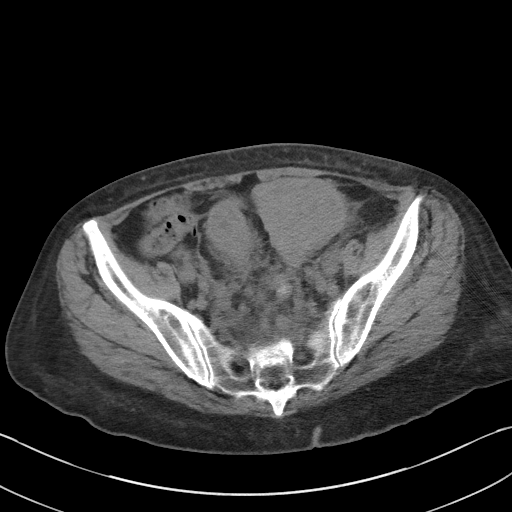
[im 44/102  soft-tissue]
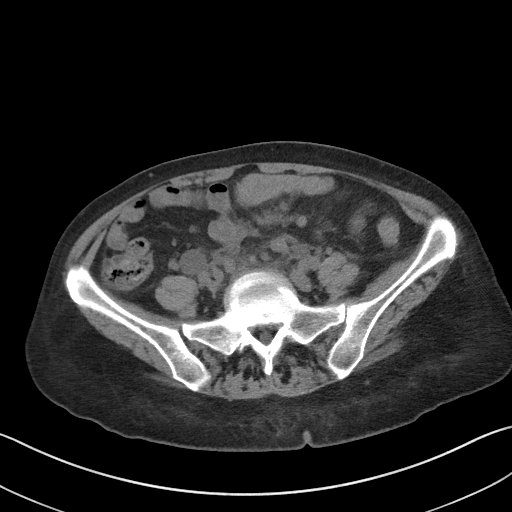
[im 53/102  soft-tissue]
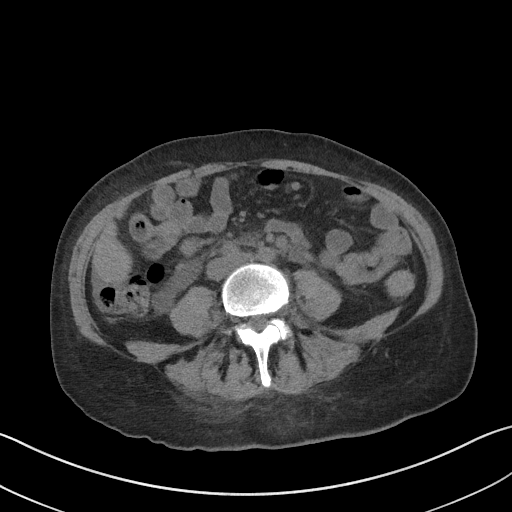
[im 62/102  soft-tissue]
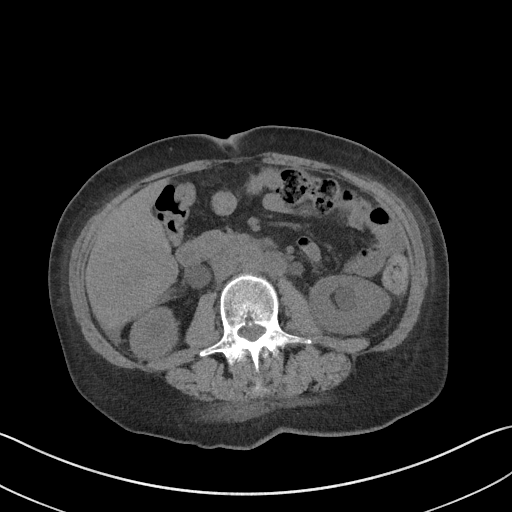
[im 71/102  soft-tissue]
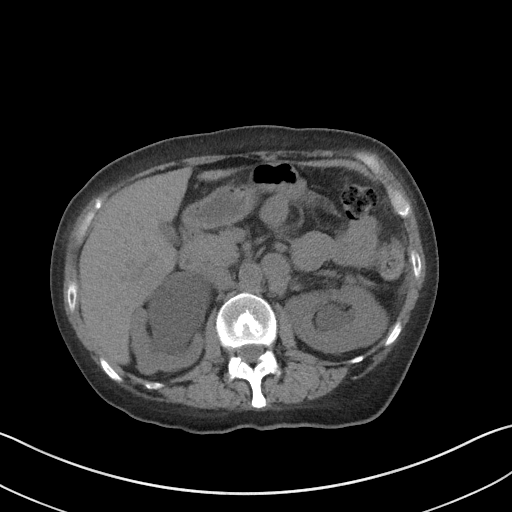
[im 80/102  soft-tissue]
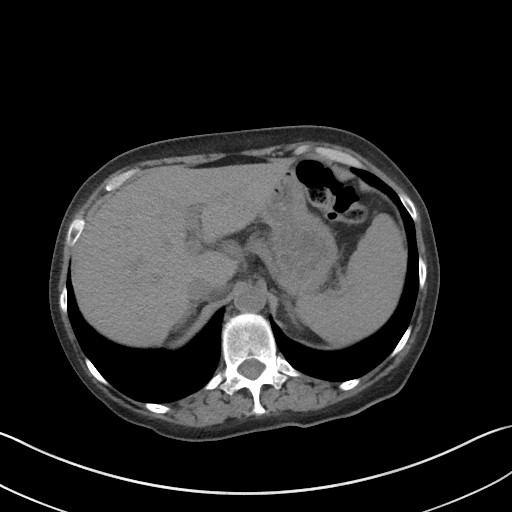
[im 80/102  bone]
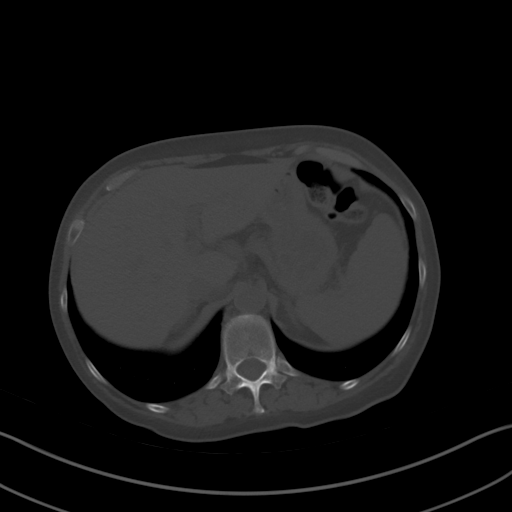
[im 88/102  soft-tissue]
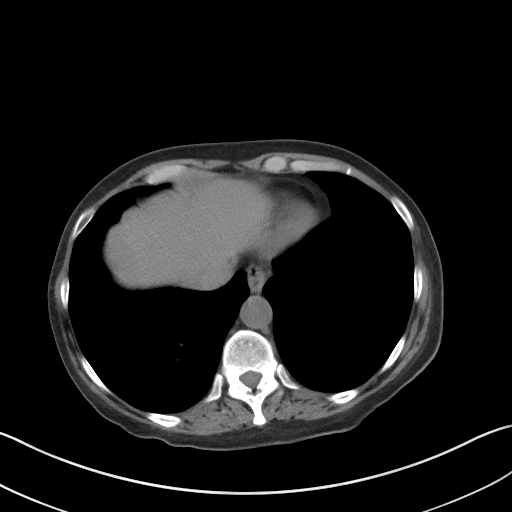
[im 97/102  soft-tissue]
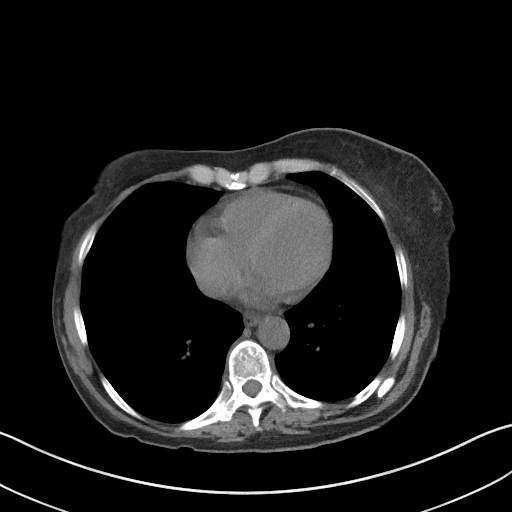

[Series 5: coronal st · coronal · 0.76mm/px · 3 of 80 slices shown]
[im 27/80  soft-tissue]
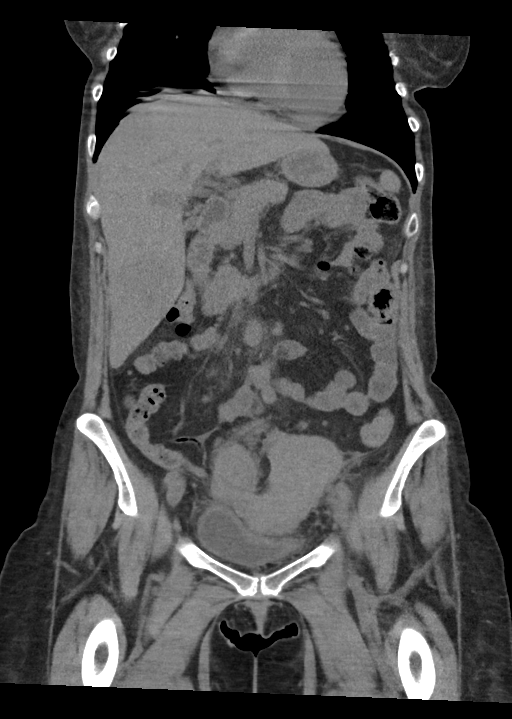
[im 36/80  soft-tissue]
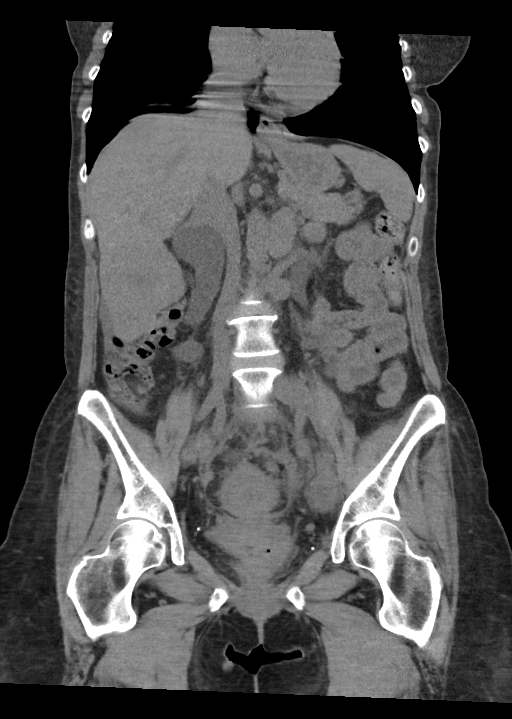
[im 44/80  soft-tissue]
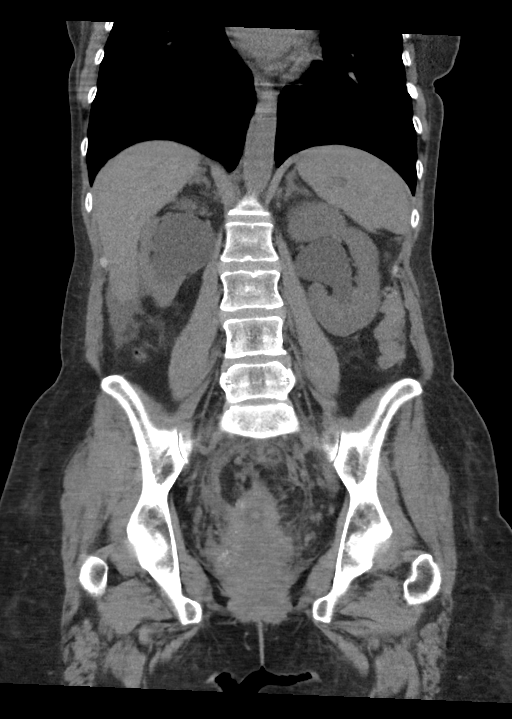

[14 of 46 positions shown; findings below may reference images not displayed]

FINDINGS: Lower chest: No acute abnormality.

Hepatobiliary: A 2.9 cm x 3.1 cm ill-defined area of heterogeneous
low attenuation is seen within the lateral aspect of the liver dome
(axial CT image 17, CT series number 2). An ill-defined, slightly
exophytic 6.9 cm x 5.0 cm area of heterogeneous mildly decreased
attenuation is seen within the posterior aspect of the right lobe of
the liver. An ill-defined central area of low attenuation is noted
within this region. No gallstones, gallbladder wall thickening, or
biliary dilatation.

Pancreas: Unremarkable. No pancreatic ductal dilatation or
surrounding inflammatory changes.

Spleen: Normal in size without focal abnormality.

Adrenals/Urinary Tract: Adrenal glands are unremarkable. Kidneys are
normal in size, without focal lesions. Marked severity bilateral
hydronephrosis and hydroureter is seen. The urinary bladder is
partially contracted and subsequently limited in evaluation.

Stomach/Bowel: Stomach is within normal limits. Appendix appears
normal. No evidence of bowel dilatation. There is marked severity
thickening of the mid and distal sigmoid colon associated
pericolonic inflammatory fat stranding is noted.

Vascular/Lymphatic: No significant vascular findings are present.

There is moderate severity para-aortic and aortocaval
lymphadenopathy. The largest lymph node measures approximately
cm x 1.7 cm along the para-aortic region at the level of the kidney.
It should be noted that this area is limited in evaluation in the
absence of intravenous contrast.

A 2.8 cm x 2.4 cm heterogeneous soft tissue mass is seen along the
posterior aspect of the distal left iliac vessels (axial CT images
68 through 74, CT series number 2).

Numerous subcentimeter soft tissue nodules are seen within the
mesentery of the pelvis, along the midline (axial CT images 58
through 66, CT series number 2). Numerous additional small soft
tissue nodules are seen surrounding the distal sigmoid colon and
rectosigmoid junction.

Reproductive: The uterus is mildly enlarged. The uterine fundus is
positioned to the left of midline within the anterior aspect of the
pelvis.

Other: No abdominal wall hernia or abnormality. A small amount of
abdominal and pelvic fluid is noted.

Musculoskeletal: No acute or significant osseous findings.
IMPRESSION: 1. Marked severity sigmoid colitis. Due to the numerous areas of
soft tissue nodularity within the mesentery in this region, an
underlying neoplastic process cannot be excluded. Correlation with
colonoscopy is recommended.
2. Heterogeneous liver masses, as described above, suspicious for an
underlying neoplasm. MRI correlation is recommended.
3. Moderate severity para-aortic and aortocaval lymphadenopathy with
an additional enlarged, irregular appearing lymph node seen along
the posterior aspect of the distal left iliac vessels. This is also
worrisome for sequelae associated with an underlying neoplasm.
4. Marked severity bilateral hydronephrosis and hydroureter which
likely represents partial renal obstruction secondary to the
inflammatory and suspected neoplastic process seen within the
pelvis.

## 2022-02-05 NOTE — Telephone Encounter (Signed)
Per IR scheduling, exchange scheduled for Mon 7/17 at 10a with an arrival time of 9:30a.

## 2022-02-05 NOTE — Telephone Encounter (Signed)
Message sent to IR scheduling for Bilateral exchange, orders placed

## 2022-02-07 IMAGING — DX DG CHEST 1V
1 series · 1 of 1 positions shown · non-contrast
Comparison: May 31, 2020

CLINICAL DATA: Follow-up left pneumothorax.

EXAM:
CHEST  1 VIEW

[chest ap]
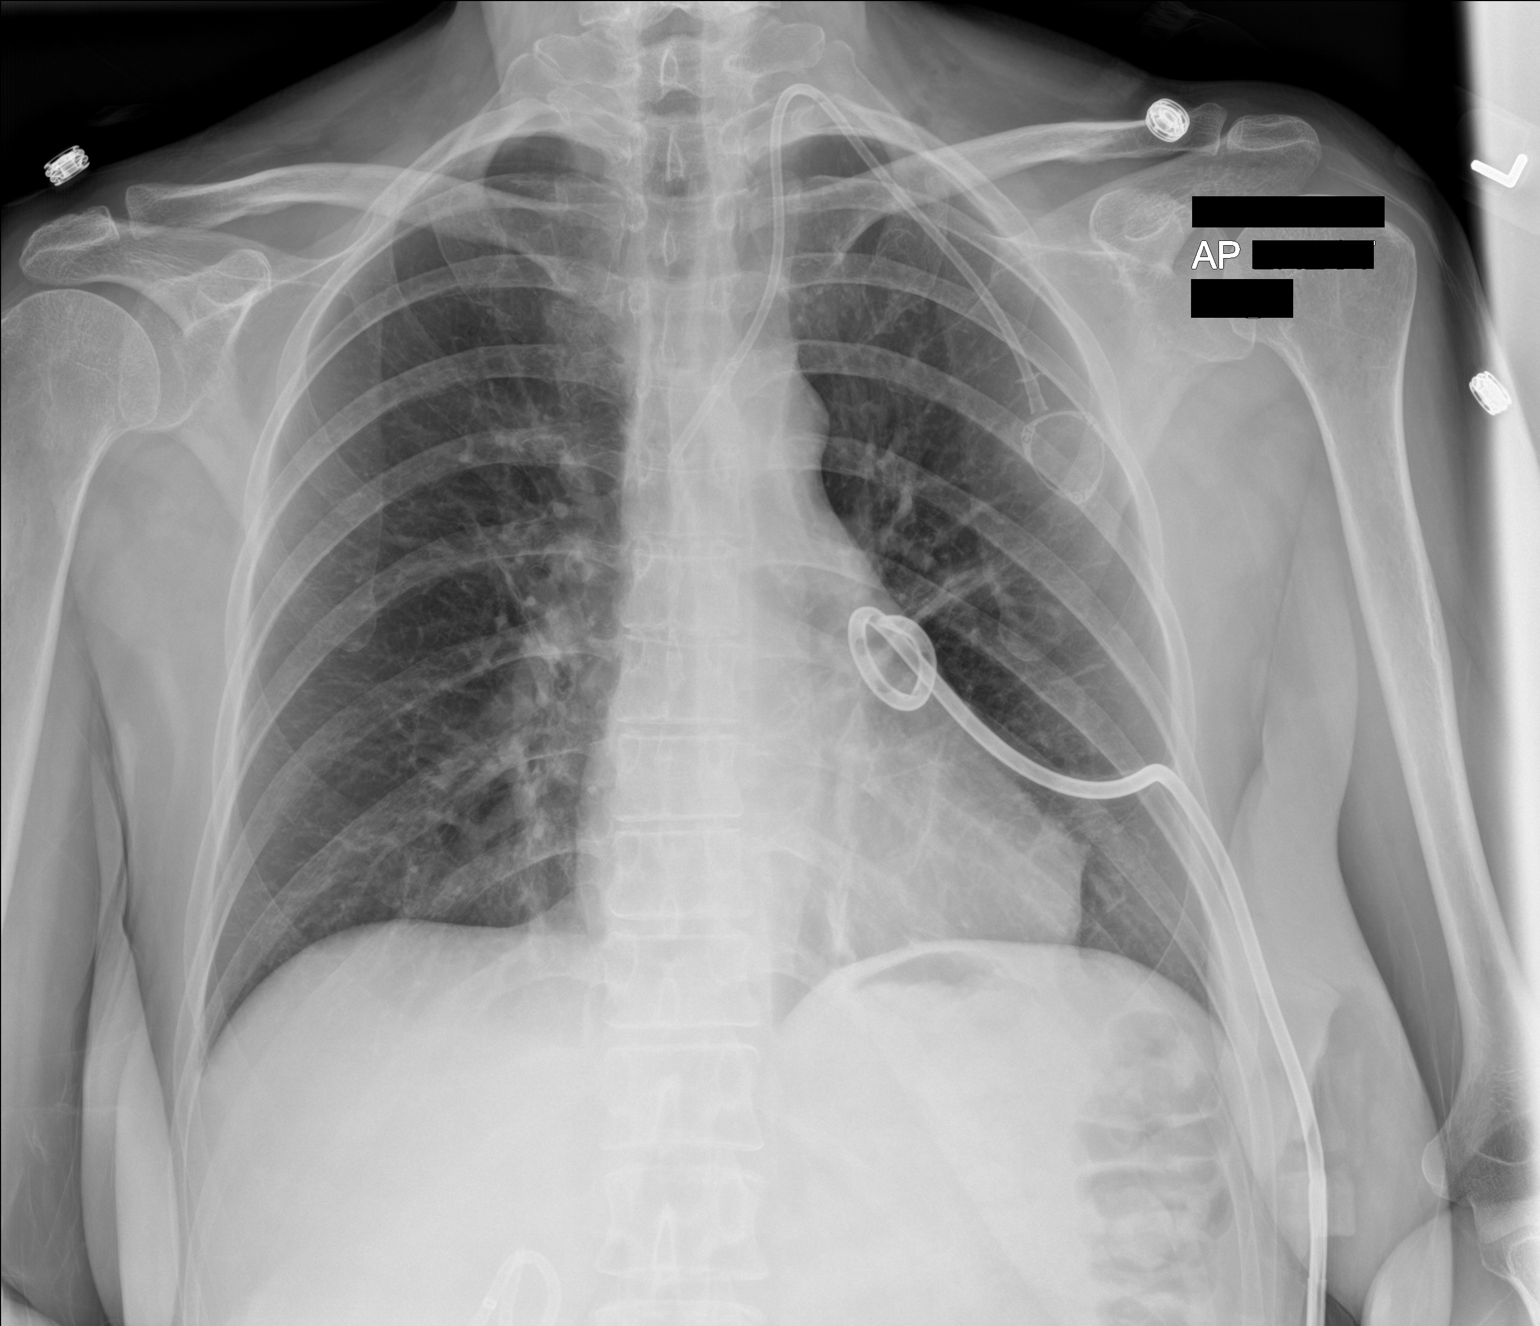

[1 of 1 positions shown; findings below may reference images not displayed]

FINDINGS: Left-sided injectable port in stable position. Left chest tube in
stable position.

Cardiomediastinal silhouette is normal. Mediastinal contours appear
intact.

There is no evidence of focal airspace consolidation, pleural
effusion. No radiographically apparent pneumothorax.

Osseous structures are without acute abnormality. Soft tissues are
grossly normal.
IMPRESSION: 1. No radiographically apparent pneumothorax.
2. Left chest tube in stable position.

## 2022-02-15 ENCOUNTER — Ambulatory Visit: Admission: RE | Admit: 2022-02-15 | Payer: Medicaid Other | Source: Ambulatory Visit | Admitting: Radiology

## 2022-03-02 DEATH — deceased
